# Patient Record
Sex: Female | Born: 1965 | Race: Black or African American | Hispanic: No | Marital: Married | Smoking: Former smoker
Health system: Southern US, Community
[De-identification: ages and names within clinical notes are randomized; demographics above are authoritative.]

## PROBLEM LIST (undated history)

## (undated) DIAGNOSIS — Z9221 Personal history of antineoplastic chemotherapy: Secondary | ICD-10-CM

## (undated) DIAGNOSIS — E119 Type 2 diabetes mellitus without complications: Secondary | ICD-10-CM

## (undated) DIAGNOSIS — N979 Female infertility, unspecified: Secondary | ICD-10-CM

## (undated) DIAGNOSIS — R002 Palpitations: Secondary | ICD-10-CM

## (undated) DIAGNOSIS — I10 Essential (primary) hypertension: Secondary | ICD-10-CM

## (undated) DIAGNOSIS — E7800 Pure hypercholesterolemia, unspecified: Secondary | ICD-10-CM

## (undated) DIAGNOSIS — D500 Iron deficiency anemia secondary to blood loss (chronic): Secondary | ICD-10-CM

## (undated) DIAGNOSIS — R000 Tachycardia, unspecified: Secondary | ICD-10-CM

## (undated) DIAGNOSIS — C8307 Small cell B-cell lymphoma, spleen: Secondary | ICD-10-CM

## (undated) DIAGNOSIS — I219 Acute myocardial infarction, unspecified: Secondary | ICD-10-CM

## (undated) DIAGNOSIS — C8590 Non-Hodgkin lymphoma, unspecified, unspecified site: Secondary | ICD-10-CM

## (undated) DIAGNOSIS — D630 Anemia in neoplastic disease: Secondary | ICD-10-CM

## (undated) DIAGNOSIS — D72829 Elevated white blood cell count, unspecified: Secondary | ICD-10-CM

## (undated) DIAGNOSIS — R5383 Other fatigue: Secondary | ICD-10-CM

## (undated) DIAGNOSIS — Z955 Presence of coronary angioplasty implant and graft: Secondary | ICD-10-CM

## (undated) HISTORY — DX: Iron deficiency anemia secondary to blood loss (chronic): D50.0

## (undated) HISTORY — DX: Palpitations: R00.2

## (undated) HISTORY — DX: Elevated white blood cell count, unspecified: D72.829

## (undated) HISTORY — PX: WISDOM TOOTH EXTRACTION: SHX21

## (undated) HISTORY — DX: Essential (primary) hypertension: I10

## (undated) HISTORY — PX: BONE MARROW BIOPSY: SHX199

## (undated) HISTORY — DX: Other fatigue: R53.83

## (undated) HISTORY — DX: Type 2 diabetes mellitus without complications: E11.9

## (undated) HISTORY — DX: Presence of coronary angioplasty implant and graft: Z95.5

## (undated) HISTORY — DX: Anemia in neoplastic disease: D63.0

## (undated) HISTORY — DX: Female infertility, unspecified: N97.9

## (undated) HISTORY — DX: Small cell b-cell lymphoma, spleen: C83.07

## (undated) HISTORY — DX: Tachycardia, unspecified: R00.0

---

## 2007-08-12 ENCOUNTER — Emergency Department (HOSPITAL_COMMUNITY): Admission: EM | Admit: 2007-08-12 | Discharge: 2007-08-12 | Payer: Self-pay | Admitting: Emergency Medicine

## 2007-08-12 ENCOUNTER — Ambulatory Visit: Payer: Self-pay | Admitting: Internal Medicine

## 2007-08-14 ENCOUNTER — Ambulatory Visit: Payer: Self-pay | Admitting: Infectious Disease

## 2007-08-14 ENCOUNTER — Inpatient Hospital Stay (HOSPITAL_COMMUNITY): Admission: EM | Admit: 2007-08-14 | Discharge: 2007-08-18 | Payer: Self-pay | Admitting: Emergency Medicine

## 2007-08-20 ENCOUNTER — Ambulatory Visit: Payer: Self-pay | Admitting: *Deleted

## 2007-08-20 ENCOUNTER — Encounter (INDEPENDENT_AMBULATORY_CARE_PROVIDER_SITE_OTHER): Payer: Self-pay | Admitting: Internal Medicine

## 2007-08-20 ENCOUNTER — Encounter (INDEPENDENT_AMBULATORY_CARE_PROVIDER_SITE_OTHER): Payer: Self-pay | Admitting: *Deleted

## 2007-08-20 ENCOUNTER — Ambulatory Visit: Payer: Self-pay | Admitting: Internal Medicine

## 2007-08-20 DIAGNOSIS — B009 Herpesviral infection, unspecified: Secondary | ICD-10-CM | POA: Insufficient documentation

## 2007-08-20 DIAGNOSIS — Z888 Allergy status to other drugs, medicaments and biological substances status: Secondary | ICD-10-CM | POA: Insufficient documentation

## 2007-08-20 DIAGNOSIS — E1165 Type 2 diabetes mellitus with hyperglycemia: Secondary | ICD-10-CM | POA: Insufficient documentation

## 2007-08-20 LAB — CONVERTED CEMR LAB
Blood Glucose, Fingerstick: 103
Blood Glucose, Home Monitor: 1 mg/dL
GC Probe Amp, Urine: NEGATIVE

## 2007-08-24 ENCOUNTER — Telehealth (INDEPENDENT_AMBULATORY_CARE_PROVIDER_SITE_OTHER): Payer: Self-pay | Admitting: *Deleted

## 2007-08-26 ENCOUNTER — Telehealth (INDEPENDENT_AMBULATORY_CARE_PROVIDER_SITE_OTHER): Payer: Self-pay | Admitting: *Deleted

## 2007-09-03 ENCOUNTER — Encounter (INDEPENDENT_AMBULATORY_CARE_PROVIDER_SITE_OTHER): Payer: Self-pay | Admitting: *Deleted

## 2008-02-17 ENCOUNTER — Other Ambulatory Visit: Admission: RE | Admit: 2008-02-17 | Discharge: 2008-02-17 | Payer: Self-pay | Admitting: Family Medicine

## 2008-05-30 ENCOUNTER — Encounter: Admission: RE | Admit: 2008-05-30 | Discharge: 2008-05-30 | Payer: Self-pay | Admitting: Family Medicine

## 2009-01-03 ENCOUNTER — Ambulatory Visit: Payer: Self-pay | Admitting: Oncology

## 2009-01-11 DIAGNOSIS — C8307 Small cell B-cell lymphoma, spleen: Secondary | ICD-10-CM | POA: Insufficient documentation

## 2009-01-11 HISTORY — DX: Small cell b-cell lymphoma, spleen: C83.07

## 2009-01-12 ENCOUNTER — Other Ambulatory Visit: Admission: RE | Admit: 2009-01-12 | Discharge: 2009-01-12 | Payer: Self-pay | Admitting: Oncology

## 2009-01-12 ENCOUNTER — Encounter: Payer: Self-pay | Admitting: Oncology

## 2009-01-12 LAB — LACTATE DEHYDROGENASE: LDH: 188 U/L (ref 94–250)

## 2009-01-12 LAB — CBC WITH DIFFERENTIAL/PLATELET
Basophils Absolute: 0 10*3/uL (ref 0.0–0.1)
EOS%: 0.6 % (ref 0.0–7.0)
HGB: 10 g/dL — ABNORMAL LOW (ref 11.6–15.9)
LYMPH%: 58.4 % — ABNORMAL HIGH (ref 14.0–49.7)
MCH: 22 pg — ABNORMAL LOW (ref 25.1–34.0)
MCV: 69.1 fL — ABNORMAL LOW (ref 79.5–101.0)
MONO%: 4.4 % (ref 0.0–14.0)
Platelets: 261 10*3/uL (ref 145–400)
RBC: 4.55 10*6/uL (ref 3.70–5.45)
RDW: 17.7 % — ABNORMAL HIGH (ref 11.2–14.5)

## 2009-01-12 LAB — COMPREHENSIVE METABOLIC PANEL
Alkaline Phosphatase: 79 U/L (ref 39–117)
BUN: 8 mg/dL (ref 6–23)
Creatinine, Ser: 0.68 mg/dL (ref 0.40–1.20)
Glucose, Bld: 153 mg/dL — ABNORMAL HIGH (ref 70–99)
Sodium: 141 mEq/L (ref 135–145)
Total Bilirubin: 0.4 mg/dL (ref 0.3–1.2)
Total Protein: 6.5 g/dL (ref 6.0–8.3)

## 2009-01-12 LAB — RETICULOCYTES: RBC: 4.53 10*6/uL (ref 3.70–5.45)

## 2009-01-12 LAB — ERYTHROCYTE SEDIMENTATION RATE: Sed Rate: 32 mm/hr — ABNORMAL HIGH (ref 0–30)

## 2009-01-16 LAB — FERRITIN: Ferritin: 18 ng/mL (ref 10–291)

## 2009-01-16 LAB — SPEP & IFE WITH QIG
Albumin ELP: 56.3 % (ref 55.8–66.1)
Alpha-1-Globulin: 5.7 % — ABNORMAL HIGH (ref 2.9–4.9)
Beta 2: 5 % (ref 3.2–6.5)
Beta Globulin: 7 % (ref 4.7–7.2)
IgM, Serum: 363 mg/dL — ABNORMAL HIGH (ref 60–263)

## 2009-01-16 LAB — IRON AND TIBC
TIBC: 320 ug/dL (ref 250–470)
UIBC: 308 ug/dL

## 2009-01-16 LAB — ANA: Anti Nuclear Antibody(ANA): NEGATIVE

## 2009-01-17 ENCOUNTER — Encounter: Payer: Self-pay | Admitting: Oncology

## 2009-01-17 ENCOUNTER — Other Ambulatory Visit: Admission: RE | Admit: 2009-01-17 | Discharge: 2009-01-17 | Payer: Self-pay | Admitting: Oncology

## 2009-01-17 LAB — CBC WITH DIFFERENTIAL/PLATELET
Basophils Absolute: 0.1 10*3/uL (ref 0.0–0.1)
EOS%: 0.9 % (ref 0.0–7.0)
HCT: 31.2 % — ABNORMAL LOW (ref 34.8–46.6)
HGB: 10 g/dL — ABNORMAL LOW (ref 11.6–15.9)
MCH: 21.8 pg — ABNORMAL LOW (ref 25.1–34.0)
NEUT%: 36.6 % — ABNORMAL LOW (ref 38.4–76.8)
lymph#: 10.5 10*3/uL — ABNORMAL HIGH (ref 0.9–3.3)

## 2009-01-17 LAB — URIC ACID: Uric Acid, Serum: 5.9 mg/dL (ref 2.4–7.0)

## 2009-01-19 ENCOUNTER — Ambulatory Visit (HOSPITAL_COMMUNITY): Admission: RE | Admit: 2009-01-19 | Discharge: 2009-01-19 | Payer: Self-pay | Admitting: Oncology

## 2009-01-22 ENCOUNTER — Encounter: Payer: Self-pay | Admitting: Oncology

## 2009-01-22 ENCOUNTER — Ambulatory Visit: Payer: Self-pay

## 2009-02-19 ENCOUNTER — Ambulatory Visit: Payer: Self-pay | Admitting: Oncology

## 2009-02-19 LAB — CBC WITH DIFFERENTIAL/PLATELET
Basophils Absolute: 0.1 10*3/uL (ref 0.0–0.1)
EOS%: 0.7 % (ref 0.0–7.0)
HCT: 29.9 % — ABNORMAL LOW (ref 34.8–46.6)
HGB: 9.5 g/dL — ABNORMAL LOW (ref 11.6–15.9)
MCH: 21.6 pg — ABNORMAL LOW (ref 25.1–34.0)
MCV: 68.3 fL — ABNORMAL LOW (ref 79.5–101.0)
MONO%: 4.9 % (ref 0.0–14.0)
NEUT%: 38.2 % — ABNORMAL LOW (ref 38.4–76.8)

## 2009-03-26 LAB — COMPREHENSIVE METABOLIC PANEL
Albumin: 4 g/dL (ref 3.5–5.2)
BUN: 14 mg/dL (ref 6–23)
CO2: 22 mEq/L (ref 19–32)
Calcium: 8.7 mg/dL (ref 8.4–10.5)
Chloride: 105 mEq/L (ref 96–112)
Glucose, Bld: 121 mg/dL — ABNORMAL HIGH (ref 70–99)
Potassium: 4.2 mEq/L (ref 3.5–5.3)
Total Protein: 6.6 g/dL (ref 6.0–8.3)

## 2009-03-26 LAB — CBC WITH DIFFERENTIAL/PLATELET
Basophils Absolute: 0.1 10*3/uL (ref 0.0–0.1)
Eosinophils Absolute: 0.1 10*3/uL (ref 0.0–0.5)
HGB: 10.8 g/dL — ABNORMAL LOW (ref 11.6–15.9)
MCV: 68.8 fL — ABNORMAL LOW (ref 79.5–101.0)
MONO#: 1 10*3/uL — ABNORMAL HIGH (ref 0.1–0.9)
NEUT#: 8 10*3/uL — ABNORMAL HIGH (ref 1.5–6.5)
RDW: 18.5 % — ABNORMAL HIGH (ref 11.2–14.5)
WBC: 20.4 10*3/uL — ABNORMAL HIGH (ref 3.9–10.3)
lymph#: 11.1 10*3/uL — ABNORMAL HIGH (ref 0.9–3.3)

## 2009-04-21 IMAGING — MG MM SCREEN MAMMOGRAM BILATERAL
6 series · 6 of 6 positions shown · non-contrast
Comparison: none

DG SCREEN MAMMOGRAM BILATERAL
Bilateral CC and MLO view(s) were taken.

DIGITAL SCREENING MAMMOGRAM WITH CAD:
There are scattered fibroglandular densities.  There is no dominant mass, architectural distortion 
or calcification to suggest malignancy.

[R CC]
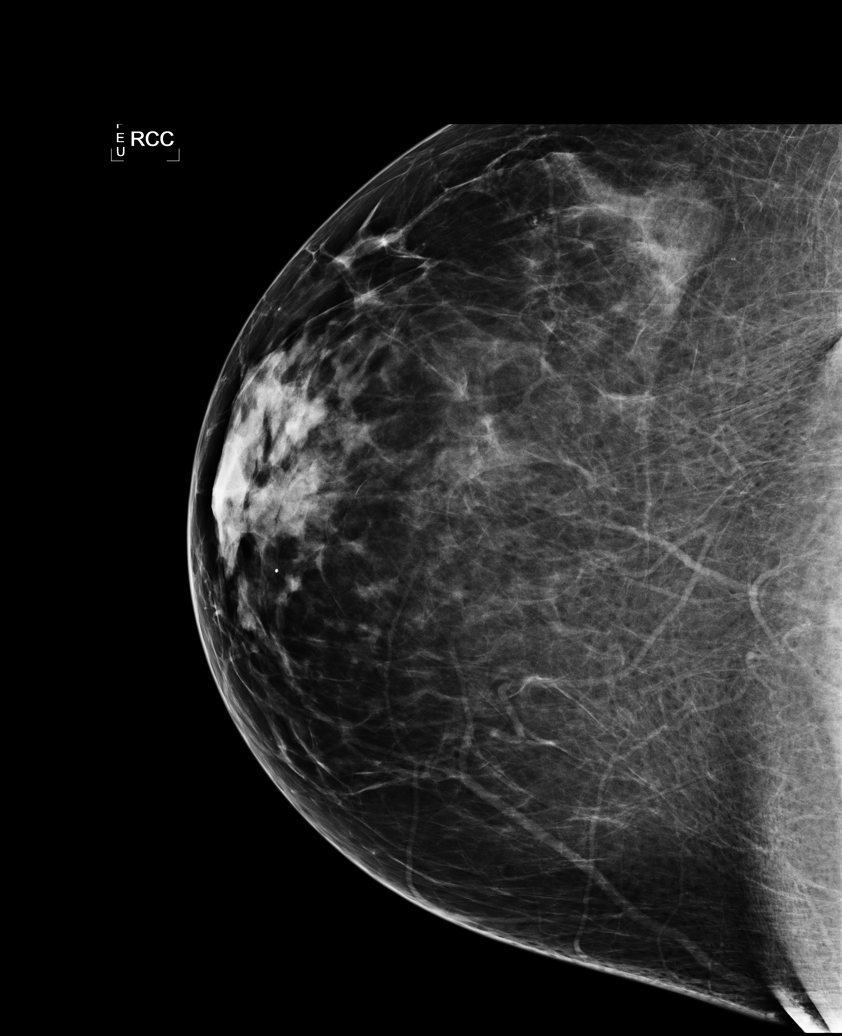

[L CC]
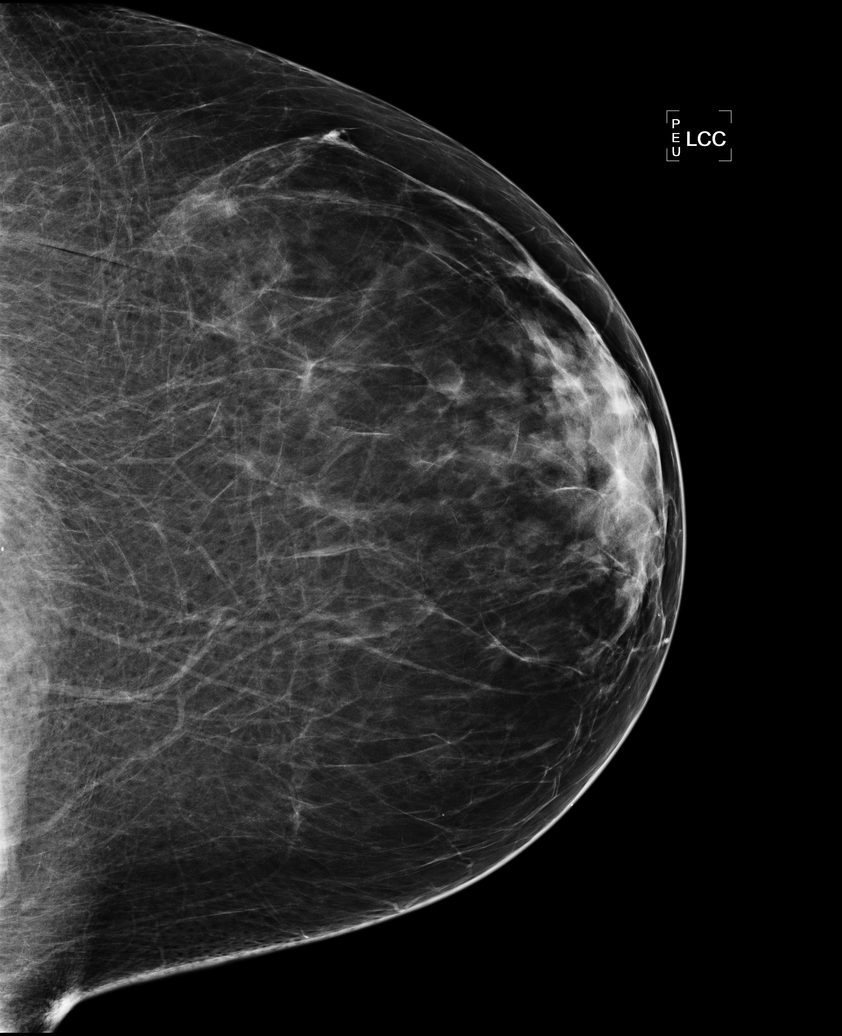

[L MLO (1 of 2)]
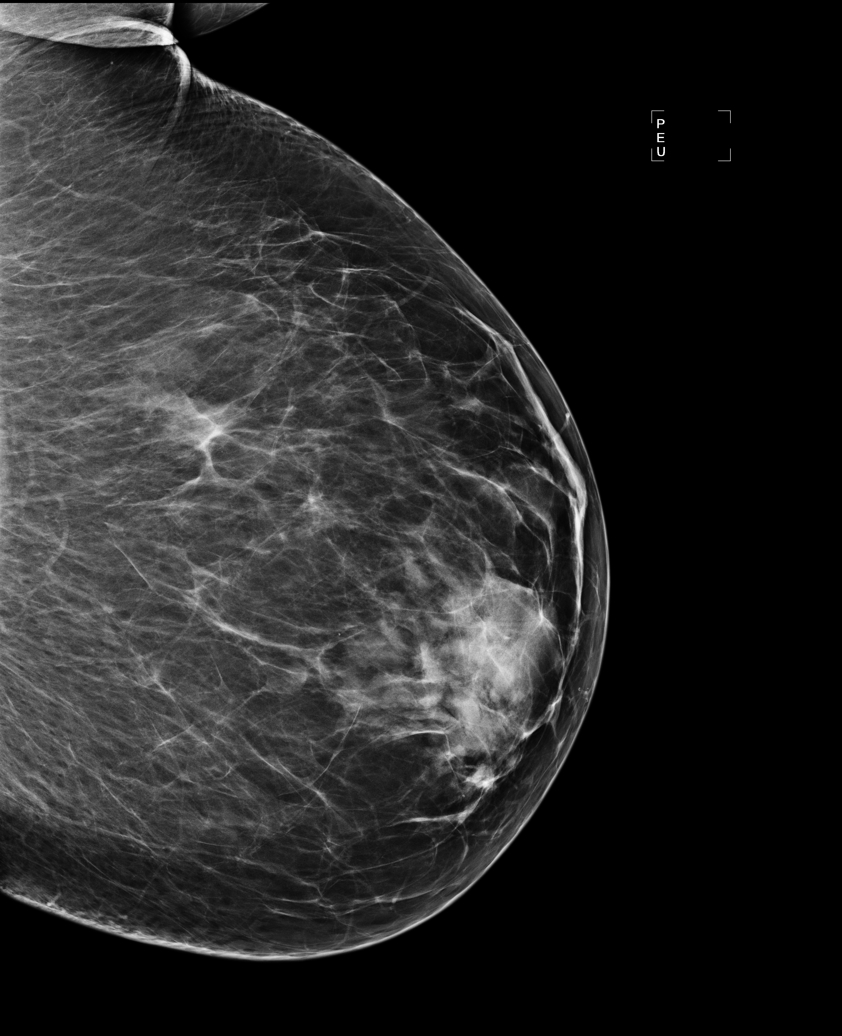

[R MLO (1 of 2)]
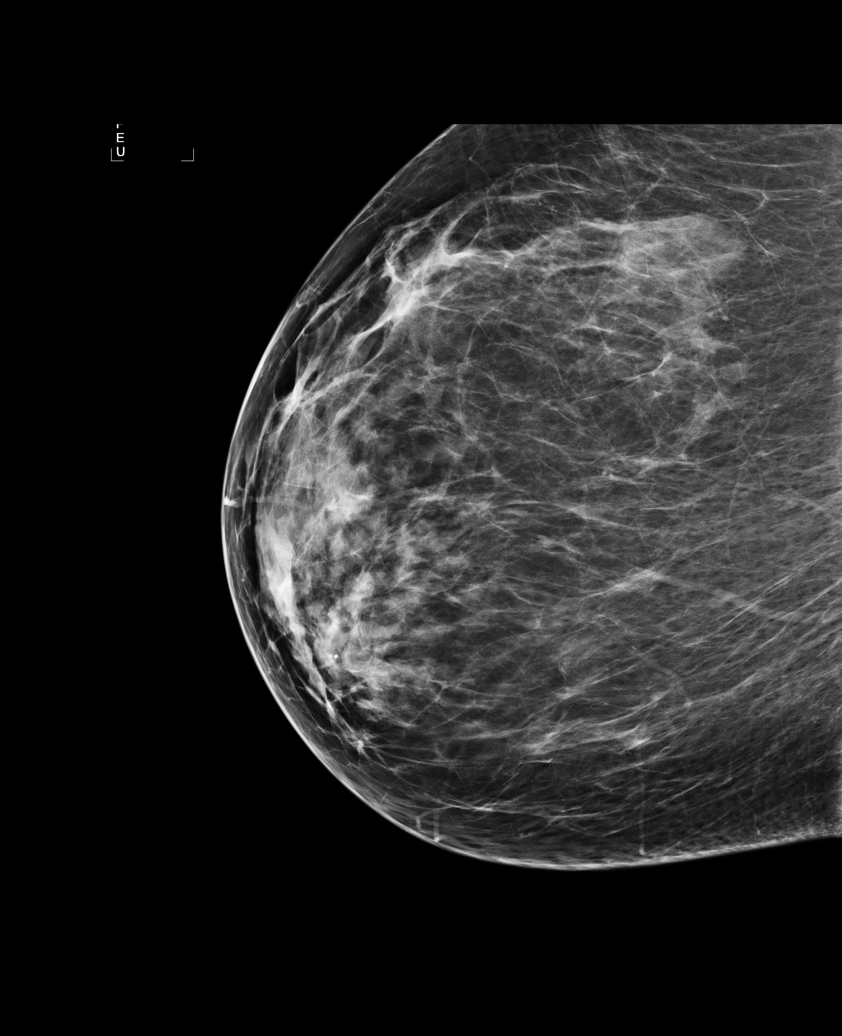

[R MLO (2 of 2)]
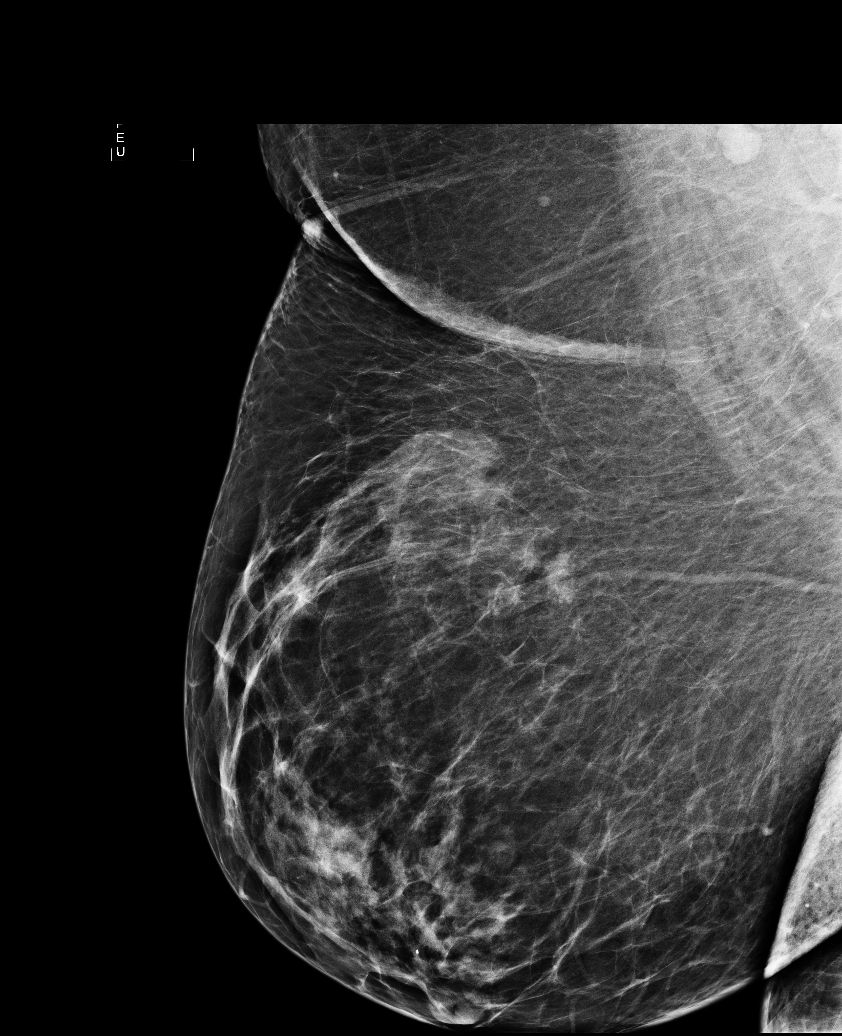

[L MLO (2 of 2)]
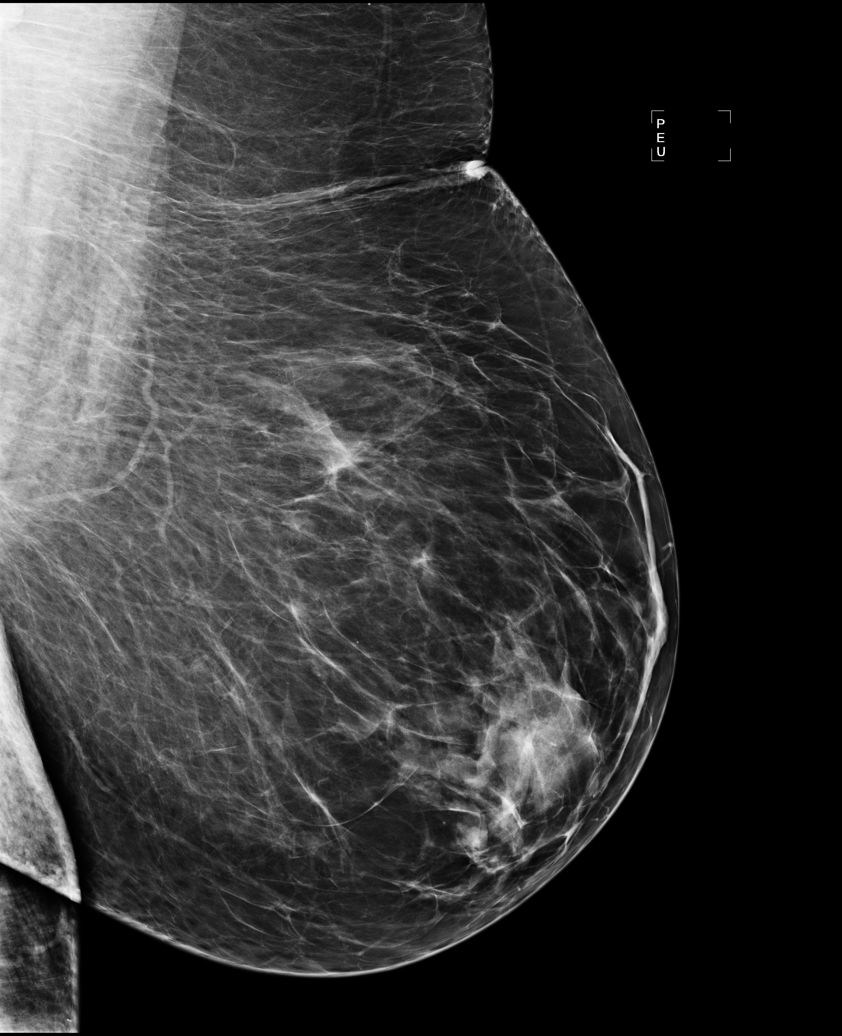

[6 of 6 positions shown; findings below may reference images not displayed]

IMPRESSION: No mammographic evidence of malignancy.  Suggest yearly screening mammography.

ASSESSMENT: Negative - BI-RADS 1

Screening mammogram in 1 year.
ANALYZED BY COMPUTER AIDED DETECTION. , THIS PROCEDURE WAS A DIGITAL MAMMOGRAM.

## 2009-05-21 ENCOUNTER — Ambulatory Visit: Payer: Self-pay | Admitting: Oncology

## 2009-05-23 LAB — CBC WITH DIFFERENTIAL/PLATELET
BASO%: 0.3 % (ref 0.0–2.0)
Basophils Absolute: 0.1 10*3/uL (ref 0.0–0.1)
EOS%: 0.4 % (ref 0.0–7.0)
HGB: 10.1 g/dL — ABNORMAL LOW (ref 11.6–15.9)
LYMPH%: 62.6 % — ABNORMAL HIGH (ref 14.0–49.7)
MCH: 22.2 pg — ABNORMAL LOW (ref 25.1–34.0)
MCV: 69.1 fL — ABNORMAL LOW (ref 79.5–101.0)
RDW: 17.1 % — ABNORMAL HIGH (ref 11.2–14.5)
lymph#: 14.6 10*3/uL — ABNORMAL HIGH (ref 0.9–3.3)

## 2009-06-25 ENCOUNTER — Ambulatory Visit: Payer: Self-pay | Admitting: Oncology

## 2009-06-27 LAB — CBC WITH DIFFERENTIAL/PLATELET
BASO%: 0.3 % (ref 0.0–2.0)
EOS%: 0.5 % (ref 0.0–7.0)
HCT: 31.2 % — ABNORMAL LOW (ref 34.8–46.6)
LYMPH%: 63.3 % — ABNORMAL HIGH (ref 14.0–49.7)
MCH: 22.3 pg — ABNORMAL LOW (ref 25.1–34.0)
MCHC: 32.5 g/dL (ref 31.5–36.0)
MONO#: 1.1 10*3/uL — ABNORMAL HIGH (ref 0.1–0.9)
NEUT%: 30.8 % — ABNORMAL LOW (ref 38.4–76.8)
Platelets: 246 10*3/uL (ref 145–400)
RBC: 4.55 10*6/uL (ref 3.70–5.45)
WBC: 20.6 10*3/uL — ABNORMAL HIGH (ref 3.9–10.3)
lymph#: 13.1 10*3/uL — ABNORMAL HIGH (ref 0.9–3.3)

## 2009-06-27 LAB — COMPREHENSIVE METABOLIC PANEL
ALT: 15 U/L (ref 0–35)
AST: 16 U/L (ref 0–37)
CO2: 28 mEq/L (ref 19–32)
Creatinine, Ser: 0.63 mg/dL (ref 0.40–1.20)
Sodium: 139 mEq/L (ref 135–145)
Total Bilirubin: 0.5 mg/dL (ref 0.3–1.2)
Total Protein: 6.6 g/dL (ref 6.0–8.3)

## 2009-06-27 LAB — LACTATE DEHYDROGENASE: LDH: 172 U/L (ref 94–250)

## 2009-06-27 LAB — FERRITIN: Ferritin: 23 ng/mL (ref 10–291)

## 2009-08-14 ENCOUNTER — Encounter: Admission: RE | Admit: 2009-08-14 | Discharge: 2009-08-14 | Payer: Self-pay | Admitting: Family Medicine

## 2009-08-27 ENCOUNTER — Ambulatory Visit: Payer: Self-pay | Admitting: Oncology

## 2009-08-29 LAB — CBC WITH DIFFERENTIAL/PLATELET
BASO%: 0.3 % (ref 0.0–2.0)
HCT: 32.1 % — ABNORMAL LOW (ref 34.8–46.6)
LYMPH%: 67.3 % — ABNORMAL HIGH (ref 14.0–49.7)
MCH: 22.1 pg — ABNORMAL LOW (ref 25.1–34.0)
MCHC: 31.8 g/dL (ref 31.5–36.0)
MCV: 69.4 fL — ABNORMAL LOW (ref 79.5–101.0)
MONO%: 5.3 % (ref 0.0–14.0)
NEUT%: 26.8 % — ABNORMAL LOW (ref 38.4–76.8)
Platelets: 249 10*3/uL (ref 145–400)
RBC: 4.63 10*6/uL (ref 3.70–5.45)

## 2009-10-25 ENCOUNTER — Ambulatory Visit: Payer: Self-pay | Admitting: Oncology

## 2009-10-30 LAB — COMPREHENSIVE METABOLIC PANEL
BUN: 12 mg/dL (ref 6–23)
CO2: 26 mEq/L (ref 19–32)
Calcium: 9.2 mg/dL (ref 8.4–10.5)
Chloride: 102 mEq/L (ref 96–112)
Creatinine, Ser: 0.7 mg/dL (ref 0.40–1.20)
Glucose, Bld: 114 mg/dL — ABNORMAL HIGH (ref 70–99)
Total Bilirubin: 0.7 mg/dL (ref 0.3–1.2)

## 2009-10-30 LAB — CBC WITH DIFFERENTIAL/PLATELET
Basophils Absolute: 0.1 10*3/uL (ref 0.0–0.1)
HCT: 32.4 % — ABNORMAL LOW (ref 34.8–46.6)
HGB: 10.4 g/dL — ABNORMAL LOW (ref 11.6–15.9)
LYMPH%: 64.8 % — ABNORMAL HIGH (ref 14.0–49.7)
MONO#: 1.4 10*3/uL — ABNORMAL HIGH (ref 0.1–0.9)
NEUT%: 29.3 % — ABNORMAL LOW (ref 38.4–76.8)
Platelets: 250 10*3/uL (ref 145–400)
WBC: 26.5 10*3/uL — ABNORMAL HIGH (ref 3.9–10.3)
lymph#: 17.1 10*3/uL — ABNORMAL HIGH (ref 0.9–3.3)

## 2009-12-11 IMAGING — CT CT PELVIS W/ CM
2 of 4 series · 17 of 46 positions shown, 19 images · IV contrast (agent unspecified)
Comparison: None.

CT CHEST

CLINICAL DATA: Lymphoma.  Leukocytosis.  Recent left iliac bone
marrow biopsy.

CT CHEST, ABDOMEN AND PELVIS WITH CONTRAST
TECHNIQUE: Multidetector CT imaging of the chest, abdomen and
pelvis was performed following the standard protocol during bolus
administration of intravenous contrast.
Contrast: 100 ml [id number]

[Series 2: cap xxl st · axial · 0.68mm/px · z∈[+683,+1293]mm · 14 of 134 slices shown, 16 images]
[im 6/134  soft-tissue]
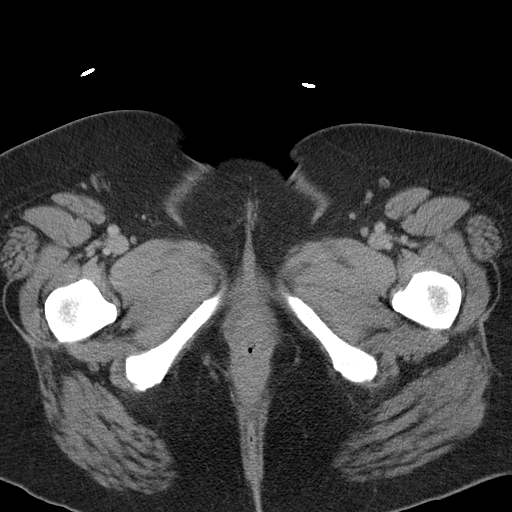
[im 6/134  bone]
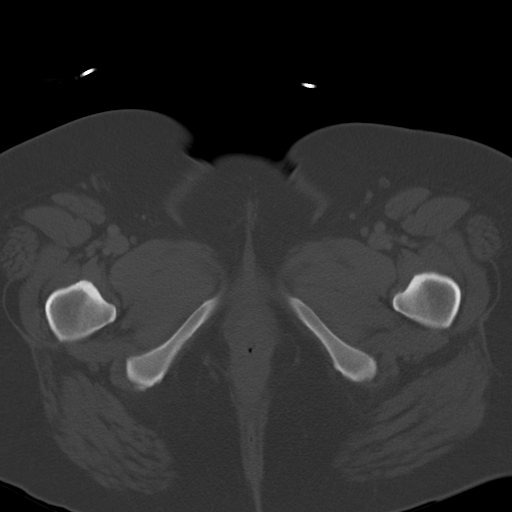
[im 17/134  soft-tissue]
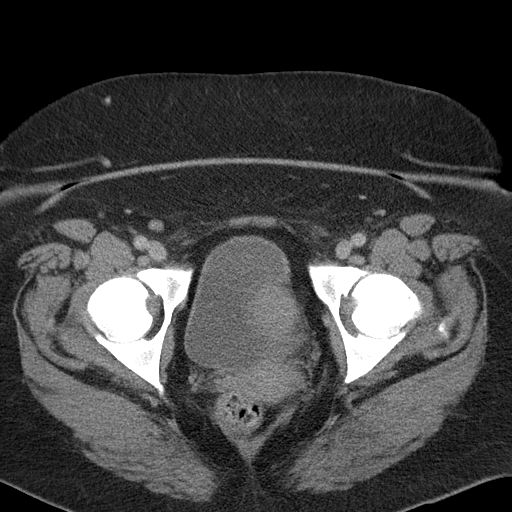
[im 28/134  soft-tissue]
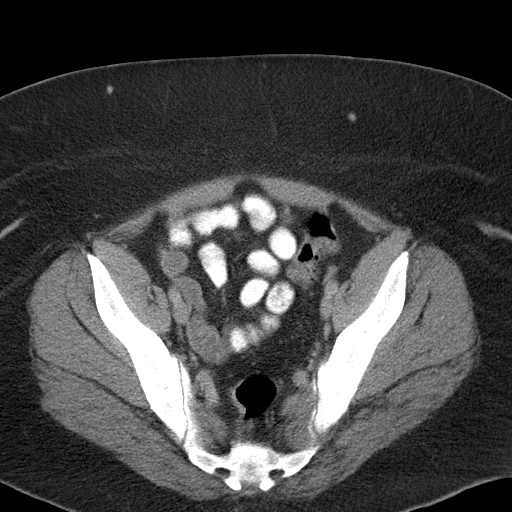
[im 34/134  soft-tissue]
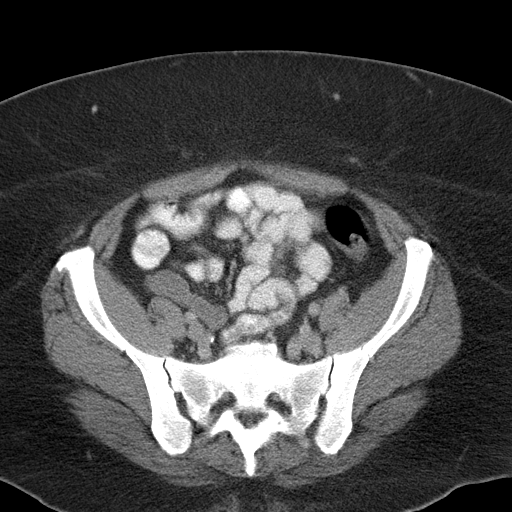
[im 45/134  soft-tissue]
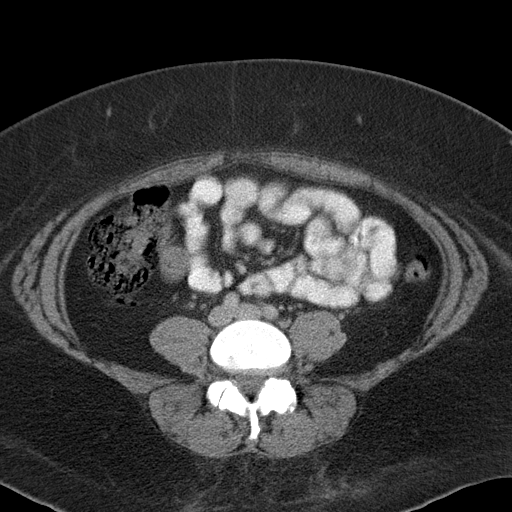
[im 56/134  soft-tissue]
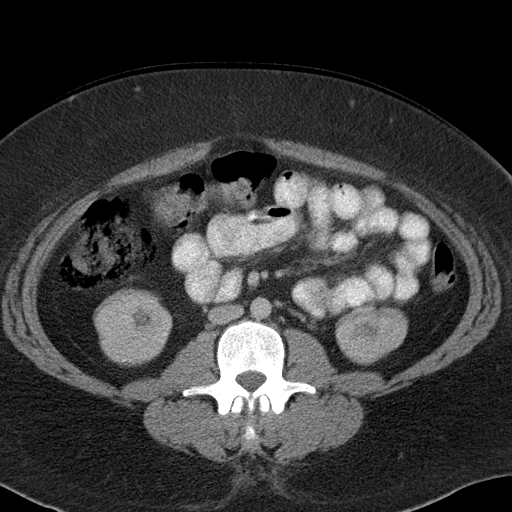
[im 61/134  soft-tissue]
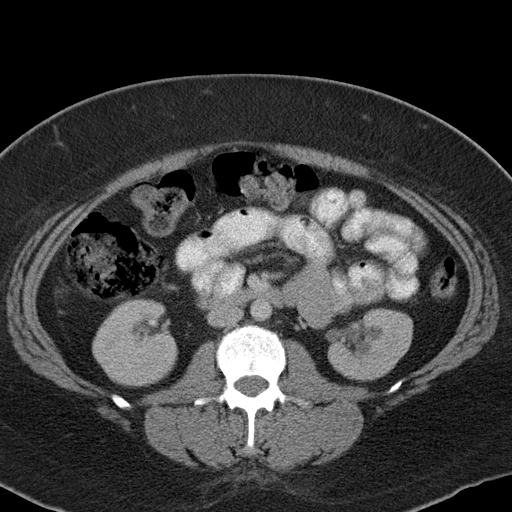
[im 73/134  soft-tissue]
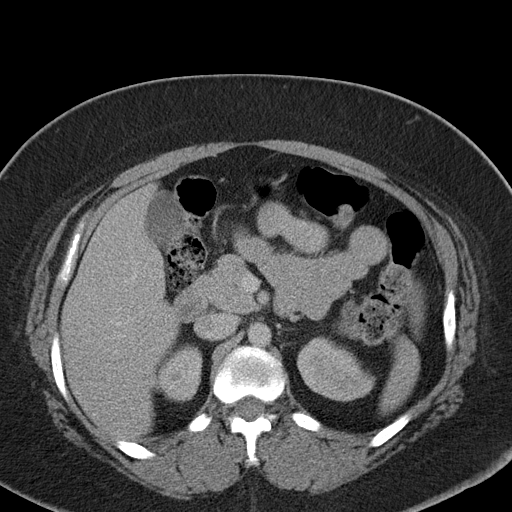
[im 78/134  soft-tissue]
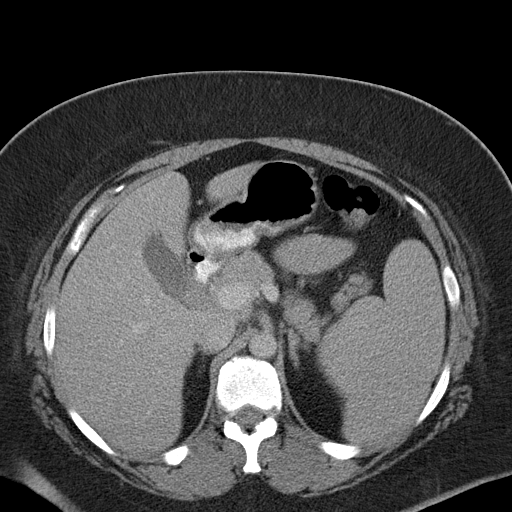
[im 78/134  bone]
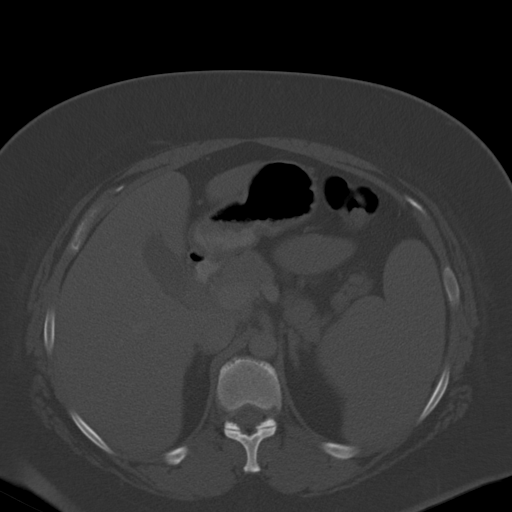
[im 89/134  soft-tissue]
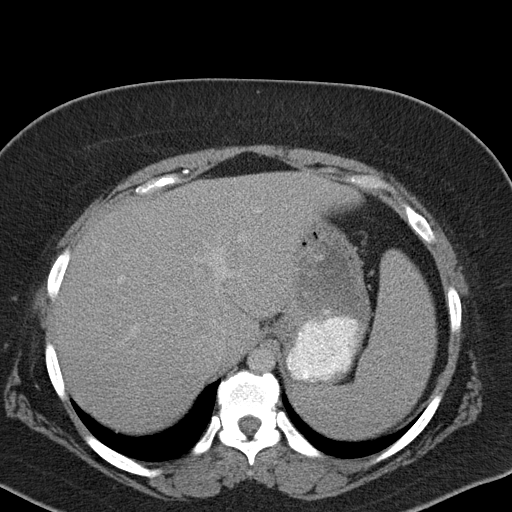
[im 100/134  soft-tissue]
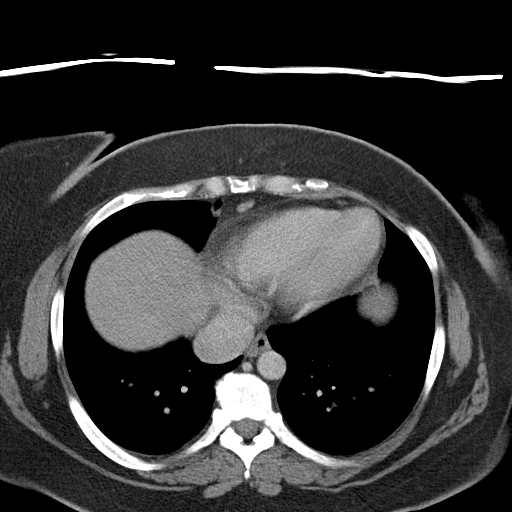
[im 106/134  soft-tissue]
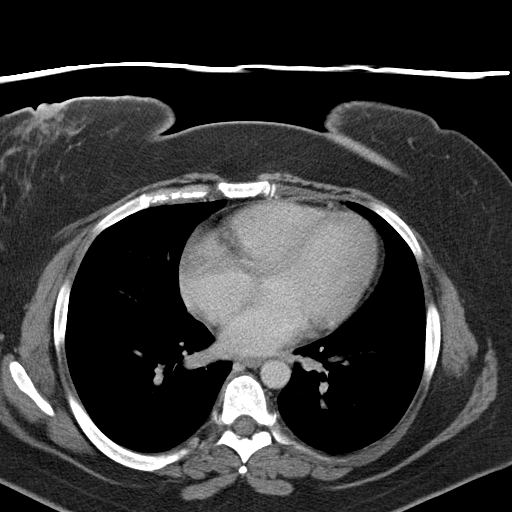
[im 117/134  soft-tissue]
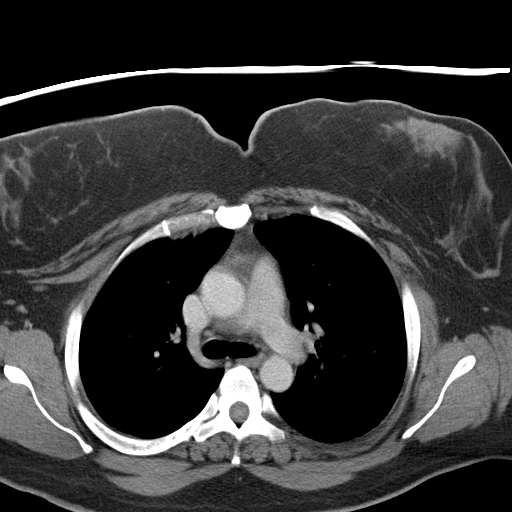
[im 128/134  soft-tissue]
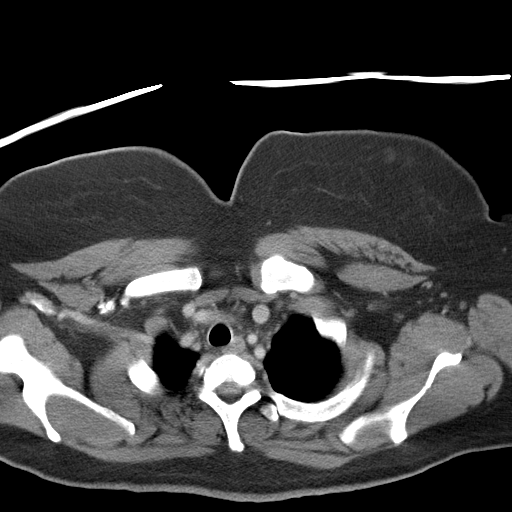

[Series 602: <mpr thick range> · coronal · 1.30mm/px · 3 of 97 slices shown]
[im 33/97  soft-tissue]
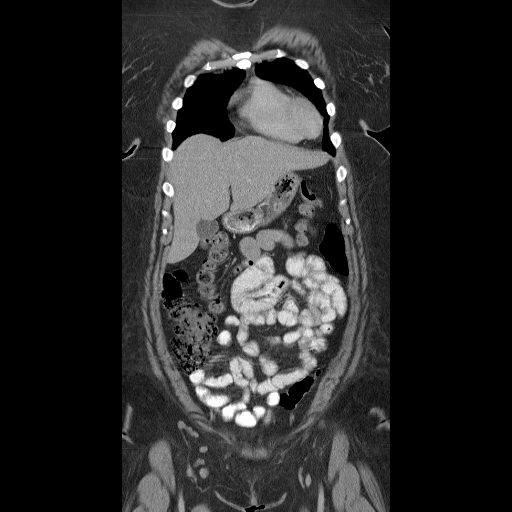
[im 43/97  soft-tissue]
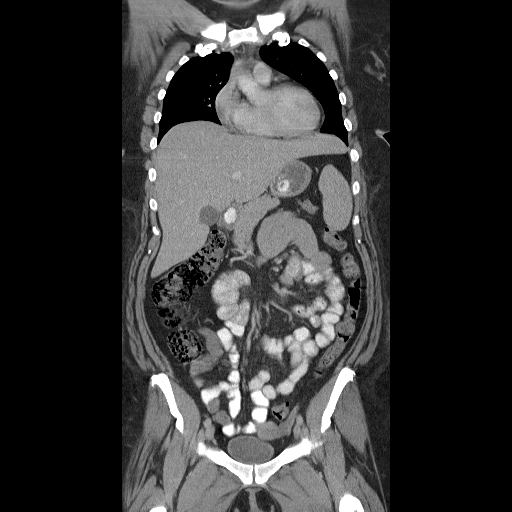
[im 54/97  soft-tissue]
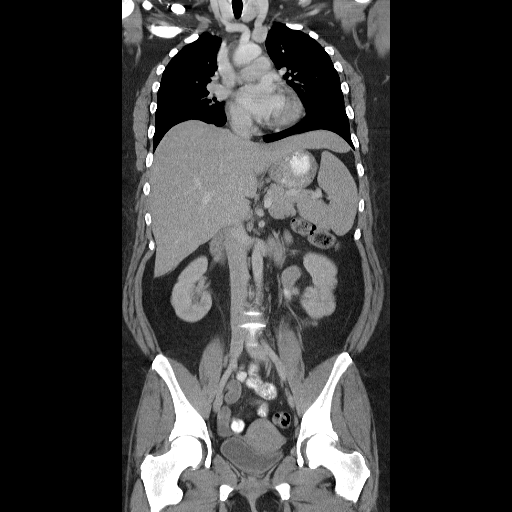

[17 of 46 positions shown; findings below may reference images not displayed]

FINDINGS: Left lobe of the thyroid appears mildly heterogeneous.
No pathologically enlarged mediastinal, hilar or axillary lymph
nodes.  Heart is at the upper limits normal in size.  No
pericardial effusion.

There is minimal subpleural atelectasis and/or scarring in the
right lower lobe.  Lungs otherwise clear.  No pleural fluid.
Airway is unremarkable.
IMPRESSION: 1.  No evidence of lymphoma in the chest.
2.  Question mild heterogeneity of the left lobe of the thyroid.
If further evaluation is desired, ultrasound could be performed.

CT ABDOMEN
FINDINGS: Liver, gallbladder, adrenal glands and kidneys are
unremarkable.  Spleen is at the upper limits of normal in size,
measuring 12.2 cm.  Appears prominent in the anterior and posterior
dimensions, where it measures 15.4 cm.  Pancreas, stomach and small
bowel are unremarkable.  No pathologically enlarged lymph nodes.
IMPRESSION: Splenomegaly.

CT PELVIS
FINDINGS: Uterus and ovaries are visualized.  Colon and appendix
are unremarkable.  No pathologically enlarged lymph nodes.  Largest
lymph node is seen in the right common iliac chain, measuring 6 mm
short axis.  No free fluid.  No worrisome lytic or sclerotic
lesions.
IMPRESSION: No evidence of lymphoma in the pelvis.

## 2010-02-26 ENCOUNTER — Ambulatory Visit: Payer: Self-pay | Admitting: Oncology

## 2010-02-27 LAB — CBC WITH DIFFERENTIAL/PLATELET
BASO%: 0.3 % (ref 0.0–2.0)
Basophils Absolute: 0.1 10*3/uL (ref 0.0–0.1)
EOS%: 0.4 % (ref 0.0–7.0)
HCT: 30.2 % — ABNORMAL LOW (ref 34.8–46.6)
HGB: 9.7 g/dL — ABNORMAL LOW (ref 11.6–15.9)
LYMPH%: 70.9 % — ABNORMAL HIGH (ref 14.0–49.7)
MCH: 22.3 pg — ABNORMAL LOW (ref 25.1–34.0)
MCHC: 32.3 g/dL (ref 31.5–36.0)
MCV: 69.1 fL — ABNORMAL LOW (ref 79.5–101.0)
NEUT%: 23.3 % — ABNORMAL LOW (ref 38.4–76.8)
Platelets: 232 10*3/uL (ref 145–400)
lymph#: 24.8 10*3/uL — ABNORMAL HIGH (ref 0.9–3.3)

## 2010-02-27 LAB — IRON AND TIBC
%SAT: 8 % — ABNORMAL LOW (ref 20–55)
Iron: 25 ug/dL — ABNORMAL LOW (ref 42–145)
TIBC: 327 ug/dL (ref 250–470)

## 2010-02-27 LAB — COMPREHENSIVE METABOLIC PANEL
AST: 13 U/L (ref 0–37)
BUN: 14 mg/dL (ref 6–23)
CO2: 24 mEq/L (ref 19–32)
Calcium: 8.8 mg/dL (ref 8.4–10.5)
Chloride: 103 mEq/L (ref 96–112)
Creatinine, Ser: 0.75 mg/dL (ref 0.40–1.20)
Total Bilirubin: 0.3 mg/dL (ref 0.3–1.2)

## 2010-02-27 LAB — FERRITIN: Ferritin: 24 ng/mL (ref 10–291)

## 2010-05-27 ENCOUNTER — Other Ambulatory Visit: Admission: RE | Admit: 2010-05-27 | Discharge: 2010-05-27 | Payer: Self-pay | Admitting: Family Medicine

## 2010-05-28 ENCOUNTER — Ambulatory Visit: Payer: Self-pay | Admitting: Oncology

## 2010-05-30 LAB — COMPREHENSIVE METABOLIC PANEL
ALT: 17 U/L (ref 0–35)
AST: 18 U/L (ref 0–37)
Alkaline Phosphatase: 71 U/L (ref 39–117)
CO2: 28 mEq/L (ref 19–32)
Creatinine, Ser: 0.65 mg/dL (ref 0.40–1.20)
Sodium: 141 mEq/L (ref 135–145)
Total Bilirubin: 0.6 mg/dL (ref 0.3–1.2)
Total Protein: 6.4 g/dL (ref 6.0–8.3)

## 2010-05-30 LAB — CHCC SMEAR

## 2010-05-30 LAB — CBC WITH DIFFERENTIAL/PLATELET
BASO%: 0.3 % (ref 0.0–2.0)
Basophils Absolute: 0.1 10*3/uL (ref 0.0–0.1)
HCT: 32.4 % — ABNORMAL LOW (ref 34.8–46.6)
HGB: 10.9 g/dL — ABNORMAL LOW (ref 11.6–15.9)
LYMPH%: 69.4 % — ABNORMAL HIGH (ref 14.0–49.7)
MCHC: 33.7 g/dL (ref 31.5–36.0)
MONO#: 1.1 10*3/uL — ABNORMAL HIGH (ref 0.1–0.9)
NEUT%: 25.6 % — ABNORMAL LOW (ref 38.4–76.8)
Platelets: 200 10*3/uL (ref 145–400)
WBC: 24 10*3/uL — ABNORMAL HIGH (ref 3.9–10.3)
lymph#: 16.7 10*3/uL — ABNORMAL HIGH (ref 0.9–3.3)

## 2010-05-30 LAB — MORPHOLOGY: PLT EST: ADEQUATE

## 2010-05-30 LAB — LACTATE DEHYDROGENASE: LDH: 188 U/L (ref 94–250)

## 2010-07-06 IMAGING — MG MM SCREEN MAMMOGRAM BILATERAL
6 series · 6 of 6 positions shown · non-contrast
Comparison: Prior studies.

DG SCREEN MAMMOGRAM BILATERAL
Bilateral CC and MLO view(s) were taken.
Prior study comparison: May 30, 2008, DG screen mammogram bilateral.

DIGITAL SCREENING MAMMOGRAM WITH CAD:

[R CC]
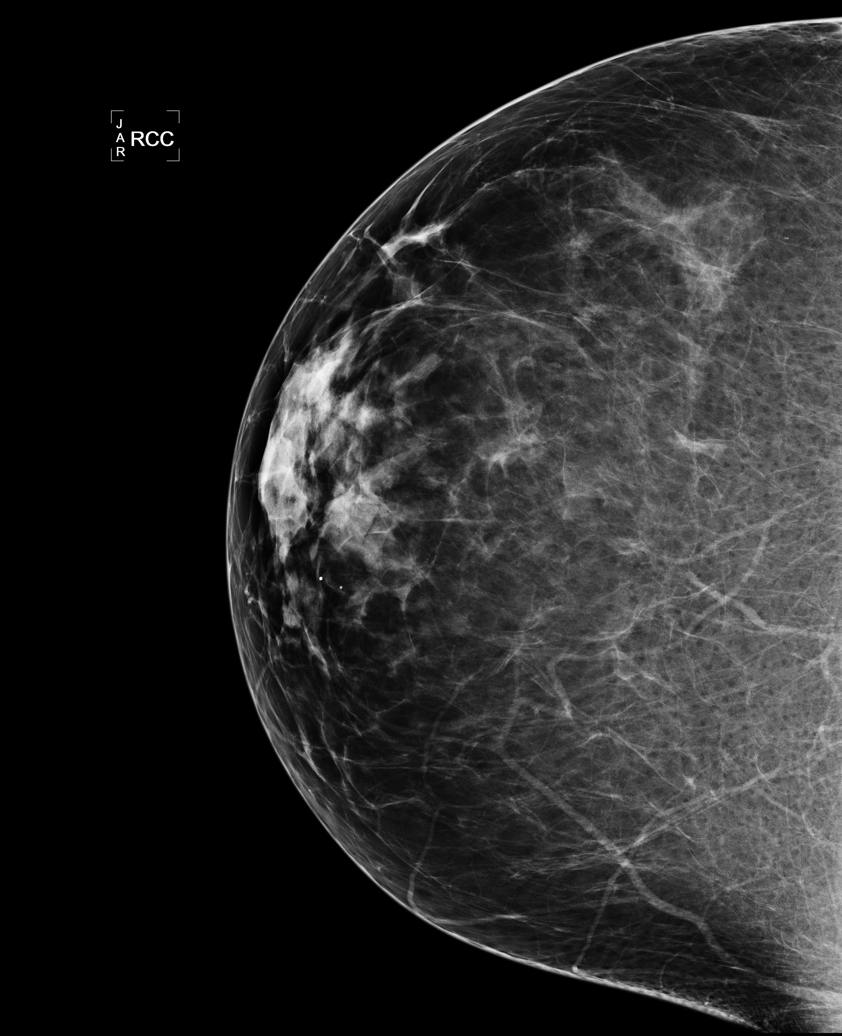

[L CC]
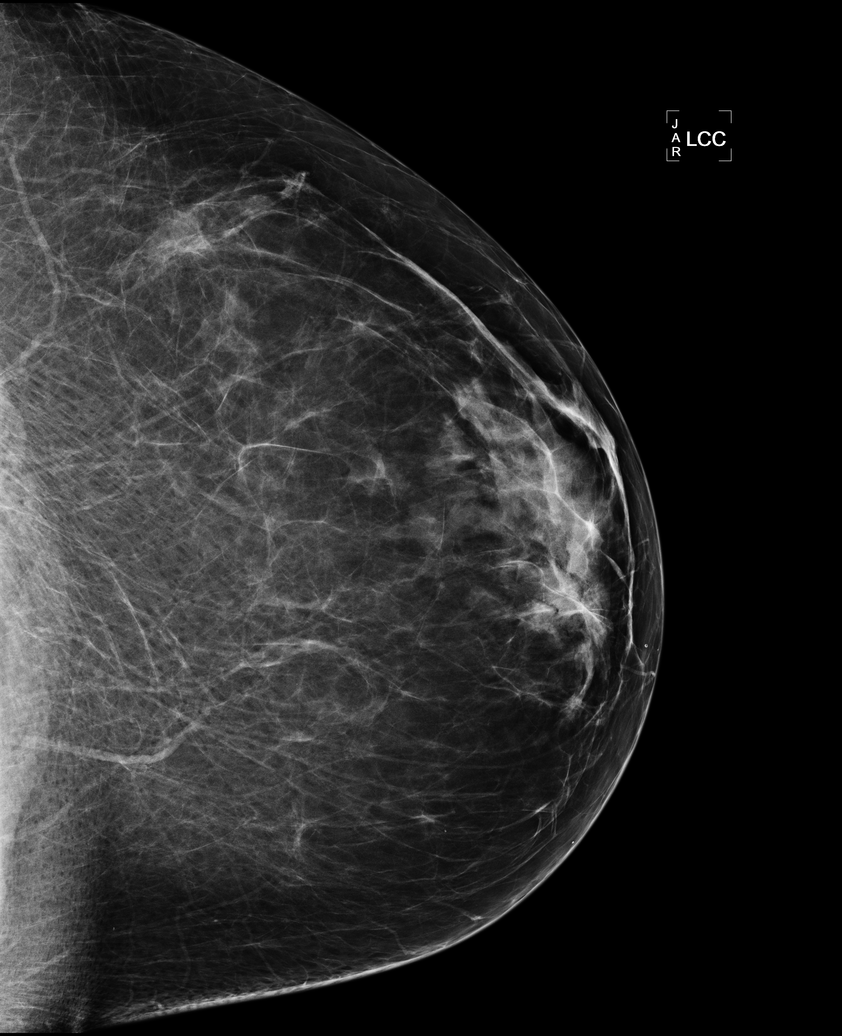

[L MLO (1 of 2)]
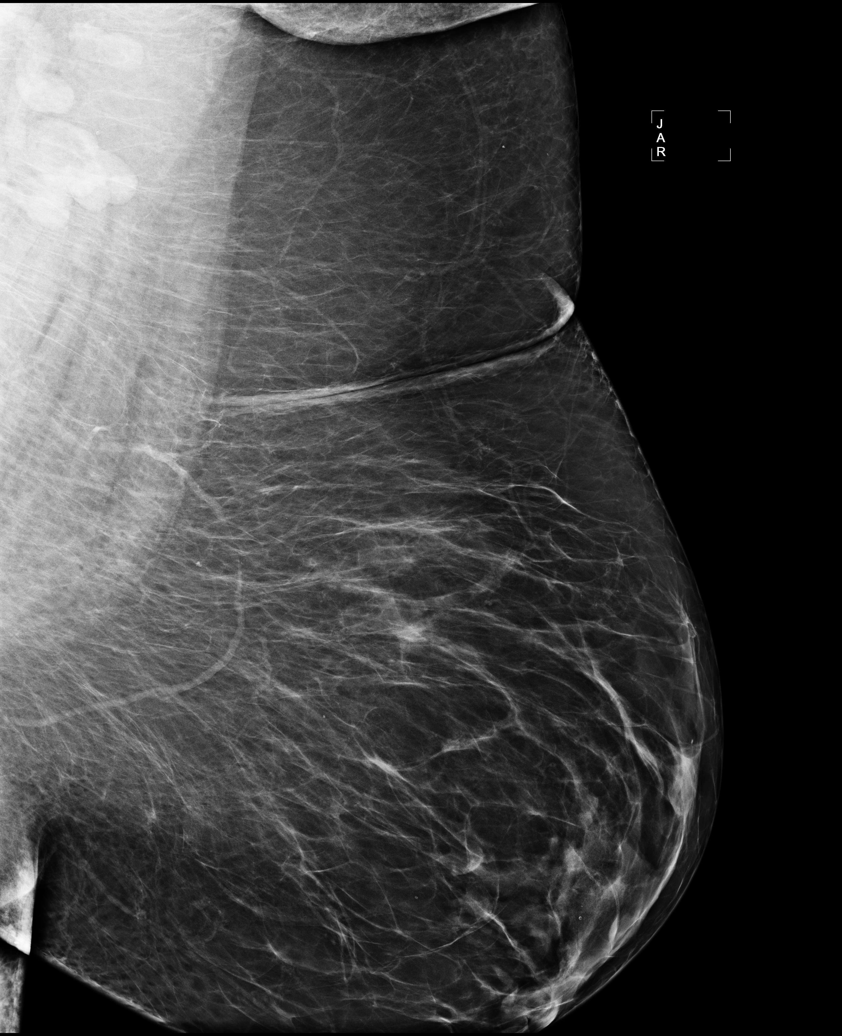

[R MLO (1 of 2)]
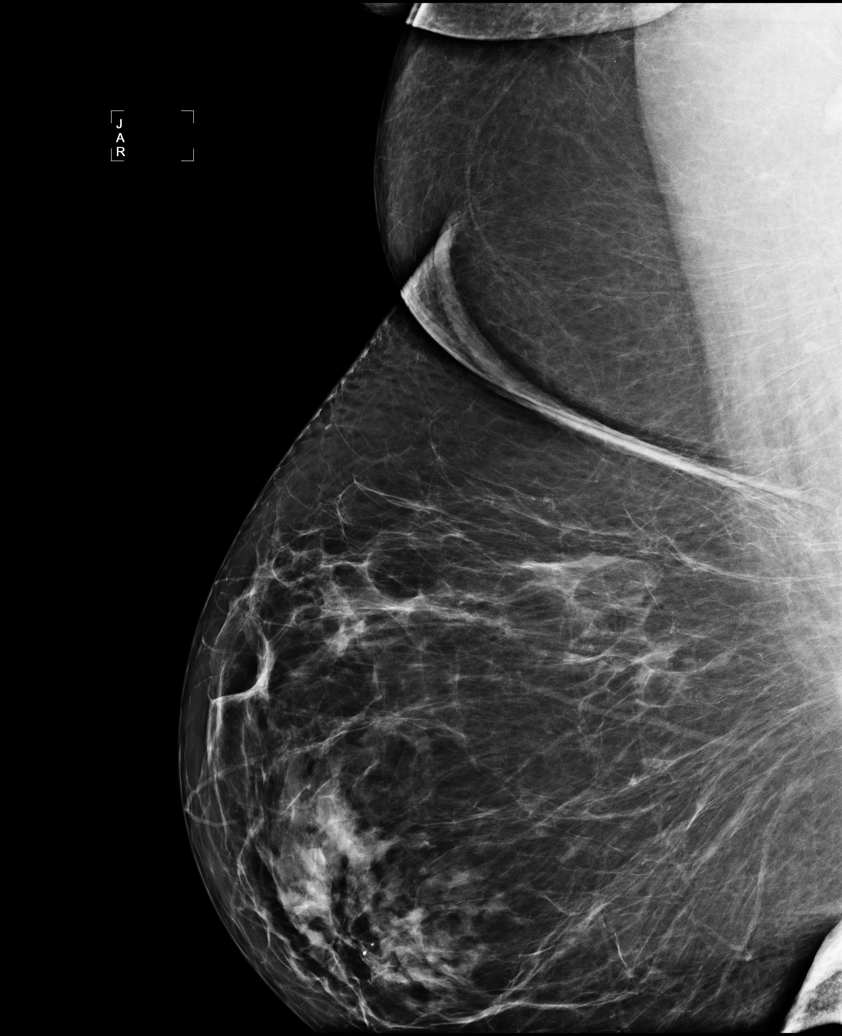

[L MLO (2 of 2)]
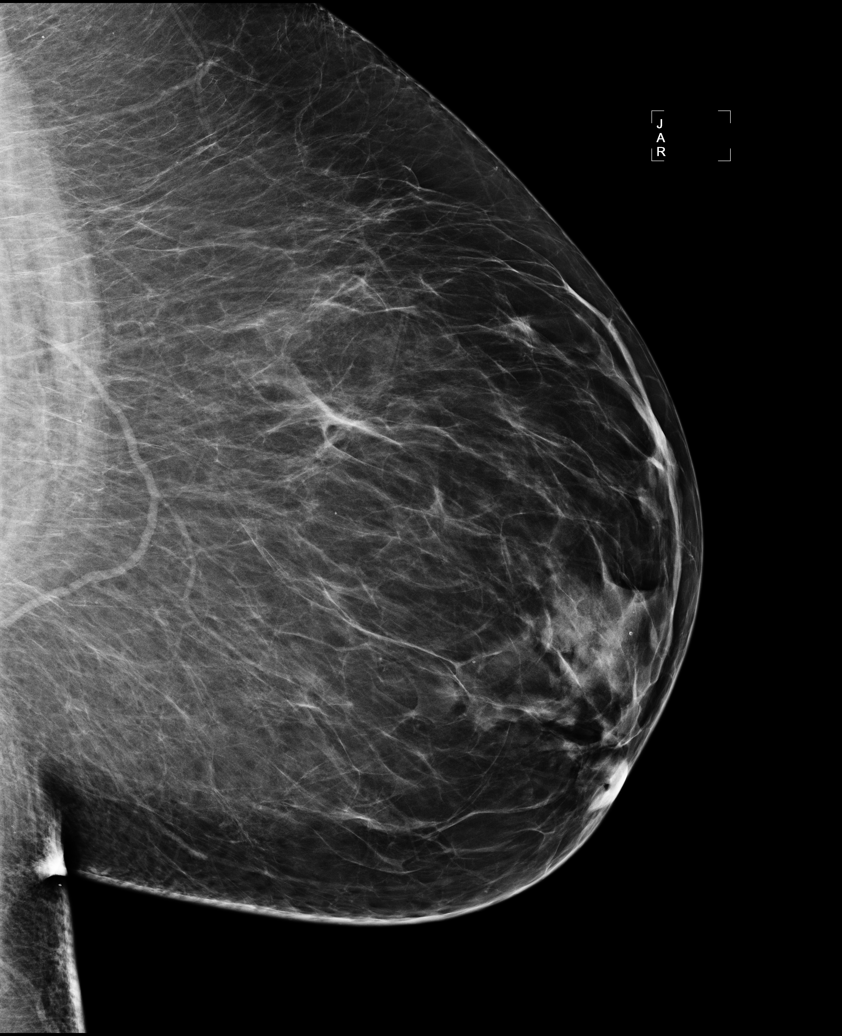

[R MLO (2 of 2)]
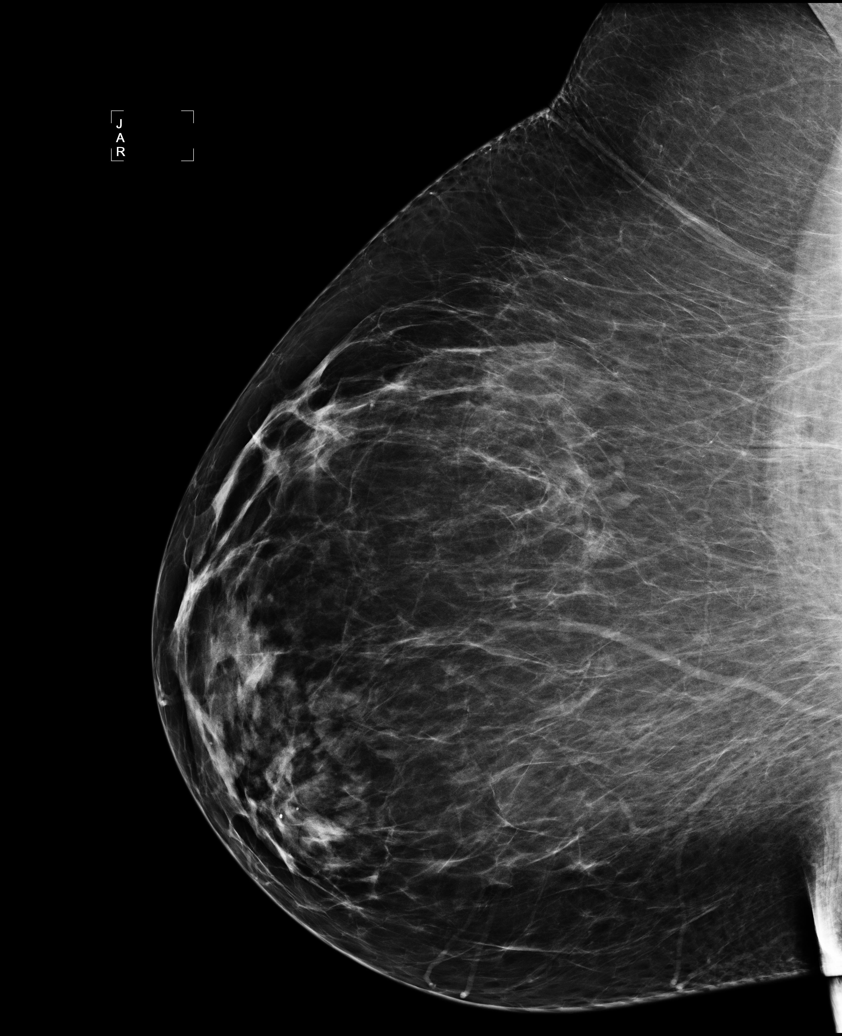

[6 of 6 positions shown; findings below may reference images not displayed]

There are scattered fibroglandular densities.  There is no dominant mass, architectural distortion 
or calcification to suggest malignancy.

Images were processed with CAD.
IMPRESSION: No mammographic evidence of malignancy.  Suggest yearly screening mammography.

A result letter of this screening mammogram will be mailed directly to the patient.

ASSESSMENT: Negative - BI-RADS 1

Screening mammogram in 1 year.
,

## 2010-08-28 ENCOUNTER — Ambulatory Visit: Payer: Self-pay | Admitting: Oncology

## 2010-08-30 LAB — CBC WITH DIFFERENTIAL/PLATELET
BASO%: 0.1 % (ref 0.0–2.0)
EOS%: 0.3 % (ref 0.0–7.0)
LYMPH%: 74.8 % — ABNORMAL HIGH (ref 14.0–49.7)
MCHC: 31 g/dL — ABNORMAL LOW (ref 31.5–36.0)
MCV: 76.8 fL — ABNORMAL LOW (ref 79.5–101.0)
MONO%: 6.1 % (ref 0.0–14.0)
Platelets: 168 10*3/uL (ref 145–400)
RBC: 4.36 10*6/uL (ref 3.70–5.45)
RDW: 16 % — ABNORMAL HIGH (ref 11.2–14.5)

## 2010-08-30 LAB — IRON AND TIBC
%SAT: 10 % — ABNORMAL LOW (ref 20–55)
TIBC: 267 ug/dL (ref 250–470)

## 2010-08-30 LAB — COMPREHENSIVE METABOLIC PANEL
ALT: 10 U/L (ref 0–35)
AST: 12 U/L (ref 0–37)
Alkaline Phosphatase: 83 U/L (ref 39–117)
Sodium: 138 mEq/L (ref 135–145)
Total Bilirubin: 0.4 mg/dL (ref 0.3–1.2)
Total Protein: 6 g/dL (ref 6.0–8.3)

## 2010-08-30 LAB — FERRITIN: Ferritin: 70 ng/mL (ref 10–291)

## 2010-09-20 ENCOUNTER — Encounter
Admission: RE | Admit: 2010-09-20 | Discharge: 2010-09-20 | Payer: Self-pay | Source: Home / Self Care | Attending: Family Medicine | Admitting: Family Medicine

## 2010-10-01 ENCOUNTER — Encounter
Admission: RE | Admit: 2010-10-01 | Discharge: 2010-10-01 | Payer: Self-pay | Source: Home / Self Care | Attending: Family Medicine | Admitting: Family Medicine

## 2010-11-03 ENCOUNTER — Encounter: Payer: Self-pay | Admitting: Family Medicine

## 2010-11-03 ENCOUNTER — Encounter: Payer: Self-pay | Admitting: Oncology

## 2010-11-29 ENCOUNTER — Other Ambulatory Visit: Payer: Self-pay | Admitting: Medical

## 2010-11-29 ENCOUNTER — Encounter (HOSPITAL_BASED_OUTPATIENT_CLINIC_OR_DEPARTMENT_OTHER): Payer: BC Managed Care – PPO | Admitting: Oncology

## 2010-11-29 DIAGNOSIS — N920 Excessive and frequent menstruation with regular cycle: Secondary | ICD-10-CM

## 2010-11-29 DIAGNOSIS — D500 Iron deficiency anemia secondary to blood loss (chronic): Secondary | ICD-10-CM

## 2010-11-29 LAB — CBC WITH DIFFERENTIAL/PLATELET
Basophils Absolute: 0.1 10*3/uL (ref 0.0–0.1)
EOS%: 0.3 % (ref 0.0–7.0)
LYMPH%: 77.4 % — ABNORMAL HIGH (ref 14.0–49.7)
MCH: 23.2 pg — ABNORMAL LOW (ref 25.1–34.0)
MCV: 72 fL — ABNORMAL LOW (ref 79.5–101.0)
MONO%: 3.1 % (ref 0.0–14.0)
Platelets: 235 10*3/uL (ref 145–400)
RBC: 4.34 10*6/uL (ref 3.70–5.45)
RDW: 18.4 % — ABNORMAL HIGH (ref 11.2–14.5)
WBC: 38.5 10*3/uL — ABNORMAL HIGH (ref 3.9–10.3)

## 2010-11-29 LAB — IRON AND TIBC
Iron: 27 ug/dL — ABNORMAL LOW (ref 42–145)
UIBC: 238 ug/dL

## 2010-12-05 ENCOUNTER — Other Ambulatory Visit: Payer: Self-pay | Admitting: Oncology

## 2010-12-05 ENCOUNTER — Encounter (HOSPITAL_BASED_OUTPATIENT_CLINIC_OR_DEPARTMENT_OTHER): Payer: BC Managed Care – PPO | Admitting: Oncology

## 2010-12-05 DIAGNOSIS — D500 Iron deficiency anemia secondary to blood loss (chronic): Secondary | ICD-10-CM

## 2010-12-05 DIAGNOSIS — N920 Excessive and frequent menstruation with regular cycle: Secondary | ICD-10-CM

## 2010-12-05 DIAGNOSIS — C8590 Non-Hodgkin lymphoma, unspecified, unspecified site: Secondary | ICD-10-CM

## 2010-12-05 DIAGNOSIS — C8589 Other specified types of non-Hodgkin lymphoma, extranodal and solid organ sites: Secondary | ICD-10-CM

## 2010-12-05 DIAGNOSIS — R161 Splenomegaly, not elsewhere classified: Secondary | ICD-10-CM

## 2010-12-05 LAB — CBC WITH DIFFERENTIAL/PLATELET
Basophils Absolute: 0.1 10*3/uL (ref 0.0–0.1)
EOS%: 0.2 % (ref 0.0–7.0)
HGB: 10.5 g/dL — ABNORMAL LOW (ref 11.6–15.9)
MCH: 22.9 pg — ABNORMAL LOW (ref 25.1–34.0)
MONO#: 1.3 10*3/uL — ABNORMAL HIGH (ref 0.1–0.9)
NEUT#: 7.8 10*3/uL — ABNORMAL HIGH (ref 1.5–6.5)
RDW: 18.1 % — ABNORMAL HIGH (ref 11.2–14.5)
WBC: 36.8 10*3/uL — ABNORMAL HIGH (ref 3.9–10.3)
lymph#: 27.6 10*3/uL — ABNORMAL HIGH (ref 0.9–3.3)

## 2010-12-05 LAB — COMPREHENSIVE METABOLIC PANEL
BUN: 15 mg/dL (ref 6–23)
CO2: 25 mEq/L (ref 19–32)
Calcium: 8.8 mg/dL (ref 8.4–10.5)
Chloride: 102 mEq/L (ref 96–112)
Creatinine, Ser: 0.66 mg/dL (ref 0.40–1.20)

## 2010-12-05 LAB — MORPHOLOGY

## 2010-12-05 LAB — LACTATE DEHYDROGENASE: LDH: 226 U/L (ref 94–250)

## 2010-12-13 ENCOUNTER — Ambulatory Visit (HOSPITAL_COMMUNITY)
Admission: RE | Admit: 2010-12-13 | Discharge: 2010-12-13 | Disposition: A | Discharge status: Home / Self Care | Payer: BC Managed Care – PPO | Source: Ambulatory Visit | Attending: Oncology | Admitting: Oncology

## 2010-12-13 ENCOUNTER — Encounter (HOSPITAL_COMMUNITY): Payer: Self-pay

## 2010-12-13 DIAGNOSIS — R599 Enlarged lymph nodes, unspecified: Secondary | ICD-10-CM | POA: Insufficient documentation

## 2010-12-13 DIAGNOSIS — D72829 Elevated white blood cell count, unspecified: Secondary | ICD-10-CM | POA: Insufficient documentation

## 2010-12-13 DIAGNOSIS — C8589 Other specified types of non-Hodgkin lymphoma, extranodal and solid organ sites: Secondary | ICD-10-CM | POA: Insufficient documentation

## 2010-12-13 DIAGNOSIS — C8590 Non-Hodgkin lymphoma, unspecified, unspecified site: Secondary | ICD-10-CM

## 2010-12-13 DIAGNOSIS — K7689 Other specified diseases of liver: Secondary | ICD-10-CM | POA: Insufficient documentation

## 2010-12-13 MED ORDER — IOHEXOL 300 MG/ML  SOLN
125.0000 mL | Freq: Once | INTRAMUSCULAR | Status: AC | PRN
  Administered 2010-12-13: 125 mL via INTRAVENOUS

## 2011-01-22 LAB — BONE MARROW EXAM

## 2011-02-25 NOTE — Discharge Summary (Signed)
[name], [name]          ACCOUNT NO.:  [id number]   MEDICAL RECORD NO.:  [id number]          PATIENT TYPE:  INP   LOCATION:  5742                         FACILITY:  [facility]   PHYSICIAN:  Olene Craven, M.D.  DATE OF BIRTH:  Mar 16, 1966   DATE OF ADMISSION:  08/14/2007  DATE OF DISCHARGE:  08/18/2007                               DISCHARGE SUMMARY   DISCHARGE DIAGNOSES:  1. Herpes simplex virus type II primoinfection.  2. New onset diabetes mellitus type 2, hemoglobin A1c 12.9%.  3. Urinary tract infection, E.coli (pansensitive).  4. Microcytic anemia, stool fecal occult blood test negative.   DISCHARGE MEDICATIONS:  1. Metformin 850 mg p.o. b.i.d.  2. Metronidazole 500 mg p.o. b.i.d. times 7 days.  3. Acyclovir 400 mg p.o. t.i.d. times 7 days.  4. Fluconazole 100 mg p.o. once daily times 7 days.   DISPOSITION AT DISCHARGE:  The patient has an appointment with Dr.  Elby Showers on November 7 at 1:30 p.m. in the [facility] Medicine  Outpatient [facility]. Please assess the resolution of her HSV infection,  her glycemic control, the resolution of her GU symptoms   HISTORY OF PRESENT ILLNESS:  The patient is a 45 year old African  American woman, who presented to the [facility] Emergency Department on  November 1, with complaints of genital area irritation, inflammation and  pain.  The patient says for the last 6 weeks she has suspected that she  might have a yeast infection and has treated this infection  intermittently with over-the-counter antifungal creams.  The patient  believes that this may have caused an allergic reaction as it has not  helped her inflammation and pain and actually might have worsened it.  On Wednesday, October 29, the patient visited an outside emergency room  with a complaint of genital area pain and inflammation.  She was given  prednisone to calm the inflammation, and they also performed a  urinalysis to rule out urinary tract infection.  On  urinalysis, she was  found to have over 1000 glucose.  She was subsequently diagnosed with  diabetes mellitus type 2.  The patient was discharged on metformin 500  mg and prednisone taper at that time.  The patient did not improve and  was seen again in the Emergency Department 1 day later and was given  OxyContin for pain.  She was advised to stop taking prednisone as this  could worsen her glucose levels.  Finally on Saturday, the patient  arrived in the [facility] Emergency Department with continued pain and  no relief of her inflammation, irritation or rash.  The patient's labs  were obtained.  Of note, the patient had a hemoglobin A1c of 12.9.  We  also performed a wet prep, which the lab did not accept.  The patient's  blood glucose was elevated, but other labs were within normal limits.   PHYSICAL EXAMINATION:  GENERAL:  The patient was noted to be obese.  GU EXAM:  Was significant for an erythematous area around her genital.  There were no obvious ulcers or blisters.  There was no foul odor.  There was significant inflammation  of her labia. A whitish discharge was  noted. The area was exquisitely tender to palpation.   ADMISSION LABS:  Na 140, K 3.8, Cl 106, HCO3 25, BUN 10, creat. 0.8,  glucose 191  AG: 10, bili 1.1, alk phos 75, AST 18, ALT 19, protein 6.2, albumin 3.1,  Ca 8.6  WBC 10.4, ANC 5.2, Hemoglobin 12.0, MCV 73, RDW 15.7, plt 278   HOSPITAL COURSE:  PROBLEM:  1. Genital rash.  The patient was initially given IV opiates in the      Emergency Department for pain.  Once she was in her room, she was      given scheduled p.o. meds for pain control.  We performed an HSV      urine culture, wet prep searching for Trichomonas and yeast as well      as clue cells.  The patient was initially started on prednisone as      we thought that she may have had an allergic reaction clotrimazole.      The patient's labs came back and were positive for herpes simplex      virus type  2.  We then decided to start acyclovir treatment (and to      discontinue the prednisone).  She was given a 10 day course for her      herpes outbreak.  The patient received 3 of these days as an      inpatient and was given a 7 day prescription for 400 mg p.o. t.i.d.      Her pain gradually decreased throughout the hospital course and by      the time of discharge, her pain was controlled without narcotics.      Prior to discharge, we made sure she was able to urinate without      any pain. Patient was counselled regarding transmission of HSV. She      understood that a current outbreak did not mean that she had been      recently infected (thus, did not imply infidelity on her or her      husband's part). We advised for her husband to be screened and to      avoid unprotected sexual until her infection has resolved and her      husband have been tested. Of note, a pregnancy test was negative as      well as an HIV test.  2. Diabetes mellitus type 2.  The patient has recently been diagnosed      with type 2 diabetes.  Her glucose levels during the      hospitalization were often in the high 100s and even in the 200s.      We saw that she was started on metformin on the week prior to      admission (at the moment of diagnosis) and gradually increased her      dose to 850 mg p.o. b.i.d.  She tolerated the metformin well and      denied GI upset.  Her creatinine never was elevated.  The patient      was also put on SSI regimen according to diabetes management      protocol (especially because she received a steroid course).  On      discharge, we recommended follow up with outpatient [facility] for management of her diabetes for the longterm.      She was also given a prescription for metformin and for  a glucose      monitor.  3. Urinary tract infection.  Her urine culture grew > 100 000 of      pansensitive E.coli. However, since she was also complaining of a      foul  vaginal discharge on day of discharge, a repeat wet prep to      evaluate for the aforementioned GU problems was done.  To cover      yeast and bacteria, she was prescribed fluconazole 100 mg p.o. once      daily times 7 days and metronidazole 500 mg p.o. b.i.d. for 7 days.  4. Microcytic anemia.  Although her hemoglobin remained stable at the      lower limits of normal, the patient was noted to have an MCV of 73      on admission. There was no obvious source of bleed and an FOBT was      negative. Her iron panel was consistent with iron deficiency: iron      27, TIBC 285, 9% saturation and ferritin 43. This should be      followed up on an outpatient basis and other causes of microcytosis      (i.e. thalassemia trait) can also be pursued. TSH 3.295 and FT4      1.59.   DISCHARGE LABS:  The patient's wet prep is pending at time of discharge.  This needs to be checked at next office visit.      Olene Craven, M.D.  Electronically Signed     MC/MEDQ  D:  08/19/2007  T:  08/20/2007  Job:  [id number]

## 2011-04-11 ENCOUNTER — Other Ambulatory Visit: Payer: Self-pay | Admitting: Oncology

## 2011-04-11 ENCOUNTER — Encounter (HOSPITAL_BASED_OUTPATIENT_CLINIC_OR_DEPARTMENT_OTHER): Payer: BC Managed Care – PPO | Admitting: Oncology

## 2011-04-11 DIAGNOSIS — N920 Excessive and frequent menstruation with regular cycle: Secondary | ICD-10-CM

## 2011-04-11 DIAGNOSIS — C8589 Other specified types of non-Hodgkin lymphoma, extranodal and solid organ sites: Secondary | ICD-10-CM

## 2011-04-11 LAB — COMPREHENSIVE METABOLIC PANEL
AST: 11 U/L (ref 0–37)
Albumin: 3.8 g/dL (ref 3.5–5.2)
Alkaline Phosphatase: 89 U/L (ref 39–117)
Glucose, Bld: 176 mg/dL — ABNORMAL HIGH (ref 70–99)
Potassium: 4.3 mEq/L (ref 3.5–5.3)
Sodium: 140 mEq/L (ref 135–145)
Total Bilirubin: 0.4 mg/dL (ref 0.3–1.2)
Total Protein: 6.6 g/dL (ref 6.0–8.3)

## 2011-04-11 LAB — CBC WITH DIFFERENTIAL/PLATELET
Basophils Absolute: 0.1 10*3/uL (ref 0.0–0.1)
EOS%: 0.3 % (ref 0.0–7.0)
Eosinophils Absolute: 0.1 10*3/uL (ref 0.0–0.5)
HCT: 32.5 % — ABNORMAL LOW (ref 34.8–46.6)
HGB: 10.4 g/dL — ABNORMAL LOW (ref 11.6–15.9)
LYMPH%: 76.4 % — ABNORMAL HIGH (ref 14.0–49.7)
MCH: 22.3 pg — ABNORMAL LOW (ref 25.1–34.0)
MCV: 69.9 fL — ABNORMAL LOW (ref 79.5–101.0)
MONO%: 3.5 % (ref 0.0–14.0)
NEUT#: 6.6 10*3/uL — ABNORMAL HIGH (ref 1.5–6.5)
NEUT%: 19.6 % — ABNORMAL LOW (ref 38.4–76.8)
Platelets: 209 10*3/uL (ref 145–400)
RDW: 19.1 % — ABNORMAL HIGH (ref 11.2–14.5)

## 2011-04-11 LAB — MORPHOLOGY: PLT EST: ADEQUATE

## 2011-04-11 LAB — IRON AND TIBC
TIBC: 305 ug/dL (ref 250–470)
UIBC: 280 ug/dL

## 2011-07-07 ENCOUNTER — Other Ambulatory Visit: Payer: Self-pay | Admitting: Oncology

## 2011-07-07 ENCOUNTER — Encounter (HOSPITAL_BASED_OUTPATIENT_CLINIC_OR_DEPARTMENT_OTHER): Payer: BC Managed Care – PPO | Admitting: Oncology

## 2011-07-07 DIAGNOSIS — N920 Excessive and frequent menstruation with regular cycle: Secondary | ICD-10-CM

## 2011-07-07 DIAGNOSIS — R161 Splenomegaly, not elsewhere classified: Secondary | ICD-10-CM

## 2011-07-07 DIAGNOSIS — D500 Iron deficiency anemia secondary to blood loss (chronic): Secondary | ICD-10-CM

## 2011-07-07 DIAGNOSIS — D72829 Elevated white blood cell count, unspecified: Secondary | ICD-10-CM

## 2011-07-07 DIAGNOSIS — C8589 Other specified types of non-Hodgkin lymphoma, extranodal and solid organ sites: Secondary | ICD-10-CM

## 2011-07-07 LAB — CBC WITH DIFFERENTIAL/PLATELET
Basophils Absolute: 0.1 10*3/uL (ref 0.0–0.1)
Eosinophils Absolute: 0.1 10*3/uL (ref 0.0–0.5)
HCT: 30.4 % — ABNORMAL LOW (ref 34.8–46.6)
HGB: 9.6 g/dL — ABNORMAL LOW (ref 11.6–15.9)
MCH: 21.8 pg — ABNORMAL LOW (ref 25.1–34.0)
MONO#: 1.6 10*3/uL — ABNORMAL HIGH (ref 0.1–0.9)
NEUT#: 7.8 10*3/uL — ABNORMAL HIGH (ref 1.5–6.5)
NEUT%: 20.9 % — ABNORMAL LOW (ref 38.4–76.8)
WBC: 37 10*3/uL — ABNORMAL HIGH (ref 3.9–10.3)
lymph#: 27.6 10*3/uL — ABNORMAL HIGH (ref 0.9–3.3)

## 2011-07-22 LAB — CBC
HCT: 33.1 — ABNORMAL LOW
HCT: 36.9
Hemoglobin: 10.9 — ABNORMAL LOW
Hemoglobin: 12
MCHC: 32.5
MCHC: 33
MCV: 71.9 — ABNORMAL LOW
MCV: 72.6 — ABNORMAL LOW
Platelets: 244
RBC: 5.08
RDW: 16 — ABNORMAL HIGH

## 2011-07-22 LAB — DIFFERENTIAL
Basophils Absolute: 0
Lymphocytes Relative: 40
Monocytes Absolute: 0.9 — ABNORMAL HIGH
Neutro Abs: 5.2
Neutrophils Relative %: 50

## 2011-07-22 LAB — URINE CULTURE
Colony Count: >=100000
Special Requests: NEGATIVE

## 2011-07-22 LAB — BASIC METABOLIC PANEL
BUN: 5 — ABNORMAL LOW
CO2: 23
CO2: 26
Calcium: 7.9 — ABNORMAL LOW
Chloride: 108
Creatinine, Ser: 0.63
GFR calc Af Amer: >60
GFR calc non Af Amer: >60
GFR calc non Af Amer: >60
Glucose, Bld: 136 — ABNORMAL HIGH
Potassium: 3.3 — ABNORMAL LOW
Sodium: 139
Sodium: 139

## 2011-07-22 LAB — COMPREHENSIVE METABOLIC PANEL
ALT: 19
Albumin: 3.1 — ABNORMAL LOW
Alkaline Phosphatase: 75
BUN: 8
Chloride: 105
Glucose, Bld: 179 — ABNORMAL HIGH
Potassium: 3.9
Sodium: 140
Total Bilirubin: 1.1
Total Protein: 6.2

## 2011-07-22 LAB — URINALYSIS, ROUTINE W REFLEX MICROSCOPIC
Leukocytes, UA: NEGATIVE
Nitrite: NEGATIVE
Specific Gravity, Urine: 1.041 — ABNORMAL HIGH
Urobilinogen, UA: 1
pH: 6

## 2011-07-22 LAB — HERPES SIMPLEX VIRUS CULTURE: Culture: DETECTED

## 2011-07-22 LAB — WET PREP, GENITAL
Trich, Wet Prep: NONE SEEN
Yeast Wet Prep HPF POC: NONE SEEN
Yeast Wet Prep HPF POC: NONE SEEN

## 2011-07-22 LAB — IRON AND TIBC: Saturation Ratios: 9 — ABNORMAL LOW

## 2011-07-22 LAB — HEMOGLOBIN A1C: Hgb A1c MFr Bld: 12.9 — ABNORMAL HIGH

## 2011-07-22 LAB — HIV-1 RNA ULTRAQUANT REFLEX TO GENTYP+
HIV 1 RNA Quant: <50 copies/mL (ref <50)
HIV-1 RNA Quant, Log: <1.7 (ref <1.70)

## 2011-07-22 LAB — POCT I-STAT CREATININE
Creatinine, Ser: 0.8
Operator id: 265201

## 2011-07-22 LAB — URINE MICROSCOPIC-ADD ON

## 2011-07-22 LAB — TRANSFERRIN: Transferrin: 226

## 2011-07-22 LAB — I-STAT 8, (EC8 V) (CONVERTED LAB)
Bicarbonate: 25.3 — ABNORMAL HIGH
Hemoglobin: 14.3
Operator id: 265201
Sodium: 140
TCO2: 27
pCO2, Ven: 43.2 — ABNORMAL LOW

## 2011-07-23 LAB — CBC
HCT: 37.3
MCV: 70.9 — ABNORMAL LOW
RBC: 5.26 — ABNORMAL HIGH
WBC: 14 — ABNORMAL HIGH

## 2011-07-23 LAB — COMPREHENSIVE METABOLIC PANEL
AST: 20
Alkaline Phosphatase: 90
CO2: 23
Chloride: 102
Creatinine, Ser: 0.72
GFR calc Af Amer: >60
GFR calc non Af Amer: >60
Potassium: 4.5
Total Bilirubin: 0.8

## 2011-07-23 LAB — URINALYSIS, ROUTINE W REFLEX MICROSCOPIC
Glucose, UA: >1000 — AB
Ketones, ur: 40 — AB
Leukocytes, UA: NEGATIVE
pH: 5

## 2011-07-23 LAB — I-STAT 8, (EC8 V) (CONVERTED LAB)
Acid-base deficit: 5 — ABNORMAL HIGH
Operator id: 198171
Sodium: 133 — ABNORMAL LOW
TCO2: 19
pCO2, Ven: 26.8 — ABNORMAL LOW
pH, Ven: 7.433 — ABNORMAL HIGH

## 2011-07-23 LAB — POCT I-STAT 3, ART BLOOD GAS (G3+)
Bicarbonate: 20.4
TCO2: 21
pCO2 arterial: 33.8 — ABNORMAL LOW
pH, Arterial: 7.389

## 2011-07-23 LAB — DIFFERENTIAL
Basophils Absolute: 0
Basophils Relative: 0
Eosinophils Absolute: 0
Eosinophils Relative: 0
Lymphocytes Relative: 27

## 2011-07-23 LAB — URINE MICROSCOPIC-ADD ON

## 2011-07-23 LAB — RAPID URINE DRUG SCREEN, HOSP PERFORMED
Barbiturates: NOT DETECTED
Opiates: NOT DETECTED

## 2011-07-23 LAB — HSV PCR: HSV 2 , PCR: DETECTED

## 2011-07-27 ENCOUNTER — Encounter: Payer: Self-pay | Admitting: Oncology

## 2011-07-27 DIAGNOSIS — D72829 Elevated white blood cell count, unspecified: Secondary | ICD-10-CM

## 2011-07-27 HISTORY — DX: Elevated white blood cell count, unspecified: D72.829

## 2011-09-19 ENCOUNTER — Encounter: Payer: Self-pay | Admitting: *Deleted

## 2011-09-22 ENCOUNTER — Encounter: Payer: Self-pay | Admitting: Oncology

## 2011-09-22 DIAGNOSIS — I10 Essential (primary) hypertension: Secondary | ICD-10-CM | POA: Insufficient documentation

## 2011-10-01 ENCOUNTER — Other Ambulatory Visit (HOSPITAL_BASED_OUTPATIENT_CLINIC_OR_DEPARTMENT_OTHER): Payer: BC Managed Care – PPO | Admitting: Lab

## 2011-10-01 ENCOUNTER — Telehealth: Payer: Self-pay | Admitting: *Deleted

## 2011-10-01 ENCOUNTER — Other Ambulatory Visit: Payer: Self-pay | Admitting: Oncology

## 2011-10-01 ENCOUNTER — Ambulatory Visit (HOSPITAL_BASED_OUTPATIENT_CLINIC_OR_DEPARTMENT_OTHER): Payer: BC Managed Care – PPO | Admitting: Oncology

## 2011-10-01 DIAGNOSIS — E785 Hyperlipidemia, unspecified: Secondary | ICD-10-CM

## 2011-10-01 DIAGNOSIS — R161 Splenomegaly, not elsewhere classified: Secondary | ICD-10-CM

## 2011-10-01 DIAGNOSIS — D500 Iron deficiency anemia secondary to blood loss (chronic): Secondary | ICD-10-CM

## 2011-10-01 DIAGNOSIS — C8589 Other specified types of non-Hodgkin lymphoma, extranodal and solid organ sites: Secondary | ICD-10-CM

## 2011-10-01 DIAGNOSIS — I10 Essential (primary) hypertension: Secondary | ICD-10-CM

## 2011-10-01 DIAGNOSIS — D509 Iron deficiency anemia, unspecified: Secondary | ICD-10-CM

## 2011-10-01 DIAGNOSIS — C8307 Small cell B-cell lymphoma, spleen: Secondary | ICD-10-CM

## 2011-10-01 DIAGNOSIS — N920 Excessive and frequent menstruation with regular cycle: Secondary | ICD-10-CM

## 2011-10-01 DIAGNOSIS — E119 Type 2 diabetes mellitus without complications: Secondary | ICD-10-CM

## 2011-10-01 LAB — CHCC SMEAR

## 2011-10-01 LAB — CBC WITH DIFFERENTIAL/PLATELET
BASO%: 0.2 % (ref 0.0–2.0)
EOS%: 0.3 % (ref 0.0–7.0)
LYMPH%: 80.1 % — ABNORMAL HIGH (ref 14.0–49.7)
MCH: 21.2 pg — ABNORMAL LOW (ref 25.1–34.0)
MCHC: 31.3 g/dL — ABNORMAL LOW (ref 31.5–36.0)
MCV: 67.7 fL — ABNORMAL LOW (ref 79.5–101.0)
MONO%: 2.5 % (ref 0.0–14.0)
NEUT%: 16.9 % — ABNORMAL LOW (ref 38.4–76.8)
Platelets: 206 10*3/uL (ref 145–400)
RBC: 4.24 10*6/uL (ref 3.70–5.45)
WBC: 38.8 10*3/uL — ABNORMAL HIGH (ref 3.9–10.3)

## 2011-10-01 LAB — COMPREHENSIVE METABOLIC PANEL
ALT: 11 U/L (ref 0–35)
Alkaline Phosphatase: 84 U/L (ref 39–117)
Creatinine, Ser: 0.65 mg/dL (ref 0.50–1.10)
Glucose, Bld: 185 mg/dL — ABNORMAL HIGH (ref 70–99)
Sodium: 140 mEq/L (ref 135–145)
Total Bilirubin: 0.4 mg/dL (ref 0.3–1.2)
Total Protein: 6 g/dL (ref 6.0–8.3)

## 2011-10-01 LAB — LACTATE DEHYDROGENASE: LDH: 265 U/L — ABNORMAL HIGH (ref 94–250)

## 2011-10-01 LAB — IRON AND TIBC
Iron: 26 ug/dL — ABNORMAL LOW (ref 42–145)
TIBC: 278 ug/dL (ref 250–470)
UIBC: 252 ug/dL (ref 125–400)

## 2011-10-01 LAB — FERRITIN: Ferritin: 38 ng/mL (ref 10–291)

## 2011-10-01 NOTE — Telephone Encounter (Signed)
Message copied by Wende Mott on Wed Oct 01, 2011  4:44 PM ------      Message from: HA, Raliegh Ip T      Created: Wed Oct 01, 2011  3:27 PM       Please call pt.  Even though she has anemia, no severe iron deficiency to warrant IV iron.  Encourage oral iron (NuIron or Slow Fe 150mg  PO daily).   Thanks.

## 2011-10-01 NOTE — Telephone Encounter (Signed)
Called pt's home and she was not there.  Spoke w/ husband and gave instructions per Dr. Gaylyn Rong below for pt to take oral iron at home and no need for IV iron at this time.  Instructed that pt may call us back w/ any questions.  He verbalized understanding.

## 2011-10-01 NOTE — Progress Notes (Signed)
[facility] OFFICE PROGRESS NOTE  No primary provider on file. DIAGNOSIS: 1.  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  She has no adenopathy but she has splenomegaly and bone marrow involvement.       2.    Iron deficiency anemia from history of menometrorrhagia.  PAST THERAPY/ CURRENT THERAPY::  Observation for lymphoma and oral iron replacement for iron deficiency anemia.  INTERVAL HISTORY: Desiree [name] 45 y.o. female returns for follow up. She reports that she feels well.  She is trying to eat less food and healthier food and trying to be more active, walking long distance from parking lot to her office.  She has mild bilateral knee discomfort that is resolved with ibuprofen once every few days.  She does not have any swelling, increase in temperature, bleeding or erythema from the bilateral knees.  With respect to her non-Hodgkin lymphoma, she is not having any symptoms with severe night sweats, drenching night sweats, anorexia, early satiety, palpable lymph node swelling, recurrent infection. Her periods continue to be heavy, lasting about 5 days. LMP was on December 1.  MEDICAL HISTORY: Past Medical History  Diagnosis Date  . Leukocytosis 07/27/2011  . 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 DM 08/20/2007  . Hypertension     SURGICAL HISTORY: No past surgical history on file.  MEDICATIONS: Current Outpatient Prescriptions  Medication Sig Dispense Refill  . fish oil-omega-3 fatty acids 1000 MG capsule Take 1 g by mouth daily.        Marland Kitchen ibuprofen (ADVIL,MOTRIN) 200 MG tablet Take 200 mg by mouth every 6 (six) hours as needed.        . metFORMIN (GLUCOPHAGE) 1000 MG tablet Take 1,000 mg by mouth 2 (two) times daily with a meal.        . pravastatin (PRAVACHOL) 20 MG tablet Take 20 mg by mouth daily.           Iron 65 mg OTC po  qd  ALLERGIES:  is allergic to antifungal.  REVIEW OF SYSTEMS:  She denies headache, visual changes, confusion, recurrent mouth sores, mucositis, neck swelling, shortness of breath, chest pain, abdominal pain or swelling, melena, hematochezia, hematuria, palpitations, PND, orthopnea.  She has intermittent skin rashes relating to her menstrual period that has been going on for many years.  She denied any severe bilateral lower extremity edema, lower extremity paresthesia, bowel or bladder incontinence, heat or cold intolerance, depression, suicidal or homicidal ideation. The rest of the 14-point review of system was negative.   Filed Vitals:   10/01/11 0946  BP: 127/76  Pulse: 107  Temp: 97.9 F (36.6 C)   Wt Readings from Last 3 Encounters:  10/01/11 283 lb 8 oz (128.595 kg)  08/20/07 286 lb 6.4 oz (129.91 kg)   ECOG Performance status:   PHYSICAL EXAMINATION:   General:  well-nourished in no acute distress.  Eyes:  no scleral icterus.  ENT:  There were no oropharyngeal lesions.  Neck was without thyromegaly.  Lymphatics:  Negative cervical, supraclavicular or axillary adenopathy.  Respiratory: lungs were clear bilaterally without wheezing or crackles.  Cardiovascular:  Regular rate and rhythm, S1/S2, with a soft 1/6 systolic  Murmur, no rub or gallop.  There was a trace of pedal edema bilaterally.  GI:  abdomen was obese,soft, flat, nontender, nondistended, without organomegaly.  Muscoloskeletal:  no spinal tenderness of palpation of vertebral spine.  Skin exam was without echymosis, petechiae.  Neuro exam was nonfocal.  Patient was able to get on and off exam table without assistance.  Gait was normal.  Patient was alerted and oriented.  Attention was good.   Language was appropriate.  Mood was normal without depression.  Speech was not pressured.  Thought content was not tangential.      LABORATORY/RADIOLOGY DATA:   Lab 10/01/11 0908  WBC 38.8*  HGB 9.0*  HCT 28.7*  PLT 206  MCV  67.7*  MCH 21.2*  MCHC 31.3*  RDW 19.0*  LYMPHSABS 31.1*  MONOABS 1.0*  EOSABS 0.1  BASOSABS 0.1  BANDABS --    CMP   No results found for this basename: NA:5,K:5,CL:5,CO2:5,GLUCOSE:5,BUN:5,CREATININE:5,GFRCGP,:5,CALCIUM:5,MG:5,AST:5,ALT:5,ALKPHOS:5,BILITOT:5 in the last 168 hours    Radiology Studies:  No results found.   ASSESSMENT AND PLAN:   1. History of non-Hodgkin lymphoma.  This is indolent, and she has been asymptomatic.  She has mild anemia,most likely attributable to her menorrhagia.  She does not have pancytopenia.  Therefore, pursuant to Dr. Kathi Der advice, we will continue with observation at this time.  I instructed the patient to watch out for B symptoms.  2. Diabetes mellitus, type II.  She is on metformin 1000 mg p.o. b.i.d. per PCP. 3. Hyperlipidemia:  pravastatin 20 mg p.o. daily per PCP. 4. Anemia from a history of menorrhagia. She takes with oral iron.  However, her Hb today is 9.0 and Hct is 28.7 with MCV of 67.7. Will add Iron studies to the labs drawn today. Pending on the results, she may need Iron infusion at our clinic. Pt to be informed of the plans. Will check her CBC every 2 months instead of every 3 months.  5. Follow-up: With Dr.Ha in 6 months.   6.

## 2011-11-04 IMAGING — CT CT CHEST W/ CM
2 of 5 series · 15 of 36 positions shown, 18 images · IV contrast (agent unspecified)
Comparison: 01/19/2009

CT CHEST

CLINICAL DATA: Follow up of non-Hodgkins lymphoma.  Increased
white blood cell count.  Evaluate splenic size.  No complaints.

CT CHEST, ABDOMEN AND PELVIS WITH CONTRAST
TECHNIQUE: Contiguous axial images of the chest abdomen and pelvis
were obtained after IV contrast administration.
Contrast: 125 ml Hmnipaque-KBB

[Series 2: cap with st · axial · 0.89mm/px · z∈[-703,-103]mm · 12 of 137 slices shown, 15 images]
[im 9/137  mediastinal]
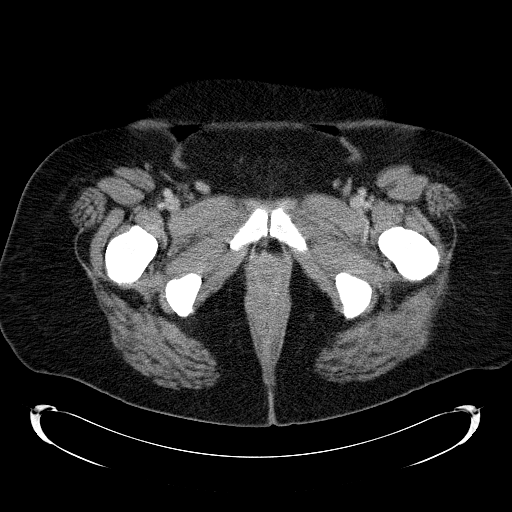
[im 9/137  lung]
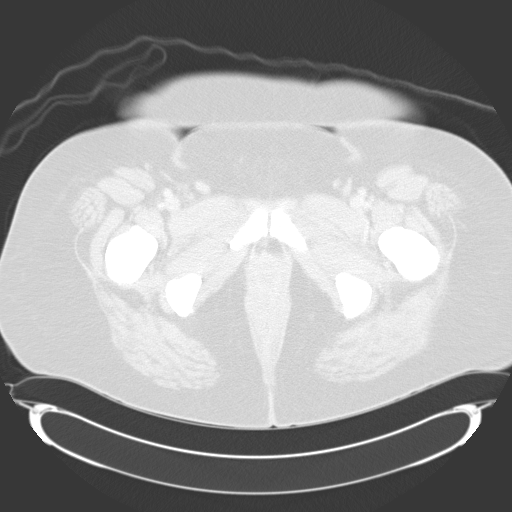
[im 25/137  lung]
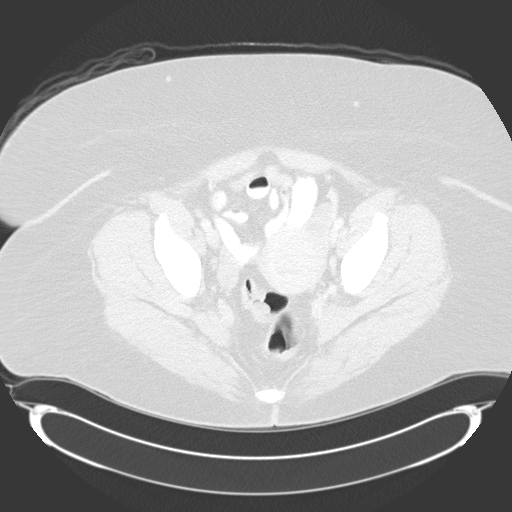
[im 33/137  lung]
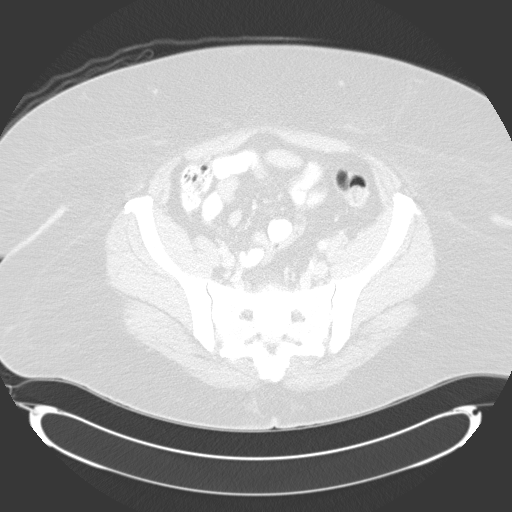
[im 41/137  lung]
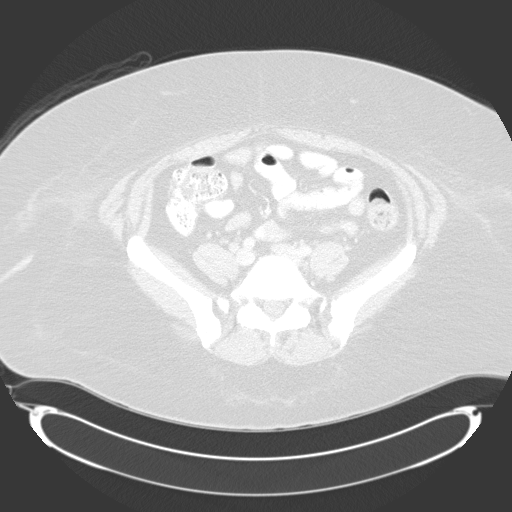
[im 57/137  mediastinal]
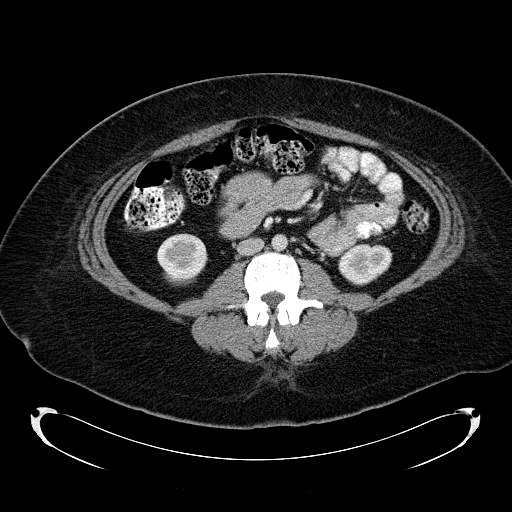
[im 57/137  lung]
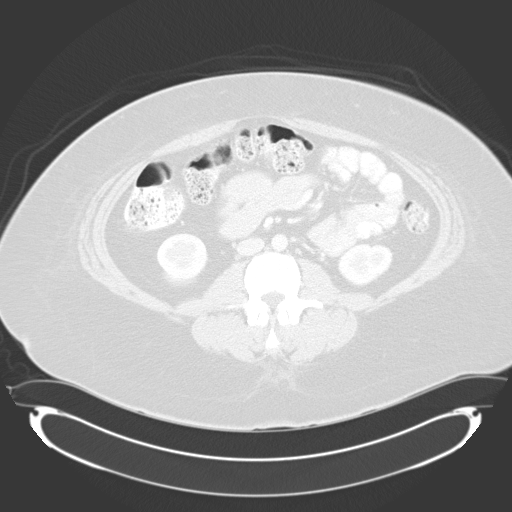
[im 65/137  lung]
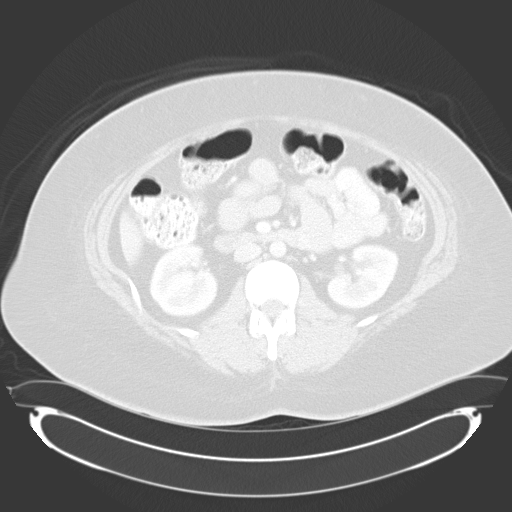
[im 73/137  lung]
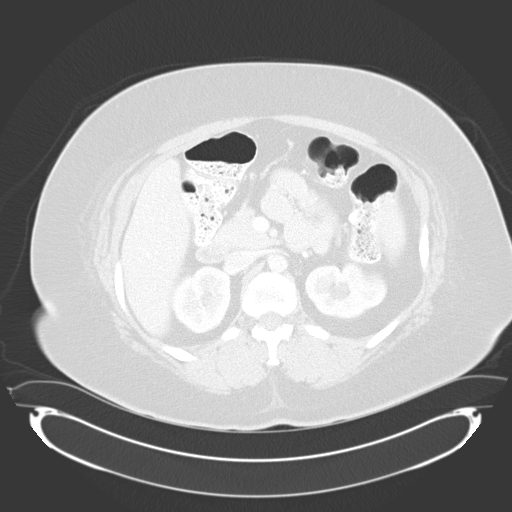
[im 89/137  lung]
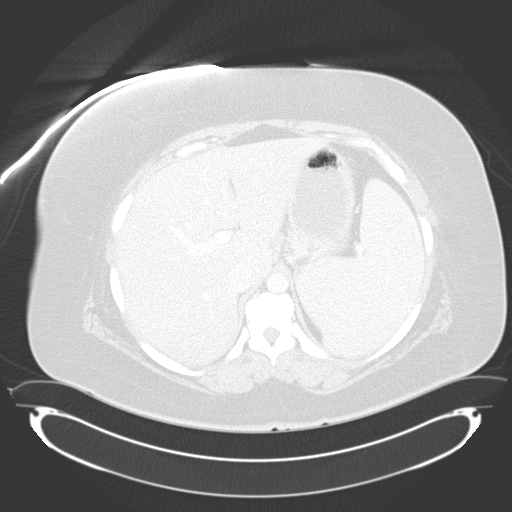
[im 97/137  mediastinal]
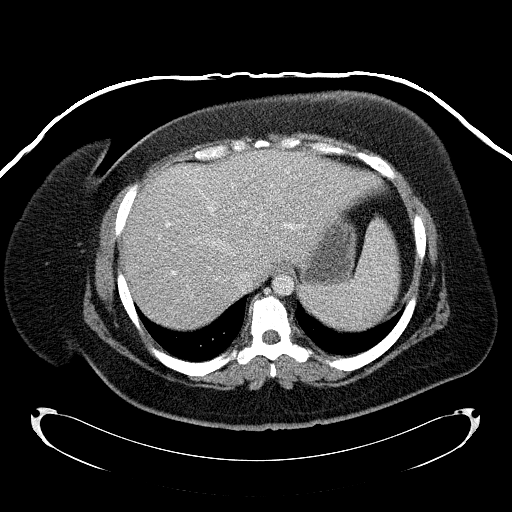
[im 97/137  lung]
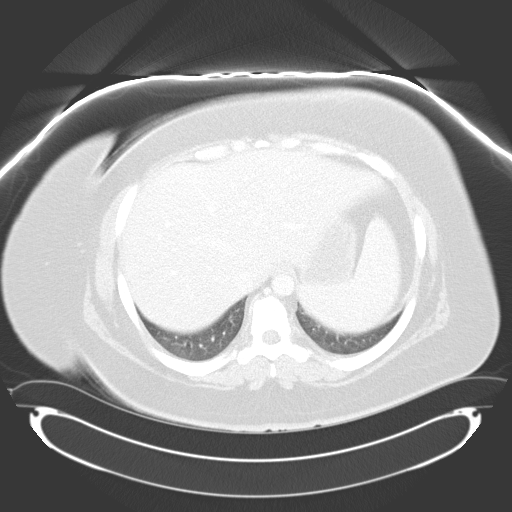
[im 105/137  lung]
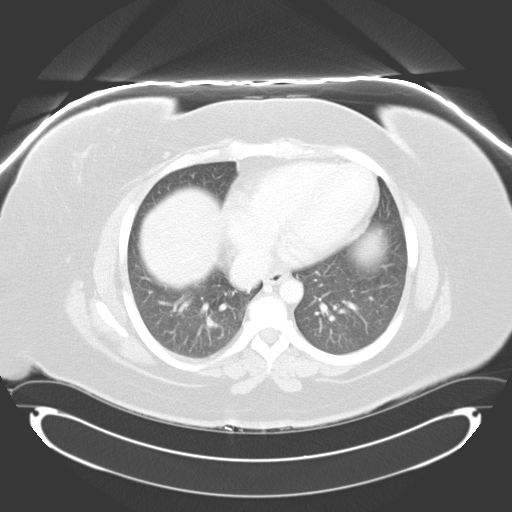
[im 121/137  lung]
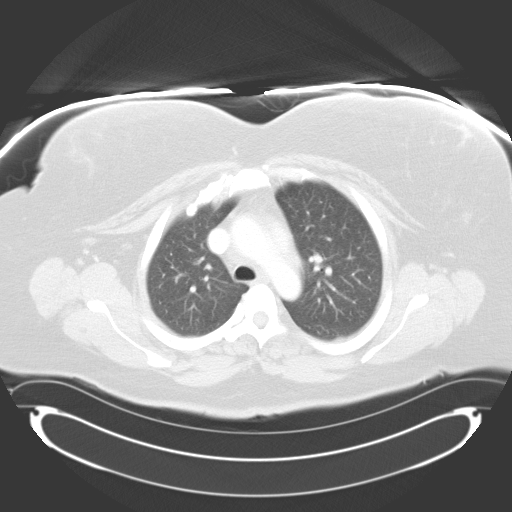
[im 129/137  lung]
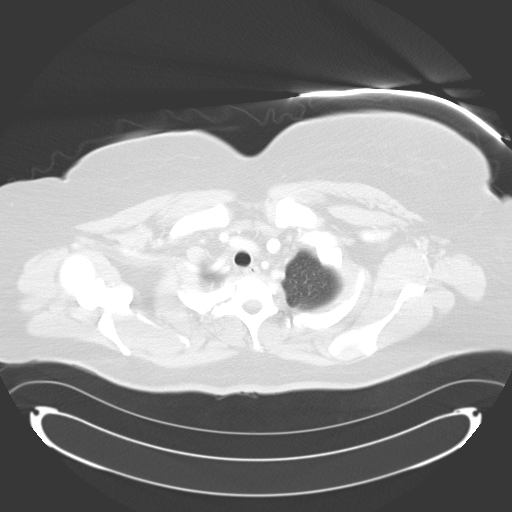

[Series 602: coronal images · coronal · 1.37mm/px · 3 of 96 slices shown]
[im 20/96  lung]
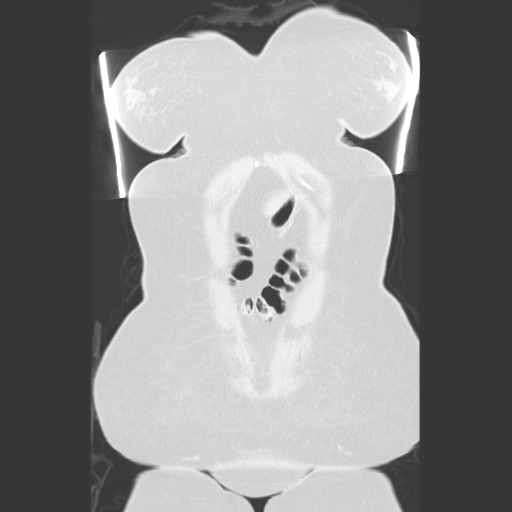
[im 39/96  lung]
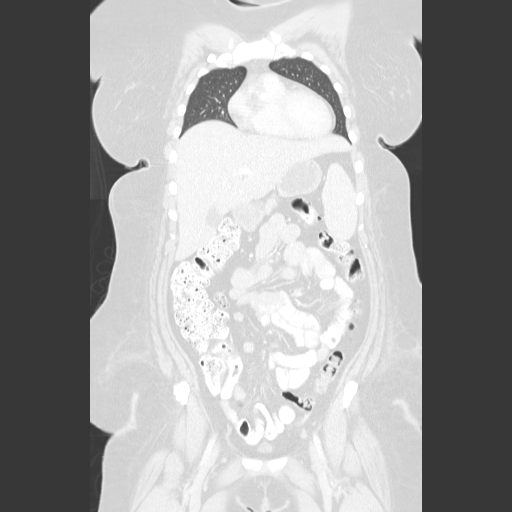
[im 58/96  lung]
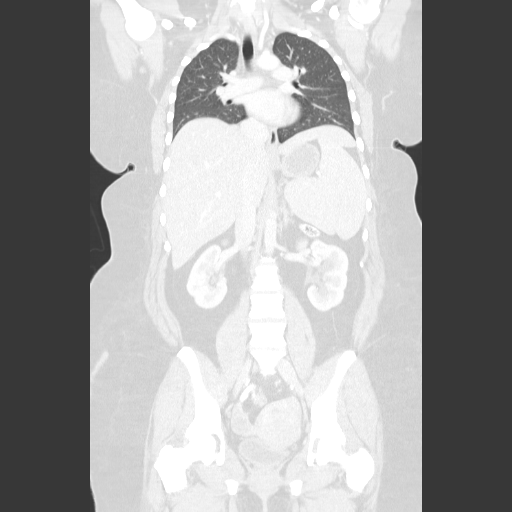

[15 of 36 positions shown; findings below may reference images not displayed]

FINDINGS: Lung windows demonstrate no nodules or airspace
opacities.

Soft tissue windows demonstrate vague hypoattenuating left thyroid
nodule which measures approximately 1 cm and is unchanged.  No
axillary adenopathy.

Borderline cardiomegaly without pericardial or pleural effusion. No
mediastinal or hilar adenopathy.  A left internal mammary node
measures 7 mm on image 21 and is new since the prior exam.
Right cardiophrenic angle node measures 7 mm on image 35 and is
unchanged.
IMPRESSION: 1. No acute process or evidence of active lymphoma within the
chest.
2.  A single borderline/mildly enlarged left internal mammary lymph
node.  Favored to be reactive.  If the patient undergoes follow-up
imaging, recommend attention to this area.

CT ABDOMEN AND PELVIS
FINDINGS: No focal liver lesion.  The spleen measures maximally
10.8 x 16.7 cm transverse and 13.3 cm cranial caudal.  On the prior
exam, this measured 15.7 x 9.8 cm transverse and 12.2 cm cranial
caudal.  Normal stomach, pancreas, gallbladder, biliary tract,
adrenal glands, kidneys. No retroperitoneal or retrocrural
adenopathy.

Normal colon, appendix, and terminal ileum.  Normal small bowel
without abdominal ascites.

A right common iliac node measures 8 mm today versus 6 mm on the
prior exam.  Image 97.  A right external iliac node measures 8 mm
on image 112 versus 6 mm on the prior.  Normal urinary bladder and
uterus without adnexal mass or significant free pelvic fluid.
Nonspecific  sub these nodule in the right flank is similar, less
than 1 cm.

No acute osseous abnormality.

Liver measures 22.4 cm cranial caudal.  21.2 cm on the prior.
IMPRESSION: 1.  Slight increase in hepatosplenomegaly.
2.  Slight enlargement of pelvic lymph nodes.  These are not
pathologic by size criteria.  Nonspecific.

## 2011-12-03 ENCOUNTER — Other Ambulatory Visit (HOSPITAL_BASED_OUTPATIENT_CLINIC_OR_DEPARTMENT_OTHER): Payer: BC Managed Care – PPO | Admitting: Lab

## 2011-12-03 DIAGNOSIS — D509 Iron deficiency anemia, unspecified: Secondary | ICD-10-CM

## 2011-12-03 DIAGNOSIS — C8307 Small cell B-cell lymphoma, spleen: Secondary | ICD-10-CM

## 2011-12-03 DIAGNOSIS — I10 Essential (primary) hypertension: Secondary | ICD-10-CM

## 2011-12-03 LAB — CBC WITH DIFFERENTIAL/PLATELET
BASO%: 0.3 % (ref 0.0–2.0)
Basophils Absolute: 0.1 10*3/uL (ref 0.0–0.1)
HCT: 28.9 % — ABNORMAL LOW (ref 34.8–46.6)
HGB: 9 g/dL — ABNORMAL LOW (ref 11.6–15.9)
LYMPH%: 78.8 % — ABNORMAL HIGH (ref 14.0–49.7)
MCHC: 31.2 g/dL — ABNORMAL LOW (ref 31.5–36.0)
MONO#: 1.1 10*3/uL — ABNORMAL HIGH (ref 0.1–0.9)
NEUT%: 16.9 % — ABNORMAL LOW (ref 38.4–76.8)
Platelets: 191 10*3/uL (ref 145–400)
WBC: 30 10*3/uL — ABNORMAL HIGH (ref 3.9–10.3)
lymph#: 23.6 10*3/uL — ABNORMAL HIGH (ref 0.9–3.3)

## 2011-12-05 ENCOUNTER — Telehealth: Payer: Self-pay

## 2011-12-05 NOTE — Telephone Encounter (Signed)
Message copied by Kallie Locks on Fri Dec 05, 2011 10:28 AM ------      Message from: HA, Raliegh Ip T      Created: Wed Dec 03, 2011  8:20 AM       Please call pt.  Her WBC and Hgb are stable.  Continue observation.  Thankx.

## 2011-12-05 NOTE — Telephone Encounter (Signed)
Message copied by Kallie Locks on Fri Dec 05, 2011 10:18 AM ------      Message from: HA, Raliegh Ip T      Created: Wed Dec 03, 2011  8:20 AM       Please call pt.  Her WBC and Hgb are stable.  Continue observation.  Thankx.

## 2011-12-10 ENCOUNTER — Emergency Department (INDEPENDENT_AMBULATORY_CARE_PROVIDER_SITE_OTHER)
Admission: EM | Admit: 2011-12-10 | Discharge: 2011-12-10 | Disposition: A | Discharge status: Home / Self Care | Payer: BC Managed Care – PPO | Source: Home / Self Care | Attending: Emergency Medicine | Admitting: Emergency Medicine

## 2011-12-10 ENCOUNTER — Encounter (HOSPITAL_COMMUNITY): Payer: Self-pay | Admitting: *Deleted

## 2011-12-10 DIAGNOSIS — IMO0002 Reserved for concepts with insufficient information to code with codable children: Secondary | ICD-10-CM

## 2011-12-10 DIAGNOSIS — S46912A Strain of unspecified muscle, fascia and tendon at shoulder and upper arm level, left arm, initial encounter: Secondary | ICD-10-CM

## 2011-12-10 DIAGNOSIS — M545 Low back pain: Secondary | ICD-10-CM

## 2011-12-10 HISTORY — DX: Non-Hodgkin lymphoma, unspecified, unspecified site: C85.90

## 2011-12-10 MED ORDER — HYDROCODONE-ACETAMINOPHEN 5-325 MG PO TABS: ORAL_TABLET | ORAL | Status: AC

## 2011-12-10 MED ORDER — NAPROXEN 375 MG PO TABS: 375.0000 mg | ORAL_TABLET | Freq: Two times a day (BID) | ORAL | Status: DC

## 2011-12-10 NOTE — Discharge Instructions (Signed)
Take medications as directed. Use mild heat (heating pad, warm baths, etc) for 10 to 15 minutes, two to three times daily, as needed and as tolerated, taking care to not burn the skin. Begin stretches and exercises, as instructed in handouts, after 48 hours. Return to care should your symptoms not improve, or worsen in any way, such as numbness, weakness, or tingling, or inability to control urine or bowel movements.   
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
## 2011-12-25 ENCOUNTER — Other Ambulatory Visit: Payer: Self-pay | Admitting: Family Medicine

## 2011-12-25 DIAGNOSIS — Z1231 Encounter for screening mammogram for malignant neoplasm of breast: Secondary | ICD-10-CM

## 2011-12-29 ENCOUNTER — Telehealth: Payer: Self-pay

## 2011-12-29 ENCOUNTER — Other Ambulatory Visit (HOSPITAL_BASED_OUTPATIENT_CLINIC_OR_DEPARTMENT_OTHER): Payer: BC Managed Care – PPO | Admitting: Lab

## 2011-12-29 DIAGNOSIS — C8589 Other specified types of non-Hodgkin lymphoma, extranodal and solid organ sites: Secondary | ICD-10-CM

## 2011-12-29 DIAGNOSIS — R161 Splenomegaly, not elsewhere classified: Secondary | ICD-10-CM

## 2011-12-29 DIAGNOSIS — N920 Excessive and frequent menstruation with regular cycle: Secondary | ICD-10-CM

## 2011-12-29 DIAGNOSIS — D500 Iron deficiency anemia secondary to blood loss (chronic): Secondary | ICD-10-CM

## 2011-12-29 LAB — CBC WITH DIFFERENTIAL/PLATELET
Basophils Absolute: 0.1 10*3/uL (ref 0.0–0.1)
EOS%: 0.4 % (ref 0.0–7.0)
Eosinophils Absolute: 0.1 10*3/uL (ref 0.0–0.5)
HCT: 28.6 % — ABNORMAL LOW (ref 34.8–46.6)
HGB: 8.7 g/dL — ABNORMAL LOW (ref 11.6–15.9)
LYMPH%: 80.6 % — ABNORMAL HIGH (ref 14.0–49.7)
MCH: 20.8 pg — ABNORMAL LOW (ref 25.1–34.0)
MCV: 68.2 fL — ABNORMAL LOW (ref 79.5–101.0)
MONO%: 2.5 % (ref 0.0–14.0)
NEUT#: 6.1 10*3/uL (ref 1.5–6.5)
NEUT%: 16.3 % — ABNORMAL LOW (ref 38.4–76.8)
Platelets: 194 10*3/uL (ref 145–400)

## 2011-12-29 NOTE — Telephone Encounter (Signed)
Message copied by Kallie Locks on Mon Dec 29, 2011 11:01 AM ------      Message from: HA, Raliegh Ip T      Created: Mon Dec 29, 2011  8:39 AM       Please call pt.  Her WBC is slightly elevated but overall stable.  This is from her indolent, slow growing lymphoma.  Will continue to observe at this time.  Thanks.

## 2011-12-31 ENCOUNTER — Ambulatory Visit
Admission: RE | Admit: 2011-12-31 | Discharge: 2011-12-31 | Disposition: A | Discharge status: Home / Self Care | Payer: BC Managed Care – PPO | Source: Ambulatory Visit | Attending: Family Medicine | Admitting: Family Medicine

## 2011-12-31 DIAGNOSIS — Z1231 Encounter for screening mammogram for malignant neoplasm of breast: Secondary | ICD-10-CM

## 2012-01-29 ENCOUNTER — Other Ambulatory Visit (HOSPITAL_BASED_OUTPATIENT_CLINIC_OR_DEPARTMENT_OTHER): Payer: BC Managed Care – PPO | Admitting: Lab

## 2012-01-29 DIAGNOSIS — I10 Essential (primary) hypertension: Secondary | ICD-10-CM

## 2012-01-29 DIAGNOSIS — D509 Iron deficiency anemia, unspecified: Secondary | ICD-10-CM

## 2012-01-29 DIAGNOSIS — C8307 Small cell B-cell lymphoma, spleen: Secondary | ICD-10-CM

## 2012-01-29 LAB — CBC WITH DIFFERENTIAL/PLATELET
BASO%: 0.2 % (ref 0.0–2.0)
HCT: 27.7 % — ABNORMAL LOW (ref 34.8–46.6)
LYMPH%: 83.3 % — ABNORMAL HIGH (ref 14.0–49.7)
MCH: 20.4 pg — ABNORMAL LOW (ref 25.1–34.0)
MCHC: 29.7 g/dL — ABNORMAL LOW (ref 31.5–36.0)
MCV: 68.5 fL — ABNORMAL LOW (ref 79.5–101.0)
MONO#: 0.8 10*3/uL (ref 0.1–0.9)
MONO%: 2.5 % (ref 0.0–14.0)
NEUT%: 13.7 % — ABNORMAL LOW (ref 38.4–76.8)
Platelets: 169 10*3/uL (ref 145–400)
RBC: 4.04 10*6/uL (ref 3.70–5.45)
WBC: 32 10*3/uL — ABNORMAL HIGH (ref 3.9–10.3)

## 2012-01-30 ENCOUNTER — Telehealth: Payer: Self-pay

## 2012-01-30 NOTE — Telephone Encounter (Signed)
Message copied by Kallie Locks on Fri Jan 30, 2012  4:28 PM ------      Message from: HA, Raliegh Ip T      Created: Thu Jan 29, 2012  8:30 AM       Please call patient.  Her WBC is elevated but stable.  Her anemia is slightly worsened.  Please remind her to take oral iron BID along with Vitamin C (to improve absorption of iron).  Thanks.

## 2012-03-26 ENCOUNTER — Ambulatory Visit (HOSPITAL_BASED_OUTPATIENT_CLINIC_OR_DEPARTMENT_OTHER): Payer: BC Managed Care – PPO

## 2012-03-26 ENCOUNTER — Encounter: Payer: Self-pay | Admitting: Oncology

## 2012-03-26 ENCOUNTER — Ambulatory Visit (HOSPITAL_BASED_OUTPATIENT_CLINIC_OR_DEPARTMENT_OTHER): Payer: BC Managed Care – PPO | Admitting: Oncology

## 2012-03-26 ENCOUNTER — Telehealth: Payer: Self-pay | Admitting: Oncology

## 2012-03-26 ENCOUNTER — Other Ambulatory Visit (HOSPITAL_BASED_OUTPATIENT_CLINIC_OR_DEPARTMENT_OTHER): Payer: BC Managed Care – PPO | Admitting: Lab

## 2012-03-26 VITALS — BP 125/84 | HR 88 | Temp 97.7°F

## 2012-03-26 VITALS — BP 117/75 | HR 98 | Temp 97.1°F | Ht 67.5 in | Wt 247.9 lb

## 2012-03-26 DIAGNOSIS — N920 Excessive and frequent menstruation with regular cycle: Secondary | ICD-10-CM

## 2012-03-26 DIAGNOSIS — C8307 Small cell B-cell lymphoma, spleen: Secondary | ICD-10-CM

## 2012-03-26 DIAGNOSIS — I10 Essential (primary) hypertension: Secondary | ICD-10-CM

## 2012-03-26 DIAGNOSIS — D500 Iron deficiency anemia secondary to blood loss (chronic): Secondary | ICD-10-CM | POA: Insufficient documentation

## 2012-03-26 DIAGNOSIS — D509 Iron deficiency anemia, unspecified: Secondary | ICD-10-CM

## 2012-03-26 DIAGNOSIS — R161 Splenomegaly, not elsewhere classified: Secondary | ICD-10-CM

## 2012-03-26 LAB — TECHNOLOGIST REVIEW

## 2012-03-26 LAB — FERRITIN: Ferritin: 34 ng/mL (ref 10–291)

## 2012-03-26 LAB — COMPREHENSIVE METABOLIC PANEL
AST: 15 U/L (ref 0–37)
Albumin: 3.8 g/dL (ref 3.5–5.2)
BUN: 10 mg/dL (ref 6–23)
CO2: 26 mEq/L (ref 19–32)
Calcium: 8.8 mg/dL (ref 8.4–10.5)
Chloride: 107 mEq/L (ref 96–112)
Glucose, Bld: 105 mg/dL — ABNORMAL HIGH (ref 70–99)
Potassium: 4.1 mEq/L (ref 3.5–5.3)

## 2012-03-26 LAB — CBC WITH DIFFERENTIAL/PLATELET
Basophils Absolute: 0.1 10*3/uL (ref 0.0–0.1)
EOS%: 0.2 % (ref 0.0–7.0)
HGB: 8 g/dL — ABNORMAL LOW (ref 11.6–15.9)
MCH: 19.9 pg — ABNORMAL LOW (ref 25.1–34.0)
NEUT#: 4.8 10*3/uL (ref 1.5–6.5)
RDW: 19.7 % — ABNORMAL HIGH (ref 11.2–14.5)
lymph#: 36.2 10*3/uL — ABNORMAL HIGH (ref 0.9–3.3)

## 2012-03-26 MED ORDER — SODIUM CHLORIDE 0.9 % IV SOLN
1020.0000 mg | Freq: Once | INTRAVENOUS | Status: AC
  Administered 2012-03-26: 1020 mg via INTRAVENOUS
  Filled 2012-03-26: qty 34

## 2012-03-26 NOTE — Patient Instructions (Addendum)
1.  Low-grade B-cell non-Hodgkin's lymphoma, most likely splenic marginal zone B-cell lymphoma:  Since you are still asymptomatic from the diagnosis, I recommend to continue watchful observation at this time. 2.  Iron deficiency anemia from history of menometrorrhagia:  Continue oral iron.  Will give you IV iron today Feraheme.  In the future, if IV iron still does not work and you still have iron deficiency, you may consider hysterectomy.  If IV iron increases your iron level and you are still anemic; then we may consider repeating bone marrow biopsy to rule out anemia from lymphoma.  3. Followup: Lab appointments every 2 weeks; starting in 2 wks;  Return visit with Belenda Cruise my nurse practitioner.    Thank you.

## 2012-03-26 NOTE — Telephone Encounter (Signed)
appts made and printed for pt  °

## 2012-03-26 NOTE — Progress Notes (Signed)
[facility]  [phone] [phone]   OFFICE PROGRESS NOTE   Cc:  Thora Lance, MD  DIAGNOSIS:  1. 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 She has no adenopathy but she has splenomegaly and bone marrow involvement. 2. Iron deficiency anemia from history of menometrorrhagia.   PAST THERAPY/ CURRENT THERAPY:Observation for lymphoma and oral iron replacement for iron deficiency anemia.   INTERVAL HISTORY: Desiree [name] 46 y.o. female returns for regular follow up.  She is here by herself.  She has been taking iron BID with VitC.  However, she still has anemia.  She still has menometrorrhagia.  Her LMP was about 2 weeks ago which was early.  She normally has to double up on the pads.  She has been trying to lose weight actively by eating no sweat and exercise.    Patient denies fatigue, headache, visual changes, confusion, drenching night sweats, palpable lymph node swelling, mucositis, odynophagia, dysphagia, nausea vomiting, jaundice, chest pain, palpitation, shortness of breath, dyspnea on exertion, productive cough, gum bleeding, epistaxis, hematemesis, hemoptysis, abdominal pain, abdominal swelling, early satiety, melena, hematochezia, hematuria, skin rash, spontaneous bleeding, joint swelling, joint pain, heat or cold intolerance, bowel bladder incontinence, back pain, focal motor weakness, paresthesia, depression, suicidal or homocidal ideation, feeling hopelessness.   Past Medical History  Diagnosis Date  . Leukocytosis 07/27/2011  . 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 DM 08/20/2007  . Hypertension   . Non Hodgkin's lymphoma   . Iron deficiency anemia due to chronic blood loss     No past surgical history on file.  Current Outpatient Prescriptions  Medication Sig Dispense Refill  . fish oil-omega-3  fatty acids 1000 MG capsule Take 1 g by mouth daily.        Marland Kitchen ibuprofen (ADVIL,MOTRIN) 200 MG tablet Take 200 mg by mouth every 6 (six) hours as needed.        . metFORMIN (GLUCOPHAGE) 1000 MG tablet Take 1,000 mg by mouth 2 (two) times daily with a meal.        . naproxen (NAPROSYN) 375 MG tablet Take 1 tablet (375 mg total) by mouth 2 (two) times daily.  20 tablet  0  . pravastatin (PRAVACHOL) 20 MG tablet Take 20 mg by mouth daily.         No current facility-administered medications for this visit.   Facility-Administered Medications Ordered in Other Visits  Medication Dose Route Frequency Provider Last Rate Last Dose  . ferumoxytol (FERAHEME) 1,020 mg in sodium chloride 0.9 % 100 mL IVPB  1,020 mg Intravenous Once Exie Parody, MD   1,020 mg at 03/26/12 1033    ALLERGIES:  is allergic to erythromycin and tolnaftate.  REVIEW OF SYSTEMS:  The rest of the 14-point review of system was negative.   Filed Vitals:   03/26/12 0918  BP: 117/75  Pulse: 98  Temp: 97.1 F (36.2 C)   Wt Readings from Last 3 Encounters:  03/26/12 247 lb 14.4 oz (112.447 kg)  10/01/11 283 lb 8 oz (128.595 kg)  08/20/07 286 lb 6.4 oz (129.91 kg)   ECOG Performance status: 0  PHYSICAL EXAMINATION:   General:  Mildly obese woman, in no acute distress.  Eyes:  no scleral icterus.  ENT:  There were no oropharyngeal lesions.  Neck was without thyromegaly.  Lymphatics:  Negative cervical, supraclavicular or axillary adenopathy.  Respiratory: lungs  were clear bilaterally without wheezing or crackles.  Cardiovascular:  Regular rate and rhythm, S1/S2, without murmur, rub or gallop.  There was no pedal edema.  GI:  abdomen was soft, flat, nontender, nondistended, without organomegaly.  Muscoloskeletal:  no spinal tenderness of palpation of vertebral spine.  Skin exam was without echymosis, petichae.  Neuro exam was nonfocal.  Patient was able to get on and off exam table without assistance.  Gait was normal.  Patient was  alerted and oriented.  Attention was good.   Language was appropriate.  Mood was normal without depression.  Speech was not pressured.  Thought content was not tangential.     LABORATORY/RADIOLOGY DATA:  Lab Results  Component Value Date   WBC 41.9* 03/26/2012   HGB 8.0* 03/26/2012   HCT 27.0* 03/26/2012   PLT 171 03/26/2012   GLUCOSE 105* 03/26/2012   ALKPHOS 84 03/26/2012   ALT 10 03/26/2012   AST 15 03/26/2012   NA 140 03/26/2012   K 4.1 03/26/2012   CL 107 03/26/2012   CREATININE 0.63 03/26/2012   BUN 10 03/26/2012   CO2 26 03/26/2012   HGBA1C  Value: 12.9 (NOTE)   The ADA recommends the following therapeutic goals for glycemic   control related to Hgb A1C measurement:   Goal of Therapy:   < 7.0% Hgb A1C   Action Suggested:  > 8.0% Hgb A1C   Ref:  Diabetes Care, 22, Suppl. 1, 1999* 08/14/2007    ASSESSMENT AND PLAN:    1. History of non-Hodgkin lymphoma. This is indolent, and she has been asymptomatic. She has has anemia that is thought to be from iron deficiency of chronic menstrual blood loss.   2. Iron deficiency anemia from menometrorrhagia:  Continue oral iron.  I recommended IV iron Feraheme.  In the future, if IV iron still does not work and she still has iron deficiency, we may consider hysterectomy.  If IV iron increases her iron level and she is still anemic; then we may consider repeating bone marrow biopsy to rule out anemia from lymphoma.  I explained to her potential side effects of Feraheme which include but not limited to infusion reaction, bone/muscle pain.  However, it will improve her iron store and Hgb better than oral iron.  She expressed informed understanding and wished to proceed with Feraheme today.  3. Diabetes mellitus, type II. She is on metformin 1000 mg p.o. b.i.d. per PCP.  She said that her Hgb A1C was 6.0.  It has been better control with intentional weight loss. 4. Hyperlipidemia: pravastatin 20 mg p.o. daily per PCP. 5. Follow-up: lab appointment [facility]2wk at the  [facility] to follow her anemia.  She has return appointment in about 4 months.

## 2012-04-09 ENCOUNTER — Other Ambulatory Visit (HOSPITAL_BASED_OUTPATIENT_CLINIC_OR_DEPARTMENT_OTHER): Payer: BC Managed Care – PPO | Admitting: Lab

## 2012-04-09 DIAGNOSIS — D500 Iron deficiency anemia secondary to blood loss (chronic): Secondary | ICD-10-CM

## 2012-04-09 DIAGNOSIS — C8307 Small cell B-cell lymphoma, spleen: Secondary | ICD-10-CM

## 2012-04-09 LAB — CBC WITH DIFFERENTIAL/PLATELET
Eosinophils Absolute: 0.1 10*3/uL (ref 0.0–0.5)
MCV: 71.8 fL — ABNORMAL LOW (ref 79.5–101.0)
MONO#: 1 10*3/uL — ABNORMAL HIGH (ref 0.1–0.9)
MONO%: 2.7 % (ref 0.0–14.0)
NEUT#: 5 10*3/uL (ref 1.5–6.5)
RBC: 4.29 10*6/uL (ref 3.70–5.45)
RDW: 25 % — ABNORMAL HIGH (ref 11.2–14.5)
WBC: 37.4 10*3/uL — ABNORMAL HIGH (ref 3.9–10.3)
lymph#: 31.2 10*3/uL — ABNORMAL HIGH (ref 0.9–3.3)

## 2012-04-09 LAB — TECHNOLOGIST REVIEW

## 2012-04-23 ENCOUNTER — Other Ambulatory Visit (HOSPITAL_BASED_OUTPATIENT_CLINIC_OR_DEPARTMENT_OTHER): Payer: BC Managed Care – PPO | Admitting: Lab

## 2012-04-23 DIAGNOSIS — D500 Iron deficiency anemia secondary to blood loss (chronic): Secondary | ICD-10-CM

## 2012-04-23 DIAGNOSIS — C8307 Small cell B-cell lymphoma, spleen: Secondary | ICD-10-CM

## 2012-04-23 LAB — CBC WITH DIFFERENTIAL/PLATELET
BASO%: 0.2 % (ref 0.0–2.0)
HCT: 28.9 % — ABNORMAL LOW (ref 34.8–46.6)
MCHC: 30.3 g/dL — ABNORMAL LOW (ref 31.5–36.0)
MONO#: 1.1 10*3/uL — ABNORMAL HIGH (ref 0.1–0.9)
NEUT#: 4.9 10*3/uL (ref 1.5–6.5)
NEUT%: 12.6 % — ABNORMAL LOW (ref 38.4–76.8)
WBC: 38.8 10*3/uL — ABNORMAL HIGH (ref 3.9–10.3)
lymph#: 32.6 10*3/uL — ABNORMAL HIGH (ref 0.9–3.3)

## 2012-04-23 LAB — TECHNOLOGIST REVIEW

## 2012-04-29 ENCOUNTER — Telehealth: Payer: Self-pay | Admitting: *Deleted

## 2012-04-29 ENCOUNTER — Other Ambulatory Visit: Payer: Self-pay | Admitting: Oncology

## 2012-04-29 DIAGNOSIS — C8307 Small cell B-cell lymphoma, spleen: Secondary | ICD-10-CM

## 2012-04-29 DIAGNOSIS — D500 Iron deficiency anemia secondary to blood loss (chronic): Secondary | ICD-10-CM

## 2012-04-29 NOTE — Telephone Encounter (Signed)
Pt left VM requesting results of labs from 04/23/12.

## 2012-04-29 NOTE — Telephone Encounter (Signed)
Called pt back w/ results per Dr. Gaylyn Rong.  Instructed to continue to take oral iron  BID w/ Vitamin C.  She verbalized understanding,  States she has been taking as directed.  She will return for labs on the 26th as scheduled.

## 2012-04-29 NOTE — Telephone Encounter (Signed)
Message copied by Wende Mott on Thu Apr 29, 2012  4:12 PM ------      Message from: HA, Raliegh Ip T      Created: Thu Apr 29, 2012  3:54 PM       Please inform patient that her anemia slightly improved with IV iron 1 month ago; but this last Hgb slightly decreased.  Please continue oral iron along with Vit C BID as tolerated.  I will send for iron level when she is here on 05/07/12 to see what happened.  Thanks.

## 2012-05-07 ENCOUNTER — Telehealth: Payer: Self-pay | Admitting: *Deleted

## 2012-05-07 ENCOUNTER — Other Ambulatory Visit (HOSPITAL_BASED_OUTPATIENT_CLINIC_OR_DEPARTMENT_OTHER): Payer: BC Managed Care – PPO | Admitting: Lab

## 2012-05-07 DIAGNOSIS — D500 Iron deficiency anemia secondary to blood loss (chronic): Secondary | ICD-10-CM

## 2012-05-07 DIAGNOSIS — C8307 Small cell B-cell lymphoma, spleen: Secondary | ICD-10-CM

## 2012-05-07 LAB — CBC WITH DIFFERENTIAL/PLATELET
BASO%: 0.4 % (ref 0.0–2.0)
EOS%: 0.3 % (ref 0.0–7.0)
LYMPH%: 83 % — ABNORMAL HIGH (ref 14.0–49.7)
MCHC: 31 g/dL — ABNORMAL LOW (ref 31.5–36.0)
MCV: 72.9 fL — ABNORMAL LOW (ref 79.5–101.0)
MONO%: 2.2 % (ref 0.0–14.0)
Platelets: 146 10*3/uL (ref 145–400)
RBC: 4.38 10*6/uL (ref 3.70–5.45)
WBC: 35.5 10*3/uL — ABNORMAL HIGH (ref 3.9–10.3)

## 2012-05-07 LAB — IRON AND TIBC
Iron: 31 ug/dL — ABNORMAL LOW (ref 42–145)
UIBC: 196 ug/dL (ref 125–400)

## 2012-05-07 LAB — TECHNOLOGIST REVIEW

## 2012-05-07 NOTE — Telephone Encounter (Signed)
Informed pt of Dr. Lodema Pilot message, her Hgb improving, continue to take oral iron/vitamin c and keep next lab appt as scheduled.  Pt verbalized understanding.

## 2012-05-07 NOTE — Telephone Encounter (Signed)
Message copied by Wende Mott on Fri May 07, 2012 11:23 AM ------      Message from: Jethro Bolus T      Created: Fri May 07, 2012  8:34 AM       Please call pt.  Her hgb has slightly improved from recent IV iron.  Continue oral iron/VitC IF she is able to tolerate.  Thanks.

## 2012-05-21 ENCOUNTER — Other Ambulatory Visit (HOSPITAL_BASED_OUTPATIENT_CLINIC_OR_DEPARTMENT_OTHER): Payer: BC Managed Care – PPO | Admitting: Lab

## 2012-05-21 ENCOUNTER — Telehealth: Payer: Self-pay | Admitting: *Deleted

## 2012-05-21 DIAGNOSIS — D500 Iron deficiency anemia secondary to blood loss (chronic): Secondary | ICD-10-CM

## 2012-05-21 DIAGNOSIS — C8307 Small cell B-cell lymphoma, spleen: Secondary | ICD-10-CM

## 2012-05-21 LAB — CBC WITH DIFFERENTIAL/PLATELET
BASO%: 0.3 % (ref 0.0–2.0)
EOS%: 0.1 % (ref 0.0–7.0)
HCT: 30.7 % — ABNORMAL LOW (ref 34.8–46.6)
LYMPH%: 85.1 % — ABNORMAL HIGH (ref 14.0–49.7)
MCH: 22.7 pg — ABNORMAL LOW (ref 25.1–34.0)
MCHC: 30.5 g/dL — ABNORMAL LOW (ref 31.5–36.0)
NEUT%: 12.3 % — ABNORMAL LOW (ref 38.4–76.8)
Platelets: 150 10*3/uL (ref 145–400)
RBC: 4.12 10*6/uL (ref 3.70–5.45)
lymph#: 32.3 10*3/uL — ABNORMAL HIGH (ref 0.9–3.3)

## 2012-05-21 NOTE — Telephone Encounter (Signed)
Message copied by Wende Mott on Fri May 21, 2012  2:59 PM ------      Message from: Jethro Bolus T      Created: Fri May 21, 2012 12:48 PM       Please call pt.  Her lymphoma (leukocytosis) is stable.  Her anemia is relatively stable (Hgb slightly lower than last time but better than 3 months ago).  Continue oral iron and VitC BID as tolerated.  Thanks.

## 2012-05-21 NOTE — Telephone Encounter (Signed)
Called pt w/ lab results and relayed Dr. Lodema Pilot note below that her Lymphoma and anemia are stable.  Continue oral iron and Vit C BID and keep next lab appt as scheduled later this month.  She verbalized understanding.

## 2012-06-04 ENCOUNTER — Telehealth: Payer: Self-pay | Admitting: *Deleted

## 2012-06-04 ENCOUNTER — Other Ambulatory Visit (HOSPITAL_BASED_OUTPATIENT_CLINIC_OR_DEPARTMENT_OTHER): Payer: BC Managed Care – PPO | Admitting: Lab

## 2012-06-04 DIAGNOSIS — C8307 Small cell B-cell lymphoma, spleen: Secondary | ICD-10-CM

## 2012-06-04 DIAGNOSIS — D500 Iron deficiency anemia secondary to blood loss (chronic): Secondary | ICD-10-CM

## 2012-06-04 LAB — CBC WITH DIFFERENTIAL/PLATELET
BASO%: 0.4 % (ref 0.0–2.0)
EOS%: 0.2 % (ref 0.0–7.0)
MCH: 23.3 pg — ABNORMAL LOW (ref 25.1–34.0)
MCV: 74.3 fL — ABNORMAL LOW (ref 79.5–101.0)
MONO%: 2.3 % (ref 0.0–14.0)
RBC: 4.33 10*6/uL (ref 3.70–5.45)
RDW: 21.6 % — ABNORMAL HIGH (ref 11.2–14.5)

## 2012-06-04 LAB — TECHNOLOGIST REVIEW

## 2012-06-04 NOTE — Telephone Encounter (Signed)
Called pt w/ results of CBC and informed Anemia improving per Dr. Gaylyn Rong,  Slight increase WBC from lymphoma.  Continue observation and report any new symptoms.. Pt denies any new symptoms or concerns.  Will keep next appt as scheduled.

## 2012-06-04 NOTE — Telephone Encounter (Signed)
Message copied by Wende Mott on Fri Jun 04, 2012  2:44 PM ------      Message from: Jethro Bolus T      Created: Fri Jun 04, 2012  8:54 AM       Please call pt.  Her Anemia has improved.  There is slight increase in WBC (from her indolent lymphoma).  Will continue observation.  Let me know if she has concerning constitutional symptoms.  Thanks.

## 2012-06-18 ENCOUNTER — Other Ambulatory Visit (HOSPITAL_BASED_OUTPATIENT_CLINIC_OR_DEPARTMENT_OTHER): Payer: BC Managed Care – PPO | Admitting: Lab

## 2012-06-18 DIAGNOSIS — D500 Iron deficiency anemia secondary to blood loss (chronic): Secondary | ICD-10-CM

## 2012-06-18 DIAGNOSIS — C8307 Small cell B-cell lymphoma, spleen: Secondary | ICD-10-CM

## 2012-06-18 LAB — CBC WITH DIFFERENTIAL/PLATELET
BASO%: 0.3 % (ref 0.0–2.0)
LYMPH%: 87.1 % — ABNORMAL HIGH (ref 14.0–49.7)
MCHC: 31.1 g/dL — ABNORMAL LOW (ref 31.5–36.0)
MONO#: 0.9 10*3/uL (ref 0.1–0.9)
RBC: 4.05 10*6/uL (ref 3.70–5.45)
WBC: 48.4 10*3/uL — ABNORMAL HIGH (ref 3.9–10.3)
lymph#: 42.2 10*3/uL — ABNORMAL HIGH (ref 0.9–3.3)

## 2012-06-22 ENCOUNTER — Telehealth: Payer: Self-pay

## 2012-06-22 NOTE — Telephone Encounter (Signed)
Message copied by Kallie Locks on Tue Jun 22, 2012 11:48 AM ------      Message from: Jethro Bolus T      Created: Tue Jun 22, 2012 11:05 AM       Please call pt.  Her chronic lymphoma and anemia are stable.  Continue watchful observation.  Thanks.

## 2012-06-22 NOTE — Telephone Encounter (Signed)
Message copied by Kallie Locks on Tue Jun 22, 2012 11:55 AM ------      Message from: Jethro Bolus T      Created: Tue Jun 22, 2012 11:05 AM       Please call pt.  Her chronic lymphoma and anemia are stable.  Continue watchful observation.  Thanks.

## 2012-07-02 ENCOUNTER — Other Ambulatory Visit (HOSPITAL_BASED_OUTPATIENT_CLINIC_OR_DEPARTMENT_OTHER): Payer: BC Managed Care – PPO | Admitting: Lab

## 2012-07-02 ENCOUNTER — Telehealth: Payer: Self-pay | Admitting: *Deleted

## 2012-07-02 DIAGNOSIS — D500 Iron deficiency anemia secondary to blood loss (chronic): Secondary | ICD-10-CM

## 2012-07-02 DIAGNOSIS — C8307 Small cell B-cell lymphoma, spleen: Secondary | ICD-10-CM

## 2012-07-02 LAB — CBC WITH DIFFERENTIAL/PLATELET
BASO%: 0.1 % (ref 0.0–2.0)
HCT: 32.8 % — ABNORMAL LOW (ref 34.8–46.6)
HGB: 9.8 g/dL — ABNORMAL LOW (ref 11.6–15.9)
MCHC: 29.9 g/dL — ABNORMAL LOW (ref 31.5–36.0)
MONO#: 1.7 10*3/uL — ABNORMAL HIGH (ref 0.1–0.9)
NEUT%: 10.6 % — ABNORMAL LOW (ref 38.4–76.8)
WBC: 49.8 10*3/uL — ABNORMAL HIGH (ref 3.9–10.3)
lymph#: 42.7 10*3/uL — ABNORMAL HIGH (ref 0.9–3.3)

## 2012-07-02 NOTE — Telephone Encounter (Signed)
I did leave a message.  Please relay to her what I wrote on the phone note.  Thanks.

## 2012-07-02 NOTE — Telephone Encounter (Signed)
Called Desiree [name] back and left her a VM, instructed her to listen to VM from Dr. Gaylyn Rong.  Repeated Dr. Lodema Pilot message that her counts are stable,  She can cancel lab only appt on 10/4 and keep next appt on 10/8 as scheduled.  Asked her to call back if any questions.

## 2012-07-02 NOTE — Telephone Encounter (Signed)
Pt left VM states missed call from our office this morning.  She had labs this morning. She can be reached at [phone].

## 2012-07-08 ENCOUNTER — Emergency Department (HOSPITAL_COMMUNITY)
Admission: EM | Admit: 2012-07-08 | Discharge: 2012-07-08 | Disposition: A | Discharge status: Home / Self Care | Payer: BC Managed Care – PPO | Source: Home / Self Care | Attending: Family Medicine | Admitting: Family Medicine

## 2012-07-08 ENCOUNTER — Encounter (HOSPITAL_COMMUNITY): Payer: Self-pay | Admitting: *Deleted

## 2012-07-08 DIAGNOSIS — L0390 Cellulitis, unspecified: Secondary | ICD-10-CM

## 2012-07-08 MED ORDER — DOXYCYCLINE HYCLATE 100 MG PO CAPS: 100.0000 mg | ORAL_CAPSULE | Freq: Two times a day (BID) | ORAL | Status: DC

## 2012-07-08 NOTE — ED Provider Notes (Signed)
History     CSN: [id number]  Arrival date & time 07/08/12  1747   First MD Initiated Contact with Patient 07/08/12 1752      Chief Complaint  Patient presents with  . Abscess    (Consider location/radiation/quality/duration/timing/severity/associated sxs/prior treatment) Patient is a 46 y.o. female presenting with abscess. The history is provided by the patient.  Abscess  This is a new problem. The current episode started yesterday. The onset was gradual. The problem has been gradually worsening. The abscess is present on the trunk. The problem is moderate. The abscess is characterized by redness and painfulness.    Past Medical History  Diagnosis Date  . Leukocytosis 07/27/2011  . 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 DM 08/20/2007  . Hypertension   . Non Hodgkin's lymphoma   . Iron deficiency anemia due to chronic blood loss     History reviewed. No pertinent past surgical history.  Family History  Problem Relation Age of Onset  . Family history unknown: Yes    History  Substance Use Topics  . Smoking status: Former Games developer  . Smokeless tobacco: Not on file  . Alcohol Use: No    OB History    Grav Para Term Preterm Abortions TAB SAB Ect Mult Living                  Review of Systems  Constitutional: Negative.   Skin: Positive for rash.    Allergies  Erythromycin and Tolnaftate  Home Medications   Current Outpatient Rx  Name Route Sig Dispense Refill  . DOXYCYCLINE HYCLATE 100 MG PO CAPS Oral Take 1 capsule (100 mg total) by mouth 2 (two) times daily. 20 capsule 0  . OMEGA-3 FATTY ACIDS 1000 MG PO CAPS Oral Take 1 g by mouth daily.      . IBUPROFEN 200 MG PO TABS Oral Take 200 mg by mouth every 6 (six) hours as needed.      Marland Kitchen METFORMIN HCL 1000 MG PO TABS Oral Take 1,000 mg by mouth 2 (two) times daily with a meal.      . NAPROXEN 375 MG PO TABS Oral Take 1 tablet (375 mg total) by mouth 2 (two) times daily. 20 tablet 0  .  PRAVASTATIN SODIUM 20 MG PO TABS Oral Take 20 mg by mouth daily.        BP 116/66  Pulse 92  Temp 97.5 F (36.4 C) (Oral)  Resp 18  SpO2 100%  LMP 11/14/2011  Physical Exam  Nursing note and vitals reviewed. Constitutional: She is oriented to person, place, and time. She appears well-developed and well-nourished.  Neurological: She is alert and oriented to person, place, and time.  Skin: Skin is warm and dry. Rash noted.       Indurated erythematous area to left chest, nonfluctuant, no drainage.    ED Course  Procedures (including critical care time)  Labs Reviewed - No data to display No results found.   1. Cellulitis       MDM         Linna Hoff, MD 07/08/12 [phone]

## 2012-07-08 NOTE — ED Notes (Signed)
Abscess under left axilla

## 2012-07-16 ENCOUNTER — Other Ambulatory Visit: Payer: Self-pay | Admitting: Lab

## 2012-07-30 ENCOUNTER — Other Ambulatory Visit (HOSPITAL_BASED_OUTPATIENT_CLINIC_OR_DEPARTMENT_OTHER): Payer: Self-pay | Admitting: Lab

## 2012-07-30 ENCOUNTER — Ambulatory Visit (HOSPITAL_BASED_OUTPATIENT_CLINIC_OR_DEPARTMENT_OTHER): Payer: BC Managed Care – PPO | Admitting: Oncology

## 2012-07-30 ENCOUNTER — Telehealth: Payer: Self-pay | Admitting: Oncology

## 2012-07-30 ENCOUNTER — Encounter: Payer: Self-pay | Admitting: Oncology

## 2012-07-30 VITALS — BP 110/76 | HR 89 | Temp 97.3°F | Resp 20 | Ht 67.5 in | Wt 240.3 lb

## 2012-07-30 DIAGNOSIS — D500 Iron deficiency anemia secondary to blood loss (chronic): Secondary | ICD-10-CM

## 2012-07-30 DIAGNOSIS — N920 Excessive and frequent menstruation with regular cycle: Secondary | ICD-10-CM

## 2012-07-30 DIAGNOSIS — C8307 Small cell B-cell lymphoma, spleen: Secondary | ICD-10-CM

## 2012-07-30 LAB — COMPREHENSIVE METABOLIC PANEL (CC13)
CO2: 25 mEq/L (ref 22–29)
Calcium: 9.2 mg/dL (ref 8.4–10.4)
Chloride: 107 mEq/L (ref 98–107)
Creatinine: 0.6 mg/dL (ref 0.6–1.1)
Glucose: 89 mg/dl (ref 70–99)
Total Bilirubin: 0.5 mg/dL (ref 0.20–1.20)

## 2012-07-30 LAB — CBC WITH DIFFERENTIAL/PLATELET
Basophils Absolute: 0.1 10*3/uL (ref 0.0–0.1)
Eosinophils Absolute: 0.1 10*3/uL (ref 0.0–0.5)
HCT: 31.3 % — ABNORMAL LOW (ref 34.8–46.6)
HGB: 9.8 g/dL — ABNORMAL LOW (ref 11.6–15.9)
MCH: 23.4 pg — ABNORMAL LOW (ref 25.1–34.0)
MONO#: 1 10*3/uL — ABNORMAL HIGH (ref 0.1–0.9)
NEUT#: 4.5 10*3/uL (ref 1.5–6.5)
NEUT%: 9.5 % — ABNORMAL LOW (ref 38.4–76.8)
lymph#: 41.6 10*3/uL — ABNORMAL HIGH (ref 0.9–3.3)

## 2012-07-30 LAB — IRON AND TIBC
%SAT: 13 % — ABNORMAL LOW (ref 20–55)
TIBC: 267 ug/dL (ref 250–470)

## 2012-07-30 LAB — TECHNOLOGIST REVIEW

## 2012-07-30 NOTE — Telephone Encounter (Signed)
Printed and gv appt schedule for Nov thru Feb 2014

## 2012-07-30 NOTE — Progress Notes (Signed)
[facility]  [phone] [phone]   OFFICE PROGRESS NOTE   Cc:  Thora Lance, MD  DIAGNOSIS:  1. 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 She has no adenopathy but she has splenomegaly and bone marrow involvement. 2. Iron deficiency anemia from history of menometrorrhagia.   PAST THERAPY/ CURRENT THERAPY:Observation for lymphoma and oral iron replacement for iron deficiency anemia.   INTERVAL HISTORY: Desiree [name] 46 y.o. female returns for regular follow up.  She is here by herself. She received IV iron in June without any difficulties. She has been taking iron BID with VitC.  However, she still has anemia.  She still has menometrorrhagia.  Her LMP was about 1 month ago and heavy; however, her periods the 2 months prior were much lighter. She has been trying to lose weight actively by eating no sweets and exercise.    Patient denies fatigue, headache, visual changes, confusion, drenching night sweats, palpable lymph node swelling, mucositis, odynophagia, dysphagia, nausea vomiting, jaundice, chest pain, palpitation, shortness of breath, dyspnea on exertion, productive cough, gum bleeding, epistaxis, hematemesis, hemoptysis, abdominal pain, abdominal swelling, early satiety, melena, hematochezia, hematuria, skin rash, spontaneous bleeding, joint swelling, joint pain, heat or cold intolerance, bowel bladder incontinence, back pain, focal motor weakness, paresthesia, depression, suicidal or homocidal ideation, feeling hopelessness.   Past Medical History  Diagnosis Date  . Leukocytosis 07/27/2011  . 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 DM 08/20/2007  . Hypertension   . Non Hodgkin's lymphoma   . Iron deficiency anemia due to chronic blood loss     History reviewed. No pertinent past surgical  history.  Current Outpatient Prescriptions  Medication Sig Dispense Refill  . ferrous sulfate 325 (65 FE) MG tablet Take 325 mg by mouth 2 (two) times daily.      . vitamin C (ASCORBIC ACID) 500 MG tablet Take 500 mg by mouth 2 (two) times daily.      . fish oil-omega-3 fatty acids 1000 MG capsule Take 1 g by mouth daily.        Marland Kitchen ibuprofen (ADVIL,MOTRIN) 200 MG tablet Take 200 mg by mouth every 6 (six) hours as needed.        . metFORMIN (GLUCOPHAGE) 1000 MG tablet Take 1,000 mg by mouth 2 (two) times daily with a meal.        . pravastatin (PRAVACHOL) 20 MG tablet Take 20 mg by mouth daily.          ALLERGIES:  is allergic to erythromycin and tolnaftate.  REVIEW OF SYSTEMS:  The rest of the 14-point review of system was negative.   Filed Vitals:   07/30/12 0914  BP: 110/76  Pulse: 89  Temp: 97.3 F (36.3 C)  Resp: 20   Wt Readings from Last 3 Encounters:  07/30/12 240 lb 4.8 oz (108.999 kg)  03/26/12 247 lb 14.4 oz (112.447 kg)  10/01/11 283 lb 8 oz (128.595 kg)   ECOG Performance status: 0  PHYSICAL EXAMINATION:   General:  Mildly obese woman, in no acute distress.  Eyes:  no scleral icterus.  ENT:  There were no oropharyngeal lesions.  Neck was without thyromegaly.  Lymphatics:  Negative cervical, supraclavicular or axillary adenopathy.  Respiratory: lungs were clear bilaterally without wheezing or crackles.  Cardiovascular:  Regular rate and rhythm, S1/S2, without murmur, rub or gallop.  There was no pedal edema.  GI:  abdomen was soft, flat, nontender, nondistended, without organomegaly.  Muscoloskeletal:  no spinal tenderness of palpation of vertebral spine.  Skin exam was without echymosis, petichae.  Neuro exam was nonfocal.  Patient was able to get on and off exam table without assistance.  Gait was normal.  Patient was alerted and oriented.  Attention was good.   Language was appropriate.  Mood was normal without depression.  Speech was not pressured.  Thought content was  not tangential.     LABORATORY/RADIOLOGY DATA:  Lab Results  Component Value Date   WBC 47.3* 07/30/2012   HGB 9.8* 07/30/2012   HCT 31.3* 07/30/2012   PLT 160 07/30/2012   GLUCOSE 89 07/30/2012   ALKPHOS 94 07/30/2012   ALT 8 07/30/2012   AST 13 07/30/2012   NA 140 07/30/2012   K 4.2 07/30/2012   CL 107 07/30/2012   CREATININE 0.6 07/30/2012   BUN 15.0 07/30/2012   CO2 25 07/30/2012   HGBA1C  Value: 12.9 (NOTE)   The ADA recommends the following therapeutic goals for glycemic   control related to Hgb A1C measurement:   Goal of Therapy:   < 7.0% Hgb A1C   Action Suggested:  > 8.0% Hgb A1C   Ref:  Diabetes Care, 22, Suppl. 1, 1999* 08/14/2007    ASSESSMENT AND PLAN:    1. History of non-Hodgkin lymphoma. This is indolent, and she has been asymptomatic. She has has anemia that is thought to be from iron deficiency of chronic menstrual blood loss.   2. Iron deficiency anemia from menometrorrhagia:  Continue oral iron and Vitamin C. Iron studies are pending today and we will decide if she needs additional IV iron once the results return. In the future, if IV iron still does not work and she still has iron deficiency, we may consider hysterectomy.  If IV iron increases her iron level and she is still anemic; then we may consider repeating bone marrow biopsy to rule out anemia from lymphoma.   3. Diabetes mellitus, type II. She is on metformin 1000 mg p.o. b.i.d. per PCP.  She said that her Hgb A1C was 6.0.  It has been better control with intentional weight loss. 4. Hyperlipidemia: pravastatin 20 mg p.o. daily per PCP. 5. Follow-up: [facility] to follow her anemia.  She has return appointment in about 4 months.

## 2012-08-27 ENCOUNTER — Other Ambulatory Visit (HOSPITAL_BASED_OUTPATIENT_CLINIC_OR_DEPARTMENT_OTHER): Payer: BC Managed Care – PPO | Admitting: Lab

## 2012-08-27 ENCOUNTER — Telehealth: Payer: Self-pay | Admitting: *Deleted

## 2012-08-27 DIAGNOSIS — D500 Iron deficiency anemia secondary to blood loss (chronic): Secondary | ICD-10-CM

## 2012-08-27 DIAGNOSIS — C8307 Small cell B-cell lymphoma, spleen: Secondary | ICD-10-CM

## 2012-08-27 LAB — CBC WITH DIFFERENTIAL/PLATELET
BASO%: 0.2 % (ref 0.0–2.0)
Basophils Absolute: 0.1 10*3/uL (ref 0.0–0.1)
EOS%: 0.2 % (ref 0.0–7.0)
HCT: 31 % — ABNORMAL LOW (ref 34.8–46.6)
HGB: 9.8 g/dL — ABNORMAL LOW (ref 11.6–15.9)
MCHC: 31.4 g/dL — ABNORMAL LOW (ref 31.5–36.0)
MONO#: 1.2 10*3/uL — ABNORMAL HIGH (ref 0.1–0.9)
NEUT%: 9 % — ABNORMAL LOW (ref 38.4–76.8)
RDW: 18.3 % — ABNORMAL HIGH (ref 11.2–14.5)
WBC: 60.2 10*3/uL (ref 3.9–10.3)
lymph#: 53.3 10*3/uL — ABNORMAL HIGH (ref 0.9–3.3)

## 2012-08-27 NOTE — Telephone Encounter (Signed)
Called pt and informed of Lab results and Kristin's message.  Pt denies any symptoms,  States feels the same as always.  Verbalized understanding and will keep lab appt as scheduled in one month.

## 2012-08-27 NOTE — Telephone Encounter (Signed)
Message copied by Wende Mott on Fri Aug 27, 2012  1:26 PM ------      Message from: Jethro Bolus T      Created: Fri Aug 27, 2012  9:47 AM       Please call pt.  Her WBC and lymphocytes increased (from chronic indolent lymphoma); but Hgb and plt still stable.  Continue observation unless symptomatic.

## 2012-09-17 ENCOUNTER — Other Ambulatory Visit: Payer: Self-pay | Admitting: Lab

## 2012-09-24 ENCOUNTER — Other Ambulatory Visit (HOSPITAL_BASED_OUTPATIENT_CLINIC_OR_DEPARTMENT_OTHER): Payer: BC Managed Care – PPO | Admitting: Lab

## 2012-09-24 DIAGNOSIS — C8307 Small cell B-cell lymphoma, spleen: Secondary | ICD-10-CM

## 2012-09-24 DIAGNOSIS — D500 Iron deficiency anemia secondary to blood loss (chronic): Secondary | ICD-10-CM

## 2012-09-24 LAB — CBC WITH DIFFERENTIAL/PLATELET
Basophils Absolute: 0.2 10*3/uL — ABNORMAL HIGH (ref 0.0–0.1)
EOS%: 0.1 % (ref 0.0–7.0)
Eosinophils Absolute: 0.1 10*3/uL (ref 0.0–0.5)
HCT: 30.3 % — ABNORMAL LOW (ref 34.8–46.6)
HGB: 9.4 g/dL — ABNORMAL LOW (ref 11.6–15.9)
MCH: 22.9 pg — ABNORMAL LOW (ref 25.1–34.0)
MCV: 74.1 fL — ABNORMAL LOW (ref 79.5–101.0)
MONO%: 1.6 % (ref 0.0–14.0)
NEUT#: 5.6 10*3/uL (ref 1.5–6.5)
NEUT%: 8.9 % — ABNORMAL LOW (ref 38.4–76.8)
Platelets: 172 10*3/uL (ref 145–400)
RDW: 18.9 % — ABNORMAL HIGH (ref 11.2–14.5)

## 2012-10-15 ENCOUNTER — Other Ambulatory Visit: Payer: Self-pay | Admitting: Lab

## 2012-10-22 ENCOUNTER — Other Ambulatory Visit: Payer: Self-pay | Admitting: Oncology

## 2012-10-22 ENCOUNTER — Other Ambulatory Visit: Payer: Self-pay | Admitting: Lab

## 2012-10-22 ENCOUNTER — Telehealth: Payer: Self-pay | Admitting: *Deleted

## 2012-10-22 DIAGNOSIS — C8307 Small cell B-cell lymphoma, spleen: Secondary | ICD-10-CM

## 2012-10-22 DIAGNOSIS — D500 Iron deficiency anemia secondary to blood loss (chronic): Secondary | ICD-10-CM

## 2012-10-22 LAB — MANUAL DIFFERENTIAL
Band Neutrophils: 0 % (ref 0–10)
Basophil: 0 % (ref 0–2)
EOS: 0 % (ref 0–7)
MONO: 1 % (ref 0–14)
Myelocytes: 0 % (ref 0–0)
Other Cell: 0 % (ref 0–0)
SEG: 7 % — ABNORMAL LOW (ref 38–77)
Smudge Cells: 0
nRBC: 0 % (ref 0–0)

## 2012-10-22 LAB — CBC WITH DIFFERENTIAL/PLATELET
MCH: 22.2 pg — ABNORMAL LOW (ref 25.1–34.0)
MCV: 75.6 fL — ABNORMAL LOW (ref 79.5–101.0)
Platelets: 158 10*3/uL (ref 145–400)
RBC: 4.14 10*6/uL (ref 3.70–5.45)
WBC: 59.1 10*3/uL (ref 3.9–10.3)

## 2012-10-22 NOTE — Telephone Encounter (Signed)
Called pt w/ lab results and message from Dr. Gaylyn Rong below.  She verbalized understanding and confirmed she is taking oral iron twice daily.  She confirmed next appt on 11/19/12.

## 2012-10-22 NOTE — Telephone Encounter (Signed)
Message copied by Wende Mott on Fri Oct 22, 2012  3:38 PM ------      Message from: HA, Raliegh Ip T      Created: Fri Oct 22, 2012  9:15 AM       Please call pt.  Her chronic lymphoma is stable.  Slight anemia.  Please remind her to take oral iron as tolerated.  I'll check her iron level next time to see if she'll need IV iron.  Thanks.

## 2012-11-19 ENCOUNTER — Ambulatory Visit (HOSPITAL_BASED_OUTPATIENT_CLINIC_OR_DEPARTMENT_OTHER): Payer: BC Managed Care – PPO | Admitting: Oncology

## 2012-11-19 ENCOUNTER — Other Ambulatory Visit (HOSPITAL_BASED_OUTPATIENT_CLINIC_OR_DEPARTMENT_OTHER): Payer: BC Managed Care – PPO | Admitting: Lab

## 2012-11-19 ENCOUNTER — Telehealth: Payer: Self-pay | Admitting: Oncology

## 2012-11-19 VITALS — BP 117/77 | HR 102 | Temp 98.1°F | Resp 20 | Ht 67.5 in | Wt 250.7 lb

## 2012-11-19 DIAGNOSIS — C8307 Small cell B-cell lymphoma, spleen: Secondary | ICD-10-CM

## 2012-11-19 DIAGNOSIS — D509 Iron deficiency anemia, unspecified: Secondary | ICD-10-CM

## 2012-11-19 DIAGNOSIS — D500 Iron deficiency anemia secondary to blood loss (chronic): Secondary | ICD-10-CM

## 2012-11-19 LAB — COMPREHENSIVE METABOLIC PANEL (CC13)
ALT: 12 U/L (ref 0–55)
AST: 13 U/L (ref 5–34)
Alkaline Phosphatase: 101 U/L (ref 40–150)
Glucose: 107 mg/dl — ABNORMAL HIGH (ref 70–99)
Sodium: 141 mEq/L (ref 136–145)
Total Bilirubin: 0.48 mg/dL (ref 0.20–1.20)
Total Protein: 7 g/dL (ref 6.4–8.3)

## 2012-11-19 LAB — IRON AND TIBC
%SAT: 8 % — ABNORMAL LOW (ref 20–55)
Iron: 25 ug/dL — ABNORMAL LOW (ref 42–145)

## 2012-11-19 LAB — CBC WITH DIFFERENTIAL/PLATELET
BASO%: 0.2 % (ref 0.0–2.0)
EOS%: 0.3 % (ref 0.0–7.0)
LYMPH%: 88.8 % — ABNORMAL HIGH (ref 14.0–49.7)
MCHC: 30.6 g/dL — ABNORMAL LOW (ref 31.5–36.0)
MCV: 72.7 fL — ABNORMAL LOW (ref 79.5–101.0)
MONO%: 1.6 % (ref 0.0–14.0)
Platelets: 165 10*3/uL (ref 145–400)
RBC: 4.45 10*6/uL (ref 3.70–5.45)

## 2012-11-19 NOTE — Patient Instructions (Addendum)
1.  Diagnosis:  Indolent, chronic B-cell lymphoma. 2.  Status:  Stable.  3.  Recommendation:  Again, watchful observation.  No active indication for chemo yet (severe constitutional symptoms, recurrent severe infections, severe enlargement of lymph nodes, or severe decrease in red blood cells or platelet).  4.  Follow up:  Lab only appointment in about 3 months.  RV in about 6 months.

## 2012-11-19 NOTE — Progress Notes (Signed)
[facility]  [phone] Fax:(336) [phone]   OFFICE PROGRESS NOTE   Cc:  Thora Lance, MD  DIAGNOSIS:  1. 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 She has no adenopathy but she has splenomegaly and bone marrow involvement. 2. Iron deficiency anemia from history of menometrorrhagia.   PAST THERAPY/ CURRENT THERAPY:Observation for lymphoma and oral iron replacement for iron deficiency anemia.   INTERVAL HISTORY: Desiree [name] 47 y.o. female returns for regular follow up by herself.  She reported having recurrent boils.  It happens about once every 6-8 weeks these past 6 months.  They are in her nose, right axilla, right buttock, labia.  They normally respond within 3-5 days of oral antibiotics such as Augmentin.  Currently, there is no active boil.  She denied other types of infection such as pneumonia, UTI, diarrhea.  She still works full time.  She has good appetite.  She has been trying to control her weight.  She denied palpable nodes, skin rash, fatigue, night sweat, bleeding symptoms.  She still has heavy period.  She is taking oral iron with constipation.  The rest of the 14-point review of system was negative.    Past Medical History  Diagnosis Date  . Leukocytosis 07/27/2011  . 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 DM 08/20/2007  . Hypertension   . Non Hodgkin's lymphoma   . Iron deficiency anemia due to chronic blood loss     No past surgical history on file.  Current Outpatient Prescriptions  Medication Sig Dispense Refill  . ferrous sulfate 325 (65 FE) MG tablet Take 325 mg by mouth 2 (two) times daily.      . fish oil-omega-3 fatty acids 1000 MG capsule Take 1 g by mouth daily.        Marland Kitchen ibuprofen (ADVIL,MOTRIN) 200 MG tablet Take 200 mg by mouth every 6 (six) hours as needed.        . metFORMIN  (GLUCOPHAGE) 1000 MG tablet Take 1,000 mg by mouth 2 (two) times daily with a meal.        . pravastatin (PRAVACHOL) 20 MG tablet Take 20 mg by mouth daily.        . vitamin C (ASCORBIC ACID) 500 MG tablet Take 500 mg by mouth 2 (two) times daily.        ALLERGIES:  is allergic to erythromycin and tolnaftate.  REVIEW OF SYSTEMS:  The rest of the 14-point review of system was negative.   Filed Vitals:   11/19/12 0859  BP: 117/77  Pulse: 102  Temp: 98.1 F (36.7 C)  Resp: 20   Wt Readings from Last 3 Encounters:  11/19/12 250 lb 11.2 oz (113.717 kg)  07/30/12 240 lb 4.8 oz (108.999 kg)  03/26/12 247 lb 14.4 oz (112.447 kg)   ECOG Performance status: 0  PHYSICAL EXAMINATION:   General:  Mildly obese woman, in no acute distress.  Eyes:  no scleral icterus.  ENT:  There were no oropharyngeal lesions.  Neck was without thyromegaly.  Lymphatics:  Negative cervical, supraclavicular or axillary adenopathy.  Respiratory: lungs were clear bilaterally without wheezing or crackles.  Cardiovascular:  Regular rate and rhythm, S1/S2, without murmur, rub or gallop.  There was no pedal edema.  GI:  abdomen was soft, flat, nontender, nondistended, without organomegaly.  Muscoloskeletal:  no spinal tenderness of palpation of vertebral spine.  Skin exam was without echymosis, petichae.  Neuro exam was nonfocal.  Patient was able to get on and off exam table without assistance.  Gait was normal.  Patient was alerted and oriented.  Attention was good.   Language was appropriate.  Mood was normal without depression.  Speech was not pressured.  Thought content was not tangential.     LABORATORY/RADIOLOGY DATA:  Lab Results  Component Value Date   WBC 68.4* 11/19/2012   HGB 9.9* 11/19/2012   HCT 32.3* 11/19/2012   PLT 165 11/19/2012   GLUCOSE 107* 11/19/2012   ALKPHOS 101 11/19/2012   ALT 12 11/19/2012   AST 13 11/19/2012   NA 141 11/19/2012   K 4.3 11/19/2012   CL 106 11/19/2012   CREATININE 0.7 11/19/2012   BUN 12.2  11/19/2012   CO2 25 11/19/2012   HGBA1C  Value: 12.9 (NOTE)   The ADA recommends the following therapeutic goals for glycemic   control related to Hgb A1C measurement:   Goal of Therapy:   < 7.0% Hgb A1C   Action Suggested:  > 8.0% Hgb A1C   Ref:  Diabetes Care, 22, Suppl. 1, 1999* 08/14/2007    ASSESSMENT AND PLAN:    1. History of non-Hodgkin lymphoma.   -Stable.  -Recommendation:  Again, watchful observation.  No active indication for chemo yet (severe constitutional symptoms, recurrent severe infections, severe enlargement of lymph nodes, or severe decrease in red blood cells or platelet).  If her boils recur more frequently without periods without boils or she has recurrent infection elsewhere, we may consider starting chemo such as RCVP or BR for indolent B-cell lymphoma.  Ms. Tineo expressed informed understanding and was OK with watchful observation for now.  - Follow up:  Lab only appointment in about 3 months.  RV in about 6 months.    2. Iron deficiency anemia from menometrorrhagia:  Continue oral iron 3. Diabetes mellitus, type II. She is on metformin 1000 mg p.o. b.i.d. per PCP.  4. Hyperlipidemia: pravastatin 20 mg p.o. daily per PCP.

## 2012-11-19 NOTE — Telephone Encounter (Signed)
gv and printed pt appt schedule for may and aug

## 2012-11-27 ENCOUNTER — Other Ambulatory Visit: Payer: Self-pay

## 2013-02-16 ENCOUNTER — Other Ambulatory Visit (HOSPITAL_BASED_OUTPATIENT_CLINIC_OR_DEPARTMENT_OTHER): Payer: BC Managed Care – PPO | Admitting: Lab

## 2013-02-16 DIAGNOSIS — C8307 Small cell B-cell lymphoma, spleen: Secondary | ICD-10-CM

## 2013-02-16 DIAGNOSIS — D500 Iron deficiency anemia secondary to blood loss (chronic): Secondary | ICD-10-CM

## 2013-02-16 LAB — TECHNOLOGIST REVIEW

## 2013-02-16 LAB — CBC WITH DIFFERENTIAL/PLATELET
BASO%: 0.3 % (ref 0.0–2.0)
EOS%: 0.1 % (ref 0.0–7.0)
HCT: 27.3 % — ABNORMAL LOW (ref 34.8–46.6)
MCHC: 29.8 g/dL — ABNORMAL LOW (ref 31.5–36.0)
MCV: 72.3 fL — ABNORMAL LOW (ref 79.5–101.0)
MONO#: 1.3 10*3/uL — ABNORMAL HIGH (ref 0.1–0.9)
NEUT#: 6.1 10*3/uL (ref 1.5–6.5)
NEUT%: 8.3 % — ABNORMAL LOW (ref 38.4–76.8)
Platelets: 154 10*3/uL (ref 145–400)
WBC: 73.6 10*3/uL (ref 3.9–10.3)
lymph#: 66 10*3/uL — ABNORMAL HIGH (ref 0.9–3.3)

## 2013-03-23 ENCOUNTER — Other Ambulatory Visit: Payer: Self-pay

## 2013-03-23 ENCOUNTER — Other Ambulatory Visit: Payer: Self-pay | Admitting: Family Medicine

## 2013-03-23 DIAGNOSIS — Z1231 Encounter for screening mammogram for malignant neoplasm of breast: Secondary | ICD-10-CM

## 2013-04-21 ENCOUNTER — Ambulatory Visit
Admission: RE | Admit: 2013-04-21 | Discharge: 2013-04-21 | Disposition: A | Discharge status: Home / Self Care | Payer: BC Managed Care – PPO | Source: Ambulatory Visit

## 2013-04-21 DIAGNOSIS — Z1231 Encounter for screening mammogram for malignant neoplasm of breast: Secondary | ICD-10-CM

## 2013-04-22 ENCOUNTER — Other Ambulatory Visit: Payer: Self-pay | Admitting: Family Medicine

## 2013-04-22 DIAGNOSIS — R928 Other abnormal and inconclusive findings on diagnostic imaging of breast: Secondary | ICD-10-CM

## 2013-05-04 ENCOUNTER — Ambulatory Visit
Admission: RE | Admit: 2013-05-04 | Discharge: 2013-05-04 | Disposition: A | Discharge status: Home / Self Care | Payer: BC Managed Care – PPO | Source: Ambulatory Visit | Attending: Family Medicine | Admitting: Family Medicine

## 2013-05-04 DIAGNOSIS — R928 Other abnormal and inconclusive findings on diagnostic imaging of breast: Secondary | ICD-10-CM

## 2013-05-19 ENCOUNTER — Ambulatory Visit (HOSPITAL_BASED_OUTPATIENT_CLINIC_OR_DEPARTMENT_OTHER): Payer: BC Managed Care – PPO | Admitting: Hematology and Oncology

## 2013-05-19 ENCOUNTER — Other Ambulatory Visit (HOSPITAL_BASED_OUTPATIENT_CLINIC_OR_DEPARTMENT_OTHER): Payer: BC Managed Care – PPO | Admitting: Lab

## 2013-05-19 ENCOUNTER — Telehealth: Payer: Self-pay | Admitting: Hematology and Oncology

## 2013-05-19 ENCOUNTER — Ambulatory Visit: Payer: BC Managed Care – PPO | Admitting: Lab

## 2013-05-19 VITALS — BP 110/77 | HR 99 | Temp 97.0°F | Resp 18 | Ht 67.5 in | Wt 250.3 lb

## 2013-05-19 DIAGNOSIS — C8307 Small cell B-cell lymphoma, spleen: Secondary | ICD-10-CM

## 2013-05-19 DIAGNOSIS — D72829 Elevated white blood cell count, unspecified: Secondary | ICD-10-CM

## 2013-05-19 DIAGNOSIS — D500 Iron deficiency anemia secondary to blood loss (chronic): Secondary | ICD-10-CM

## 2013-05-19 DIAGNOSIS — C8589 Other specified types of non-Hodgkin lymphoma, extranodal and solid organ sites: Secondary | ICD-10-CM

## 2013-05-19 DIAGNOSIS — E119 Type 2 diabetes mellitus without complications: Secondary | ICD-10-CM

## 2013-05-19 DIAGNOSIS — D508 Other iron deficiency anemias: Secondary | ICD-10-CM

## 2013-05-19 LAB — CBC WITH DIFFERENTIAL/PLATELET
BASO%: 0.2 % (ref 0.0–2.0)
Eosinophils Absolute: 0.1 10*3/uL (ref 0.0–0.5)
HCT: 28.7 % — ABNORMAL LOW (ref 34.8–46.6)
MCHC: 30.2 g/dL — ABNORMAL LOW (ref 31.5–36.0)
MONO#: 1.7 10*3/uL — ABNORMAL HIGH (ref 0.1–0.9)
NEUT#: 5.6 10*3/uL (ref 1.5–6.5)
NEUT%: 8.1 % — ABNORMAL LOW (ref 38.4–76.8)
RBC: 4.03 10*6/uL (ref 3.70–5.45)
WBC: 69.3 10*3/uL (ref 3.9–10.3)
lymph#: 61.7 10*3/uL — ABNORMAL HIGH (ref 0.9–3.3)

## 2013-05-19 LAB — FERRITIN CHCC: Ferritin: 70 ng/ml (ref 9–269)

## 2013-05-19 LAB — TECHNOLOGIST REVIEW

## 2013-05-19 NOTE — Telephone Encounter (Signed)
gv and printeda ppt sched and avs for pt....sent pt back to the lab....  °

## 2013-05-19 NOTE — Progress Notes (Signed)
ID: Gavin Pound OB: [birth year]  MR#: [id number]  WUJ#:[id number]  [facility]  [phone] [phone]    OFFICE PROGRESS NOTE   Cc: Thora Lance, MD   DIAGNOSIS:  1. 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 She has no adenopathy but she has splenomegaly and bone marrow involvement.       2.   Iron deficiency anemia from history of menometrorrhagia.   PAST THERAPY/ CURRENT THERAPY:Observation for lymphoma and oral iron replacement for iron deficiency anemia.   INTERVAL HISTORY:  Desiree [name] 47 y.o. female returns for regular follow up visit. She reported that she intentionally  lost couple of pounds. Sometimes she has palpitations. She denied other types of infection such as pneumonia, UTI, diarrhea.  She has good appetite.  The patient denied fever, chills, night sweats.. She denied headaches, double vision, blurry vision, nasal congestion, nasal discharge, hearing problems, odynophagia or dysphagia. No chest pain, dyspnea, cough, abdominal pain, nausea, vomiting, diarrhea, constipation, hematochezia. The patient denied dysuria, nocturia, polyuria, hematuria, myalgia, numbness, tingling, psychiatric problems.  Review of Systems  Constitutional: Positive for weight loss. Negative for fever, chills, malaise/fatigue and diaphoresis.  HENT: Negative for hearing loss, ear pain, nosebleeds, congestion, sore throat, neck pain, tinnitus and ear discharge.   Eyes: Negative for blurred vision, double vision, photophobia and pain.  Respiratory: Negative for cough, hemoptysis, sputum production, shortness of breath and wheezing.   Cardiovascular: Positive for palpitations. Negative for chest pain, orthopnea, claudication, leg swelling and PND.  Gastrointestinal: Negative for heartburn, nausea, vomiting, abdominal pain, diarrhea, constipation, blood in  stool and melena.  Genitourinary: Negative for dysuria, urgency, hematuria and flank pain.  Musculoskeletal: Negative for myalgias, back pain, joint pain and falls.  Skin: Negative for itching and rash.  Neurological: Negative for dizziness, tingling, tremors, speech change, focal weakness, seizures, loss of consciousness, weakness and headaches.  Endo/Heme/Allergies: Does not bruise/bleed easily.  Psychiatric/Behavioral: Negative.     PAST MEDICAL HISTORY: Past Medical History  Diagnosis Date  . Leukocytosis 07/27/2011  . 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 DM 08/20/2007  . Hypertension   . Non Hodgkin's lymphoma   . Iron deficiency anemia due to chronic blood loss     PAST SURGICAL HISTORY: No past surgical history on file.  FAMILY HISTORY No family history on file.   HEALTH MAINTENANCE: History  Substance Use Topics  . Smoking status: Former Games developer  . Smokeless tobacco: Not on file  . Alcohol Use: No   Allergies  Allergen Reactions  . Erythromycin   . Tolnaftate Hives    Anti fungals Pills or cream breaks out in hives    Current Outpatient Prescriptions  Medication Sig Dispense Refill  . ferrous sulfate 325 (65 FE) MG tablet Take 325 mg by mouth 2 (two) times daily.      . fish oil-omega-3 fatty acids 1000 MG capsule Take 1 g by mouth daily.        Marland Kitchen ibuprofen (ADVIL,MOTRIN) 200 MG tablet Take 200 mg by mouth every 6 (six) hours as needed.        . metFORMIN (GLUCOPHAGE) 1000 MG tablet Take 1,000 mg by mouth 2 (two) times daily with a meal.        . pravastatin (PRAVACHOL) 20 MG tablet Take 20 mg by mouth daily.        . vitamin C (ASCORBIC ACID) 500 MG tablet  Take 500 mg by mouth 2 (two) times daily.       No current facility-administered medications for this visit.    OBJECTIVE: Filed Vitals:   05/19/13 0816  BP: 110/77  Pulse: 99  Temp: 97 F (36.1 C)  Resp: 18     Body mass index is 38.6 kg/(m^2).    ECOG FS:  HEENT:  Sclerae anicteric.  Conjunctivae were pink. Pupils round and reactive bilaterally. Oral mucosa is moist without ulceration or thrush. No occipital, submandibular, cervical, supraclavicular or axillar adenopathy. Lungs: clear to auscultation without wheezes. No rales or rhonchi. Heart: regular rate and rhythm. No murmur, gallop or rubs. Abdomen: soft, non tender. No guarding or rebound tenderness. Bowel sounds are present. No palpable hepatosplenomegaly. MSK: no focal spinal tenderness. Extremities: No clubbing or cyanosis.No calf tenderness to palpitation, no peripheral edema. The patient had grossly intact strength in upper and lower extremities Neuro: non-focal, alert and oriented to time, person and place, appropriate affect  LAB RESULTS:  CMP     Component Value Date/Time   NA 141 11/19/2012 0837   NA 140 03/26/2012 0902   K 4.3 11/19/2012 0837   K 4.1 03/26/2012 0902   CL 106 11/19/2012 0837   CL 107 03/26/2012 0902   CO2 25 11/19/2012 0837   CO2 26 03/26/2012 0902   GLUCOSE 107* 11/19/2012 0837   GLUCOSE 105* 03/26/2012 0902   BUN 12.2 11/19/2012 0837   BUN 10 03/26/2012 0902   CREATININE 0.7 11/19/2012 0837   CREATININE 0.63 03/26/2012 0902   CALCIUM 9.3 11/19/2012 0837   CALCIUM 8.8 03/26/2012 0902   PROT 7.0 11/19/2012 0837   PROT 5.9* 03/26/2012 0902   ALBUMIN 3.5 11/19/2012 0837   ALBUMIN 3.8 03/26/2012 0902   AST 13 11/19/2012 0837   AST 15 03/26/2012 0902   ALT 12 11/19/2012 0837   ALT 10 03/26/2012 0902   ALKPHOS 101 11/19/2012 0837   ALKPHOS 84 03/26/2012 0902   BILITOT 0.48 11/19/2012 0837   BILITOT 0.5 03/26/2012 0902   GFRNONAA >60 08/16/2007 0425   GFRAA  Value: >60        The eGFR has been calculated using the MDRD equation. This calculation has not been validated in all clinical 08/16/2007 0425   Lab Results  Component Value Date   WBC 69.3* 05/19/2013   NEUTROABS 5.6 05/19/2013   HGB 8.7* 05/19/2013   HCT 28.7* 05/19/2013   MCV 71.2* 05/19/2013   PLT 146 05/19/2013      Chemistry      Component  Value Date/Time   NA 141 11/19/2012 0837   NA 140 03/26/2012 0902   K 4.3 11/19/2012 0837   K 4.1 03/26/2012 0902   CL 106 11/19/2012 0837   CL 107 03/26/2012 0902   CO2 25 11/19/2012 0837   CO2 26 03/26/2012 0902   BUN 12.2 11/19/2012 0837   BUN 10 03/26/2012 0902   CREATININE 0.7 11/19/2012 0837   CREATININE 0.63 03/26/2012 0902      Component Value Date/Time   CALCIUM 9.3 11/19/2012 0837   CALCIUM 8.8 03/26/2012 0902   ALKPHOS 101 11/19/2012 0837   ALKPHOS 84 03/26/2012 0902   AST 13 11/19/2012 0837   AST 15 03/26/2012 0902   ALT 12 11/19/2012 0837   ALT 10 03/26/2012 0902   BILITOT 0.48 11/19/2012 0837   BILITOT 0.5 03/26/2012 0902     Urinalysis    Component Value Date/Time   COLORURINE YELLOW 08/14/2007 1325   APPEARANCEUR  TURBID* 08/14/2007 1325   LABSPEC 1.041* 08/14/2007 1325   PHURINE 6.0 08/14/2007 1325   GLUCOSEU >1000* 08/14/2007 1325   HGBUR NEGATIVE 08/14/2007 1325   BILIRUBINUR SMALL* 08/14/2007 1325   KETONESUR >80* 08/14/2007 1325   PROTEINUR 30* 08/14/2007 1325   UROBILINOGEN 1.0 08/14/2007 1325   NITRITE NEGATIVE 08/14/2007 1325   LEUKOCYTESUR NEGATIVE 08/14/2007 1325    STUDIES: [facility]m Digital Diag [facility] L  05/04/2013   *RADIOLOGY REPORT*  Clinical Data:  Possible mass left breast identified on one-view of the recent screening mammogram.  DIGITAL DIAGNOSTIC LEFT MAMMOGRAM WITH CAD  Comparison: 04/21/2013  Findings:  ACR Breast Density Category b:  There are scattered areas of fibroglandular density.  A focal spot compression view of the subareolar left breast in the MLO projection and a 90 degrees lateral of the view of the left breast shows no evidence of persistent mass or distortion.  Findings on recent screening mammogram of most consistent with overlap of normal fibroglandular breast tissue.  Mammographic images were processed with CAD.  IMPRESSION: No evidence of malignancy in the left breast.  RECOMMENDATION: Bilateral screening mammogram in 1 year.  I have discussed the findings and  recommendations with the patient. Results were also provided in writing at the conclusion of the visit.  If applicable, a reminder letter will be sent to the patient regarding her next appointment.  BI-RADS CATEGORY 1:  Negative.   Original Report Authenticated By: Britta Mccreedy, M.D.   Mm Digital Screening  04/21/2013   *RADIOLOGY REPORT*  Clinical Data: Screening.  DIGITAL SCREENING BILATERAL MAMMOGRAM WITH CAD  Comparison:  Previous exam(s).  FINDINGS:  ACR Breast Density Category b:  There are scattered areas of fibroglandular density.  In the left breast, a possible mass warrants further evaluation with spot compression views and possibly ultrasound.  In the right breast, no masses or malignant type calcifications are identified.  Images were processed with CAD.  IMPRESSION: Further evaluation is suggested for possible mass in the left breast.  RECOMMENDATION: Diagnostic mammogram and possibly ultrasound of the left breast. (Code:FI-L-10M)  The patient will be contacted regarding the findings, and additional imaging will be scheduled.  BI-RADS CATEGORY 0:  Incomplete.  Need additional imaging evaluation and/or prior mammograms for comparison.   Original Report Authenticated By: Elberta Fortis, M.D.   ASSESSMENT AND PLAN:   1.     History of non-Hodgkin lymphoma.  Stable.  We will continue watchful observation. No active indication for chemo yet (severe constitutional symptoms, recurrent severe infections, severe enlargement of lymph nodes, or severe decrease in red blood cells or platelet). I will follow for CBC in 2 weeks. I will check for hepatitis C.  Desiree [name] is OK with watchful observation for now.  2.      Iron deficiency anemia from menometrorrhagia: We will repeat iron study today. CBC in 2 weeks. Continue oral iron. Can increase to 3 pills. 3.      Diabetes mellitus, type II. She is on metformin 1000 mg p.o. b.i.d. per PCP.  4.     Hyperlipidemia: pravastatin 20 mg p.o. daily per PCP.     Follow up: CBC monthly. Follow up in about 6 months.       The length of time of the face-to-face encounter was 25 minutes. More than 50% of time was spent counseling and coordination of care.   Myra Rude, MD   05/19/2013 9:37 AM

## 2013-08-18 ENCOUNTER — Other Ambulatory Visit: Payer: Self-pay

## 2013-11-10 ENCOUNTER — Other Ambulatory Visit (HOSPITAL_COMMUNITY)
Admission: RE | Admit: 2013-11-10 | Discharge: 2013-11-10 | Disposition: A | Discharge status: Home / Self Care | Payer: BC Managed Care – PPO | Source: Ambulatory Visit | Attending: Family Medicine | Admitting: Family Medicine

## 2013-11-10 ENCOUNTER — Other Ambulatory Visit: Payer: Self-pay | Admitting: Family Medicine

## 2013-11-10 DIAGNOSIS — Z124 Encounter for screening for malignant neoplasm of cervix: Secondary | ICD-10-CM | POA: Insufficient documentation

## 2013-11-17 ENCOUNTER — Ambulatory Visit (HOSPITAL_BASED_OUTPATIENT_CLINIC_OR_DEPARTMENT_OTHER): Payer: BC Managed Care – PPO | Admitting: Hematology and Oncology

## 2013-11-17 ENCOUNTER — Encounter: Payer: Self-pay | Admitting: Hematology and Oncology

## 2013-11-17 ENCOUNTER — Ambulatory Visit: Payer: BC Managed Care – PPO

## 2013-11-17 ENCOUNTER — Other Ambulatory Visit (HOSPITAL_BASED_OUTPATIENT_CLINIC_OR_DEPARTMENT_OTHER): Payer: BC Managed Care – PPO

## 2013-11-17 VITALS — BP 129/79 | HR 100 | Temp 96.9°F | Resp 18 | Ht 67.5 in | Wt 273.0 lb

## 2013-11-17 DIAGNOSIS — R5383 Other fatigue: Secondary | ICD-10-CM

## 2013-11-17 DIAGNOSIS — N920 Excessive and frequent menstruation with regular cycle: Secondary | ICD-10-CM

## 2013-11-17 DIAGNOSIS — R599 Enlarged lymph nodes, unspecified: Secondary | ICD-10-CM

## 2013-11-17 DIAGNOSIS — D500 Iron deficiency anemia secondary to blood loss (chronic): Secondary | ICD-10-CM

## 2013-11-17 DIAGNOSIS — R5381 Other malaise: Secondary | ICD-10-CM

## 2013-11-17 DIAGNOSIS — C8307 Small cell B-cell lymphoma, spleen: Secondary | ICD-10-CM

## 2013-11-17 DIAGNOSIS — L732 Hidradenitis suppurativa: Secondary | ICD-10-CM

## 2013-11-17 HISTORY — DX: Other fatigue: R53.83

## 2013-11-17 LAB — CBC & DIFF AND RETIC
BASO%: 0.2 % (ref 0.0–2.0)
Basophils Absolute: 0.2 10*3/uL — ABNORMAL HIGH (ref 0.0–0.1)
EOS%: 0.3 % (ref 0.0–7.0)
Eosinophils Absolute: 0.2 10*3/uL (ref 0.0–0.5)
HCT: 30.3 % — ABNORMAL LOW (ref 34.8–46.6)
HGB: 9 g/dL — ABNORMAL LOW (ref 11.6–15.9)
Immature Retic Fract: 33.7 % — ABNORMAL HIGH (ref 1.60–10.00)
LYMPH%: 88.5 % — ABNORMAL HIGH (ref 14.0–49.7)
MCH: 20.3 pg — AB (ref 25.1–34.0)
MCHC: 29.6 g/dL — ABNORMAL LOW (ref 31.5–36.0)
MCV: 68.7 fL — AB (ref 79.5–101.0)
MONO#: 1.4 10*3/uL — ABNORMAL HIGH (ref 0.1–0.9)
MONO%: 1.7 % (ref 0.0–14.0)
NEUT#: 7.6 10*3/uL — ABNORMAL HIGH (ref 1.5–6.5)
NEUT%: 9.3 % — ABNORMAL LOW (ref 38.4–76.8)
NRBC: 0 % (ref 0–0)
Platelets: 166 10*3/uL (ref 145–400)
RBC: 4.41 10*6/uL (ref 3.70–5.45)
RDW: 20.1 % — AB (ref 11.2–14.5)
RETIC %: 2.74 % — AB (ref 0.70–2.10)
Retic Ct Abs: 120.83 10*3/uL — ABNORMAL HIGH (ref 33.70–90.70)
WBC: 81.5 10*3/uL (ref 3.9–10.3)
lymph#: 72.1 10*3/uL — ABNORMAL HIGH (ref 0.9–3.3)

## 2013-11-17 LAB — IRON AND TIBC CHCC
%SAT: 8 % — ABNORMAL LOW (ref 21–57)
IRON: 22 ug/dL — AB (ref 41–142)
TIBC: 285 ug/dL (ref 236–444)
UIBC: 263 ug/dL (ref 120–384)

## 2013-11-17 LAB — COMPREHENSIVE METABOLIC PANEL (CC13)
ALK PHOS: 121 U/L (ref 40–150)
ALT: 15 U/L (ref 0–55)
AST: 15 U/L (ref 5–34)
Albumin: 3.6 g/dL (ref 3.5–5.0)
Anion Gap: 8 mEq/L (ref 3–11)
BUN: 11.1 mg/dL (ref 7.0–26.0)
CO2: 28 mEq/L (ref 22–29)
CREATININE: 0.8 mg/dL (ref 0.6–1.1)
Calcium: 9.6 mg/dL (ref 8.4–10.4)
Chloride: 107 mEq/L (ref 98–109)
Glucose: 171 mg/dl — ABNORMAL HIGH (ref 70–140)
Potassium: 4.3 mEq/L (ref 3.5–5.1)
Sodium: 143 mEq/L (ref 136–145)
Total Bilirubin: 0.55 mg/dL (ref 0.20–1.20)
Total Protein: 6.8 g/dL (ref 6.4–8.3)

## 2013-11-17 LAB — FERRITIN CHCC: FERRITIN: 60 ng/mL (ref 9–269)

## 2013-11-17 LAB — TECHNOLOGIST REVIEW

## 2013-11-17 LAB — LACTATE DEHYDROGENASE (CC13): LDH: 355 U/L — AB (ref 125–245)

## 2013-11-17 LAB — TSH CHCC: TSH: 2.514 m[IU]/L (ref 0.308–3.960)

## 2013-11-17 NOTE — Progress Notes (Signed)
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 He was initially discovered that she had abnormal lymphocytosis. In April 2010, bone marrow aspirate and biopsy showed involvement. Flow cytometry demonstrated monoclonal B-cell expressing CD20, CD19, CD20 1, CD22 but negative for CD10 and CD23. CT scan of the chest abdomen and pelvis confirmed borderline liver enlargement, definitive splenomegaly and small lymphadenopathy. She was being observed. She was also found to have iron deficiency anemia due to heavy menstruation. She had received iron infusion in the past and currently on oral ion supplements  INTERVAL HISTORY: Desiree [name] 48 y.o. female returns for further followup. She have recurrent infection in her axillary region, likely represented chronic hydradenitis. Her last antibiotic therapy was in December of 2014. She denies any abnormal weight loss. She is to have heavy menstruation. She take iron supplement twice a day with food Denies any pica.The patient denies any recent signs or symptoms of bleeding such as spontaneous epistaxis, hematuria or hematochezia. She denies any recent fever, chills, night sweats or abnormal weight loss. No new adenopathy    I have reviewed the past medical history, past surgical history, social history and family history with the patient and they are unchanged from previous note.  ALLERGIES:  is allergic to erythromycin and tolnaftate.  MEDICATIONS:  Current Outpatient Prescriptions  Medication Sig Dispense Refill  . ferrous sulfate 325 (65 FE) MG tablet Take 325 mg by mouth 2 (two) times daily.      .  fish oil-omega-3 fatty acids 1000 MG capsule Take 1 g by mouth daily.        Marland Kitchen ibuprofen (ADVIL,MOTRIN) 200 MG tablet Take 200 mg by mouth every 6 (six) hours as needed.        . lovastatin (MEVACOR) 20 MG tablet Take 20 mg by mouth at bedtime.      . metFORMIN (GLUCOPHAGE) 1000 MG tablet Take 1,000 mg by mouth 2 (two) times daily with a meal.        . vitamin C (ASCORBIC ACID) 500 MG tablet Take 500 mg by mouth 2 (two) times daily.       No current facility-administered medications for this visit.    REVIEW OF SYSTEMS:   Constitutional: Denies fevers, chills or abnormal weight loss Eyes: Denies blurriness of vision Ears, nose, mouth, throat, and face: Denies mucositis or sore throat Respiratory: Denies cough, dyspnea or wheezes Cardiovascular: Denies palpitation, chest discomfort or lower extremity swelling Gastrointestinal:  Denies nausea, heartburn or change in bowel habits Skin: Denies abnormal skin rashes Lymphatics: Denies new lymphadenopathy or easy bruising Neurological:Denies numbness, tingling or new weaknesses Behavioral/Psych: Mood is stable, no new changes  All other systems were reviewed with the patient and are negative.  PHYSICAL EXAMINATION: ECOG PERFORMANCE STATUS: 0 - Asymptomatic  Filed Vitals:   11/17/13 0809  BP: 129/79  Pulse: 100  Temp: 96.9 F (36.1 C)  Resp: 18   Filed Weights   11/17/13 0809  Weight: 273 lb (123.832 kg)    GENERAL:alert, no distress and comfortable. She is morbidly obese SKIN: skin color, texture, turgor are normal, no rashes or significant lesions EYES: normal, Conjunctiva are pink and non-injected, sclera clear OROPHARYNX:no exudate, no erythema and  lips, buccal mucosa, and tongue normal  NECK: supple, thyroid normal size, non-tender, without nodularity LYMPH:  no palpable lymphadenopathy in the cervical, axillary or inguinal LUNGS: clear to auscultation and percussion with normal breathing effort HEART: regular rate & rhythm  and no murmurs and no lower extremity edema ABDOMEN:abdomen soft, non-tender and normal bowel sounds. Palpable splenomegaly Musculoskeletal:no cyanosis of digits and no clubbing  NEURO: alert & oriented x 3 with fluent speech, no focal motor/sensory deficits  LABORATORY DATA:  I have reviewed the data as listed    Component Value Date/Time   NA 141 11/19/2012 0837   NA 140 03/26/2012 0902   K 4.3 11/19/2012 0837   K 4.1 03/26/2012 0902   CL 106 11/19/2012 0837   CL 107 03/26/2012 0902   CO2 25 11/19/2012 0837   CO2 26 03/26/2012 0902   GLUCOSE 107* 11/19/2012 0837   GLUCOSE 105* 03/26/2012 0902   BUN 12.2 11/19/2012 0837   BUN 10 03/26/2012 0902   CREATININE 0.7 11/19/2012 0837   CREATININE 0.63 03/26/2012 0902   CALCIUM 9.3 11/19/2012 0837   CALCIUM 8.8 03/26/2012 0902   PROT 7.0 11/19/2012 0837   PROT 5.9* 03/26/2012 0902   ALBUMIN 3.5 11/19/2012 0837   ALBUMIN 3.8 03/26/2012 0902   AST 13 11/19/2012 0837   AST 15 03/26/2012 0902   ALT 12 11/19/2012 0837   ALT 10 03/26/2012 0902   ALKPHOS 101 11/19/2012 0837   ALKPHOS 84 03/26/2012 0902   BILITOT 0.48 11/19/2012 0837   BILITOT 0.5 03/26/2012 0902   GFRNONAA >60 08/16/2007 0425   GFRAA  Value: >60        The eGFR has been calculated using the MDRD equation. This calculation has not been validated in all clinical 08/16/2007 0425    No results found for this basename: SPEP, UPEP,  kappa and lambda light chains    Lab Results  Component Value Date   WBC 81.5* 11/17/2013   NEUTROABS 7.6* 11/17/2013   HGB 9.0* 11/17/2013   HCT 30.3* 11/17/2013   MCV 68.7* 11/17/2013   PLT 166 11/17/2013      Chemistry      Component Value Date/Time   NA 141 11/19/2012 0837   NA 140 03/26/2012 0902   K 4.3 11/19/2012 0837   K 4.1 03/26/2012 0902   CL 106 11/19/2012 0837   CL 107 03/26/2012 0902   CO2 25 11/19/2012 0837   CO2 26 03/26/2012 0902   BUN 12.2 11/19/2012 0837   BUN 10 03/26/2012 0902   CREATININE 0.7 11/19/2012 0837   CREATININE 0.63 03/26/2012 0902      Component Value Date/Time    CALCIUM 9.3 11/19/2012 0837   CALCIUM 8.8 03/26/2012 0902   ALKPHOS 101 11/19/2012 0837   ALKPHOS 84 03/26/2012 0902   AST 13 11/19/2012 0837   AST 15 03/26/2012 0902   ALT 12 11/19/2012 0837   ALT 10 03/26/2012 0902   BILITOT 0.48 11/19/2012 0837   BILITOT 0.5 03/26/2012 0902     ASSESSMENT & PLAN:  #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 I recommend continue observation #2 microcytic anemia #3 history of iron deficiency I recommend the patient to take iron supplement before meals. I will recheck her iron studies. I will also check for hemoglobinopathy evaluation to rule out thalassemia and we'll call the patient with results. #4 chronic hydradenitis Clinical examination showed complete resolution. If it recurs again,  the patient will benefit from incisional and drainage  I will check her immunoglobulin levels #5 preventive care I recommend influenza vaccination but the patient declined The patient is up-to-date with age appropriate screening. Her last mammogram was negative in July of 2014 #6 fatigue I will check her thyroid function test Orders Placed This Encounter  Procedures  . Comprehensive metabolic panel    Standing Status: Future     Number of Occurrences: 1     Standing Expiration Date: 11/17/2014  . Lactate dehydrogenase    Standing Status: Future     Number of Occurrences: 1     Standing Expiration Date: 11/17/2014  . Ferritin    Standing Status: Future     Number of Occurrences: 1     Standing Expiration Date: 11/17/2014  . Hemoglobinopathy evaluation    Standing Status: Future     Number of Occurrences: 1     Standing Expiration Date: 11/17/2014  . Iron and TIBC    Standing Status: Future     Number of Occurrences: 1     Standing Expiration Date: 11/17/2014  . Sedimentation rate    Standing Status: Future     Number of Occurrences: 1     Standing Expiration Date: 11/17/2014  .  IgG, IgA, IgM    Standing Status: Future     Number of Occurrences: 1     Standing Expiration Date: 11/17/2014  . T4, free    Standing Status: Future     Number of Occurrences: 1     Standing Expiration Date: 11/17/2014  . TSH    Standing Status: Future     Number of Occurrences: 1     Standing Expiration Date: 11/17/2014  . CBC & Diff and Retic    Standing Status: Future     Number of Occurrences: 1     Standing Expiration Date: 11/17/2014   All questions were answered. The patient knows to call the clinic with any problems, questions or concerns. No barriers to learning was detected. I spent 25 minutes counseling the patient face to face. The total time spent in the appointment was 40 minutes and more than 50% was on counseling and review of test results     [facility], [place], MD 11/17/2013 9:19 AM

## 2013-11-18 ENCOUNTER — Telehealth: Payer: Self-pay | Admitting: Hematology and Oncology

## 2013-11-18 NOTE — Telephone Encounter (Signed)
s.w. pt and advised on OCT appt....pt ok adn aware °

## 2013-11-21 LAB — HEMOGLOBINOPATHY EVALUATION
HGB F QUANT: 0 % (ref 0.0–2.0)
Hemoglobin Other: 0 %
Hgb A2 Quant: 2.4 % (ref 2.2–3.2)
Hgb A: 97.6 % (ref 96.8–97.8)
Hgb S Quant: 0 %

## 2013-11-21 LAB — IGG, IGA, IGM
IGM, SERUM: 283 mg/dL (ref 52–322)
IgA: 75 mg/dL (ref 69–380)
IgG (Immunoglobin G), Serum: 709 mg/dL (ref 690–1700)

## 2013-11-21 LAB — T4, FREE: Free T4: 1.21 ng/dL (ref 0.80–1.80)

## 2013-11-21 LAB — SEDIMENTATION RATE: SED RATE: 92 mm/h — AB (ref 0–22)

## 2014-01-12 ENCOUNTER — Emergency Department (HOSPITAL_COMMUNITY)
Admission: EM | Admit: 2014-01-12 | Discharge: 2014-01-12 | Disposition: A | Discharge status: Home / Self Care | Payer: BC Managed Care – PPO | Source: Home / Self Care | Attending: Family Medicine | Admitting: Family Medicine

## 2014-01-12 ENCOUNTER — Encounter (HOSPITAL_COMMUNITY): Payer: Self-pay | Admitting: Emergency Medicine

## 2014-01-12 DIAGNOSIS — R600 Localized edema: Secondary | ICD-10-CM

## 2014-01-12 DIAGNOSIS — J302 Other seasonal allergic rhinitis: Secondary | ICD-10-CM

## 2014-01-12 DIAGNOSIS — J309 Allergic rhinitis, unspecified: Secondary | ICD-10-CM

## 2014-01-12 DIAGNOSIS — R609 Edema, unspecified: Secondary | ICD-10-CM

## 2014-01-12 MED ORDER — FLUTICASONE PROPIONATE 50 MCG/ACT NA SUSP: 1.0000 | Freq: Two times a day (BID) | NASAL | Status: DC

## 2014-01-12 MED ORDER — FEXOFENADINE HCL 180 MG PO TABS: 180.0000 mg | ORAL_TABLET | Freq: Every day | ORAL | Status: DC

## 2014-01-12 MED ORDER — FUROSEMIDE 40 MG PO TABS: 40.0000 mg | ORAL_TABLET | Freq: Every day | ORAL | Status: DC

## 2014-01-12 NOTE — Discharge Instructions (Signed)
Drink plenty of water, use medicine as prescribed and see your doctor if further problems.

## 2014-01-12 NOTE — ED Provider Notes (Signed)
CSN: [id number]     Arrival date & time 01/12/14  1821 History   First MD Initiated Contact with Patient 01/12/14 1934     Chief Complaint  Patient presents with  . URI   (Consider location/radiation/quality/duration/timing/severity/associated sxs/prior Treatment) Patient is a 48 y.o. female presenting with URI.  URI Presenting symptoms: congestion, cough and rhinorrhea   Presenting symptoms: no fever and no sore throat   Severity:  Mild Onset quality:  Gradual Duration:  3 days Progression:  Unchanged Chronicity:  New Ineffective treatments:  Prescription medications (took clindamycin but not improved.) Associated symptoms: no sneezing   Risk factors: immunosuppression     Past Medical History  Diagnosis Date  . Leukocytosis 07/27/2011  . 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 DM 08/20/2007  . Hypertension   . Non Hodgkin's lymphoma   . Iron deficiency anemia due to chronic blood loss   . Fatigue 11/17/2013   History reviewed. No pertinent past surgical history. Family History  Problem Relation Age of Onset  . Cancer Maternal Aunt     breast ca  . Cancer Maternal Uncle     lung ca   History  Substance Use Topics  . Smoking status: Former Research scientist (life sciences)  . Smokeless tobacco: Never Used  . Alcohol Use: No   OB History   Grav Para Term Preterm Abortions TAB SAB Ect Mult Living                 Review of Systems  Constitutional: Negative.  Negative for fever.  HENT: Positive for congestion, postnasal drip and rhinorrhea. Negative for sneezing and sore throat.   Respiratory: Positive for cough. Negative for shortness of breath.   Cardiovascular: Positive for leg swelling.    Allergies  Erythromycin and Tolnaftate  Home Medications   Current Outpatient Rx  Name  Route  Sig  Dispense  Refill  . ferrous sulfate 325 (65 FE) MG tablet   Oral   Take 325 mg by mouth 2 (two) times daily.         . fexofenadine (ALLEGRA) 180 MG tablet   Oral   Take 1 tablet (180 mg total) by mouth daily.   30 tablet   1   . fish oil-omega-3 fatty acids 1000 MG capsule   Oral   Take 1 g by mouth daily.           . fluticasone (FLONASE) 50 MCG/ACT nasal spray   Each Nare   Place 1 spray into both nostrils 2 (two) times daily.   1 g   2   . furosemide (LASIX) 40 MG tablet   Oral   Take 1 tablet (40 mg total) by mouth daily. For leg swelling.   30 tablet   0   . ibuprofen (ADVIL,MOTRIN) 200 MG tablet   Oral   Take 200 mg by mouth every 6 (six) hours as needed.           . lovastatin (MEVACOR) 20 MG tablet   Oral   Take 20 mg by mouth at bedtime.         . metFORMIN (GLUCOPHAGE) 1000 MG tablet   Oral   Take 1,000 mg by mouth 2 (two) times daily with a meal.           . vitamin C (ASCORBIC ACID) 500 MG tablet   Oral   Take 500 mg by mouth 2 (two) times daily.  BP 140/81  Pulse 111  Temp(Src) 98.3 F (36.8 C) (Oral)  Resp 20  SpO2 96%  LMP 11/14/2011 Physical Exam  Nursing note and vitals reviewed. Constitutional: She is oriented to person, place, and time. She appears well-developed and well-nourished. No distress.  HENT:  Head: Normocephalic.  Right Ear: External ear normal.  Left Ear: External ear normal.  Nose: Mucosal edema and rhinorrhea present.  Mouth/Throat: Oropharynx is clear and moist.  Neck: Normal range of motion. Neck supple.  Cardiovascular: Normal heart sounds and intact distal pulses.   Pulmonary/Chest: Breath sounds normal. She has no wheezes. She has no rales.  Musculoskeletal: She exhibits edema.  Neurological: She is alert and oriented to person, place, and time.  Skin: Skin is warm and dry.    ED Course  Procedures (including critical care time) Labs Review Labs Reviewed - No data to display Imaging Review No results found.   MDM   1. Seasonal allergic rhinitis   2. Edema of both legs        Billy Fischer, MD 01/12/14 2050

## 2014-01-12 NOTE — ED Notes (Signed)
Patient reports throat swelling "like with a cold", sob with walking, wheezing, sleeping propped up at night, ankle swelling, onset Monday of symptoms.  Patient reports having left over clindamycin from another illness and started taking this medicine on Monday, Tuesday and Wednesday.  None today.  Patient has a random pill with here, did not bring pill bottle and does not know the milligrams of medicine.  .  Patient concerned for pneumonia

## 2014-05-25 ENCOUNTER — Telehealth: Payer: Self-pay | Admitting: *Deleted

## 2014-05-25 DIAGNOSIS — C8307 Small cell B-cell lymphoma, spleen: Secondary | ICD-10-CM

## 2014-05-25 NOTE — Telephone Encounter (Signed)
Pt confirmed appt for tomorrow at 1:30 pm for lab and then possible transfusion if her Hgb is less than 8.0.  She is aware she will also be scheduled for lab/Dr. Alvy Bimler on 8/19. It has not been scheduled yet, but order has been sent.

## 2014-05-25 NOTE — Telephone Encounter (Signed)
Per [facility] staff phone call scheduled appts. Advised schedulers 

## 2014-05-25 NOTE — Telephone Encounter (Signed)
Pt reports feeling really really tired with heart racing. States her Hgb at PCP was 7.9 a few weeks ago.  She asks if she needs a transfusion?  She also reports some fluid retention in feet and abd.  Was started on Lasix by Urgent Care and this has helped but she wondering about the cause of the retention?

## 2014-05-25 NOTE — Telephone Encounter (Signed)
Dr. Alvy Bimler notified and she ordered lab and transfusion for hgb less than 8.0 today or tomorrow.  Office visit Wed 8/19 w/ lab.   Called pt and left VM for her to return call for appts made.

## 2014-05-26 ENCOUNTER — Other Ambulatory Visit (HOSPITAL_BASED_OUTPATIENT_CLINIC_OR_DEPARTMENT_OTHER): Payer: BC Managed Care – PPO

## 2014-05-26 ENCOUNTER — Ambulatory Visit: Payer: BC Managed Care – PPO

## 2014-05-26 ENCOUNTER — Ambulatory Visit (HOSPITAL_COMMUNITY)
Admission: RE | Admit: 2014-05-26 | Discharge: 2014-05-26 | Disposition: A | Discharge status: Home / Self Care | Payer: BC Managed Care – PPO | Source: Ambulatory Visit | Attending: Hematology and Oncology | Admitting: Hematology and Oncology

## 2014-05-26 ENCOUNTER — Other Ambulatory Visit: Payer: Self-pay | Admitting: Hematology and Oncology

## 2014-05-26 ENCOUNTER — Other Ambulatory Visit: Payer: Self-pay | Admitting: *Deleted

## 2014-05-26 ENCOUNTER — Encounter: Payer: Self-pay | Admitting: Hematology and Oncology

## 2014-05-26 VITALS — BP 116/64 | HR 97 | Temp 98.7°F | Resp 20

## 2014-05-26 DIAGNOSIS — D630 Anemia in neoplastic disease: Secondary | ICD-10-CM

## 2014-05-26 DIAGNOSIS — D509 Iron deficiency anemia, unspecified: Secondary | ICD-10-CM

## 2014-05-26 DIAGNOSIS — C8307 Small cell B-cell lymphoma, spleen: Secondary | ICD-10-CM

## 2014-05-26 HISTORY — DX: Anemia in neoplastic disease: D63.0

## 2014-05-26 LAB — CBC WITH DIFFERENTIAL/PLATELET
BASO%: 0.2 % (ref 0.0–2.0)
BASOS ABS: 0.2 10*3/uL — AB (ref 0.0–0.1)
EOS%: 0.1 % (ref 0.0–7.0)
Eosinophils Absolute: 0.1 10*3/uL (ref 0.0–0.5)
HCT: 25.2 % — ABNORMAL LOW (ref 34.8–46.6)
HEMOGLOBIN: 7 g/dL — AB (ref 11.6–15.9)
LYMPH%: 90.4 % — AB (ref 14.0–49.7)
MCH: 18.8 pg — AB (ref 25.1–34.0)
MCHC: 27.6 g/dL — ABNORMAL LOW (ref 31.5–36.0)
MCV: 68.1 fL — AB (ref 79.5–101.0)
MONO#: 0.4 10*3/uL (ref 0.1–0.9)
MONO%: 0.5 % (ref 0.0–14.0)
NEUT#: 7.2 10*3/uL — ABNORMAL HIGH (ref 1.5–6.5)
NEUT%: 8.8 % — ABNORMAL LOW (ref 38.4–76.8)
Platelets: 158 10*3/uL (ref 145–400)
RBC: 3.71 10*6/uL (ref 3.70–5.45)
RDW: 20.6 % — AB (ref 11.2–14.5)
WBC: 82.5 10*3/uL (ref 3.9–10.3)
lymph#: 74.5 10*3/uL — ABNORMAL HIGH (ref 0.9–3.3)

## 2014-05-26 LAB — ABO/RH: ABO/RH(D): B POS

## 2014-05-26 LAB — HOLD TUBE, BLOOD BANK

## 2014-05-26 LAB — TECHNOLOGIST REVIEW

## 2014-05-26 MED ORDER — SODIUM CHLORIDE 0.9 % IV SOLN: 250.0000 mL | Freq: Once | INTRAVENOUS | Status: DC

## 2014-05-26 NOTE — Patient Instructions (Signed)

## 2014-05-27 LAB — TYPE AND SCREEN
ABO/RH(D): B POS
ANTIBODY SCREEN: NEGATIVE
Unit division: 0

## 2014-05-31 ENCOUNTER — Other Ambulatory Visit (HOSPITAL_BASED_OUTPATIENT_CLINIC_OR_DEPARTMENT_OTHER): Payer: BC Managed Care – PPO

## 2014-05-31 ENCOUNTER — Encounter: Payer: Self-pay | Admitting: Hematology and Oncology

## 2014-05-31 ENCOUNTER — Other Ambulatory Visit: Payer: Self-pay

## 2014-05-31 ENCOUNTER — Telehealth: Payer: Self-pay | Admitting: *Deleted

## 2014-05-31 ENCOUNTER — Ambulatory Visit (HOSPITAL_BASED_OUTPATIENT_CLINIC_OR_DEPARTMENT_OTHER): Payer: BC Managed Care – PPO | Admitting: Hematology and Oncology

## 2014-05-31 ENCOUNTER — Other Ambulatory Visit: Payer: Self-pay | Admitting: Hematology and Oncology

## 2014-05-31 ENCOUNTER — Non-Acute Institutional Stay (HOSPITAL_COMMUNITY)
Admission: AD | Admit: 2014-05-31 | Discharge: 2014-05-31 | Disposition: A | Discharge status: Home / Self Care | Payer: BC Managed Care – PPO | Source: Ambulatory Visit | Attending: Hematology and Oncology | Admitting: Hematology and Oncology

## 2014-05-31 VITALS — BP 121/70 | HR 105 | Temp 98.8°F | Resp 18 | Ht 67.5 in | Wt 274.1 lb

## 2014-05-31 DIAGNOSIS — E119 Type 2 diabetes mellitus without complications: Secondary | ICD-10-CM | POA: Diagnosis not present

## 2014-05-31 DIAGNOSIS — I10 Essential (primary) hypertension: Secondary | ICD-10-CM | POA: Diagnosis not present

## 2014-05-31 DIAGNOSIS — C8307 Small cell B-cell lymphoma, spleen: Secondary | ICD-10-CM | POA: Diagnosis present

## 2014-05-31 DIAGNOSIS — D630 Anemia in neoplastic disease: Secondary | ICD-10-CM

## 2014-05-31 DIAGNOSIS — D72829 Elevated white blood cell count, unspecified: Secondary | ICD-10-CM | POA: Diagnosis not present

## 2014-05-31 DIAGNOSIS — D500 Iron deficiency anemia secondary to blood loss (chronic): Secondary | ICD-10-CM | POA: Diagnosis not present

## 2014-05-31 DIAGNOSIS — D509 Iron deficiency anemia, unspecified: Secondary | ICD-10-CM | POA: Diagnosis not present

## 2014-05-31 LAB — CBC & DIFF AND RETIC
BASO%: 0.4 % (ref 0.0–2.0)
Basophils Absolute: 0.3 10*3/uL — ABNORMAL HIGH (ref 0.0–0.1)
EOS ABS: 0.1 10*3/uL (ref 0.0–0.5)
EOS%: 0.1 % (ref 0.0–7.0)
HCT: 27.3 % — ABNORMAL LOW (ref 34.8–46.6)
HGB: 7.9 g/dL — ABNORMAL LOW (ref 11.6–15.9)
Immature Retic Fract: 29.2 % — ABNORMAL HIGH (ref 1.60–10.00)
LYMPH%: 89.4 % — ABNORMAL HIGH (ref 14.0–49.7)
MCH: 20.3 pg — ABNORMAL LOW (ref 25.1–34.0)
MCHC: 28.8 g/dL — ABNORMAL LOW (ref 31.5–36.0)
MCV: 70.2 fL — ABNORMAL LOW (ref 79.5–101.0)
MONO#: 0.8 10*3/uL (ref 0.1–0.9)
MONO%: 1.1 % (ref 0.0–14.0)
NEUT%: 9 % — ABNORMAL LOW (ref 38.4–76.8)
NEUTROS ABS: 6.2 10*3/uL (ref 1.5–6.5)
NRBC: 0 % (ref 0–0)
Platelets: 146 10*3/uL (ref 145–400)
RBC: 3.89 10*6/uL (ref 3.70–5.45)
RDW: 23 % — AB (ref 11.2–14.5)
RETIC %: 2.98 % — AB (ref 0.70–2.10)
Retic Ct Abs: 115.92 10*3/uL — ABNORMAL HIGH (ref 33.70–90.70)
WBC: 69.4 10*3/uL (ref 3.9–10.3)
lymph#: 62 10*3/uL — ABNORMAL HIGH (ref 0.9–3.3)

## 2014-05-31 LAB — HOLD TUBE, BLOOD BANK

## 2014-05-31 LAB — TECHNOLOGIST REVIEW

## 2014-05-31 LAB — LACTATE DEHYDROGENASE (CC13): LDH: 413 U/L — ABNORMAL HIGH (ref 125–245)

## 2014-05-31 MED ORDER — SODIUM CHLORIDE 0.9 % IV SOLN
250.0000 mL | Freq: Once | INTRAVENOUS | Status: AC
  Administered 2014-05-31: 250 mL via INTRAVENOUS

## 2014-05-31 MED ORDER — SODIUM CHLORIDE 0.9 % IJ SOLN: 10.0000 mL | INTRAMUSCULAR | Status: DC | PRN

## 2014-05-31 MED ORDER — SODIUM CHLORIDE 0.9 % IJ SOLN: 3.0000 mL | INTRAMUSCULAR | Status: DC | PRN

## 2014-05-31 MED ORDER — ACETAMINOPHEN 325 MG PO TABS
650.0000 mg | ORAL_TABLET | Freq: Once | ORAL | Status: AC
  Administered 2014-05-31: 650 mg via ORAL
  Filled 2014-05-31: qty 2

## 2014-05-31 MED ORDER — HEPARIN SOD (PORK) LOCK FLUSH 100 UNIT/ML IV SOLN: 500.0000 [IU] | Freq: Every day | INTRAVENOUS | Status: DC | PRN

## 2014-05-31 MED ORDER — HEPARIN SOD (PORK) LOCK FLUSH 100 UNIT/ML IV SOLN: 250.0000 [IU] | INTRAVENOUS | Status: DC | PRN

## 2014-05-31 NOTE — Assessment & Plan Note (Signed)
This is related to lymphoma process. Recommend observation only.

## 2014-05-31 NOTE — Assessment & Plan Note (Signed)
We discussed some of the risks, benefits, and alternatives of blood transfusions. The patient is symptomatic from anemia and the hemoglobin level is critically low.  Some of the side-effects to be expected including risks of transfusion reactions, chills, infection, syndrome of volume overload and risk of hospitalization from various reasons and the patient is willing to proceed and went ahead to sign consent today. She will receive 2 units of blood transfusion

## 2014-05-31 NOTE — Procedures (Signed)
[facility]  Procedure Note  Ardell Aaronson JQG:[id number] DOB: Nov 29, 1965 DOA: 05/31/2014   Ordering Physician: Natale Lay MD   Associated Diagnosis: Splenic marginal zone b-cell lymphoma (200.37)   Procedure Note: Consent Signed, Premeds Given, Transfused 2 units PRBC   Condition During Procedure: Tolerated well, No complaints, VSS   Condition at Discharge: Tolerated Well. No complaints. VSS. Patient discharged home. Patient in no apparent distress. Instructed patient to seek medical attention if any changes in condition, patient acknowledges. Patient left day hospital with belongings ambulatory escorted by family member.    Wendie Simmer, RN  [facility]

## 2014-05-31 NOTE — H&P (Signed)
She is here for transfusion only 

## 2014-05-31 NOTE — Progress Notes (Signed)
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 He was initially discovered that she had abnormal lymphocytosis. In April 2010, bone marrow aspirate and biopsy showed involvement. Flow cytometry demonstrated monoclonal B-cell expressing CD20, CD19, CD20 1, CD22 but negative for CD10 and CD23. CT scan of the chest abdomen and pelvis confirmed borderline liver enlargement, definitive splenomegaly and small lymphadenopathy. She was being observed. She was also found to have iron deficiency anemia due to heavy menstruation. She had received iron infusion in the past and currently on oral ion supplements Last week, she received blood transfusion due to the severe symptomatic anemia. INTERVAL HISTORY: Please see below for problem oriented charting. Apart from fatigue, she has no new complaints. She denies recent weight loss. Denies new palpable lymphadenopathy. No recent infection. REVIEW OF SYSTEMS:   Constitutional: Denies fevers, chills or abnormal weight loss Eyes: Denies blurriness of vision Ears, nose, mouth, throat, and face: Denies mucositis or sore throat Respiratory: Denies cough, dyspnea or wheezes Cardiovascular: Denies palpitation, chest discomfort or lower extremity swelling Gastrointestinal:  Denies nausea, heartburn or change in bowel habits Skin: Denies abnormal skin rashes Lymphatics: Denies new lymphadenopathy or easy bruising Neurological:Denies numbness, tingling or new weaknesses Behavioral/Psych: Mood is stable, no new changes  All other systems were reviewed with the patient and are negative.  I have reviewed the past medical history, past surgical history, social history and family history with the patient and  they are unchanged from previous note.  ALLERGIES:  is allergic to erythromycin and tolnaftate.  MEDICATIONS:  Current Outpatient Prescriptions  Medication Sig Dispense Refill  . benazepril (LOTENSIN) 10 MG tablet       . Blood Glucose Monitoring Suppl (ONE TOUCH ULTRA MINI) W/DEVICE KIT       . ferrous sulfate 325 (65 FE) MG tablet Take 325 mg by mouth 2 (two) times daily.      . fexofenadine (ALLEGRA) 180 MG tablet Take 1 tablet (180 mg total) by mouth daily.  30 tablet  1  . fish oil-omega-3 fatty acids 1000 MG capsule Take 1 g by mouth daily.        . fluticasone (FLONASE) 50 MCG/ACT nasal spray Place 1 spray into both nostrils 2 (two) times daily.  1 g  2  . furosemide (LASIX) 40 MG tablet Take 1 tablet (40 mg total) by mouth daily. For leg swelling.  30 tablet  0  . ibuprofen (ADVIL,MOTRIN) 200 MG tablet Take 200 mg by mouth every 6 (six) hours as needed.        . metFORMIN (GLUCOPHAGE) 1000 MG tablet Take 1,000 mg by mouth 2 (two) times daily with a meal.        . ONE TOUCH ULTRA TEST test strip       . valACYclovir (VALTREX) 500 MG tablet       . vitamin C (ASCORBIC ACID) 500 MG tablet Take 500 mg by mouth 2 (two) times daily.      Marland Kitchen lovastatin (MEVACOR) 20 MG tablet Take 20 mg by mouth at bedtime.       No current facility-administered medications for this visit.   Facility-Administered Medications Ordered in Other Visits  Medication Dose Route Frequency Provider Last Rate Last Dose  . heparin lock flush 100 unit/mL  500 Units Intracatheter Daily PRN Heath Lark, MD      . heparin lock flush 100 unit/mL  250 Units Intracatheter PRN Heath Lark, MD      . sodium chloride 0.9 % injection 10 mL  10 mL Intracatheter PRN Shaymus Eveleth, MD      . sodium chloride 0.9 % injection 3 mL  3 mL Intracatheter PRN Heath Lark, MD        PHYSICAL EXAMINATION: ECOG PERFORMANCE STATUS: 1 - Symptomatic but completely ambulatory  Filed Vitals:   05/31/14 1303  BP: 121/70  Pulse: 105  Temp: 98.8  F (37.1 C)  Resp: 18   Filed Weights   05/31/14 1303  Weight: 274 lb 1.6 oz (124.331 kg)    GENERAL:alert, no distress and comfortable. She is morbidly obese SKIN: skin color, texture, turgor are normal, no rashes or significant lesions EYES: normal, Conjunctiva are pale and non-injected, sclera clear OROPHARYNX:no exudate, no erythema and lips, buccal mucosa, and tongue normal  NECK: supple, thyroid normal size, non-tender, without nodularity LYMPH:  no palpable lymphadenopathy in the cervical, axillary or inguinal LUNGS: clear to auscultation and percussion with normal breathing effort HEART: regular rate & rhythm and no murmurs and no lower extremity edema ABDOMEN:abdomen soft, non-tender and normal bowel sounds. Unable to appreciate splenomegaly due to morbid obesity Musculoskeletal:no cyanosis of digits and no clubbing  NEURO: alert & oriented x 3 with fluent speech, no focal motor/sensory deficits  LABORATORY DATA:  I have reviewed the data as listed    Component Value Date/Time   NA 143 11/17/2013 0856   NA 140 03/26/2012 0902   K 4.3 11/17/2013 0856   K 4.1 03/26/2012 0902   CL 106 11/19/2012 0837   CL 107 03/26/2012 0902   CO2 28 11/17/2013 0856   CO2 26 03/26/2012 0902   GLUCOSE 171* 11/17/2013 0856   GLUCOSE 107* 11/19/2012 0837   GLUCOSE 105* 03/26/2012 0902   BUN 11.1 11/17/2013 0856   BUN 10 03/26/2012 0902   CREATININE 0.8 11/17/2013 0856   CREATININE 0.63 03/26/2012 0902   CALCIUM 9.6 11/17/2013 0856   CALCIUM 8.8 03/26/2012 0902   PROT 6.8 11/17/2013 0856   PROT 5.9* 03/26/2012 0902   ALBUMIN 3.6 11/17/2013 0856   ALBUMIN 3.8 03/26/2012 0902   AST 15 11/17/2013 0856   AST 15 03/26/2012 0902   ALT 15 11/17/2013 0856   ALT 10 03/26/2012 0902   ALKPHOS 121 11/17/2013 0856   ALKPHOS 84 03/26/2012 0902   BILITOT 0.55 11/17/2013 0856   BILITOT 0.5 03/26/2012 0902   GFRNONAA >60 08/16/2007 0425   GFRAA  Value: >60        The eGFR has been calculated using the MDRD equation. This calculation has  not been validated in all clinical 08/16/2007 0425    No results found for this basename: SPEP,  UPEP,   kappa and lambda light chains    Lab Results  Component Value Date   WBC 69.4* 05/31/2014   NEUTROABS 6.2 05/31/2014   HGB 7.9* 05/31/2014   HCT 27.3* 05/31/2014   MCV 70.2* 05/31/2014   PLT 146 05/31/2014      Chemistry      Component Value Date/Time   NA 143 11/17/2013 0856   NA 140 03/26/2012 0902   K 4.3 11/17/2013 0856   K 4.1 03/26/2012 0902   CL 106 11/19/2012 0837   CL 107 03/26/2012 0902   CO2 28 11/17/2013 0856   CO2 26 03/26/2012 0902  BUN 11.1 11/17/2013 0856   BUN 10 03/26/2012 0902   CREATININE 0.8 11/17/2013 0856   CREATININE 0.63 03/26/2012 0902      Component Value Date/Time   CALCIUM 9.6 11/17/2013 0856   CALCIUM 8.8 03/26/2012 0902   ALKPHOS 121 11/17/2013 0856   ALKPHOS 84 03/26/2012 0902   AST 15 11/17/2013 0856   AST 15 03/26/2012 0902   ALT 15 11/17/2013 0856   ALT 10 03/26/2012 0902   BILITOT 0.55 11/17/2013 0856   BILITOT 0.5 03/26/2012 0902     ASSESSMENT & PLAN:  Anemia in neoplastic disease We discussed some of the risks, benefits, and alternatives of blood transfusions. The patient is symptomatic from anemia and the hemoglobin level is critically low.  Some of the side-effects to be expected including risks of transfusion reactions, chills, infection, syndrome of volume overload and risk of hospitalization from various reasons and the patient is willing to proceed and went ahead to sign consent today. She will receive 2 units of blood transfusion  Splenic marginal zone b-cell lymphoma Prior bone marrow biopsies suggest possibility of follicular lymphoma but clinically, with splenomegaly, this is more classic for splenic marginal zone lymphoma. I recommend restaging PET scan and repeat bone marrow aspirate and biopsy and she agreed to proceed.  Leukocytosis This is related to lymphoma process. Recommend observation only.    Orders Placed This Encounter  Procedures   . NM PET Image Initial (PI) Skull Base To Thigh    Standing Status: Future     Number of Occurrences:      Standing Expiration Date: 07/31/2015    Order Specific Question:  Reason for Exam (SYMPTOM  OR DIAGNOSIS REQUIRED)    Answer:  staging lymphoma    Order Specific Question:  Is the patient pregnant?    Answer:  No    Order Specific Question:  Preferred imaging location?    Answer:  [facility]   All questions were answered. The patient knows to call the clinic with any problems, questions or concerns. No barriers to learning was detected. I spent 30 minutes counseling the patient face to face. The total time spent in the appointment was 40 minutes and more than 50% was on counseling and review of test results     [facility], [place], MD 05/31/2014 9:34 PM

## 2014-05-31 NOTE — Assessment & Plan Note (Signed)
Prior bone marrow biopsies suggest possibility of follicular lymphoma but clinically, with splenomegaly, this is more classic for splenic marginal zone lymphoma. I recommend restaging PET scan and repeat bone marrow aspirate and biopsy and she agreed to proceed.

## 2014-05-31 NOTE — Discharge Instructions (Signed)
Non-Hodgkin's Lymphoma Non-Hodgkin's lymphoma is a cancer that begins in the lymphoid tissue (part of your body's defense system, which protects the body from infections, germs, and diseases). Lymphocytes (a type of white blood cells) are found in the lymphoid tissue. Non-Hodgkin's lymphoma starts in lymphocytes. There are different types of non-Hodgkin's lymphoma. Your caregiver will help you understand the seriousness of your cancer. This will be based on the type of cells affected and how fast it is growing and spreading. CAUSES  Non-Hodgkin's lymphoma is a cancer that starts in lymphocytes. The cause of non-Hodgkin's lymphoma is not known. It is thought that viruses cause certain types of non-Hodgkin's lymphoma. But this is less common. The risk of getting this cancer increases if:   Your immune system (body's defense system) is weak, especially after an organ transplant.  You are an elderly white female.  You have a diet high in fat.  You are infected with certain viruses. SYMPTOMS  Non-Hodgkin's lymphoma can occur at any age. It can cause different symptoms such as:  Swelling of the lymph nodes.  Fever.  Excessive sweating.  Itchy skin.  Tiredness.  Weight loss.  Coughing, breathing trouble, and chest pain.  Weakness and tiredness that do not go away.  Pain, swelling. or a feeling of fullness in the abdomen. DIAGNOSIS  Your caregiver will examine you to check your general health and look for any lumps. Blood tests and biopsy (removing body tissue for testing) of the lymph node (gland) may be included. Your caregiver may suggest chest X-ray, scanning or lumbar puncture (collecting fluid from spinal column for testing).  TREATMENT  Non-Hodgkin's lymphoma can be treated in different ways. This depends on your symptoms, the stage of your cancer when you were first diagnosed, and the speed with which it is spreading. You and your caregiver will work together and decide on the best  plan. Treatment may include:  Radiation therapy (using radiation to destroy the cancer cells).  Chemotherapy (using drugs to destroy the cancer cells).  Biological therapy (using body's immune system to treat cancer) like monoclonal antibody therapy (using antibodies that can kill or block the cancer cells).  Newer types of treatment are vaccine therapy and high-dose chemotherapy with stem cell (a type of cell) transplant (introducing healthy stem cells into the body). However, these are still under development. You may remain symptom-free for 1 to 2 years, after treatment. Non-Hodgkin's lymphoma may recur. If it recurs, your caregiver will advise you on the suitable treatment. If you are pregnant and also have non-Hodgkin's lymphoma, you need immediate treatment. Your caregiver will decide the treatment that suits you the most.  SEEK MEDICAL CARE IF:   You develop new symptoms of non-Hodgkin's lymphoma.  You have non-Hodgkin's lymphoma and continuous fever. Document Released: 04/12/2007 Document Revised: 10/04/2013 Document Reviewed: 01/04/2014 Exit[employer] Patient Information 2015 [employer]. This information is not intended to replace advice given to you by your health care provider. Make sure you discuss any questions you have with your health care provider.   

## 2014-05-31 NOTE — Telephone Encounter (Signed)
Pt notified of BMBX on Tuesday, 8/25 @0700  in Short Stay with [facility]. Flow cytometry notified

## 2014-06-01 LAB — TYPE AND SCREEN
ABO/RH(D): B POS
Antibody Screen: NEGATIVE
Unit division: 0
Unit division: 0

## 2014-06-01 LAB — PREPARE RBC (CROSSMATCH)

## 2014-06-01 LAB — DIRECT ANTIGLOBULIN TEST (NOT AT ARMC)
DAT (Complement): NEGATIVE
DAT IgG: NEGATIVE

## 2014-06-01 LAB — HEPATITIS B SURFACE ANTIGEN: HEP B S AG: NEGATIVE

## 2014-06-01 LAB — HEPATITIS B SURFACE ANTIBODY,QUALITATIVE: Hep B S Ab: NEGATIVE

## 2014-06-01 LAB — HEPATITIS B CORE ANTIBODY, IGM: Hep B C IgM: NONREACTIVE

## 2014-06-01 LAB — SEDIMENTATION RATE: Sed Rate: 45 mm/hr — ABNORMAL HIGH (ref 0–22)

## 2014-06-02 ENCOUNTER — Telehealth: Payer: Self-pay | Admitting: Hematology and Oncology

## 2014-06-02 NOTE — Telephone Encounter (Signed)
lvm for pt regarding to Sept appt....mailed pt appt sched/avs and letter °

## 2014-06-05 ENCOUNTER — Encounter (HOSPITAL_COMMUNITY): Payer: Self-pay | Admitting: Pharmacist

## 2014-06-06 ENCOUNTER — Ambulatory Visit (HOSPITAL_COMMUNITY)
Admit: 2014-06-06 | Discharge: 2014-06-06 | Disposition: A | Discharge status: Home / Self Care | Payer: BC Managed Care – PPO | Source: Ambulatory Visit | Attending: Hematology and Oncology | Admitting: Hematology and Oncology

## 2014-06-06 ENCOUNTER — Encounter (HOSPITAL_COMMUNITY): Payer: Self-pay

## 2014-06-06 VITALS — BP 116/68 | HR 87 | Temp 97.6°F | Resp 16 | Ht 67.5 in | Wt 274.0 lb

## 2014-06-06 DIAGNOSIS — C8307 Small cell B-cell lymphoma, spleen: Secondary | ICD-10-CM

## 2014-06-06 DIAGNOSIS — C8589 Other specified types of non-Hodgkin lymphoma, extranodal and solid organ sites: Secondary | ICD-10-CM | POA: Insufficient documentation

## 2014-06-06 LAB — CBC WITH DIFFERENTIAL/PLATELET
BAND NEUTROPHILS: 0 % (ref 0–10)
Basophils Absolute: 0 10*3/uL (ref 0.0–0.1)
Basophils Relative: 0 % (ref 0–1)
Blasts: 0 %
Eosinophils Absolute: 0 10*3/uL (ref 0.0–0.7)
Eosinophils Relative: 0 % (ref 0–5)
HCT: 32.6 % — ABNORMAL LOW (ref 36.0–46.0)
Hemoglobin: 9.5 g/dL — ABNORMAL LOW (ref 12.0–15.0)
Lymphocytes Relative: 88 % — ABNORMAL HIGH (ref 12–46)
Lymphs Abs: 64.5 10*3/uL — ABNORMAL HIGH (ref 0.7–4.0)
MCH: 21.8 pg — ABNORMAL LOW (ref 26.0–34.0)
MCHC: 29.1 g/dL — AB (ref 30.0–36.0)
MCV: 74.9 fL — AB (ref 78.0–100.0)
METAMYELOCYTES PCT: 0 %
MONO ABS: 3.7 10*3/uL — AB (ref 0.1–1.0)
MONOS PCT: 5 % (ref 3–12)
Myelocytes: 0 %
NRBC: 0 /100{WBCs}
Neutro Abs: 5.1 10*3/uL (ref 1.7–7.7)
Neutrophils Relative %: 7 % — ABNORMAL LOW (ref 43–77)
PLATELETS: 145 10*3/uL — AB (ref 150–400)
Promyelocytes Absolute: 0 %
RBC: 4.35 MIL/uL (ref 3.87–5.11)
RDW: 20.9 % — AB (ref 11.5–15.5)
WBC: 73.3 10*3/uL — AB (ref 4.0–10.5)

## 2014-06-06 LAB — SAMPLE TO BLOOD BANK

## 2014-06-06 LAB — GLUCOSE, CAPILLARY: Glucose-Capillary: 110 mg/dL — ABNORMAL HIGH (ref 70–99)

## 2014-06-06 LAB — BONE MARROW EXAM

## 2014-06-06 MED ORDER — SODIUM CHLORIDE 0.9 % IV SOLN
Freq: Once | INTRAVENOUS | Status: AC
  Administered 2014-06-06: 07:00:00 via INTRAVENOUS

## 2014-06-06 MED ORDER — MIDAZOLAM HCL 2 MG/2ML IJ SOLN
INTRAMUSCULAR | Status: AC | PRN
  Administered 2014-06-06: 5 mg via INTRAVENOUS
  Administered 2014-06-06: 3 mg via INTRAVENOUS
  Administered 2014-06-06: 2 mg via INTRAVENOUS

## 2014-06-06 MED ORDER — MIDAZOLAM HCL 10 MG/2ML IJ SOLN
10.0000 mg | Freq: Once | INTRAMUSCULAR | Status: DC
  Filled 2014-06-06: qty 2

## 2014-06-06 MED ORDER — FENTANYL CITRATE 0.05 MG/ML IJ SOLN
INTRAMUSCULAR | Status: AC | PRN
  Administered 2014-06-06 (×3): 25 ug via INTRAVENOUS
  Administered 2014-06-06: 50 ug via INTRAVENOUS
  Administered 2014-06-06: 25 ug via INTRAVENOUS

## 2014-06-06 MED ORDER — FENTANYL CITRATE 0.05 MG/ML IJ SOLN
INTRAMUSCULAR | Status: AC
  Filled 2014-06-06: qty 2

## 2014-06-06 MED ORDER — FENTANYL CITRATE 0.05 MG/ML IJ SOLN
100.0000 ug | Freq: Once | INTRAMUSCULAR | Status: DC
  Filled 2014-06-06: qty 2

## 2014-06-06 NOTE — Procedures (Signed)
Brief examination was performed. ENT: adequate airway clearance Heart: regular rate and rhythm.No Murmurs Lungs: clear to auscultation, no wheezes, normal respiratory effort  American Society of Anesthesiologists ASA scale 2  Mallampati Score of 3  Bone Marrow Biopsy and Aspiration Procedure Note   Informed consent was obtained and potential risks including bleeding, infection and pain were reviewed with the patient. I verified that the patient has been fasting since midnight.  The patient's name, date of birth, identification, consent and allergies were verified prior to the start of procedure and time out was performed.  A total of 10 mg of IV Versed and 150 mcg of IV fentanyl were given.  The right posterior iliac crest was chosen as the site of biopsy.  The skin was prepped with Betadine solution.   8 cc of 1% lidocaine was used to provide local anaesthesia. Numerous attempts (around 5 times) were made to get aspirate but dry tap was encountered  1 inch biopsy was obtained  The procedure was tolerated well and there were no complications.  The patient was stable at the end of the procedure.  Specimens sent for flow cytometry, cytogenetics and additional studies.

## 2014-06-06 NOTE — Sedation Documentation (Signed)
Patient is resting comfortably. 

## 2014-06-06 NOTE — Discharge Instructions (Signed)
Bone Marrow Aspiration, Bone Marrow Biopsy °Care After °Read the instructions outlined below and refer to this sheet in the next few weeks. These discharge instructions provide you with general information on caring for yourself after you leave the hospital. Your caregiver may also give you specific instructions. While your treatment has been planned according to the most current medical practices available, unavoidable complications occasionally occur. If you have any problems or questions after discharge, call your caregiver. °FINDING OUT THE RESULTS OF YOUR TEST °Not all test results are available during your visit. If your test results are not back during the visit, make an appointment with your caregiver to find out the results. Do not assume everything is normal if you have not heard from your caregiver or the medical facility. It is important for you to follow up on all of your test results.  °HOME CARE INSTRUCTIONS  °You have had sedation and may be sleepy or dizzy. Your thinking may not be as clear as usual. For the next 24 hours: °· Only take over-the-counter or prescription medicines for pain, discomfort, and or fever as directed by your caregiver. °· Do not drink alcohol. °· Do not smoke. °· Do not drive. °· Do not make important legal decisions. °· Do not operate heavy machinery. °· Do not care for small children by yourself. °· Keep your dressing clean and dry. You may replace dressing with a bandage after 24 hours. °· You may take a bath or shower after 24 hours. °· Use an ice pack for 20 minutes every 2 hours while awake for pain as needed. °SEEK MEDICAL CARE IF:  °· There is redness, swelling, or increasing pain at the biopsy site. °· There is pus coming from the biopsy site. °· There is drainage from a biopsy site lasting longer than one day. °· An unexplained oral temperature above 102° F (38.9° C) develops. °SEEK IMMEDIATE MEDICAL CARE IF:  °· You develop a rash. °· You have difficulty  breathing. °· You develop any reaction or side effects to medications given. °Document Released: 04/18/2005 Document Revised: 12/22/2011 Document Reviewed: 09/26/2008 °ExitCare® Patient Information ©2015 [employer]. This information is not intended to replace advice given to you by your health care provider. Make sure you discuss any questions you have with your health care provider. °Conscious Sedation, Adult, Care After °Refer to this sheet in the next few weeks. These instructions provide you with information on caring for yourself after your procedure. Your health care provider may also give you more specific instructions. Your treatment has been planned according to current medical practices, but problems sometimes occur. Call your health care provider if you have any problems or questions after your procedure. °WHAT TO EXPECT AFTER THE PROCEDURE  °After your procedure: °· You may feel sleepy, clumsy, and have poor balance for several hours. °· Vomiting may occur if you eat too soon after the procedure. °HOME CARE INSTRUCTIONS °· Do not participate in any activities where you could become injured for at least 24 hours. Do not: °¨ Drive. °¨ Swim. °¨ Ride a bicycle. °¨ Operate heavy machinery. °¨ Cook. °¨ Use power tools. °¨ Climb ladders. °¨ Work from a high place. °· Do not make important decisions or sign legal documents until you are improved. °· If you vomit, drink water, juice, or soup when you can drink without vomiting. Make sure you have little or no nausea before eating solid foods. °· Only take over-the-counter or prescription medicines for pain, discomfort, or fever   as directed by your health care provider.  Make sure you and your family fully understand everything about the medicines given to you, including what side effects may occur.  You should not drink alcohol, take sleeping pills, or take medicines that cause drowsiness for at least 24 hours.  If you smoke, do not smoke without  supervision.  If you are feeling better, you may resume normal activities 24 hours after you were sedated.  Keep all appointments with your health care provider. SEEK MEDICAL CARE IF:  Your skin is pale or bluish in color.  You continue to feel nauseous or vomit.  Your pain is getting worse and is not helped by medicine.  You have bleeding or swelling.  You are still sleepy or feeling clumsy after 24 hours. SEEK IMMEDIATE MEDICAL CARE IF:  You develop a rash.  You have difficulty breathing.  You develop any type of allergic problem.  You have a fever. MAKE SURE YOU:  Understand these instructions.  Will watch your condition.  Will get help right away if you are not doing well or get worse. Document Released: 07/20/2013 Document Reviewed: 07/20/2013 [facility] Patient Information 2015 [place], [place]. This information is not intended to replace advice given to you by your health care provider. Make sure you discuss any questions you have with your health care provider.

## 2014-06-07 ENCOUNTER — Encounter (HOSPITAL_COMMUNITY)
Admission: RE | Admit: 2014-06-07 | Discharge: 2014-06-07 | Disposition: A | Discharge status: Home / Self Care | Payer: BC Managed Care – PPO | Source: Ambulatory Visit | Attending: Hematology and Oncology | Admitting: Hematology and Oncology

## 2014-06-07 DIAGNOSIS — C8307 Small cell B-cell lymphoma, spleen: Secondary | ICD-10-CM | POA: Diagnosis present

## 2014-06-07 LAB — GLUCOSE, CAPILLARY: GLUCOSE-CAPILLARY: 147 mg/dL — AB (ref 70–99)

## 2014-06-07 MED ORDER — FLUDEOXYGLUCOSE F - 18 (FDG) INJECTION
13.4000 | Freq: Once | INTRAVENOUS | Status: AC | PRN
  Administered 2014-06-07: 13.4 via INTRAVENOUS

## 2014-06-09 LAB — TISSUE HYBRIDIZATION (BONE MARROW)-NCBH

## 2014-06-13 ENCOUNTER — Other Ambulatory Visit: Payer: Self-pay | Admitting: Hematology and Oncology

## 2014-06-13 DIAGNOSIS — C8307 Small cell B-cell lymphoma, spleen: Secondary | ICD-10-CM

## 2014-06-14 ENCOUNTER — Other Ambulatory Visit (HOSPITAL_BASED_OUTPATIENT_CLINIC_OR_DEPARTMENT_OTHER): Payer: BC Managed Care – PPO

## 2014-06-14 ENCOUNTER — Telehealth: Payer: Self-pay | Admitting: *Deleted

## 2014-06-14 ENCOUNTER — Ambulatory Visit (HOSPITAL_BASED_OUTPATIENT_CLINIC_OR_DEPARTMENT_OTHER): Payer: BC Managed Care – PPO | Admitting: Hematology and Oncology

## 2014-06-14 ENCOUNTER — Encounter: Payer: Self-pay | Admitting: Hematology and Oncology

## 2014-06-14 ENCOUNTER — Telehealth: Payer: Self-pay | Admitting: Hematology and Oncology

## 2014-06-14 VITALS — BP 115/64 | HR 114 | Temp 98.6°F | Resp 20 | Ht 67.5 in | Wt 273.9 lb

## 2014-06-14 DIAGNOSIS — D630 Anemia in neoplastic disease: Secondary | ICD-10-CM

## 2014-06-14 DIAGNOSIS — C8307 Small cell B-cell lymphoma, spleen: Secondary | ICD-10-CM

## 2014-06-14 DIAGNOSIS — D72829 Elevated white blood cell count, unspecified: Secondary | ICD-10-CM

## 2014-06-14 DIAGNOSIS — Z23 Encounter for immunization: Secondary | ICD-10-CM

## 2014-06-14 LAB — CBC WITH DIFFERENTIAL/PLATELET
BASO%: 0.1 % (ref 0.0–2.0)
Basophils Absolute: 0.1 10*3/uL (ref 0.0–0.1)
EOS%: 0.2 % (ref 0.0–7.0)
Eosinophils Absolute: 0.2 10*3/uL (ref 0.0–0.5)
HCT: 33.6 % — ABNORMAL LOW (ref 34.8–46.6)
HGB: 9.8 g/dL — ABNORMAL LOW (ref 11.6–15.9)
LYMPH%: 88.9 % — AB (ref 14.0–49.7)
MCH: 21.1 pg — ABNORMAL LOW (ref 25.1–34.0)
MCHC: 29.1 g/dL — AB (ref 31.5–36.0)
MCV: 72.4 fL — AB (ref 79.5–101.0)
MONO#: 1 10*3/uL — ABNORMAL HIGH (ref 0.1–0.9)
MONO%: 1.1 % (ref 0.0–14.0)
NEUT#: 9.5 10*3/uL — ABNORMAL HIGH (ref 1.5–6.5)
NEUT%: 9.7 % — AB (ref 38.4–76.8)
PLATELETS: 149 10*3/uL (ref 145–400)
RBC: 4.65 10*6/uL (ref 3.70–5.45)
RDW: 24.5 % — ABNORMAL HIGH (ref 11.2–14.5)
WBC: 97.2 10*3/uL (ref 3.9–10.3)
lymph#: 86.4 10*3/uL — ABNORMAL HIGH (ref 0.9–3.3)

## 2014-06-14 LAB — COMPREHENSIVE METABOLIC PANEL (CC13)
ALT: 32 U/L (ref 0–55)
AST: 23 U/L (ref 5–34)
Albumin: 3.5 g/dL (ref 3.5–5.0)
Alkaline Phosphatase: 124 U/L (ref 40–150)
Anion Gap: 10 mEq/L (ref 3–11)
BUN: 12.3 mg/dL (ref 7.0–26.0)
CO2: 27 meq/L (ref 22–29)
Calcium: 9 mg/dL (ref 8.4–10.4)
Chloride: 102 mEq/L (ref 98–109)
Creatinine: 0.7 mg/dL (ref 0.6–1.1)
Glucose: 164 mg/dl — ABNORMAL HIGH (ref 70–140)
Potassium: 3.9 mEq/L (ref 3.5–5.1)
SODIUM: 139 meq/L (ref 136–145)
TOTAL PROTEIN: 6.8 g/dL (ref 6.4–8.3)
Total Bilirubin: 0.63 mg/dL (ref 0.20–1.20)

## 2014-06-14 LAB — LACTATE DEHYDROGENASE (CC13): LDH: 436 U/L — ABNORMAL HIGH (ref 125–245)

## 2014-06-14 LAB — URIC ACID (CC13): Uric Acid, Serum: 4.9 mg/dl (ref 2.6–7.4)

## 2014-06-14 LAB — HOLD TUBE, BLOOD BANK

## 2014-06-14 MED ORDER — PREDNISONE 50 MG PO TABS: 50.0000 mg | ORAL_TABLET | Freq: Every day | ORAL | Status: DC

## 2014-06-14 MED ORDER — INFLUENZA VAC SPLIT QUAD 0.5 ML IM SUSY
0.5000 mL | PREFILLED_SYRINGE | Freq: Once | INTRAMUSCULAR | Status: AC
  Administered 2014-06-14: 0.5 mL via INTRAMUSCULAR
  Filled 2014-06-14: qty 0.5

## 2014-06-14 MED ORDER — ALLOPURINOL 300 MG PO TABS: 300.0000 mg | ORAL_TABLET | Freq: Every day | ORAL | Status: DC

## 2014-06-14 NOTE — Telephone Encounter (Signed)
Pt confirmed labs/ov per 09/02 POF, sent msg to add chemo, gave pt AVS....KJ °

## 2014-06-14 NOTE — Assessment & Plan Note (Signed)
We discussed the role of chemotherapy. The intent is for cure.  We discussed some of the risks, benefits, side-effects of Rituximab.  Some of the short term side-effects included, though not limited to, risk of fatigue, allergic reactions, admission to hospital for various reasons, and risks of death.   The patient is aware that the response rates discussed earlier is not guaranteed.    After a long discussion, patient made an informed decision to proceed with the prescribed plan of care.   Patient education material was dispensed. Due to high burden of disease, I will start her on 50 mg of prednisone daily for 3 days. She would be at high-risk for tumor lysis syndrome and I was start her on allopurinol as well.

## 2014-06-14 NOTE — Telephone Encounter (Signed)
Per desk RN I have scheduled first time treatment and chemo class for patient. I have called and left the patient a message to call the office for appts.

## 2014-06-14 NOTE — Progress Notes (Signed)
[facility] OFFICE PROGRESS NOTE  Patient Care Team: Rogers Blocker, MD as PCP - General (Internal Medicine) Heath Lark, MD as Consulting Physician (Hematology and Oncology)  SUMMARY OF ONCOLOGIC HISTORY:   Splenic marginal zone b-cell lymphoma   01/11/2009 Initial Diagnosis Splenic marginal zone b-cell lymphoma   06/06/2014 Bone Marrow Biopsy Bone marrow aspirate and biopsy confirmed extensive involvement by CD20 positive non-Hodgkin's lymphoma.   06/07/2014 Imaging She had a PET scan which showed predominant splenic involvement.    INTERVAL HISTORY: Please see below for problem oriented charting. She is seen today to discuss test results. In the meantime, she feels well.  REVIEW OF SYSTEMS:   Constitutional: Denies fevers, chills or abnormal weight loss Eyes: Denies blurriness of vision Ears, nose, mouth, throat, and face: Denies mucositis or sore throat Respiratory: Denies cough, dyspnea or wheezes Cardiovascular: Denies palpitation, chest discomfort or lower extremity swelling Gastrointestinal:  Denies nausea, heartburn or change in bowel habits Skin: Denies abnormal skin rashes Lymphatics: Denies new lymphadenopathy or easy bruising Neurological:Denies numbness, tingling or new weaknesses Behavioral/Psych: Mood is stable, no new changes  All other systems were reviewed with the patient and are negative.  I have reviewed the past medical history, past surgical history, social history and family history with the patient and they are unchanged from previous note.  ALLERGIES:  is allergic to erythromycin; lovastatin; and tolnaftate.  MEDICATIONS:  Current Outpatient Prescriptions  Medication Sig Dispense Refill  . benazepril (LOTENSIN) 10 MG tablet Take 10 mg by mouth daily.       . Blood Glucose Monitoring Suppl (ONE TOUCH ULTRA MINI) W/DEVICE KIT       . ferrous sulfate 325 (65 FE) MG tablet Take 325 mg by mouth 2 (two) times daily.      . fexofenadine (ALLEGRA)  180 MG tablet Take 180 mg by mouth daily as needed for allergies or rhinitis.      . fish oil-omega-3 fatty acids 1000 MG capsule Take 1 g by mouth 2 (two) times daily.       . fluticasone (FLONASE) 50 MCG/ACT nasal spray Place 1 spray into both nostrils 2 (two) times daily as needed for allergies or rhinitis.      . furosemide (LASIX) 40 MG tablet Take 40 mg by mouth daily as needed (leg swelling).      Marland Kitchen ibuprofen (ADVIL,MOTRIN) 200 MG tablet Take 200 mg by mouth every 6 (six) hours as needed for moderate pain.       . metFORMIN (GLUCOPHAGE) 1000 MG tablet Take 1,000 mg by mouth 2 (two) times daily with a meal.        . ONE TOUCH ULTRA TEST test strip       . valACYclovir (VALTREX) 500 MG tablet Take 500 mg by mouth 2 (two) times daily. Takes for outbreaks only.      . vitamin C (ASCORBIC ACID) 500 MG tablet Take 500 mg by mouth daily.       Marland Kitchen allopurinol (ZYLOPRIM) 300 MG tablet Take 1 tablet (300 mg total) by mouth daily.  30 tablet  0  . predniSONE (DELTASONE) 50 MG tablet Take 1 tablet (50 mg total) by mouth daily with breakfast.  3 tablet  0   No current facility-administered medications for this visit.    PHYSICAL EXAMINATION: ECOG PERFORMANCE STATUS: 1 - Symptomatic but completely ambulatory  Filed Vitals:   06/14/14 1037  BP: 115/64  Pulse: 114  Temp: 98.6 F (37 C)  Resp: 20  Filed Weights   06/14/14 1037  Weight: 273 lb 14.4 oz (124.24 kg)    GENERAL:alert, no distress and comfortable. She is morbidly obese SKIN: skin color, texture, turgor are normal, no rashes or significant lesions EYES: normal, Conjunctiva are pale and non-injected, sclera clear OROPHARYNX:no exudate, no erythema and lips, buccal mucosa, and tongue normal  Musculoskeletal:no cyanosis of digits and no clubbing  NEURO: alert & oriented x 3 with fluent speech, no focal motor/sensory deficits  LABORATORY DATA:  I have reviewed the data as listed    Component Value Date/Time   NA 139 06/14/2014  1024   NA 140 03/26/2012 0902   K 3.9 06/14/2014 1024   K 4.1 03/26/2012 0902   CL 106 11/19/2012 0837   CL 107 03/26/2012 0902   CO2 27 06/14/2014 1024   CO2 26 03/26/2012 0902   GLUCOSE 164* 06/14/2014 1024   GLUCOSE 107* 11/19/2012 0837   GLUCOSE 105* 03/26/2012 0902   BUN 12.3 06/14/2014 1024   BUN 10 03/26/2012 0902   CREATININE 0.7 06/14/2014 1024   CREATININE 0.63 03/26/2012 0902   CALCIUM 9.0 06/14/2014 1024   CALCIUM 8.8 03/26/2012 0902   PROT 6.8 06/14/2014 1024   PROT 5.9* 03/26/2012 0902   ALBUMIN 3.5 06/14/2014 1024   ALBUMIN 3.8 03/26/2012 0902   AST 23 06/14/2014 1024   AST 15 03/26/2012 0902   ALT 32 06/14/2014 1024   ALT 10 03/26/2012 0902   ALKPHOS 124 06/14/2014 1024   ALKPHOS 84 03/26/2012 0902   BILITOT 0.63 06/14/2014 1024   BILITOT 0.5 03/26/2012 0902   GFRNONAA >60 08/16/2007 0425   GFRAA  Value: >60        The eGFR has been calculated using the MDRD equation. This calculation has not been validated in all clinical 08/16/2007 0425    No results found for this basename: SPEP, UPEP,  kappa and lambda light chains    Lab Results  Component Value Date   WBC 97.2* 06/14/2014   NEUTROABS 9.5* 06/14/2014   HGB 9.8* 06/14/2014   HCT 33.6* 06/14/2014   MCV 72.4* 06/14/2014   PLT 149 06/14/2014      Chemistry      Component Value Date/Time   NA 139 06/14/2014 1024   NA 140 03/26/2012 0902   K 3.9 06/14/2014 1024   K 4.1 03/26/2012 0902   CL 106 11/19/2012 0837   CL 107 03/26/2012 0902   CO2 27 06/14/2014 1024   CO2 26 03/26/2012 0902   BUN 12.3 06/14/2014 1024   BUN 10 03/26/2012 0902   CREATININE 0.7 06/14/2014 1024   CREATININE 0.63 03/26/2012 0902      Component Value Date/Time   CALCIUM 9.0 06/14/2014 1024   CALCIUM 8.8 03/26/2012 0902   ALKPHOS 124 06/14/2014 1024   ALKPHOS 84 03/26/2012 0902   AST 23 06/14/2014 1024   AST 15 03/26/2012 0902   ALT 32 06/14/2014 1024   ALT 10 03/26/2012 0902   BILITOT 0.63 06/14/2014 1024   BILITOT 0.5 03/26/2012 0902       RADIOGRAPHIC STUDIES: I reviewed the PET/CT scan with  her and her sister I have personally reviewed the radiological images as listed and agreed with the findings in the report.  ASSESSMENT & PLAN:  Splenic marginal zone b-cell lymphoma We discussed the role of chemotherapy. The intent is for cure.  We discussed some of the risks, benefits, side-effects of Rituximab.  Some of the short term side-effects included, though not limited  to, risk of fatigue, allergic reactions, admission to hospital for various reasons, and risks of death.   The patient is aware that the response rates discussed earlier is not guaranteed.    After a long discussion, patient made an informed decision to proceed with the prescribed plan of care.   Patient education material was dispensed. Due to high burden of disease, I will start her on 50 mg of prednisone daily for 3 days. She would be at high-risk for tumor lysis syndrome and I was start her on allopurinol as well.  Anemia in neoplastic disease This is likely anemia of chronic disease. The patient denies recent history of bleeding such as epistaxis, hematuria or hematochezia. She is asymptomatic from the anemia. We will observe for now.  She does not require transfusion now.    Leukocytosis This is related to her underlying lymphoproliferative disorder. I have sent her home on prednisone and will begin treatment this week.   Orders Placed This Encounter  Procedures  . Lactate dehydrogenase    Standing Status: Standing     Number of Occurrences: 3     Standing Expiration Date: 06/15/2015  . Uric Acid    Standing Status: Standing     Number of Occurrences: 3     Standing Expiration Date: 06/15/2015   All questions were answered. The patient knows to call the clinic with any problems, questions or concerns. No barriers to learning was detected. I spent 40 minutes counseling the patient face to face. The total time spent in the appointment was 55 minutes and more than 50% was on counseling and review of test  results     [facility], [place], MD 06/14/2014 8:53 PM

## 2014-06-14 NOTE — Assessment & Plan Note (Signed)
This is likely anemia of chronic disease. The patient denies recent history of bleeding such as epistaxis, hematuria or hematochezia. She is asymptomatic from the anemia. We will observe for now.  She does not require transfusion now.   

## 2014-06-14 NOTE — Telephone Encounter (Signed)
Per staff message and POF I have scheduled appts. Advised scheduler of appts. JMW  

## 2014-06-14 NOTE — Assessment & Plan Note (Signed)
This is related to her underlying lymphoproliferative disorder. I have sent her home on prednisone and will begin treatment this week.

## 2014-06-15 ENCOUNTER — Other Ambulatory Visit: Payer: Self-pay

## 2014-06-16 ENCOUNTER — Ambulatory Visit (HOSPITAL_BASED_OUTPATIENT_CLINIC_OR_DEPARTMENT_OTHER): Payer: BC Managed Care – PPO | Admitting: Nurse Practitioner

## 2014-06-16 ENCOUNTER — Ambulatory Visit (HOSPITAL_BASED_OUTPATIENT_CLINIC_OR_DEPARTMENT_OTHER): Payer: BC Managed Care – PPO

## 2014-06-16 VITALS — BP 125/82 | HR 100 | Temp 98.5°F | Resp 18

## 2014-06-16 DIAGNOSIS — R059 Cough, unspecified: Secondary | ICD-10-CM

## 2014-06-16 DIAGNOSIS — C8307 Small cell B-cell lymphoma, spleen: Secondary | ICD-10-CM

## 2014-06-16 DIAGNOSIS — Z5112 Encounter for antineoplastic immunotherapy: Secondary | ICD-10-CM

## 2014-06-16 DIAGNOSIS — T7840XA Allergy, unspecified, initial encounter: Secondary | ICD-10-CM

## 2014-06-16 DIAGNOSIS — R05 Cough: Secondary | ICD-10-CM

## 2014-06-16 DIAGNOSIS — R0602 Shortness of breath: Secondary | ICD-10-CM

## 2014-06-16 MED ORDER — SODIUM CHLORIDE 0.9 % IV SOLN
375.0000 mg/m2 | Freq: Once | INTRAVENOUS | Status: AC
  Administered 2014-06-16: 900 mg via INTRAVENOUS
  Filled 2014-06-16: qty 90

## 2014-06-16 MED ORDER — ACETAMINOPHEN 325 MG PO TABS
ORAL_TABLET | ORAL | Status: AC
  Filled 2014-06-16: qty 2

## 2014-06-16 MED ORDER — SODIUM CHLORIDE 0.9 % IV SOLN
Freq: Once | INTRAVENOUS | Status: AC
  Administered 2014-06-16: 11:00:00 via INTRAVENOUS

## 2014-06-16 MED ORDER — DIPHENHYDRAMINE HCL 25 MG PO CAPS
50.0000 mg | ORAL_CAPSULE | Freq: Once | ORAL | Status: AC
Start: 2014-06-16 — End: 2014-06-16
  Administered 2014-06-16: 50 mg via ORAL

## 2014-06-16 MED ORDER — DIPHENHYDRAMINE HCL 25 MG PO CAPS
ORAL_CAPSULE | ORAL | Status: AC
  Filled 2014-06-16: qty 2

## 2014-06-16 MED ORDER — ACETAMINOPHEN 325 MG PO TABS
650.0000 mg | ORAL_TABLET | Freq: Once | ORAL | Status: AC
  Administered 2014-06-16: 650 mg via ORAL

## 2014-06-16 MED ORDER — METHYLPREDNISOLONE SODIUM SUCC 125 MG IJ SOLR
125.0000 mg | Freq: Once | INTRAMUSCULAR | Status: AC | PRN
  Administered 2014-06-16: 125 mg via INTRAVENOUS

## 2014-06-16 MED ORDER — FAMOTIDINE IN NACL 20-0.9 MG/50ML-% IV SOLN
20.0000 mg | Freq: Once | INTRAVENOUS | Status: AC | PRN
  Administered 2014-06-16: 20 mg via INTRAVENOUS

## 2014-06-16 NOTE — Patient Instructions (Signed)
[facility] Discharge Instructions for Patients Receiving Chemotherapy  Today you received the following chemotherapy agents:  Rituxan  To help prevent nausea and vomiting after your treatment, we encourage you to take your nausea medication as ordered per MD.   If you develop nausea and vomiting that is not controlled by your nausea medication, call the clinic.   BELOW ARE SYMPTOMS THAT SHOULD BE REPORTED IMMEDIATELY:  *FEVER GREATER THAN 100.5 F  *CHILLS WITH OR WITHOUT FEVER  NAUSEA AND VOMITING THAT IS NOT CONTROLLED WITH YOUR NAUSEA MEDICATION  *UNUSUAL SHORTNESS OF BREATH  *UNUSUAL BRUISING OR BLEEDING  TENDERNESS IN MOUTH AND THROAT WITH OR WITHOUT PRESENCE OF ULCERS  *URINARY PROBLEMS  *BOWEL PROBLEMS  UNUSUAL RASH Items with * indicate a potential emergency and should be followed up as soon as possible.  Feel free to call the clinic you have any questions or concerns. The clinic phone number is [phone].    

## 2014-06-16 NOTE — Progress Notes (Signed)
1201- Pt complaining of "not feeling right", dry cough and slight SOB. Rituxan stopped.  VS stable.  Michel Harrow NP notified and to infusion room to assess patient.  Order received to administer Pepcid 20 mg IV now, wait 15 minutes after Pepcid infused and restart Rituxan if pt is without complaints.  1250-Pt is now without complaints and VS stable.  Rituxan restarted at 38 ml/hr at 1254.  1308-Dry cough noted.  Rituxan stopped.  Michel Harrow NP in infusion room to assess patient and order received to give Solumedrol 125 mg IV now.  1345-Patient is without cough or complaints at this time.  VS stable.  Rituxan restarted at 38 ml/hr at 1347.

## 2014-06-19 ENCOUNTER — Encounter (HOSPITAL_COMMUNITY): Payer: Self-pay | Admitting: Emergency Medicine

## 2014-06-19 ENCOUNTER — Emergency Department (HOSPITAL_COMMUNITY)
Admission: EM | Admit: 2014-06-19 | Discharge: 2014-06-19 | Disposition: A | Discharge status: Home / Self Care | Payer: BC Managed Care – PPO | Attending: Emergency Medicine | Admitting: Emergency Medicine

## 2014-06-19 DIAGNOSIS — T07XXXA Unspecified multiple injuries, initial encounter: Secondary | ICD-10-CM

## 2014-06-19 DIAGNOSIS — Y9229 Other specified public building as the place of occurrence of the external cause: Secondary | ICD-10-CM | POA: Insufficient documentation

## 2014-06-19 DIAGNOSIS — S0990XA Unspecified injury of head, initial encounter: Secondary | ICD-10-CM | POA: Insufficient documentation

## 2014-06-19 DIAGNOSIS — I10 Essential (primary) hypertension: Secondary | ICD-10-CM | POA: Diagnosis not present

## 2014-06-19 DIAGNOSIS — W010XXA Fall on same level from slipping, tripping and stumbling without subsequent striking against object, initial encounter: Secondary | ICD-10-CM | POA: Insufficient documentation

## 2014-06-19 DIAGNOSIS — E119 Type 2 diabetes mellitus without complications: Secondary | ICD-10-CM | POA: Diagnosis not present

## 2014-06-19 DIAGNOSIS — S20229A Contusion of unspecified back wall of thorax, initial encounter: Secondary | ICD-10-CM | POA: Diagnosis not present

## 2014-06-19 DIAGNOSIS — Z862 Personal history of diseases of the blood and blood-forming organs and certain disorders involving the immune mechanism: Secondary | ICD-10-CM | POA: Diagnosis not present

## 2014-06-19 DIAGNOSIS — Z79899 Other long term (current) drug therapy: Secondary | ICD-10-CM | POA: Diagnosis not present

## 2014-06-19 DIAGNOSIS — Y9389 Activity, other specified: Secondary | ICD-10-CM | POA: Diagnosis not present

## 2014-06-19 DIAGNOSIS — C8589 Other specified types of non-Hodgkin lymphoma, extranodal and solid organ sites: Secondary | ICD-10-CM | POA: Diagnosis not present

## 2014-06-19 DIAGNOSIS — IMO0002 Reserved for concepts with insufficient information to code with codable children: Secondary | ICD-10-CM | POA: Insufficient documentation

## 2014-06-19 DIAGNOSIS — Z791 Long term (current) use of non-steroidal anti-inflammatories (NSAID): Secondary | ICD-10-CM | POA: Insufficient documentation

## 2014-06-19 MED ORDER — METHOCARBAMOL 500 MG PO TABS: 500.0000 mg | ORAL_TABLET | Freq: Three times a day (TID) | ORAL | Status: DC | PRN

## 2014-06-19 MED ORDER — NAPROXEN 500 MG PO TABS: 500.0000 mg | ORAL_TABLET | Freq: Two times a day (BID) | ORAL | Status: DC

## 2014-06-19 NOTE — ED Notes (Addendum)
Pt reports she is currently being treated lymphoma with chemo, last chemo was Friday. Reports on Sunday she fell while in [employer] due to a puddle of water. Pt denies weakness or dizziness. [place] backwards, unsure if she hit her head. Denies LOC. Now having left sided headache, left back pain, left thigh pain. Pain 5/10. Pt has bruises on right knee and lower leg.

## 2014-06-19 NOTE — Discharge Instructions (Signed)
Contusion °A contusion is a deep bruise. Contusions are the result of an injury that caused bleeding under the skin. The contusion may turn blue, purple, or yellow. Minor injuries will give you a painless contusion, but more severe contusions may stay painful and swollen for a few weeks.  °CAUSES  °A contusion is usually caused by a blow, trauma, or direct force to an area of the body. °SYMPTOMS  °· Swelling and redness of the injured area. °· Bruising of the injured area. °· Tenderness and soreness of the injured area. °· Pain. °DIAGNOSIS  °The diagnosis can be made by taking a history and physical exam. An X-ray, CT scan, or MRI may be needed to determine if there were any associated injuries, such as fractures. °TREATMENT  °Specific treatment will depend on what area of the body was injured. In general, the best treatment for a contusion is resting, icing, elevating, and applying cold compresses to the injured area. Over-the-counter medicines may also be recommended for pain control. Ask your caregiver what the best treatment is for your contusion. °HOME CARE INSTRUCTIONS  °· Put ice on the injured area. °¨ Put ice in a plastic bag. °¨ Place a towel between your skin and the bag. °¨ Leave the ice on for 15-20 minutes, 3-4 times a day, or as directed by your health care provider. °· Only take over-the-counter or prescription medicines for pain, discomfort, or fever as directed by your caregiver. Your caregiver may recommend avoiding anti-inflammatory medicines (aspirin, ibuprofen, and naproxen) for 48 hours because these medicines may increase bruising. °· Rest the injured area. °· If possible, elevate the injured area to reduce swelling. °SEEK IMMEDIATE MEDICAL CARE IF:  °· You have increased bruising or swelling. °· You have pain that is getting worse. °· Your swelling or pain is not relieved with medicines. °MAKE SURE YOU:  °· Understand these instructions. °· Will watch your condition. °· Will get help right  away if you are not doing well or get worse. °Document Released: 07/09/2005 Document Revised: 10/04/2013 Document Reviewed: 08/04/2011 °Exit[employer]® Patient Information ©2015 [employer]. This information is not intended to replace advice given to you by your health care provider. Make sure you discuss any questions you have with your health care provider. ° °

## 2014-06-19 NOTE — ED Provider Notes (Signed)
CSN: [id number]     Arrival date & time 06/19/14  1225 History   First MD Initiated Contact with Patient 06/19/14 1326     Chief Complaint  Patient presents with  . Fall  . Headache  . ca pt       HPI  Patient presents with some back pain. 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 His issues holding onto a cart and slipped. I don't want. However should her feet before dinner had visual back. She did not strike her head. Complains of pain in the upper right posterior shoulder and left lower back. No numbness weakness or tingling. No neck pain. No headache. Not on blood thinners.  Should non-Hodgkin's lymphoma and splenomegaly. No abdominal pain.  Past Medical History  Diagnosis Date  . Leukocytosis 07/27/2011  . Hypertension   . Iron deficiency anemia due to chronic blood loss   . Fatigue 11/17/2013  . 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 Diabetes mellitus without complication   . 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 Non Hodgkin's lymphoma     recieving chemo 06/2014   History reviewed. No pertinent past surgical history. Family History  Problem Relation Age of Onset  . Cancer Maternal Aunt     breast ca  . Cancer Maternal Uncle     lung ca   History  Substance Use Topics  . Smoking status: Former Research scientist (life sciences)  . Smokeless tobacco: Never Used  . Alcohol Use: No   OB History   Grav Para Term Preterm Abortions TAB SAB Ect Mult Living                 Review of Systems  Constitutional: Negative for fever, chills, diaphoresis, appetite change and fatigue.  HENT: Negative for mouth sores, sore throat and trouble swallowing.   Eyes: Negative for visual disturbance.  Respiratory: Negative for cough, chest tightness, shortness of breath and wheezing.   Cardiovascular: Negative for chest pain.  Gastrointestinal: Negative for nausea, vomiting, abdominal pain, diarrhea and abdominal distention.  Endocrine: Negative for polydipsia,  polyphagia and polyuria.  Genitourinary: Negative for dysuria, frequency and hematuria.  Musculoskeletal: Positive for back pain and myalgias. Negative for gait problem.  Skin: Negative for color change, pallor and rash.  Neurological: Negative for dizziness, syncope, light-headedness and headaches.  Hematological: Does not bruise/bleed easily.  Psychiatric/Behavioral: Negative for behavioral problems and confusion.      Allergies  Erythromycin; Lovastatin; and Tolnaftate  Home Medications   Prior to Admission medications   Medication Sig Start Date End Date Taking? Authorizing Provider  allopurinol (ZYLOPRIM) 300 MG tablet Take 1 tablet (300 mg total) by mouth daily. 06/14/14  Yes Heath Lark, MD  benazepril (LOTENSIN) 10 MG tablet Take 10 mg by mouth daily.  05/02/14  Yes Historical Provider, MD  fish oil-omega-3 fatty acids 1000 MG capsule Take 1 g by mouth 2 (two) times daily.    Yes Historical Provider, MD  furosemide (LASIX) 40 MG tablet Take 40 mg by mouth daily as needed (leg swelling).   Yes Historical Provider, MD  metFORMIN (GLUCOPHAGE) 1000 MG tablet Take 1,000 mg by mouth 2 (two) times daily with a meal.     Yes Historical Provider, MD  riTUXimab (RITUXAN) 10 MG/ML injection Inject into the vein once a week.   Yes Historical Provider, MD  valACYclovir (VALTREX) 500 MG tablet Take 500 mg by mouth 2 (two) times daily. Takes for outbreaks only. 05/02/14  Yes Historical Provider, MD  vitamin C (ASCORBIC ACID) 500 MG tablet Take 500 mg by mouth daily.    Yes Historical Provider, MD  methocarbamol (ROBAXIN) 500 MG tablet Take 1 tablet (500 mg total) by mouth 3 (three) times daily between meals as needed. 06/19/14   Tanna Furry, MD  naproxen (NAPROSYN) 500 MG tablet Take 1 tablet (500 mg total) by mouth 2 (two) times daily. 06/19/14   Tanna Furry, MD  predniSONE (DELTASONE) 50 MG tablet Take 1 tablet (50 mg total) by mouth daily with breakfast. 06/14/14   Heath Lark, MD   BP 137/78  Pulse 72   Temp(Src) 98.2 F (36.8 C) (Oral)  Resp 20  SpO2 99%  LMP 06/17/2014 Physical Exam  Constitutional: She is oriented to person, place, and time. She appears well-developed and well-nourished. No distress.  HENT:  Head: Normocephalic.  Nontender over the scalp, skull, and midline neck and spine. No blood over the TMs mastoids ears nose or mouth.  Eyes: Conjunctivae are normal. Pupils are equal, round, and reactive to light. No scleral icterus.  Neck: Normal range of motion. Neck supple. No thyromegaly present.  Cardiovascular: Normal rate and regular rhythm.  Exam reveals no gallop and no friction rub.   No murmur heard. Pulmonary/Chest: Effort normal and breath sounds normal. No respiratory distress. She has no wheezes. She has no rales.  Abdominal: Soft. Bowel sounds are normal. She exhibits no distension. There is no tenderness. There is no rebound.  Soft benign abdomen. No reproducible tenderness. In particular left upper quadrant is nontender. Pain tip not palpable.  Musculoskeletal: Normal range of motion.       Back:  Neurological: She is alert and oriented to person, place, and time.  Skin: Skin is warm and dry. No rash noted.  Psychiatric: She has a normal mood and affect. Her behavior is normal.    ED Course  Procedures (including critical care time) Labs Review Labs Reviewed - No data to display  Imaging Review No results found.   EKG Interpretation None      MDM   Final diagnoses:  Multiple contusions    Normal exam. Not a big mechanism of fall. No headache or neck pain. Some left paraspinal muscular tenderness. No indications for imaging. Plan is expectant management. Symptomatic treatment with anti-inflammatory muscle relaxant.    Tanna Furry, MD 06/19/14 [phone]

## 2014-06-20 ENCOUNTER — Telehealth: Payer: Self-pay | Admitting: *Deleted

## 2014-06-20 NOTE — Telephone Encounter (Signed)
Message copied by Cherylynn Ridges on Tue Jun 20, 2014  5:07 PM ------      Message from: [place]      Created: Fri Jun 16, 2014  3:43 PM      Regarding: Chemo f/u call      Contact: [phone]       First time Rituxan on 06/16/14.  Reaction with SOB and cough x2.  Dr. Alvy Bimler. ------

## 2014-06-20 NOTE — Telephone Encounter (Signed)
Called Desiree [name] at [phone] number(s).  Message left requesting a return call for chemotherapy follow up.  Awaiting return call from patient.

## 2014-06-21 ENCOUNTER — Encounter: Payer: Self-pay | Admitting: Nurse Practitioner

## 2014-06-21 DIAGNOSIS — T7840XA Allergy, unspecified, initial encounter: Secondary | ICD-10-CM | POA: Insufficient documentation

## 2014-06-21 NOTE — Assessment & Plan Note (Signed)
Patient did develop some mild hypersensitivity reactions to her for cycle of Rituxan.  Her primary complaints were a dry cough and some very mild shortness of breath.  She was unclear if the shortness of breath was related to anxiety.  Patient denied any chest pain or chest pressure; and also denied any pain with inspiration. Patient was given Pepcid 20 mg IV and Solu-Medrol 125 mg-which did appear to alleviate all hypersensitivity reactions.  Patient was able to complete the full cycle of Rituxan this afternoon with no further issues.

## 2014-06-21 NOTE — Progress Notes (Signed)
[place]   Chief Complaint  Patient presents with  . Hypersensitivity Reaction    HPI: Desiree [name] 48 y.o. female diagnosed with lymphoma.  Here today to initiate Rituxan chemotherapy.  Patient did experience a mild hypersensitivity reaction to the Rituxan this afternoon.  She developed a dry cough and very mild shortness of breath.  Patient does admit to being anxious I regarding her first chemotherapy treatment.  The patient denies any throat swelling or airway issues.  The patient was given both Pepcid 20 mg IV and Solu-Medrol 125 mg IV-which did appear to manage all reaction symptoms.  Also confirm the patient did take a prednisone 50 mg a for a total of 3 days prior to her chemotherapy-with the last dose of prednisone just this morning.  Vital signs remained stable throughout the day.  Patient denied any recent fevers or chills.  Hypersensitivity Reaction    ROS  Past Medical History  Diagnosis Date  . Leukocytosis 07/27/2011  . Hypertension   . Iron deficiency anemia due to chronic blood loss   . Fatigue 11/17/2013  . 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 Diabetes mellitus without complication   . 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 Non Hodgkin's lymphoma     recieving chemo 06/2014    History reviewed. No pertinent past surgical history.  has HSV; DM; OTHER DRUG ALLERGY; Leukocytosis; Splenic marginal zone b-cell lymphoma; Hypertension; Iron deficiency anemia due to chronic blood loss; Fatigue; Anemia in neoplastic disease; and Hypersensitivity reaction on her problem list.     is allergic to erythromycin; lovastatin; and tolnaftate.    Medication List       This list is accurate as of: 06/16/14 11:59 PM.  Always use your most recent med list.               allopurinol 300 MG tablet  Commonly known as:  ZYLOPRIM  Take 1 tablet (300 mg total) by mouth daily.     benazepril 10 MG tablet  Commonly  known as:  LOTENSIN  Take 10 mg by mouth daily.     ferrous sulfate 325 (65 FE) MG tablet  Take 325 mg by mouth 2 (two) times daily.     fexofenadine 180 MG tablet  Commonly known as:  ALLEGRA  Take 180 mg by mouth daily as needed for allergies or rhinitis.     fish oil-omega-3 fatty acids 1000 MG capsule  Take 1 g by mouth 2 (two) times daily.     fluticasone 50 MCG/ACT nasal spray  Commonly known as:  FLONASE  Place 1 spray into both nostrils 2 (two) times daily as needed for allergies or rhinitis.     furosemide 40 MG tablet  Commonly known as:  LASIX  Take 40 mg by mouth daily as needed (leg swelling).     ibuprofen 200 MG tablet  Commonly known as:  ADVIL,MOTRIN  Take 200 mg by mouth every 6 (six) hours as needed for moderate pain.     metFORMIN 1000 MG tablet  Commonly known as:  GLUCOPHAGE  Take 1,000 mg by mouth 2 (two) times daily with a meal.     ONE TOUCH ULTRA MINI W/DEVICE Kit     ONE TOUCH ULTRA TEST test strip  Generic drug:  glucose blood     predniSONE 50 MG tablet  Commonly known as:  DELTASONE  Take 1 tablet (50 mg total) by mouth daily with  breakfast.     valACYclovir 500 MG tablet  Commonly known as:  VALTREX  Take 500 mg by mouth 2 (two) times daily. Takes for outbreaks only.     vitamin C 500 MG tablet  Commonly known as:  ASCORBIC ACID  Take 500 mg by mouth daily.         PHYSICAL EXAMINATION  Vitals: BP 125/82, HR 100, temp 98.5, sat 100%  Last menstrual period 05/17/2014.  Physical Exam  Nursing note and vitals reviewed. Constitutional: She is oriented to person, place, and time and well-developed, well-nourished, and in no distress. No distress.  HENT:  Head: Normocephalic and atraumatic.  Eyes: Conjunctivae are normal. Pupils are equal, round, and reactive to light.  Neck: Normal range of motion. Neck supple.  Cardiovascular: Normal rate, regular rhythm, normal heart sounds and intact distal pulses.   Pulmonary/Chest: Effort  normal and breath sounds normal. No respiratory distress. She has no wheezes. She has no rales.  Abdominal: Soft. Bowel sounds are normal. She exhibits no distension. There is no tenderness.  Musculoskeletal: Normal range of motion. She exhibits no edema and no tenderness.  Neurological: She is alert and oriented to person, place, and time. Gait normal.  Skin: Skin is warm and dry.  Psychiatric: Affect normal.   ASSESSMENT/PLAN:    Splenic marginal zone b-cell lymphoma  Assessment & Plan Patient was able to complete her first cycle of Rituxan this afternoon.  Patient has plans to return in approximately one week of for cycle 2 of the same regimen.   Hypersensitivity reaction  Assessment & Plan Patient did develop some mild hypersensitivity reactions to her for cycle of Rituxan.  Her primary complaints were a dry cough and some very mild shortness of breath.  She was unclear if the shortness of breath was related to anxiety.  Patient denied any chest pain or chest pressure; and also denied any pain with inspiration. Patient was given Pepcid 20 mg IV and Solu-Medrol 125 mg-which did appear to alleviate all hypersensitivity reactions.  Patient was able to complete the full cycle of Rituxan this afternoon with no further issues.   Patient stated understanding of all instructions; and was in agreement with this plan of care. The patient knows to call the clinic with any problems, questions or concerns.   Review/collaboration with Dr. Alvy Bimler regarding all aspects of patient's visit today.   Total time spent with patient was  40 minutes;  with greater than  80  percent of that time spent in face to face counseling regarding  her symptoms, frequent monitoring of patient in the infusion area,  and coordination of care and follow up.  Disclaimer: This note was dictated with voice recognition software. Similar sounding words can inadvertently be transcribed and may not be corrected upon review.    Drue Second, NP 06/21/2014

## 2014-06-21 NOTE — Assessment & Plan Note (Signed)
Patient was able to complete her first cycle of Rituxan this afternoon.  Patient has plans to return in approximately one week of for cycle 2 of the same regimen.

## 2014-06-23 ENCOUNTER — Other Ambulatory Visit (HOSPITAL_BASED_OUTPATIENT_CLINIC_OR_DEPARTMENT_OTHER): Payer: BC Managed Care – PPO

## 2014-06-23 ENCOUNTER — Other Ambulatory Visit: Payer: Self-pay

## 2014-06-23 ENCOUNTER — Ambulatory Visit (HOSPITAL_BASED_OUTPATIENT_CLINIC_OR_DEPARTMENT_OTHER): Payer: BC Managed Care – PPO | Admitting: Nurse Practitioner

## 2014-06-23 ENCOUNTER — Ambulatory Visit (HOSPITAL_BASED_OUTPATIENT_CLINIC_OR_DEPARTMENT_OTHER): Payer: BC Managed Care – PPO

## 2014-06-23 ENCOUNTER — Telehealth: Payer: Self-pay | Admitting: Hematology and Oncology

## 2014-06-23 VITALS — BP 127/69 | HR 110 | Temp 97.4°F | Resp 20

## 2014-06-23 DIAGNOSIS — Z23 Encounter for immunization: Secondary | ICD-10-CM

## 2014-06-23 DIAGNOSIS — C8307 Small cell B-cell lymphoma, spleen: Secondary | ICD-10-CM

## 2014-06-23 DIAGNOSIS — R000 Tachycardia, unspecified: Secondary | ICD-10-CM

## 2014-06-23 DIAGNOSIS — D630 Anemia in neoplastic disease: Secondary | ICD-10-CM

## 2014-06-23 DIAGNOSIS — Z5112 Encounter for antineoplastic immunotherapy: Secondary | ICD-10-CM

## 2014-06-23 LAB — CBC WITH DIFFERENTIAL/PLATELET
BASO%: 0.9 % (ref 0.0–2.0)
Basophils Absolute: 0.1 10*3/uL (ref 0.0–0.1)
EOS%: 1.7 % (ref 0.0–7.0)
Eosinophils Absolute: 0.1 10*3/uL (ref 0.0–0.5)
HCT: 33 % — ABNORMAL LOW (ref 34.8–46.6)
HGB: 10 g/dL — ABNORMAL LOW (ref 11.6–15.9)
LYMPH#: 2.4 10*3/uL (ref 0.9–3.3)
LYMPH%: 34.1 % (ref 14.0–49.7)
MCH: 21.8 pg — ABNORMAL LOW (ref 25.1–34.0)
MCHC: 30.3 g/dL — AB (ref 31.5–36.0)
MCV: 72.1 fL — ABNORMAL LOW (ref 79.5–101.0)
MONO#: 0.6 10*3/uL (ref 0.1–0.9)
MONO%: 8.6 % (ref 0.0–14.0)
NEUT#: 3.8 10*3/uL (ref 1.5–6.5)
NEUT%: 54.7 % (ref 38.4–76.8)
Platelets: 85 10*3/uL — ABNORMAL LOW (ref 145–400)
RBC: 4.58 10*6/uL (ref 3.70–5.45)
RDW: 21.2 % — AB (ref 11.2–14.5)
WBC: 7 10*3/uL (ref 3.9–10.3)
nRBC: 1 % — ABNORMAL HIGH (ref 0–0)

## 2014-06-23 LAB — COMPREHENSIVE METABOLIC PANEL (CC13)
ALT: 16 U/L (ref 0–55)
AST: 11 U/L (ref 5–34)
Albumin: 3.4 g/dL — ABNORMAL LOW (ref 3.5–5.0)
Alkaline Phosphatase: 109 U/L (ref 40–150)
Anion Gap: 12 meq/L — ABNORMAL HIGH (ref 3–11)
BUN: 9.7 mg/dL (ref 7.0–26.0)
CO2: 25 meq/L (ref 22–29)
Calcium: 8.9 mg/dL (ref 8.4–10.4)
Chloride: 101 meq/L (ref 98–109)
Creatinine: 0.8 mg/dL (ref 0.6–1.1)
Glucose: 321 mg/dL — ABNORMAL HIGH (ref 70–140)
Potassium: 3.8 meq/L (ref 3.5–5.1)
Sodium: 139 meq/L (ref 136–145)
Total Bilirubin: 0.81 mg/dL (ref 0.20–1.20)
Total Protein: 6.4 g/dL (ref 6.4–8.3)

## 2014-06-23 LAB — URIC ACID (CC13): Uric Acid, Serum: 3.9 mg/dL (ref 2.6–7.4)

## 2014-06-23 LAB — LACTATE DEHYDROGENASE (CC13): LDH: 323 U/L — AB (ref 125–245)

## 2014-06-23 MED ORDER — ACETAMINOPHEN 325 MG PO TABS
650.0000 mg | ORAL_TABLET | Freq: Once | ORAL | Status: AC
  Administered 2014-06-23: 650 mg via ORAL

## 2014-06-23 MED ORDER — SODIUM CHLORIDE 0.9 % IV SOLN
Freq: Once | INTRAVENOUS | Status: AC
  Administered 2014-06-23: 10:00:00 via INTRAVENOUS

## 2014-06-23 MED ORDER — DIPHENHYDRAMINE HCL 25 MG PO CAPS
ORAL_CAPSULE | ORAL | Status: AC
  Filled 2014-06-23: qty 2

## 2014-06-23 MED ORDER — ACETAMINOPHEN 325 MG PO TABS
ORAL_TABLET | ORAL | Status: AC
  Filled 2014-06-23: qty 2

## 2014-06-23 MED ORDER — METHYLPREDNISOLONE SODIUM SUCC 125 MG IJ SOLR
INTRAMUSCULAR | Status: AC
  Filled 2014-06-23: qty 2

## 2014-06-23 MED ORDER — DIPHENHYDRAMINE HCL 25 MG PO CAPS
50.0000 mg | ORAL_CAPSULE | Freq: Once | ORAL | Status: AC
  Administered 2014-06-23: 50 mg via ORAL

## 2014-06-23 MED ORDER — METHYLPREDNISOLONE SODIUM SUCC 125 MG IJ SOLR
125.0000 mg | Freq: Once | INTRAMUSCULAR | Status: AC
  Administered 2014-06-23: 125 mg via INTRAVENOUS

## 2014-06-23 MED ORDER — SODIUM CHLORIDE 0.9 % IV SOLN
375.0000 mg/m2 | Freq: Once | INTRAVENOUS | Status: AC
  Administered 2014-06-23: 900 mg via INTRAVENOUS
  Filled 2014-06-23: qty 90

## 2014-06-23 NOTE — Patient Instructions (Signed)
[facility] Discharge Instructions for Patients Receiving Chemotherapy  Today you received the following chemotherapy agents: Rituxan.  To help prevent nausea and vomiting after your treatment, we encourage you to take your nausea medication as prescribed.   If you develop nausea and vomiting that is not controlled by your nausea medication, call the clinic.   BELOW ARE SYMPTOMS THAT SHOULD BE REPORTED IMMEDIATELY:  *FEVER GREATER THAN 100.5 F  *CHILLS WITH OR WITHOUT FEVER  NAUSEA AND VOMITING THAT IS NOT CONTROLLED WITH YOUR NAUSEA MEDICATION  *UNUSUAL SHORTNESS OF BREATH  *UNUSUAL BRUISING OR BLEEDING  TENDERNESS IN MOUTH AND THROAT WITH OR WITHOUT PRESENCE OF ULCERS  *URINARY PROBLEMS  *BOWEL PROBLEMS  UNUSUAL RASH Items with * indicate a potential emergency and should be followed up as soon as possible.  Feel free to call the clinic you have any questions or concerns. The clinic phone number is [phone].   Rituximab injection What is this medicine? RITUXIMAB (ri TUX i mab) is a monoclonal antibody. This medicine changes the way the body's immune system works. It is used commonly to treat non-Hodgkin's lymphoma and other conditions. In cancer cells, this drug targets a specific protein within cancer cells and stops the cancer cells from growing. It is also used to treat rhuematoid arthritis (RA). In RA, this medicine slow the inflammatory process and help reduce joint pain and swelling. This medicine is often used with other cancer or arthritis medications. This medicine may be used for other purposes; ask your health care provider or pharmacist if you have questions. COMMON BRAND NAME(S): Rituxan What should I tell my health care provider before I take this medicine? They need to know if you have any of these conditions: -blood disorders -heart disease -history of hepatitis B -infection (especially a virus infection such as chickenpox, cold sores,  or herpes) -irregular heartbeat -kidney disease -lung or breathing disease, like asthma -lupus -an unusual or allergic reaction to rituximab, mouse proteins, other medicines, foods, dyes, or preservatives -pregnant or trying to get pregnant -breast-feeding How should I use this medicine? This medicine is for infusion into a vein. It is administered in a hospital or clinic by a specially trained health care professional. A special MedGuide will be given to you by the pharmacist with each prescription and refill. Be sure to read this information carefully each time. Talk to your pediatrician regarding the use of this medicine in children. This medicine is not approved for use in children. Overdosage: If you think you have taken too much of this medicine contact a poison control center or emergency room at once. NOTE: This medicine is only for you. Do not share this medicine with others. What if I miss a dose? It is important not to miss a dose. Call your doctor or health care professional if you are unable to keep an appointment. What may interact with this medicine? -cisplatin -medicines for blood pressure -some other medicines for arthritis -vaccines This list may not describe all possible interactions. Give your health care provider a list of all the medicines, herbs, non-prescription drugs, or dietary supplements you use. Also tell them if you smoke, drink alcohol, or use illegal drugs. Some items may interact with your medicine. What should I watch for while using this medicine? Report any side effects that you notice during your treatment right away, such as changes in your breathing, fever, chills, dizziness or lightheadedness. These effects are more common with the first dose. Visit your prescriber or  health care professional for checks on your progress. You will need to have regular blood work. Report any other side effects. The side effects of this medicine can continue after you  finish your treatment. Continue your course of treatment even though you feel ill unless your doctor tells you to stop. Call your doctor or health care professional for advice if you get a fever, chills or sore throat, or other symptoms of a cold or flu. Do not treat yourself. This drug decreases your body's ability to fight infections. Try to avoid being around people who are sick. This medicine may increase your risk to bruise or bleed. Call your doctor or health care professional if you notice any unusual bleeding. Be careful brushing and flossing your teeth or using a toothpick because you may get an infection or bleed more easily. If you have any dental work done, tell your dentist you are receiving this medicine. Avoid taking products that contain aspirin, acetaminophen, ibuprofen, naproxen, or ketoprofen unless instructed by your doctor. These medicines may hide a fever. Do not become pregnant while taking this medicine. Women should inform their doctor if they wish to become pregnant or think they might be pregnant. There is a potential for serious side effects to an unborn child. Talk to your health care professional or pharmacist for more information. Do not breast-feed an infant while taking this medicine. What side effects may I notice from receiving this medicine? Side effects that you should report to your doctor or health care professional as soon as possible: -allergic reactions like skin rash, itching or hives, swelling of the face, lips, or tongue -low blood counts - this medicine may decrease the number of white blood cells, red blood cells and platelets. You may be at increased risk for infections and bleeding. -signs of infection - fever or chills, cough, sore throat, pain or difficulty passing urine -signs of decreased platelets or bleeding - bruising, pinpoint red spots on the skin, black, tarry stools, blood in the urine -signs of decreased red blood cells - unusually weak or  tired, fainting spells, lightheadedness -breathing problems -confused, not responsive -chest pain -fast, irregular heartbeat -feeling faint or lightheaded, falls -mouth sores -redness, blistering, peeling or loosening of the skin, including inside the mouth -stomach pain -swelling of the ankles, feet, or hands -trouble passing urine or change in the amount of urine Side effects that usually do not require medical attention (report to your doctor or other health care professional if they continue or are bothersome): -anxiety -headache -loss of appetite -muscle aches -nausea -night sweats This list may not describe all possible side effects. Call your doctor for medical advice about side effects. You may report side effects to FDA at 1-800-FDA-1088. Where should I keep my medicine? This drug is given in a hospital or clinic and will not be stored at home. NOTE: This sheet is a summary. It may not cover all possible information. If you have questions about this medicine, talk to your doctor, pharmacist, or health care provider.  2015, [employer]vier/Gold Standard. (2008-05-29 14:04:59)

## 2014-06-23 NOTE — Telephone Encounter (Signed)
per [facility] add appt pt in tx.

## 2014-06-23 NOTE — Progress Notes (Signed)
Dr Benay Spice reviewed all labs and notified of pt's elevated HR and okay to proceed with treatment today.  125 mg solumedrol IVP ordered in addition to pre meds per Dr. Benay Spice due to previous reaction.  Rituxan titrated as if first time dose due to previous reaction. Will continue to monitor patient closely during infusion.   1430: Pt continues to have tachycardia. States she can feel her heart beating fast. Otherwise states she feels fine.  Denies chest pain, sob, N/V.  Vital signs are otherwise stable. Selena Lesser, NP notified and EKG ordered. States she will be by to assess pt.  1520: Pt discharged home per Selena Lesser, NP. Vital signs stable, HR 110. Pt denies any other symptoms, educated to go to ED if develops chest pain, shortness of breath, N/V or worsening or concerning symptoms with her elevated HR.  Pt verbalized understanding. Pt ambulatory and in no distress upon discharge with mother at side.

## 2014-06-30 ENCOUNTER — Telehealth: Payer: Self-pay | Admitting: Hematology and Oncology

## 2014-06-30 ENCOUNTER — Ambulatory Visit (HOSPITAL_BASED_OUTPATIENT_CLINIC_OR_DEPARTMENT_OTHER): Payer: BC Managed Care – PPO | Admitting: Hematology and Oncology

## 2014-06-30 ENCOUNTER — Encounter: Payer: Self-pay | Admitting: *Deleted

## 2014-06-30 ENCOUNTER — Other Ambulatory Visit (HOSPITAL_BASED_OUTPATIENT_CLINIC_OR_DEPARTMENT_OTHER): Payer: BC Managed Care – PPO

## 2014-06-30 ENCOUNTER — Encounter: Payer: Self-pay | Admitting: Hematology and Oncology

## 2014-06-30 ENCOUNTER — Ambulatory Visit (HOSPITAL_BASED_OUTPATIENT_CLINIC_OR_DEPARTMENT_OTHER): Payer: BC Managed Care – PPO

## 2014-06-30 VITALS — BP 117/68 | HR 106 | Temp 98.4°F | Resp 18

## 2014-06-30 VITALS — BP 119/74 | HR 106 | Temp 98.4°F | Resp 18 | Ht 67.5 in | Wt 272.5 lb

## 2014-06-30 DIAGNOSIS — C8588 Other specified types of non-Hodgkin lymphoma, lymph nodes of multiple sites: Secondary | ICD-10-CM

## 2014-06-30 DIAGNOSIS — D500 Iron deficiency anemia secondary to blood loss (chronic): Secondary | ICD-10-CM

## 2014-06-30 DIAGNOSIS — R002 Palpitations: Secondary | ICD-10-CM

## 2014-06-30 DIAGNOSIS — C8307 Small cell B-cell lymphoma, spleen: Secondary | ICD-10-CM

## 2014-06-30 DIAGNOSIS — Z5112 Encounter for antineoplastic immunotherapy: Secondary | ICD-10-CM

## 2014-06-30 DIAGNOSIS — L708 Other acne: Secondary | ICD-10-CM

## 2014-06-30 DIAGNOSIS — L709 Acne, unspecified: Secondary | ICD-10-CM | POA: Insufficient documentation

## 2014-06-30 DIAGNOSIS — Z23 Encounter for immunization: Secondary | ICD-10-CM

## 2014-06-30 DIAGNOSIS — E119 Type 2 diabetes mellitus without complications: Secondary | ICD-10-CM

## 2014-06-30 HISTORY — DX: Palpitations: R00.2

## 2014-06-30 LAB — COMPREHENSIVE METABOLIC PANEL (CC13)
ALK PHOS: 106 U/L (ref 40–150)
ALT: 21 U/L (ref 0–55)
AST: 14 U/L (ref 5–34)
Albumin: 3.4 g/dL — ABNORMAL LOW (ref 3.5–5.0)
Anion Gap: 12 mEq/L — ABNORMAL HIGH (ref 3–11)
BILIRUBIN TOTAL: 0.61 mg/dL (ref 0.20–1.20)
BUN: 9.7 mg/dL (ref 7.0–26.0)
CO2: 20 mEq/L — ABNORMAL LOW (ref 22–29)
CREATININE: 0.8 mg/dL (ref 0.6–1.1)
Calcium: 8.9 mg/dL (ref 8.4–10.4)
Chloride: 102 mEq/L (ref 98–109)
Glucose: 406 mg/dl — ABNORMAL HIGH (ref 70–140)
Potassium: 4.2 mEq/L (ref 3.5–5.1)
SODIUM: 134 meq/L — AB (ref 136–145)
TOTAL PROTEIN: 6.3 g/dL — AB (ref 6.4–8.3)

## 2014-06-30 LAB — CBC WITH DIFFERENTIAL/PLATELET
BASO%: 2.5 % — ABNORMAL HIGH (ref 0.0–2.0)
Basophils Absolute: 0.1 10*3/uL (ref 0.0–0.1)
EOS%: 1.8 % (ref 0.0–7.0)
Eosinophils Absolute: 0.1 10*3/uL (ref 0.0–0.5)
HCT: 32.7 % — ABNORMAL LOW (ref 34.8–46.6)
HGB: 10 g/dL — ABNORMAL LOW (ref 11.6–15.9)
LYMPH%: 33.3 % (ref 14.0–49.7)
MCH: 21.7 pg — AB (ref 25.1–34.0)
MCHC: 30.5 g/dL — AB (ref 31.5–36.0)
MCV: 71.1 fL — ABNORMAL LOW (ref 79.5–101.0)
MONO#: 0.4 10*3/uL (ref 0.1–0.9)
MONO%: 6.9 % (ref 0.0–14.0)
NEUT#: 3.2 10*3/uL (ref 1.5–6.5)
NEUT%: 55.5 % (ref 38.4–76.8)
Platelets: 183 10*3/uL (ref 145–400)
RBC: 4.6 10*6/uL (ref 3.70–5.45)
RDW: 25.3 % — ABNORMAL HIGH (ref 11.2–14.5)
WBC: 5.8 10*3/uL (ref 3.9–10.3)
lymph#: 1.9 10*3/uL (ref 0.9–3.3)

## 2014-06-30 LAB — LACTATE DEHYDROGENASE (CC13): LDH: 267 U/L — ABNORMAL HIGH (ref 125–245)

## 2014-06-30 LAB — URIC ACID (CC13): Uric Acid, Serum: 3.3 mg/dl (ref 2.6–7.4)

## 2014-06-30 MED ORDER — METHYLPREDNISOLONE SODIUM SUCC 40 MG IJ SOLR
INTRAMUSCULAR | Status: AC
  Filled 2014-06-30: qty 1

## 2014-06-30 MED ORDER — ACETAMINOPHEN 325 MG PO TABS
650.0000 mg | ORAL_TABLET | Freq: Once | ORAL | Status: AC
  Administered 2014-06-30: 650 mg via ORAL

## 2014-06-30 MED ORDER — DIPHENHYDRAMINE HCL 25 MG PO CAPS
ORAL_CAPSULE | ORAL | Status: AC
  Filled 2014-06-30: qty 2

## 2014-06-30 MED ORDER — SODIUM CHLORIDE 0.9 % IV SOLN
375.0000 mg/m2 | Freq: Once | INTRAVENOUS | Status: AC
  Administered 2014-06-30: 900 mg via INTRAVENOUS
  Filled 2014-06-30: qty 90

## 2014-06-30 MED ORDER — DIPHENHYDRAMINE HCL 25 MG PO CAPS
50.0000 mg | ORAL_CAPSULE | Freq: Once | ORAL | Status: AC
  Administered 2014-06-30: 50 mg via ORAL

## 2014-06-30 MED ORDER — ACETAMINOPHEN 325 MG PO TABS
ORAL_TABLET | ORAL | Status: AC
Start: 2014-06-30 — End: 2014-06-30
  Filled 2014-06-30: qty 2

## 2014-06-30 MED ORDER — SODIUM CHLORIDE 0.9 % IV SOLN
Freq: Once | INTRAVENOUS | Status: AC
  Administered 2014-06-30: 11:00:00 via INTRAVENOUS

## 2014-06-30 MED ORDER — METHYLPREDNISOLONE SODIUM SUCC 40 MG IJ SOLR
40.0000 mg | Freq: Once | INTRAMUSCULAR | Status: AC
  Administered 2014-06-30: 40 mg via INTRAVENOUS

## 2014-06-30 MED ORDER — PREDNISONE 20 MG PO TABS: 20.0000 mg | ORAL_TABLET | Freq: Every day | ORAL | Status: DC

## 2014-06-30 NOTE — Assessment & Plan Note (Signed)
This is likely anemia of chronic disease. The patient denies recent history of bleeding such as epistaxis, hematuria or hematochezia. She is asymptomatic from the anemia. We will observe for now.  She does not require transfusion now.   

## 2014-06-30 NOTE — Assessment & Plan Note (Signed)
Her blood sugar is a little high while on treatment. Hopefully this will improve back to her baseline after she completes all her treatment.

## 2014-06-30 NOTE — Assessment & Plan Note (Signed)
She had infusion reaction with the first cycle of treatment, resolved conservatively. She had no further reaction last week. We will proceed with the prescribed treatment for 4 cycles. I will order bloodwork and repeat PET/CT scan next month and see her back in 2 months for review of test results.

## 2014-06-30 NOTE — Progress Notes (Signed)
Critical Illness claim form from pt given to Carmelina Noun in managed care dept for completion.

## 2014-06-30 NOTE — Progress Notes (Signed)
[facility] OFFICE PROGRESS NOTE  Patient Care Team: Rogers Blocker, MD as PCP - General (Internal Medicine) Heath Lark, MD as Consulting Physician (Hematology and Oncology)  SUMMARY OF ONCOLOGIC HISTORY:   Splenic marginal zone b-cell lymphoma   01/11/2009 Initial Diagnosis Splenic marginal zone b-cell lymphoma   06/06/2014 Bone Marrow Biopsy Bone marrow aspirate and biopsy confirmed extensive involvement by CD20 positive non-Hodgkin's lymphoma.   06/07/2014 Imaging She had a PET scan which showed predominant splenic involvement.   06/16/2014 -  Chemotherapy She started on weekly rituximab.   06/16/2014 Adverse Reaction She had infusion reaction, resolved with IV dexamethasone.    INTERVAL HISTORY: Please see below for problem oriented charting. She is seen prior to cycle 3 of treatment. She denies reaction with cycle 2 of treatment. She complained of hives on her back. She also complained of mild palpitation.  REVIEW OF SYSTEMS:   Constitutional: Denies fevers, chills or abnormal weight loss Eyes: Denies blurriness of vision Ears, nose, mouth, throat, and face: Denies mucositis or sore throat Respiratory: Denies cough, dyspnea or wheezes Cardiovascular: Denies palpitation, chest discomfort or lower extremity swelling Gastrointestinal:  Denies nausea, heartburn or change in bowel habits Lymphatics: Denies new lymphadenopathy or easy bruising Neurological:Denies numbness, tingling or new weaknesses Behavioral/Psych: Mood is stable, no new changes  All other systems were reviewed with the patient and are negative.  I have reviewed the past medical history, past surgical history, social history and family history with the patient and they are unchanged from previous note.  ALLERGIES:  is allergic to erythromycin; lovastatin; and tolnaftate.  MEDICATIONS:  Current Outpatient Prescriptions  Medication Sig Dispense Refill  . allopurinol (ZYLOPRIM) 300 MG tablet Take 1 tablet  (300 mg total) by mouth daily.  30 tablet  0  . benazepril (LOTENSIN) 10 MG tablet Take 10 mg by mouth daily.       . fish oil-omega-3 fatty acids 1000 MG capsule Take 1 g by mouth 2 (two) times daily.       . furosemide (LASIX) 40 MG tablet Take 40 mg by mouth daily as needed (leg swelling).      . metFORMIN (GLUCOPHAGE) 1000 MG tablet Take 1,000 mg by mouth 2 (two) times daily with a meal.        . methocarbamol (ROBAXIN) 500 MG tablet Take 1 tablet (500 mg total) by mouth 3 (three) times daily between meals as needed.  20 tablet  0  . naproxen (NAPROSYN) 500 MG tablet Take 1 tablet (500 mg total) by mouth 2 (two) times daily.  30 tablet  0  . predniSONE (DELTASONE) 50 MG tablet Take 1 tablet (50 mg total) by mouth daily with breakfast.  3 tablet  0  . riTUXimab (RITUXAN) 10 MG/ML injection Inject into the vein once a week.      . valACYclovir (VALTREX) 500 MG tablet Take 500 mg by mouth 2 (two) times daily. Takes for outbreaks only.      . vitamin C (ASCORBIC ACID) 500 MG tablet Take 500 mg by mouth daily.       . predniSONE (DELTASONE) 20 MG tablet Take 1 tablet (20 mg total) by mouth daily with breakfast.  7 tablet  0   No current facility-administered medications for this visit.    PHYSICAL EXAMINATION: ECOG PERFORMANCE STATUS: 1 - Symptomatic but completely ambulatory  Filed Vitals:   06/30/14 1021  BP: 119/74  Pulse: 106  Temp: 98.4 F (36.9 C)  Resp: 18  Filed Weights   06/30/14 1021  Weight: 272 lb 8 oz (123.605 kg)    GENERAL:alert, no distress and comfortable. She is morbidly obese SKIN: Noted dermatitis/rash on the back. EYES: normal, Conjunctiva are pink and non-injected, sclera clear OROPHARYNX:no exudate, no erythema and lips, buccal mucosa, and tongue normal  NECK: supple, thyroid normal size, non-tender, without nodularity LYMPH:  no palpable lymphadenopathy in the cervical, axillary or inguinal LUNGS: clear to auscultation and percussion with normal breathing  effort HEART: regular rate & rhythm and no murmurs and no lower extremity edema ABDOMEN:abdomen soft, non-tender and normal bowel sounds. She has reduced splenic size Musculoskeletal:no cyanosis of digits and no clubbing  NEURO: alert & oriented x 3 with fluent speech, no focal motor/sensory deficits  LABORATORY DATA:  I have reviewed the data as listed    Component Value Date/Time   NA 134* 06/30/2014 1005   NA 140 03/26/2012 0902   K 4.2 06/30/2014 1005   K 4.1 03/26/2012 0902   CL 106 11/19/2012 0837   CL 107 03/26/2012 0902   CO2 20* 06/30/2014 1005   CO2 26 03/26/2012 0902   GLUCOSE 406* 06/30/2014 1005   GLUCOSE 107* 11/19/2012 0837   GLUCOSE 105* 03/26/2012 0902   BUN 9.7 06/30/2014 1005   BUN 10 03/26/2012 0902   CREATININE 0.8 06/30/2014 1005   CREATININE 0.63 03/26/2012 0902   CALCIUM 8.9 06/30/2014 1005   CALCIUM 8.8 03/26/2012 0902   PROT 6.3* 06/30/2014 1005   PROT 5.9* 03/26/2012 0902   ALBUMIN 3.4* 06/30/2014 1005   ALBUMIN 3.8 03/26/2012 0902   AST 14 06/30/2014 1005   AST 15 03/26/2012 0902   ALT 21 06/30/2014 1005   ALT 10 03/26/2012 0902   ALKPHOS 106 06/30/2014 1005   ALKPHOS 84 03/26/2012 0902   BILITOT 0.61 06/30/2014 1005   BILITOT 0.5 03/26/2012 0902   GFRNONAA >60 08/16/2007 0425   GFRAA  Value: >60        The eGFR has been calculated using the MDRD equation. This calculation has not been validated in all clinical 08/16/2007 0425    No results found for this basename: SPEP, UPEP,  kappa and lambda light chains    Lab Results  Component Value Date   WBC 5.8 06/30/2014   NEUTROABS 3.2 06/30/2014   HGB 10.0* 06/30/2014   HCT 32.7* 06/30/2014   MCV 71.1* 06/30/2014   PLT 183 06/30/2014      Chemistry      Component Value Date/Time   NA 134* 06/30/2014 1005   NA 140 03/26/2012 0902   K 4.2 06/30/2014 1005   K 4.1 03/26/2012 0902   CL 106 11/19/2012 0837   CL 107 03/26/2012 0902   CO2 20* 06/30/2014 1005   CO2 26 03/26/2012 0902   BUN 9.7 06/30/2014 1005   BUN 10 03/26/2012 0902    CREATININE 0.8 06/30/2014 1005   CREATININE 0.63 03/26/2012 0902      Component Value Date/Time   CALCIUM 8.9 06/30/2014 1005   CALCIUM 8.8 03/26/2012 0902   ALKPHOS 106 06/30/2014 1005   ALKPHOS 84 03/26/2012 0902   AST 14 06/30/2014 1005   AST 15 03/26/2012 0902   ALT 21 06/30/2014 1005   ALT 10 03/26/2012 0902   BILITOT 0.61 06/30/2014 1005   BILITOT 0.5 03/26/2012 0902     ASSESSMENT & PLAN:  Splenic marginal zone b-cell lymphoma She had infusion reaction with the first cycle of treatment, resolved conservatively. She had no further reaction  last week. We will proceed with the prescribed treatment for 4 cycles. I will order bloodwork and repeat PET/CT scan next month and see her back in 2 months for review of test results.  Iron deficiency anemia due to chronic blood loss This is likely anemia of chronic disease. The patient denies recent history of bleeding such as epistaxis, hematuria or hematochezia. She is asymptomatic from the anemia. We will observe for now.  She does not require transfusion now.    Palpitation This could be related to stress response from recent treatment. I will recheck thyroid function tests and in her next visit.   Acne She had a slight skin rash which resemble possible fungal infection versus dermatitis. It is itchy. This is not consistent with drug reaction. However, I recommended she discontinue allopurinol. Gave her a small prescription of prednisone in case her skin rash gets worse.   DM Her blood sugar is a little high while on treatment. Hopefully this will improve back to her baseline after she completes all her treatment.   Orders Placed This Encounter  Procedures  . NM PET Image Restag (PS) Skull Base To Thigh    Standing Status: Future     Number of Occurrences:      Standing Expiration Date: 08/30/2015    Order Specific Question:  Reason for Exam (SYMPTOM  OR DIAGNOSIS REQUIRED)    Answer:  lymphoma, assess response to Rx    Order  Specific Question:  Is the patient pregnant?    Answer:  No    Order Specific Question:  Preferred imaging location?    Answer:  [facility]  . TSH    Standing Status: Future     Number of Occurrences:      Standing Expiration Date: 08/04/2015   All questions were answered. The patient knows to call the clinic with any problems, questions or concerns. No barriers to learning was detected. I spent 30 minutes counseling the patient face to face. The total time spent in the appointment was 40 minutes and more than 50% was on counseling and review of test results     [facility], [place], MD 06/30/2014 4:35 PM

## 2014-06-30 NOTE — Assessment & Plan Note (Signed)
She had a slight skin rash which resemble possible fungal infection versus dermatitis. It is itchy. This is not consistent with drug reaction. However, I recommended she discontinue allopurinol. Gave her a small prescription of prednisone in case her skin rash gets worse.

## 2014-06-30 NOTE — Patient Instructions (Signed)
[place] Discharge Instructions for Patients Receiving Chemotherapy  Today you received the following chemotherapy agents Rituxan.  To help prevent nausea and vomiting after your treatment, we encourage you to take your nausea medication (None Ordered) call MD office if needed.   If you develop nausea and vomiting that is not controlled by your nausea medication, call the clinic.   BELOW ARE SYMPTOMS THAT SHOULD BE REPORTED IMMEDIATELY:  *FEVER GREATER THAN 100.5 F  *CHILLS WITH OR WITHOUT FEVER  NAUSEA AND VOMITING THAT IS NOT CONTROLLED WITH YOUR NAUSEA MEDICATION  *UNUSUAL SHORTNESS OF BREATH  *UNUSUAL BRUISING OR BLEEDING  TENDERNESS IN MOUTH AND THROAT WITH OR WITHOUT PRESENCE OF ULCERS  *URINARY PROBLEMS  *BOWEL PROBLEMS  UNUSUAL RASH Items with * indicate a potential emergency and should be followed up as soon as possible.  Feel free to call the clinic you have any questions or concerns. The clinic phone number is [phone].

## 2014-06-30 NOTE — Assessment & Plan Note (Signed)
This could be related to stress response from recent treatment. I will recheck thyroid function tests and in her next visit.

## 2014-06-30 NOTE — Telephone Encounter (Signed)
gv and printed appt sched and avs for pt for Sept and Dec

## 2014-07-03 ENCOUNTER — Encounter: Payer: Self-pay | Admitting: Hematology and Oncology

## 2014-07-03 ENCOUNTER — Encounter: Payer: Self-pay | Admitting: Nurse Practitioner

## 2014-07-03 DIAGNOSIS — R000 Tachycardia, unspecified: Secondary | ICD-10-CM | POA: Insufficient documentation

## 2014-07-03 NOTE — Progress Notes (Signed)
Put [employer] cancer form on nurse's desk

## 2014-07-03 NOTE — Assessment & Plan Note (Signed)
Patient's heart rate didn't increase from baseline between 101 110; up to maximum of 126.  EKG revealed a sinus tach cardiac with a ventricular rate of 126.  QTC was 457.  Patient's heart rate did return to baseline a few minutes later.  Patient was advised to call or go directly to the emergency department if she develops any specific palpitations were associated symptoms.

## 2014-07-03 NOTE — Assessment & Plan Note (Signed)
Patient was midst of receiving cycle 2 of her rituximab therapy regimen.  Patient typically is slightly tachycardic as her baseline; but her heart rate did increase to maximum 126 when vitals were checked.  Patient denied any other symptoms whatsoever.  Patient will proceed next week for cycle 3 of the same chemotherapy regimen.

## 2014-07-03 NOTE — Progress Notes (Signed)
[name]   Chief Complaint  Patient presents with  . 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  Currently undergoing Rituxan chemotherapy regimen.  Patient was in the midst of receiving cycle 2 of her Rituxan chemotherapy regimen; and developed a slight increase in her typical Tachycardia.  Patient's baseline heart rate is between 101 110.  Patient's heart rate increased to maximum 126.  Patient denied any chest pain, chest pressure, or shortness of breath with this increased her rate.  She also denies any other symptoms whatsoever.  She denies any recent fevers or chills.  EKG was obtained revealing a rate of 126 and a sinus tachycardia.  Patient's heart rate did very quickly returned to her baseline of 106-110.  Patient was able to continue with her rituximab chemotherapy regimen.   HPI ROS  Past Medical History  Diagnosis Date  . Leukocytosis 07/27/2011  . Hypertension   . Iron deficiency anemia due to chronic blood loss   . Fatigue 11/17/2013  . 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 Diabetes mellitus without complication   . 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 Non Hodgkin's lymphoma     recieving chemo 06/2014  . Palpitation 06/30/2014    History reviewed. No pertinent past surgical history.  has HSV; DM; OTHER DRUG ALLERGY; Leukocytosis; Splenic marginal zone b-cell lymphoma; Hypertension; Iron deficiency anemia due to chronic blood loss; Fatigue; Anemia in neoplastic disease; Hypersensitivity reaction; Palpitation; Acne; and Tachycardia on her problem list.     is allergic to erythromycin; lovastatin; and tolnaftate.    Medication List       This list is accurate as of: 06/23/14 11:59 PM.  Always use your most recent med list.               allopurinol 300 MG tablet  Commonly known as:  ZYLOPRIM  Take 1 tablet (300 mg total) by mouth daily.       benazepril 10 MG tablet  Commonly known as:  LOTENSIN  Take 10 mg by mouth daily.     fish oil-omega-3 fatty acids 1000 MG capsule  Take 1 g by mouth 2 (two) times daily.     furosemide 40 MG tablet  Commonly known as:  LASIX  Take 40 mg by mouth daily as needed (leg swelling).     metFORMIN 1000 MG tablet  Commonly known as:  GLUCOPHAGE  Take 1,000 mg by mouth 2 (two) times daily with a meal.     methocarbamol 500 MG tablet  Commonly known as:  ROBAXIN  Take 1 tablet (500 mg total) by mouth 3 (three) times daily between meals as needed.     naproxen 500 MG tablet  Commonly known as:  NAPROSYN  Take 1 tablet (500 mg total) by mouth 2 (two) times daily.     predniSONE 50 MG tablet  Commonly known as:  DELTASONE  Take 1 tablet (50 mg total) by mouth daily with breakfast.     riTUXimab 10 MG/ML injection  Commonly known as:  RITUXAN  Inject into the vein once a week.     valACYclovir 500 MG tablet  Commonly known as:  VALTREX  Take 500 mg by mouth 2 (two) times daily. Takes for outbreaks only.     vitamin C 500 MG tablet  Commonly known as:  ASCORBIC ACID  Take 500 mg by mouth daily.  PHYSICAL EXAMINATION  Blood pressure , last menstrual period 06/17/2014.  Vitals: BP 127/69, HR 110, temp 97.4, 98 %   Physical Exam  LABORATORY DATA:. CBC  Lab Results  Component Value Date   WBC 5.8 06/30/2014   RBC 4.60 06/30/2014   HGB 10.0* 06/30/2014   HCT 32.7* 06/30/2014   PLT 183 06/30/2014   MCV 71.1* 06/30/2014   MCH 21.7* 06/30/2014   MCHC 30.5* 06/30/2014   RDW 25.3* 06/30/2014   LYMPHSABS 1.9 06/30/2014   MONOABS 0.4 06/30/2014   EOSABS 0.1 06/30/2014   BASOSABS 0.1 06/30/2014     CMET  Lab Results  Component Value Date   NA 134* 06/30/2014   K 4.2 06/30/2014   CL 106 11/19/2012   CO2 20* 06/30/2014   GLUCOSE 406* 06/30/2014   BUN 9.7 06/30/2014   CREATININE 0.8 06/30/2014   CALCIUM 8.9 06/30/2014   PROT 6.3* 06/30/2014   ALBUMIN 3.4* 06/30/2014   AST 14  06/30/2014   ALT 21 06/30/2014   ALKPHOS 106 06/30/2014   BILITOT 0.61 06/30/2014   GFRNONAA >60 08/16/2007   GFRAA  Value: >60        The eGFR has been calculated using the MDRD equation. This calculation has not been validated in all clinical 08/16/2007    ASSESSMENT/PLAN:    Splenic marginal zone b-cell lymphoma  Assessment & Plan Patient was midst of receiving cycle 2 of her rituximab therapy regimen.  Patient typically is slightly tachycardic as her baseline; but her heart rate did increase to maximum 126 when vitals were checked.  Patient denied any other symptoms whatsoever.  Patient will proceed next week for cycle 3 of the same chemotherapy regimen.   Tachycardia  Assessment & Plan Patient's heart rate didn't increase from baseline between 101 110; up to maximum of 126.  EKG revealed a sinus tach cardiac with a ventricular rate of 126.  QTC was 457.  Patient's heart rate did return to baseline a few minutes later.  Patient was advised to call or go directly to the emergency department if she develops any specific palpitations were associated symptoms.   Patient stated understanding of all instructions; and was in agreement with this plan of care. The patient knows to call the clinic with any problems, questions or concerns.   Review/collaboration with Dr. Alvy Bimler regarding all aspects of patient's visit today.   Total time spent with patient was 15  minutes;  with greater than  90  percent of that time spent in face to face counseling regarding  her symptoms, review of EKG results,  and coordination of care and follow up.  Disclaimer: This note was dictated with voice recognition software. Similar sounding words can inadvertently be transcribed and may not be corrected upon review.   Drue Second, NP 07/03/2014

## 2014-07-06 ENCOUNTER — Encounter: Payer: Self-pay | Admitting: Hematology and Oncology

## 2014-07-06 NOTE — Progress Notes (Signed)
Put [place] form in Pharmacist, hospital

## 2014-07-07 ENCOUNTER — Ambulatory Visit (HOSPITAL_BASED_OUTPATIENT_CLINIC_OR_DEPARTMENT_OTHER): Payer: BC Managed Care – PPO

## 2014-07-07 ENCOUNTER — Other Ambulatory Visit (HOSPITAL_BASED_OUTPATIENT_CLINIC_OR_DEPARTMENT_OTHER): Payer: BC Managed Care – PPO

## 2014-07-07 VITALS — BP 137/70 | HR 113 | Temp 97.4°F | Resp 18

## 2014-07-07 DIAGNOSIS — R002 Palpitations: Secondary | ICD-10-CM

## 2014-07-07 DIAGNOSIS — E119 Type 2 diabetes mellitus without complications: Secondary | ICD-10-CM

## 2014-07-07 DIAGNOSIS — Z5112 Encounter for antineoplastic immunotherapy: Secondary | ICD-10-CM

## 2014-07-07 DIAGNOSIS — C8307 Small cell B-cell lymphoma, spleen: Secondary | ICD-10-CM

## 2014-07-07 DIAGNOSIS — D500 Iron deficiency anemia secondary to blood loss (chronic): Secondary | ICD-10-CM

## 2014-07-07 DIAGNOSIS — Z23 Encounter for immunization: Secondary | ICD-10-CM

## 2014-07-07 DIAGNOSIS — L708 Other acne: Secondary | ICD-10-CM

## 2014-07-07 LAB — CBC WITH DIFFERENTIAL/PLATELET
BASO%: 1.1 % (ref 0.0–2.0)
Basophils Absolute: 0.1 10*3/uL (ref 0.0–0.1)
EOS%: 1 % (ref 0.0–7.0)
Eosinophils Absolute: 0.1 10*3/uL (ref 0.0–0.5)
HCT: 37.3 % (ref 34.8–46.6)
HEMOGLOBIN: 11.7 g/dL (ref 11.6–15.9)
LYMPH%: 35.2 % (ref 14.0–49.7)
MCH: 23.1 pg — AB (ref 25.1–34.0)
MCHC: 31.3 g/dL — ABNORMAL LOW (ref 31.5–36.0)
MCV: 73.8 fL — AB (ref 79.5–101.0)
MONO#: 0.3 10*3/uL (ref 0.1–0.9)
MONO%: 5.4 % (ref 0.0–14.0)
NEUT#: 3 10*3/uL (ref 1.5–6.5)
NEUT%: 57.3 % (ref 38.4–76.8)
Platelets: 213 10*3/uL (ref 145–400)
RBC: 5.05 10*6/uL (ref 3.70–5.45)
RDW: 26.2 % — ABNORMAL HIGH (ref 11.2–14.5)
WBC: 5.1 10*3/uL (ref 3.9–10.3)
lymph#: 1.8 10*3/uL (ref 0.9–3.3)

## 2014-07-07 LAB — COMPREHENSIVE METABOLIC PANEL (CC13)
ALBUMIN: 3.6 g/dL (ref 3.5–5.0)
ALK PHOS: 120 U/L (ref 40–150)
ALT: 28 U/L (ref 0–55)
AST: 17 U/L (ref 5–34)
Anion Gap: 12 mEq/L — ABNORMAL HIGH (ref 3–11)
BUN: 8 mg/dL (ref 7.0–26.0)
CALCIUM: 9.2 mg/dL (ref 8.4–10.4)
CHLORIDE: 105 meq/L (ref 98–109)
CO2: 22 mEq/L (ref 22–29)
Creatinine: 0.8 mg/dL (ref 0.6–1.1)
Glucose: 426 mg/dl — ABNORMAL HIGH (ref 70–140)
Potassium: 4.4 mEq/L (ref 3.5–5.1)
Sodium: 138 mEq/L (ref 136–145)
Total Bilirubin: 0.68 mg/dL (ref 0.20–1.20)
Total Protein: 6.5 g/dL (ref 6.4–8.3)

## 2014-07-07 LAB — LACTATE DEHYDROGENASE (CC13): LDH: 222 U/L (ref 125–245)

## 2014-07-07 LAB — TSH CHCC: TSH: 2.401 m[IU]/L (ref 0.308–3.960)

## 2014-07-07 LAB — URIC ACID (CC13): Uric Acid, Serum: 4.3 mg/dl (ref 2.6–7.4)

## 2014-07-07 MED ORDER — METHYLPREDNISOLONE SODIUM SUCC 40 MG IJ SOLR
INTRAMUSCULAR | Status: AC
  Filled 2014-07-07: qty 1

## 2014-07-07 MED ORDER — SODIUM CHLORIDE 0.9 % IV SOLN
375.0000 mg/m2 | Freq: Once | INTRAVENOUS | Status: AC
  Administered 2014-07-07: 900 mg via INTRAVENOUS
  Filled 2014-07-07: qty 90

## 2014-07-07 MED ORDER — DIPHENHYDRAMINE HCL 25 MG PO CAPS
ORAL_CAPSULE | ORAL | Status: AC
  Filled 2014-07-07: qty 2

## 2014-07-07 MED ORDER — DIPHENHYDRAMINE HCL 25 MG PO CAPS
50.0000 mg | ORAL_CAPSULE | Freq: Once | ORAL | Status: AC
  Administered 2014-07-07: 50 mg via ORAL

## 2014-07-07 MED ORDER — SODIUM CHLORIDE 0.9 % IV SOLN
Freq: Once | INTRAVENOUS | Status: AC
  Administered 2014-07-07: 09:00:00 via INTRAVENOUS

## 2014-07-07 MED ORDER — ACETAMINOPHEN 325 MG PO TABS
650.0000 mg | ORAL_TABLET | Freq: Once | ORAL | Status: AC
  Administered 2014-07-07: 650 mg via ORAL

## 2014-07-07 MED ORDER — METHYLPREDNISOLONE SODIUM SUCC 40 MG IJ SOLR
40.0000 mg | Freq: Once | INTRAMUSCULAR | Status: AC
  Administered 2014-07-07: 40 mg via INTRAVENOUS

## 2014-07-07 MED ORDER — ACETAMINOPHEN 325 MG PO TABS
ORAL_TABLET | ORAL | Status: AC
Start: 2014-07-07 — End: 2014-07-07
  Filled 2014-07-07: qty 2

## 2014-07-07 NOTE — Patient Instructions (Signed)
[facility] Discharge Instructions for Patients Receiving Chemotherapy  Today you received the following chemotherapy agents: Rituxan. To help prevent nausea and vomiting after your treatment, we encourage you to take your nausea medication.   If you develop nausea and vomiting that is not controlled by your nausea medication, call the clinic.   BELOW ARE SYMPTOMS THAT SHOULD BE REPORTED IMMEDIATELY:  *FEVER GREATER THAN 100.5 F  *CHILLS WITH OR WITHOUT FEVER  NAUSEA AND VOMITING THAT IS NOT CONTROLLED WITH YOUR NAUSEA MEDICATION  *UNUSUAL SHORTNESS OF BREATH  *UNUSUAL BRUISING OR BLEEDING  TENDERNESS IN MOUTH AND THROAT WITH OR WITHOUT PRESENCE OF ULCERS  *URINARY PROBLEMS  *BOWEL PROBLEMS  UNUSUAL RASH Items with * indicate a potential emergency and should be followed up as soon as possible.  Feel free to call the clinic you have any questions or concerns. The clinic phone number is [phone].    

## 2014-07-19 ENCOUNTER — Ambulatory Visit: Payer: Self-pay | Admitting: Hematology and Oncology

## 2014-07-19 ENCOUNTER — Other Ambulatory Visit: Payer: Self-pay

## 2014-08-18 ENCOUNTER — Ambulatory Visit (HOSPITAL_COMMUNITY)
Admission: RE | Admit: 2014-08-18 | Discharge: 2014-08-18 | Disposition: A | Discharge status: Home / Self Care | Payer: BC Managed Care – PPO | Source: Ambulatory Visit | Attending: Hematology and Oncology | Admitting: Hematology and Oncology

## 2014-08-18 ENCOUNTER — Other Ambulatory Visit (HOSPITAL_BASED_OUTPATIENT_CLINIC_OR_DEPARTMENT_OTHER): Payer: BC Managed Care – PPO

## 2014-08-18 DIAGNOSIS — D509 Iron deficiency anemia, unspecified: Secondary | ICD-10-CM

## 2014-08-18 DIAGNOSIS — R161 Splenomegaly, not elsewhere classified: Secondary | ICD-10-CM | POA: Insufficient documentation

## 2014-08-18 DIAGNOSIS — C8587 Other specified types of non-Hodgkin lymphoma, spleen: Secondary | ICD-10-CM | POA: Insufficient documentation

## 2014-08-18 DIAGNOSIS — E119 Type 2 diabetes mellitus without complications: Secondary | ICD-10-CM

## 2014-08-18 DIAGNOSIS — C8307 Small cell B-cell lymphoma, spleen: Secondary | ICD-10-CM

## 2014-08-18 DIAGNOSIS — D500 Iron deficiency anemia secondary to blood loss (chronic): Secondary | ICD-10-CM | POA: Diagnosis not present

## 2014-08-18 DIAGNOSIS — R002 Palpitations: Secondary | ICD-10-CM | POA: Insufficient documentation

## 2014-08-18 DIAGNOSIS — K760 Fatty (change of) liver, not elsewhere classified: Secondary | ICD-10-CM | POA: Insufficient documentation

## 2014-08-18 DIAGNOSIS — L708 Other acne: Secondary | ICD-10-CM

## 2014-08-18 DIAGNOSIS — R911 Solitary pulmonary nodule: Secondary | ICD-10-CM | POA: Diagnosis not present

## 2014-08-18 LAB — IRON AND TIBC CHCC
%SAT: 12 % — AB (ref 21–57)
IRON: 35 ug/dL — AB (ref 41–142)
TIBC: 281 ug/dL (ref 236–444)
UIBC: 246 ug/dL (ref 120–384)

## 2014-08-18 LAB — CBC WITH DIFFERENTIAL/PLATELET
BASO%: 0.9 % (ref 0.0–2.0)
BASOS ABS: 0.1 10*3/uL (ref 0.0–0.1)
EOS%: 0.8 % (ref 0.0–7.0)
Eosinophils Absolute: 0.1 10*3/uL (ref 0.0–0.5)
HEMATOCRIT: 40.7 % (ref 34.8–46.6)
HEMOGLOBIN: 13.1 g/dL (ref 11.6–15.9)
LYMPH%: 26.5 % (ref 14.0–49.7)
MCH: 24.7 pg — ABNORMAL LOW (ref 25.1–34.0)
MCHC: 32.1 g/dL (ref 31.5–36.0)
MCV: 76.9 fL — ABNORMAL LOW (ref 79.5–101.0)
MONO#: 0.3 10*3/uL (ref 0.1–0.9)
MONO%: 5.1 % (ref 0.0–14.0)
NEUT#: 4.5 10*3/uL (ref 1.5–6.5)
NEUT%: 66.7 % (ref 38.4–76.8)
Platelets: 200 10*3/uL (ref 145–400)
RBC: 5.29 10*6/uL (ref 3.70–5.45)
RDW: 17.7 % — ABNORMAL HIGH (ref 11.2–14.5)
WBC: 6.7 10*3/uL (ref 3.9–10.3)
lymph#: 1.8 10*3/uL (ref 0.9–3.3)

## 2014-08-18 LAB — COMPREHENSIVE METABOLIC PANEL (CC13)
ALK PHOS: 78 U/L (ref 40–150)
ALT: 22 U/L (ref 0–55)
AST: 14 U/L (ref 5–34)
Albumin: 3.7 g/dL (ref 3.5–5.0)
Anion Gap: 10 mEq/L (ref 3–11)
BILIRUBIN TOTAL: 0.41 mg/dL (ref 0.20–1.20)
BUN: 9.9 mg/dL (ref 7.0–26.0)
CO2: 26 meq/L (ref 22–29)
CREATININE: 0.8 mg/dL (ref 0.6–1.1)
Calcium: 8.9 mg/dL (ref 8.4–10.4)
Chloride: 105 mEq/L (ref 98–109)
GLUCOSE: 196 mg/dL — AB (ref 70–140)
Potassium: 4.3 mEq/L (ref 3.5–5.1)
Sodium: 140 mEq/L (ref 136–145)
Total Protein: 6.3 g/dL — ABNORMAL LOW (ref 6.4–8.3)

## 2014-08-18 LAB — GLUCOSE, CAPILLARY: Glucose-Capillary: 152 mg/dL — ABNORMAL HIGH (ref 70–99)

## 2014-08-18 LAB — FERRITIN CHCC: Ferritin: 44 ng/ml (ref 9–269)

## 2014-08-18 MED ORDER — FLUDEOXYGLUCOSE F - 18 (FDG) INJECTION
13.6300 | Freq: Once | INTRAVENOUS | Status: AC | PRN
  Administered 2014-08-18: 13.63 via INTRAVENOUS

## 2014-08-24 ENCOUNTER — Telehealth: Payer: Self-pay | Admitting: Hematology and Oncology

## 2014-08-24 ENCOUNTER — Ambulatory Visit (HOSPITAL_BASED_OUTPATIENT_CLINIC_OR_DEPARTMENT_OTHER): Payer: BC Managed Care – PPO | Admitting: Hematology and Oncology

## 2014-08-24 VITALS — BP 122/78 | HR 108 | Temp 98.3°F | Resp 19 | Ht 67.5 in | Wt 281.8 lb

## 2014-08-24 DIAGNOSIS — C8307 Small cell B-cell lymphoma, spleen: Secondary | ICD-10-CM

## 2014-08-24 DIAGNOSIS — K760 Fatty (change of) liver, not elsewhere classified: Secondary | ICD-10-CM | POA: Insufficient documentation

## 2014-08-24 DIAGNOSIS — D500 Iron deficiency anemia secondary to blood loss (chronic): Secondary | ICD-10-CM

## 2014-08-24 DIAGNOSIS — C8517 Unspecified B-cell lymphoma, spleen: Secondary | ICD-10-CM

## 2014-08-24 NOTE — Progress Notes (Signed)
[place] OFFICE PROGRESS NOTE  Patient Care Team: Rogers Blocker, MD as PCP - General (Internal Medicine) Heath Lark, MD as Consulting Physician (Hematology and Oncology)  SUMMARY OF ONCOLOGIC HISTORY:   Splenic marginal zone b-cell lymphoma   01/11/2009 Initial Diagnosis Splenic marginal zone b-cell lymphoma   06/06/2014 Bone Marrow Biopsy Bone marrow aspirate and biopsy confirmed extensive involvement by CD20 positive non-Hodgkin's lymphoma.   06/07/2014 Imaging She had a PET scan which showed predominant splenic involvement.   06/16/2014 - 07/07/2014 Chemotherapy She started on weekly rituximab.   06/16/2014 Adverse Reaction She had infusion reaction, resolved with IV dexamethasone.   08/18/2014 Imaging PET CT scan show resolution of hypermetabolic activity.    INTERVAL HISTORY: Please see below for problem oriented charting. She feels well. Energy level has improved. She returns to review test results of PET scan.  REVIEW OF SYSTEMS:   Constitutional: Denies fevers, chills or abnormal weight loss Eyes: Denies blurriness of vision Ears, nose, mouth, throat, and face: Denies mucositis or sore throat Respiratory: Denies cough, dyspnea or wheezes Cardiovascular: Denies palpitation, chest discomfort or lower extremity swelling Gastrointestinal:  Denies nausea, heartburn or change in bowel habits Skin: Denies abnormal skin rashes Lymphatics: Denies new lymphadenopathy or easy bruising Neurological:Denies numbness, tingling or new weaknesses Behavioral/Psych: Mood is stable, no new changes  All other systems were reviewed with the patient and are negative.  I have reviewed the past medical history, past surgical history, social history and family history with the patient and they are unchanged from previous note.  ALLERGIES:  is allergic to erythromycin; lovastatin; and tolnaftate.  MEDICATIONS:  Current Outpatient Prescriptions  Medication Sig Dispense Refill  .  benazepril (LOTENSIN) 10 MG tablet Take 10 mg by mouth daily.     . fish oil-omega-3 fatty acids 1000 MG capsule Take 1 g by mouth 2 (two) times daily.     . furosemide (LASIX) 40 MG tablet Take 40 mg by mouth daily as needed (leg swelling).    . metFORMIN (GLUCOPHAGE) 1000 MG tablet Take 1,000 mg by mouth 2 (two) times daily with a meal.      . valACYclovir (VALTREX) 500 MG tablet Take 500 mg by mouth 2 (two) times daily. Takes for outbreaks only.     No current facility-administered medications for this visit.    PHYSICAL EXAMINATION: ECOG PERFORMANCE STATUS: 0 - Asymptomatic  Filed Vitals:   08/24/14 0954  BP: 122/78  Pulse: 108  Temp: 98.3 F (36.8 C)  Resp: 19   Filed Weights   08/24/14 0954  Weight: 281 lb 12.8 oz (127.824 kg)    GENERAL:alert, no distress and comfortable. She is morbidly obese SKIN: skin color, texture, turgor are normal, no rashes or significant lesions EYES: normal, Conjunctiva are pink and non-injected, sclera clear OROPHARYNX:no exudate, no erythema and lips, buccal mucosa, and tongue normal  Musculoskeletal:no cyanosis of digits and no clubbing  NEURO: alert & oriented x 3 with fluent speech, no focal motor/sensory deficits  LABORATORY DATA:  I have reviewed the data as listed    Component Value Date/Time   NA 140 08/18/2014 0804   NA 140 03/26/2012 0902   K 4.3 08/18/2014 0804   K 4.1 03/26/2012 0902   CL 106 11/19/2012 0837   CL 107 03/26/2012 0902   CO2 26 08/18/2014 0804   CO2 26 03/26/2012 0902   GLUCOSE 196* 08/18/2014 0804   GLUCOSE 107* 11/19/2012 0837   GLUCOSE 105* 03/26/2012 0902   BUN  9.9 08/18/2014 0804   BUN 10 03/26/2012 0902   CREATININE 0.8 08/18/2014 0804   CREATININE 0.63 03/26/2012 0902   CALCIUM 8.9 08/18/2014 0804   CALCIUM 8.8 03/26/2012 0902   PROT 6.3* 08/18/2014 0804   PROT 5.9* 03/26/2012 0902   ALBUMIN 3.7 08/18/2014 0804   ALBUMIN 3.8 03/26/2012 0902   AST 14 08/18/2014 0804   AST 15 03/26/2012 0902    ALT 22 08/18/2014 0804   ALT 10 03/26/2012 0902   ALKPHOS 78 08/18/2014 0804   ALKPHOS 84 03/26/2012 0902   BILITOT 0.41 08/18/2014 0804   BILITOT 0.5 03/26/2012 0902   GFRNONAA >60 08/16/2007 0425   GFRAA  08/16/2007 0425    >60        The eGFR has been calculated using the MDRD equation. This calculation has not been validated in all clinical    No results found for: SPEP, UPEP  Lab Results  Component Value Date   WBC 6.7 08/18/2014   NEUTROABS 4.5 08/18/2014   HGB 13.1 08/18/2014   HCT 40.7 08/18/2014   MCV 76.9* 08/18/2014   PLT 200 08/18/2014      Chemistry      Component Value Date/Time   NA 140 08/18/2014 0804   NA 140 03/26/2012 0902   K 4.3 08/18/2014 0804   K 4.1 03/26/2012 0902   CL 106 11/19/2012 0837   CL 107 03/26/2012 0902   CO2 26 08/18/2014 0804   CO2 26 03/26/2012 0902   BUN 9.9 08/18/2014 0804   BUN 10 03/26/2012 0902   CREATININE 0.8 08/18/2014 0804   CREATININE 0.63 03/26/2012 0902      Component Value Date/Time   CALCIUM 8.9 08/18/2014 0804   CALCIUM 8.8 03/26/2012 0902   ALKPHOS 78 08/18/2014 0804   ALKPHOS 84 03/26/2012 0902   AST 14 08/18/2014 0804   AST 15 03/26/2012 0902   ALT 22 08/18/2014 0804   ALT 10 03/26/2012 0902   BILITOT 0.41 08/18/2014 0804   BILITOT 0.5 03/26/2012 0902       RADIOGRAPHIC STUDIES:I reviewed her most recent PET scan. I have personally reviewed the radiological images as listed and agreed with the findings in the report.  ASSESSMENT & PLAN:  Splenic marginal zone b-cell lymphoma Overall, she responded well to treatment. She has no evidence of recurrence of hemolytic anemia. I shared with her data regarding maintenance rituximab. We have made an informed decision not to pursue further treatment and just go on observation only.  Iron deficiency anemia due to chronic blood loss This has markedly improved. I recommended she resume oral iron supplement for a few more months.  Fatty liver disease,  nonalcoholic She is morbidly obese and has evidence of fatty liver disease. I recommend dietary modification and exercise along with weight loss.   Orders Placed This Encounter  Procedures  . Ferritin    Standing Status: Future     Number of Occurrences:      Standing Expiration Date: 09/28/2015  . Lactate dehydrogenase    Standing Status: Future     Number of Occurrences:      Standing Expiration Date: 09/28/2015   All questions were answered. The patient knows to call the clinic with any problems, questions or concerns. No barriers to learning was detected. I spent 25 minutes counseling the patient face to face. The total time spent in the appointment was 30 minutes and more than 50% was on counseling and review of test results  [place], [place], MD 08/24/2014 10:25 AM

## 2014-08-24 NOTE — Telephone Encounter (Signed)
gv and printed appt sched and avs for pt for Feb 2016 °

## 2014-08-24 NOTE — Assessment & Plan Note (Signed)
Overall, she responded well to treatment. She has no evidence of recurrence of hemolytic anemia. I shared with her data regarding maintenance rituximab. We have made an informed decision not to pursue further treatment and just go on observation only.

## 2014-08-24 NOTE — Assessment & Plan Note (Signed)
This has markedly improved. I recommended she resume oral iron supplement for a few more months.

## 2014-08-24 NOTE — Assessment & Plan Note (Signed)
She is morbidly obese and has evidence of fatty liver disease. I recommend dietary modification and exercise along with weight loss.

## 2014-09-19 ENCOUNTER — Other Ambulatory Visit: Payer: Self-pay | Admitting: Hematology and Oncology

## 2014-10-20 ENCOUNTER — Other Ambulatory Visit: Payer: Self-pay

## 2014-10-20 DIAGNOSIS — Z1231 Encounter for screening mammogram for malignant neoplasm of breast: Secondary | ICD-10-CM

## 2014-10-23 ENCOUNTER — Telehealth: Payer: Self-pay | Admitting: Hematology and Oncology

## 2014-10-23 NOTE — Telephone Encounter (Signed)
s.w pt and advised on 2.12.16 appt moved to 2.17 due to MD on pal..Marland KitchenMarland KitchenMarland Kitchenpt ok and aware

## 2014-10-31 ENCOUNTER — Ambulatory Visit
Admission: RE | Admit: 2014-10-31 | Discharge: 2014-10-31 | Disposition: A | Discharge status: Home / Self Care | Payer: BLUE CROSS/BLUE SHIELD | Source: Ambulatory Visit

## 2014-10-31 DIAGNOSIS — Z1231 Encounter for screening mammogram for malignant neoplasm of breast: Secondary | ICD-10-CM

## 2014-11-24 ENCOUNTER — Other Ambulatory Visit: Payer: Self-pay

## 2014-11-24 ENCOUNTER — Ambulatory Visit: Payer: Self-pay | Admitting: Hematology and Oncology

## 2014-11-29 ENCOUNTER — Ambulatory Visit (HOSPITAL_BASED_OUTPATIENT_CLINIC_OR_DEPARTMENT_OTHER): Payer: BLUE CROSS/BLUE SHIELD | Admitting: Hematology and Oncology

## 2014-11-29 ENCOUNTER — Other Ambulatory Visit (HOSPITAL_BASED_OUTPATIENT_CLINIC_OR_DEPARTMENT_OTHER): Payer: BLUE CROSS/BLUE SHIELD

## 2014-11-29 ENCOUNTER — Encounter: Payer: Self-pay | Admitting: Hematology and Oncology

## 2014-11-29 ENCOUNTER — Telehealth: Payer: Self-pay | Admitting: Hematology and Oncology

## 2014-11-29 VITALS — BP 122/78 | HR 124 | Temp 98.0°F | Resp 18 | Ht 67.5 in | Wt 281.2 lb

## 2014-11-29 DIAGNOSIS — C8307 Small cell B-cell lymphoma, spleen: Secondary | ICD-10-CM

## 2014-11-29 DIAGNOSIS — D500 Iron deficiency anemia secondary to blood loss (chronic): Secondary | ICD-10-CM

## 2014-11-29 DIAGNOSIS — C709 Malignant neoplasm of meninges, unspecified: Secondary | ICD-10-CM

## 2014-11-29 DIAGNOSIS — C8587 Other specified types of non-Hodgkin lymphoma, spleen: Secondary | ICD-10-CM

## 2014-11-29 DIAGNOSIS — R000 Tachycardia, unspecified: Secondary | ICD-10-CM

## 2014-11-29 DIAGNOSIS — Z23 Encounter for immunization: Secondary | ICD-10-CM

## 2014-11-29 DIAGNOSIS — L708 Other acne: Secondary | ICD-10-CM

## 2014-11-29 HISTORY — DX: Tachycardia, unspecified: R00.0

## 2014-11-29 LAB — CBC WITH DIFFERENTIAL/PLATELET
BASO%: 0.3 % (ref 0.0–2.0)
Basophils Absolute: 0 10*3/uL (ref 0.0–0.1)
EOS%: 0.7 % (ref 0.0–7.0)
Eosinophils Absolute: 0.1 10*3/uL (ref 0.0–0.5)
HEMATOCRIT: 41.8 % (ref 34.8–46.6)
HGB: 14 g/dL (ref 11.6–15.9)
LYMPH%: 19.4 % (ref 14.0–49.7)
MCH: 25.7 pg (ref 25.1–34.0)
MCHC: 33.5 g/dL (ref 31.5–36.0)
MCV: 76.7 fL — ABNORMAL LOW (ref 79.5–101.0)
MONO#: 0.8 10*3/uL (ref 0.1–0.9)
MONO%: 6.8 % (ref 0.0–14.0)
NEUT%: 72.8 % (ref 38.4–76.8)
NEUTROS ABS: 8.4 10*3/uL — AB (ref 1.5–6.5)
Platelets: 229 10*3/uL (ref 145–400)
RBC: 5.45 10*6/uL (ref 3.70–5.45)
RDW: 15.1 % — ABNORMAL HIGH (ref 11.2–14.5)
WBC: 11.5 10*3/uL — ABNORMAL HIGH (ref 3.9–10.3)
lymph#: 2.2 10*3/uL (ref 0.9–3.3)

## 2014-11-29 LAB — COMPREHENSIVE METABOLIC PANEL (CC13)
ALT: 30 U/L (ref 0–55)
ANION GAP: 10 meq/L (ref 3–11)
AST: 22 U/L (ref 5–34)
Albumin: 3.6 g/dL (ref 3.5–5.0)
Alkaline Phosphatase: 109 U/L (ref 40–150)
BILIRUBIN TOTAL: 0.42 mg/dL (ref 0.20–1.20)
BUN: 11 mg/dL (ref 7.0–26.0)
CALCIUM: 9.1 mg/dL (ref 8.4–10.4)
CHLORIDE: 104 meq/L (ref 98–109)
CO2: 22 mEq/L (ref 22–29)
Creatinine: 0.8 mg/dL (ref 0.6–1.1)
Glucose: 325 mg/dl — ABNORMAL HIGH (ref 70–140)
Potassium: 4.1 mEq/L (ref 3.5–5.1)
Sodium: 136 mEq/L (ref 136–145)
TOTAL PROTEIN: 6.4 g/dL (ref 6.4–8.3)

## 2014-11-29 LAB — LACTATE DEHYDROGENASE (CC13): LDH: 180 U/L (ref 125–245)

## 2014-11-29 LAB — FERRITIN CHCC: Ferritin: 47 ng/ml (ref 9–269)

## 2014-11-29 MED ORDER — PNEUMOCOCCAL 13-VAL CONJ VACC IM SUSP
0.5000 mL | Freq: Once | INTRAMUSCULAR | Status: AC
  Administered 2014-11-29: 0.5 mL via INTRAMUSCULAR
  Filled 2014-11-29: qty 0.5

## 2014-11-29 NOTE — Assessment & Plan Note (Signed)
She has diffuse acneform rash and recurrent outbreak around her genital region. I am not sure what the cause of this rash. She is ready taking Valtrex. I recommend dermatology consultation and she agreed to proceed.

## 2014-11-29 NOTE — Assessment & Plan Note (Signed)
Overall, she responded well to treatment. She has no evidence of recurrence of hemolytic anemia. I shared with her data regarding maintenance rituximab. We have made an informed decision not to pursue further treatment and just go on observation only.  I will see her back in 6 months with a repeat history, physical examination and blood work. Due to risk of infection, I recommend pneumococcal vaccination and she agreed to proceed today.

## 2014-11-29 NOTE — Telephone Encounter (Signed)
Gave avs & calendar for August. See referral notes.

## 2014-11-29 NOTE — Progress Notes (Signed)
[place] OFFICE PROGRESS NOTE  Patient Care Team: Rogers Blocker, MD as PCP - General (Internal Medicine) Heath Lark, MD as Consulting Physician (Hematology and Oncology)  SUMMARY OF ONCOLOGIC HISTORY:   Splenic marginal zone b-cell lymphoma   01/11/2009 Initial Diagnosis Splenic marginal zone b-cell lymphoma   06/06/2014 Bone Marrow Biopsy Bone marrow aspirate and biopsy confirmed extensive involvement by CD20 positive non-Hodgkin's lymphoma.   06/07/2014 Imaging She had a PET scan which showed predominant splenic involvement.   06/16/2014 - 07/07/2014 Chemotherapy She started on weekly rituximab.   06/16/2014 Adverse Reaction She had infusion reaction, resolved with IV dexamethasone.   08/18/2014 Imaging PET CT scan show resolution of hypermetabolic activity.    INTERVAL HISTORY: Please see below for problem oriented charting.  she is seen as part of her routine follow-up. She denies new lymphadenopathy. She complained of chronic palpitation. She also have diffuse skin lesions throughout and recurrent breakout around the genital region. She denies other forms of infection.  REVIEW OF SYSTEMS:   Constitutional: Denies fevers, chills or abnormal weight loss Eyes: Denies blurriness of vision Ears, nose, mouth, throat, and face: Denies mucositis or sore throat Respiratory: Denies cough, dyspnea or wheezes Cardiovascular: Denies palpitation, chest discomfort or lower extremity swelling Gastrointestinal:  Denies nausea, heartburn or change in bowel habits Lymphatics: Denies new lymphadenopathy or easy bruising Neurological:Denies numbness, tingling or new weaknesses Behavioral/Psych: Mood is stable, no new changes  All other systems were reviewed with the patient and are negative.  I have reviewed the past medical history, past surgical history, social history and family history with the patient and they are unchanged from previous note.  ALLERGIES:  is allergic to  erythromycin; lovastatin; and tolnaftate.  MEDICATIONS:  Current Outpatient Prescriptions  Medication Sig Dispense Refill  . benazepril (LOTENSIN) 10 MG tablet Take 10 mg by mouth daily.     . fish oil-omega-3 fatty acids 1000 MG capsule Take 1 g by mouth 2 (two) times daily.     . furosemide (LASIX) 40 MG tablet Take 40 mg by mouth daily as needed (leg swelling).    . metFORMIN (GLUCOPHAGE) 1000 MG tablet Take 1,000 mg by mouth 2 (two) times daily with a meal.      . valACYclovir (VALTREX) 500 MG tablet Take 500 mg by mouth 2 (two) times daily. Takes for outbreaks only.     No current facility-administered medications for this visit.    PHYSICAL EXAMINATION: ECOG PERFORMANCE STATUS: 1 - Symptomatic but completely ambulatory  Filed Vitals:   11/29/14 0932  BP: 122/78  Pulse: 124  Temp: 98 F (36.7 C)  Resp: 18   Filed Weights   11/29/14 0932  Weight: 281 lb 3.2 oz (127.551 kg)    GENERAL:alert, no distress and comfortable. She is morbidly obese. SKIN:  She have diffuse rash over the skin of unknown etiology. EYES: normal, Conjunctiva are pink and non-injected, sclera clear OROPHARYNX:no exudate, no erythema and lips, buccal mucosa, and tongue normal  NECK: supple, thyroid normal size, non-tender, without nodularity LYMPH:  no palpable lymphadenopathy in the cervical, axillary or inguinal LUNGS: clear to auscultation and percussion with normal breathing effort HEART:  She has tachycardia, no murmur and mild bilateral leg edema. ABDOMEN:abdomen soft, non-tender and normal bowel sounds Musculoskeletal:no cyanosis of digits and no clubbing  NEURO: alert & oriented x 3 with fluent speech, no focal motor/sensory deficits  LABORATORY DATA:  I have reviewed the data as listed    Component Value Date/Time  NA 136 11/29/2014 0914   NA 140 03/26/2012 0902   K 4.1 11/29/2014 0914   K 4.1 03/26/2012 0902   CL 106 11/19/2012 0837   CL 107 03/26/2012 0902   CO2 22 11/29/2014  0914   CO2 26 03/26/2012 0902   GLUCOSE 325* 11/29/2014 0914   GLUCOSE 107* 11/19/2012 0837   GLUCOSE 105* 03/26/2012 0902   BUN 11.0 11/29/2014 0914   BUN 10 03/26/2012 0902   CREATININE 0.8 11/29/2014 0914   CREATININE 0.63 03/26/2012 0902   CALCIUM 9.1 11/29/2014 0914   CALCIUM 8.8 03/26/2012 0902   PROT 6.4 11/29/2014 0914   PROT 5.9* 03/26/2012 0902   ALBUMIN 3.6 11/29/2014 0914   ALBUMIN 3.8 03/26/2012 0902   AST 22 11/29/2014 0914   AST 15 03/26/2012 0902   ALT 30 11/29/2014 0914   ALT 10 03/26/2012 0902   ALKPHOS 109 11/29/2014 0914   ALKPHOS 84 03/26/2012 0902   BILITOT 0.42 11/29/2014 0914   BILITOT 0.5 03/26/2012 0902   GFRNONAA >60 08/16/2007 0425   GFRAA  08/16/2007 0425    >60        The eGFR has been calculated using the MDRD equation. This calculation has not been validated in all clinical    No results found for: SPEP, UPEP  Lab Results  Component Value Date   WBC 11.5* 11/29/2014   NEUTROABS 8.4* 11/29/2014   HGB 14.0 11/29/2014   HCT 41.8 11/29/2014   MCV 76.7* 11/29/2014   PLT 229 11/29/2014      Chemistry      Component Value Date/Time   NA 136 11/29/2014 0914   NA 140 03/26/2012 0902   K 4.1 11/29/2014 0914   K 4.1 03/26/2012 0902   CL 106 11/19/2012 0837   CL 107 03/26/2012 0902   CO2 22 11/29/2014 0914   CO2 26 03/26/2012 0902   BUN 11.0 11/29/2014 0914   BUN 10 03/26/2012 0902   CREATININE 0.8 11/29/2014 0914   CREATININE 0.63 03/26/2012 0902      Component Value Date/Time   CALCIUM 9.1 11/29/2014 0914   CALCIUM 8.8 03/26/2012 0902   ALKPHOS 109 11/29/2014 0914   ALKPHOS 84 03/26/2012 0902   AST 22 11/29/2014 0914   AST 15 03/26/2012 0902   ALT 30 11/29/2014 0914   ALT 10 03/26/2012 0902   BILITOT 0.42 11/29/2014 0914   BILITOT 0.5 03/26/2012 0902      ASSESSMENT & PLAN:  Splenic marginal zone b-cell lymphoma Overall, she responded well to treatment. She has no evidence of recurrence of hemolytic anemia. I shared  with her data regarding maintenance rituximab. We have made an informed decision not to pursue further treatment and just go on observation only.  I will see her back in 6 months with a repeat history, physical examination and blood work. Due to risk of infection, I recommend pneumococcal vaccination and she agreed to proceed today.     Tachycardia with 100 - 120 beats per minute  She has chronic tachycardia and palpitation. She has  Strong family history of dilated cardiomyopathy. Her last thyroid function test in September 2015 was within normal limits. I recommend cardiology consultation and she agreed to proceed.   Iron deficiency anemia due to chronic blood loss  She is not taking iron supplements. Iron studies are adequate. Continue close observation.   Acne  She has diffuse acneform rash and recurrent outbreak around her genital region. I am not sure what the cause of this  rash. She is ready taking Valtrex. I recommend dermatology consultation and she agreed to proceed.    Orders Placed This Encounter  Procedures  . Ambulatory referral to Dermatology    Referral Priority:  Routine    Referral Type:  Consultation    Referral Reason:  Specialty Services Required    Requested Specialty:  Dermatology    Number of Visits Requested:  1  . Ambulatory referral to Cardiology    Referral Priority:  Routine    Referral Type:  Consultation    Referral Reason:  Specialty Services Required    Requested Specialty:  Cardiology    Number of Visits Requested:  1   All questions were answered. The patient knows to call the clinic with any problems, questions or concerns. No barriers to learning was detected. I spent 25 minutes counseling the patient face to face. The total time spent in the appointment was 30 minutes and more than 50% was on counseling and review of test results     [facility], [place], MD 11/29/2014 10:55 AM

## 2014-11-29 NOTE — Assessment & Plan Note (Signed)
She is not taking iron supplements. Iron studies are adequate. Continue close observation.

## 2014-11-29 NOTE — Assessment & Plan Note (Signed)
She has chronic tachycardia and palpitation. She has  Strong family history of dilated cardiomyopathy. Her last thyroid function test in September 2015 was within normal limits. I recommend cardiology consultation and she agreed to proceed.

## 2015-02-20 ENCOUNTER — Telehealth: Payer: Self-pay | Admitting: *Deleted

## 2015-02-20 NOTE — Telephone Encounter (Signed)
Dr Irven Shelling office called to say patient was seen. ECHO and nuclear stress test ordered. Pt had ECHO, cancelled stress test and all future appts due to financial restraints. They tried to work with her, but no luck

## 2015-02-21 NOTE — Telephone Encounter (Signed)
OK 

## 2015-04-29 IMAGING — CT NM PET TUM IMG INITIAL (PI) SKULL BASE T - THIGH
1 of 7 series · 1 of 25 positions shown · non-contrast
Comparison: CTs of the chest, abdomen and pelvis 12/23/2010 and
chest CT 01/19/2009.

CLINICAL DATA: Initial treatment strategy for lymphoma.

EXAM:
NUCLEAR MEDICINE PET SKULL BASE TO THIGH
TECHNIQUE: 13.4 mCi F-18 FDG was injected intravenously. Full-ring PET imaging
was performed from the skull base to thigh after the radiotracer. CT
data was obtained and used for attenuation correction and anatomic
localization.
FASTING BLOOD GLUCOSE:  Value: 147 mg/dl

[Series 4: ct sk_thigh 5.0 hd_fov · axial · 5.0mm · 1.17mm/px · 1 of 229 slices shown]
[im 229/229  brain]
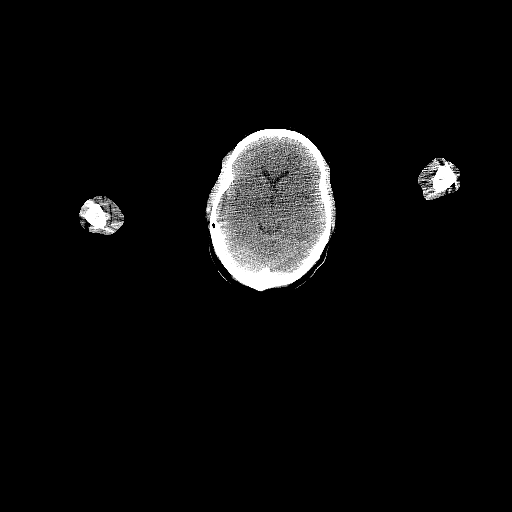

[1 of 25 positions shown; findings below may reference images not displayed]

FINDINGS: NECK

No hypermetabolic cervical lymph nodes are identified.There are no
lesions of the pharyngeal mucosal space. A 2.1 cm left thyroid
nodule does not show any abnormal metabolic activity and was present
on prior CTs. This does not appear grossly changed over this 5 year
interval.

CHEST

There are no hypermetabolic mediastinal, hilar or axillary lymph
nodes. There are no enlarged mediastinal or hilar lymph nodes. Small
axillary lymph nodes bilaterally are unchanged. There is a 5 mm
right lower lobe nodule on image 48. This has slightly enlarged
compared with the prior study.

ABDOMEN/PELVIS

There is no focal hypermetabolic activity within the liver, adrenal
glands, spleen or pancreas. There is mildly progressive
splenomegaly. The spleen measures up to 18.8 x 13.2 cm transverse
(previously 16.7 x 10.8 cm). No focal lesion is apparent. This
splenic metabolic activity is homogeneous and similar to the liver.
There are no hypermetabolic abdominal pelvic lymph nodes. Small
retroperitoneal lymph nodes are noted in, the largest and aortic
caval node measuring 11 mm on image 133. This has slightly enlarged
compared with the prior study.

SKELETON

There is no hypermetabolic activity to suggest osseous metastatic
disease.
IMPRESSION: 1. Progressive splenomegaly with borderline homogeneous metabolic
activity. No focal lesion identified.
2. Small retroperitoneal lymph nodes have minimally enlarged
compared with the prior CT, although do not show any hypermetabolic
activity. No enlarged or hypermetabolic lymph nodes are seen within
the neck or chest.
3. 5 mm right lower lobe pulmonary nodule has slightly enlarged
compared with the prior CT. If the patient is at high risk for
bronchogenic carcinoma, follow-up chest CT at 6-12 months is
recommended. If the patient is at low risk for bronchogenic
carcinoma, follow-up chest CT at 12 months is recommended. This
recommendation follows the consensus statement: Guidelines for
Management of Small Pulmonary Nodules Detected on CT Scans: A
Statement from the [HOSPITAL] as published in Radiology
6887;[DATE].
4. Grossly stable left thyroid nodule without abnormal metabolic
activity.

## 2015-05-30 ENCOUNTER — Telehealth: Payer: Self-pay | Admitting: Hematology and Oncology

## 2015-05-30 ENCOUNTER — Ambulatory Visit (HOSPITAL_BASED_OUTPATIENT_CLINIC_OR_DEPARTMENT_OTHER): Payer: BLUE CROSS/BLUE SHIELD | Admitting: Hematology and Oncology

## 2015-05-30 ENCOUNTER — Other Ambulatory Visit (HOSPITAL_BASED_OUTPATIENT_CLINIC_OR_DEPARTMENT_OTHER): Payer: BLUE CROSS/BLUE SHIELD

## 2015-05-30 ENCOUNTER — Encounter: Payer: Self-pay | Admitting: Hematology and Oncology

## 2015-05-30 VITALS — BP 127/71 | HR 104 | Temp 97.7°F | Resp 18 | Ht 67.5 in | Wt 281.2 lb

## 2015-05-30 DIAGNOSIS — C8587 Other specified types of non-Hodgkin lymphoma, spleen: Secondary | ICD-10-CM

## 2015-05-30 DIAGNOSIS — R718 Other abnormality of red blood cells: Secondary | ICD-10-CM

## 2015-05-30 DIAGNOSIS — C8307 Small cell B-cell lymphoma, spleen: Secondary | ICD-10-CM

## 2015-05-30 LAB — COMPREHENSIVE METABOLIC PANEL (CC13)
ALBUMIN: 3.4 g/dL — AB (ref 3.5–5.0)
ALT: 15 U/L (ref 0–55)
AST: 8 U/L (ref 5–34)
Alkaline Phosphatase: 101 U/L (ref 40–150)
Anion Gap: 8 mEq/L (ref 3–11)
BUN: 11.9 mg/dL (ref 7.0–26.0)
CHLORIDE: 105 meq/L (ref 98–109)
CO2: 23 meq/L (ref 22–29)
Calcium: 8.7 mg/dL (ref 8.4–10.4)
Creatinine: 0.8 mg/dL (ref 0.6–1.1)
EGFR: >90 mL/min/{1.73_m2} (ref >90)
Glucose: 339 mg/dl — ABNORMAL HIGH (ref 70–140)
POTASSIUM: 4.2 meq/L (ref 3.5–5.1)
Sodium: 136 mEq/L (ref 136–145)
Total Bilirubin: 0.38 mg/dL (ref 0.20–1.20)
Total Protein: 6.1 g/dL — ABNORMAL LOW (ref 6.4–8.3)

## 2015-05-30 LAB — CBC WITH DIFFERENTIAL/PLATELET
BASO%: 0.4 % (ref 0.0–2.0)
BASOS ABS: 0 10*3/uL (ref 0.0–0.1)
EOS ABS: 0.1 10*3/uL (ref 0.0–0.5)
EOS%: 1 % (ref 0.0–7.0)
HEMATOCRIT: 40 % (ref 34.8–46.6)
HGB: 13.3 g/dL (ref 11.6–15.9)
LYMPH%: 26.1 % (ref 14.0–49.7)
MCH: 25.6 pg (ref 25.1–34.0)
MCHC: 33.3 g/dL (ref 31.5–36.0)
MCV: 76.9 fL — AB (ref 79.5–101.0)
MONO#: 0.6 10*3/uL (ref 0.1–0.9)
MONO%: 6.6 % (ref 0.0–14.0)
NEUT#: 5.9 10*3/uL (ref 1.5–6.5)
NEUT%: 65.9 % (ref 38.4–76.8)
Platelets: 191 10*3/uL (ref 145–400)
RBC: 5.2 10*6/uL (ref 3.70–5.45)
RDW: 14 % (ref 11.2–14.5)
WBC: 9 10*3/uL (ref 3.9–10.3)
lymph#: 2.3 10*3/uL (ref 0.9–3.3)

## 2015-05-30 NOTE — Progress Notes (Signed)
[place] OFFICE PROGRESS NOTE  Patient Care Team: Rogers Blocker, MD as PCP - General (Internal Medicine) Heath Lark, MD as Consulting Physician (Hematology and Oncology)  SUMMARY OF ONCOLOGIC HISTORY:   Splenic marginal zone b-cell lymphoma   01/11/2009 Initial Diagnosis Splenic marginal zone b-cell lymphoma   06/06/2014 Bone Marrow Biopsy Bone marrow aspirate and biopsy confirmed extensive involvement by CD20 positive non-Hodgkin's lymphoma.   06/07/2014 Imaging She had a PET scan which showed predominant splenic involvement.   06/16/2014 - 07/07/2014 Chemotherapy She started on weekly rituximab.   06/16/2014 Adverse Reaction She had infusion reaction, resolved with IV dexamethasone.   08/18/2014 Imaging PET CT scan show resolution of hypermetabolic activity.    INTERVAL HISTORY: Please see below for problem oriented charting.  she returns for further follow-up. She denies further palpitation.  she has mild weight gain. She continues to have persistent hyperglycemia and poorly controlled diabetes.  she complained of mild menorrhagia. Denies new lymphadenopathy  REVIEW OF SYSTEMS:   Constitutional: Denies fevers, chills or abnormal weight loss Eyes: Denies blurriness of vision Ears, nose, mouth, throat, and face: Denies mucositis or sore throat Respiratory: Denies cough, dyspnea or wheezes Cardiovascular: Denies palpitation, chest discomfort or lower extremity swelling Gastrointestinal:  Denies nausea, heartburn or change in bowel habits Skin: Denies abnormal skin rashes Lymphatics: Denies new lymphadenopathy or easy bruising Neurological:Denies numbness, tingling or new weaknesses Behavioral/Psych: Mood is stable, no new changes  All other systems were reviewed with the patient and are negative.  I have reviewed the past medical history, past surgical history, social history and family history with the patient and they are unchanged from previous note.  ALLERGIES:  is  allergic to erythromycin; lovastatin; and tolnaftate.  MEDICATIONS:  Current Outpatient Prescriptions  Medication Sig Dispense Refill  . benazepril (LOTENSIN) 10 MG tablet Take 10 mg by mouth daily.     . fish oil-omega-3 fatty acids 1000 MG capsule Take 1 g by mouth 2 (two) times daily.     . furosemide (LASIX) 40 MG tablet Take 40 mg by mouth daily as needed (leg swelling).    . metFORMIN (GLUCOPHAGE) 1000 MG tablet Take 1,000 mg by mouth 2 (two) times daily with a meal.      . valACYclovir (VALTREX) 500 MG tablet Take 500 mg by mouth 2 (two) times daily. Takes for outbreaks only.     No current facility-administered medications for this visit.    PHYSICAL EXAMINATION: ECOG PERFORMANCE STATUS: 0 - Asymptomatic  Filed Vitals:   05/30/15 0942  BP: 127/71  Pulse: 104  Temp: 97.7 F (36.5 C)  Resp: 18   Filed Weights   05/30/15 0942  Weight: 281 lb 3.2 oz (127.551 kg)    GENERAL:alert, no distress and comfortable. She is morbidly obese SKIN: skin color, texture, turgor are normal, no rashes or significant lesions EYES: normal, Conjunctiva are pink and non-injected, sclera clear OROPHARYNX:no exudate, no erythema and lips, buccal mucosa, and tongue normal  NECK: supple, thyroid normal size, non-tender, without nodularity LYMPH:  no palpable lymphadenopathy in the cervical, axillary or inguinal LUNGS: clear to auscultation and percussion with normal breathing effort HEART: regular rate & rhythm and no murmurs and no lower extremity edema ABDOMEN:abdomen soft, non-tender and normal bowel sounds Musculoskeletal:no cyanosis of digits and no clubbing  NEURO: alert & oriented x 3 with fluent speech, no focal motor/sensory deficits  LABORATORY DATA:  I have reviewed the data as listed    Component Value Date/Time  NA 136 05/30/2015 0932   NA 140 03/26/2012 0902   K 4.2 05/30/2015 0932   K 4.1 03/26/2012 0902   CL 106 11/19/2012 0837   CL 107 03/26/2012 0902   CO2 23  05/30/2015 0932   CO2 26 03/26/2012 0902   GLUCOSE 339* 05/30/2015 0932   GLUCOSE 107* 11/19/2012 0837   GLUCOSE 105* 03/26/2012 0902   BUN 11.9 05/30/2015 0932   BUN 10 03/26/2012 0902   CREATININE 0.8 05/30/2015 0932   CREATININE 0.63 03/26/2012 0902   CALCIUM 8.7 05/30/2015 0932   CALCIUM 8.8 03/26/2012 0902   PROT 6.1* 05/30/2015 0932   PROT 5.9* 03/26/2012 0902   ALBUMIN 3.4* 05/30/2015 0932   ALBUMIN 3.8 03/26/2012 0902   AST 8 05/30/2015 0932   AST 15 03/26/2012 0902   ALT 15 05/30/2015 0932   ALT 10 03/26/2012 0902   ALKPHOS 101 05/30/2015 0932   ALKPHOS 84 03/26/2012 0902   BILITOT 0.38 05/30/2015 0932   BILITOT 0.5 03/26/2012 0902   GFRNONAA >60 08/16/2007 0425   GFRAA  08/16/2007 0425    >60        The eGFR has been calculated using the MDRD equation. This calculation has not been validated in all clinical    No results found for: SPEP, UPEP  Lab Results  Component Value Date   WBC 9.0 05/30/2015   NEUTROABS 5.9 05/30/2015   HGB 13.3 05/30/2015   HCT 40.0 05/30/2015   MCV 76.9* 05/30/2015   PLT 191 05/30/2015      Chemistry      Component Value Date/Time   NA 136 05/30/2015 0932   NA 140 03/26/2012 0902   K 4.2 05/30/2015 0932   K 4.1 03/26/2012 0902   CL 106 11/19/2012 0837   CL 107 03/26/2012 0902   CO2 23 05/30/2015 0932   CO2 26 03/26/2012 0902   BUN 11.9 05/30/2015 0932   BUN 10 03/26/2012 0902   CREATININE 0.8 05/30/2015 0932   CREATININE 0.63 03/26/2012 0902      Component Value Date/Time   CALCIUM 8.7 05/30/2015 0932   CALCIUM 8.8 03/26/2012 0902   ALKPHOS 101 05/30/2015 0932   ALKPHOS 84 03/26/2012 0902   AST 8 05/30/2015 0932   AST 15 03/26/2012 0902   ALT 15 05/30/2015 0932   ALT 10 03/26/2012 0902   BILITOT 0.38 05/30/2015 0932   BILITOT 0.5 03/26/2012 0902       ASSESSMENT & PLAN:  Splenic marginal zone b-cell lymphoma Overall, she responded well to treatment. She has no evidence of recurrence of hemolytic  anemia. I shared with her data regarding maintenance rituximab. We have made an informed decision not to pursue further treatment and just go on observation only.  I will see her back in 6 months with a repeat history, physical examination and blood work.  Microcytosis  She has evidence of microcytosis but not anemic. She has severe menorrhagia. I recommend she takes prenatal vitamin daily and I will reassess in 6 months.   No orders of the defined types were placed in this encounter.   All questions were answered. The patient knows to call the clinic with any problems, questions or concerns. No barriers to learning was detected. I spent 15 minutes counseling the patient face to face. The total time spent in the appointment was 20 minutes and more than 50% was on counseling and review of test results     [facility], [place], MD 05/30/2015 2:53 PM

## 2015-05-30 NOTE — Assessment & Plan Note (Signed)
Overall, she responded well to treatment. She has no evidence of recurrence of hemolytic anemia. I shared with her data regarding maintenance rituximab. We have made an informed decision not to pursue further treatment and just go on observation only.  I will see her back in 6 months with a repeat history, physical examination and blood work.

## 2015-05-30 NOTE — Assessment & Plan Note (Signed)
She has evidence of microcytosis but not anemic. She has severe menorrhagia. I recommend she takes prenatal vitamin daily and I will reassess in 6 months.

## 2015-05-30 NOTE — Telephone Encounter (Signed)
Gave avs & calendar for March. °

## 2015-06-19 ENCOUNTER — Other Ambulatory Visit: Payer: BLUE CROSS/BLUE SHIELD

## 2015-06-19 ENCOUNTER — Ambulatory Visit: Payer: BLUE CROSS/BLUE SHIELD | Admitting: Hematology and Oncology

## 2015-07-10 IMAGING — PT NM PET TUM IMG RESTAG (PS) SKULL BASE T - THIGH
7 series · 25 of 25 positions shown · non-contrast
Comparison: 06/07/2014.

CLINICAL DATA: Subsequent treatment strategy for lymphoma.
Restaging examination.

EXAM:
NUCLEAR MEDICINE PET SKULL BASE TO THIGH
TECHNIQUE: 13.6 mCi F-18 FDG was injected intravenously. Full-ring PET imaging
was performed from the skull base to thigh after the radiotracer. CT
data was obtained and used for attenuation correction and anatomic
localization.
FASTING BLOOD GLUCOSE:  Value: 152 mg/dl

[Series 3: pet sk_thigh ac · axial · 5.0mm · 4.07mm/px · z∈[-439,+433]mm · 6 of 219 slices shown]
[im 1/219]
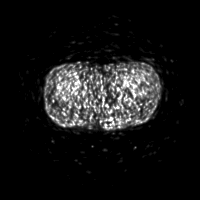
[im 44/219]
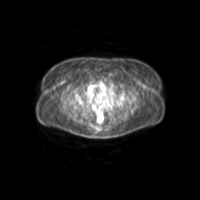
[im 88/219]
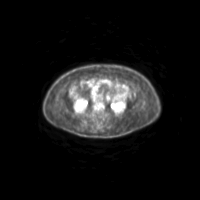
[im 131/219]
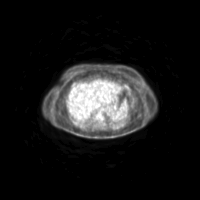
[im 175/219]
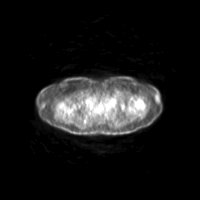
[im 219/219]
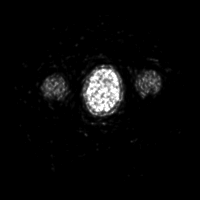

[Series 4: ct sk_thigh 5.0 hd_fov · axial · 5.0mm · 1.12mm/px · z∈[-439,+433]mm · 5 of 218 slices shown]
[im 1/218]
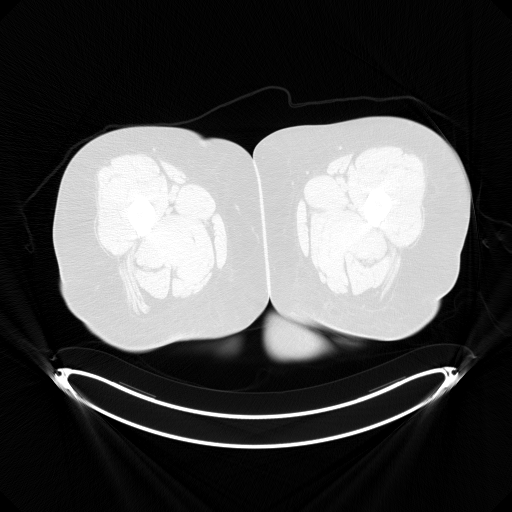
[im 55/218]
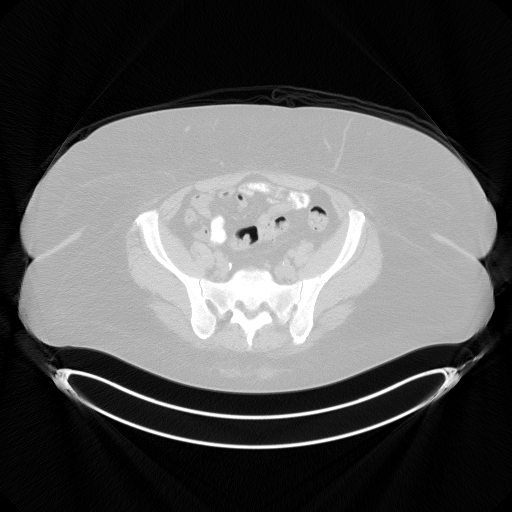
[im 109/218]
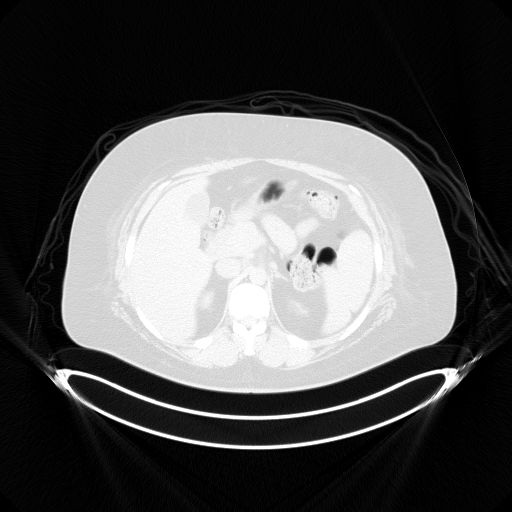
[im 163/218]
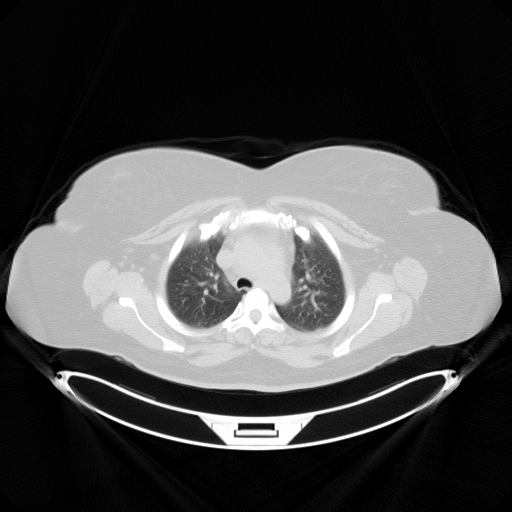
[im 218/218  brain]
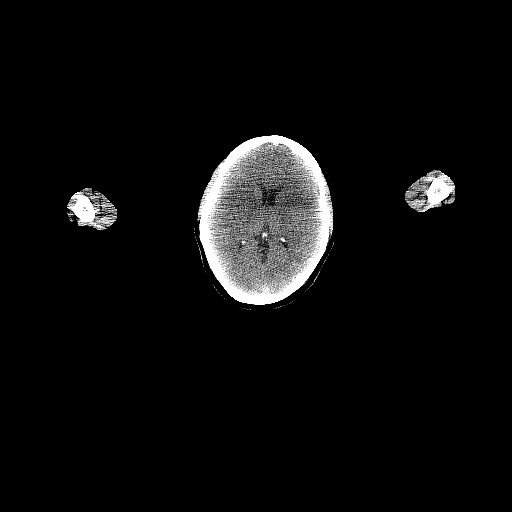

[Series 7: pet sk_thigh nac · axial · 5.0mm · 4.07mm/px · z∈[-439,+433]mm · 5 of 219 slices shown]
[im 1/219]
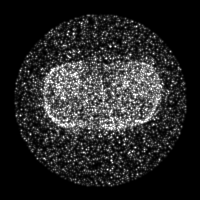
[im 55/219]
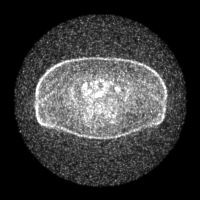
[im 110/219]
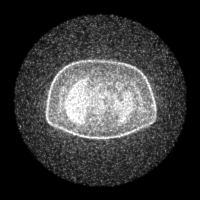
[im 164/219]
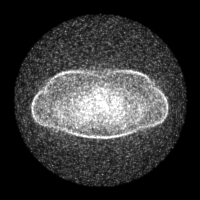
[im 219/219]
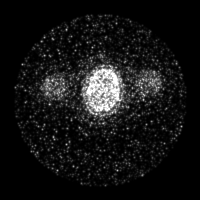

[Series 8: ct sk_thigh 5.0 b70f (id)_bone · axial · 5.0mm · 0.62mm/px · 1 of 58 slices shown]
[im 1/58  bone]
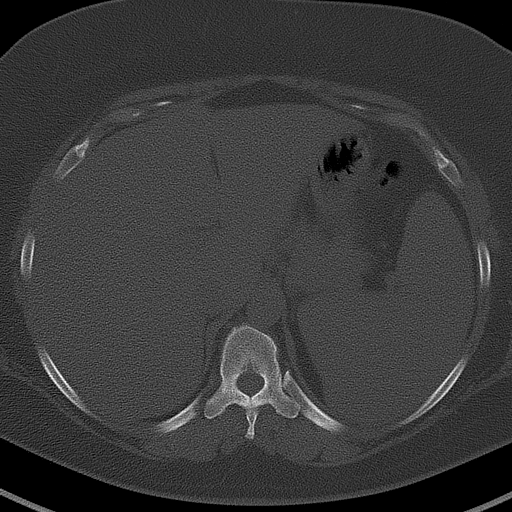

[Series 604: mip collection<mip range> · coronal · 1.81mm/px · 1 of 32 slices shown]
[im 1/32]
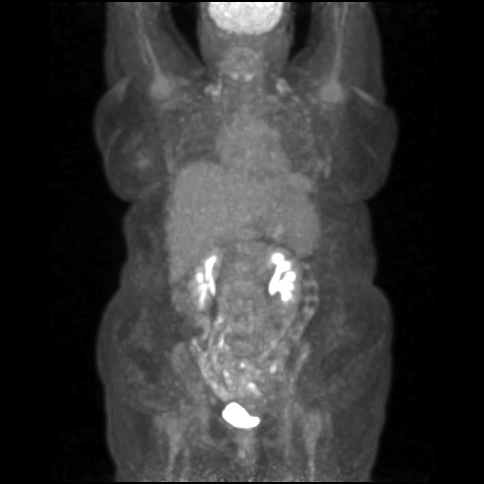

[Series 605: range-ct sk_thigh 5.0 hd_fov-cor-<alpha range> · 2 of 88 slices shown]
[im 1/88]
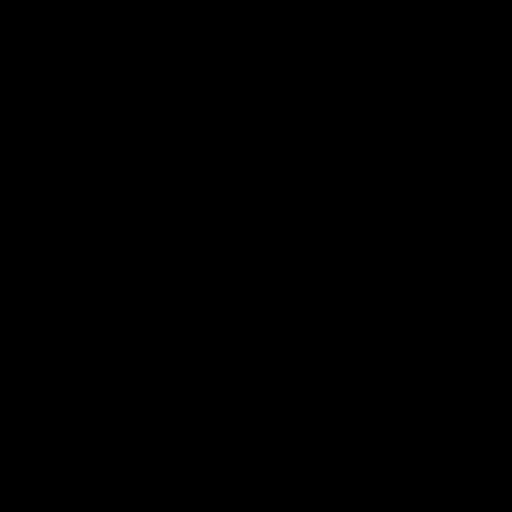
[im 88/88]
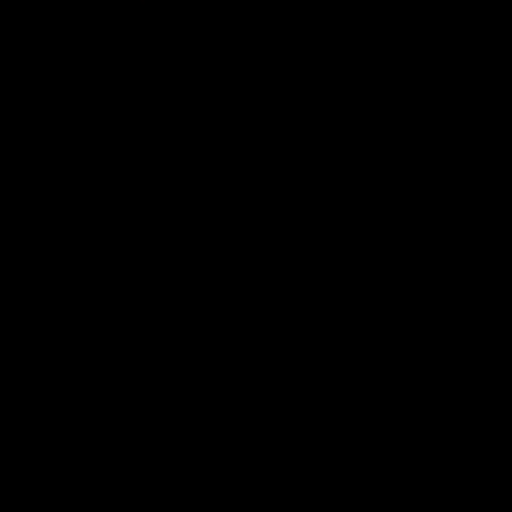

[Series 606: range-ct sk_thigh 5.0 hd_fov-tra-<alpha range> · 5 of 208 slices shown]
[im 1/208]
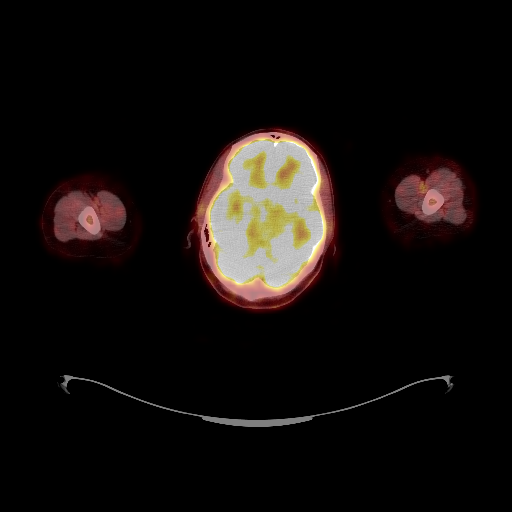
[im 52/208]
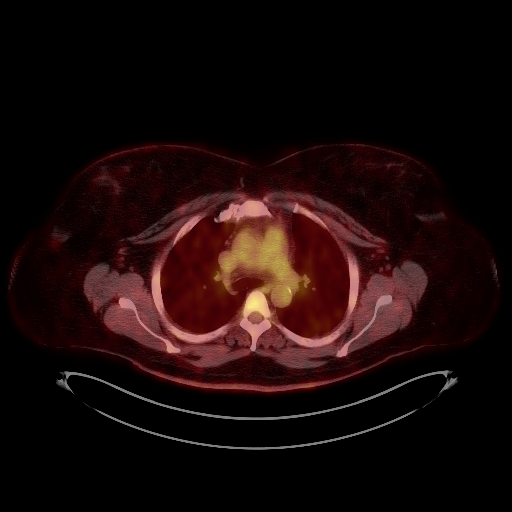
[im 104/208]
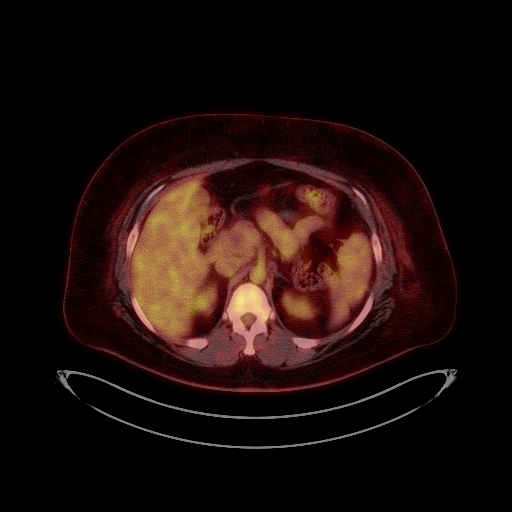
[im 156/208]
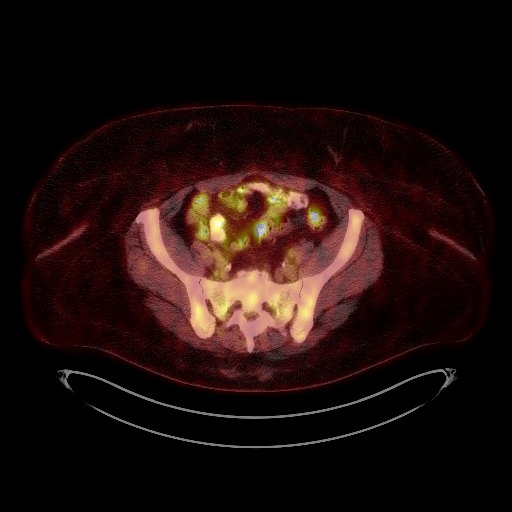
[im 208/208]
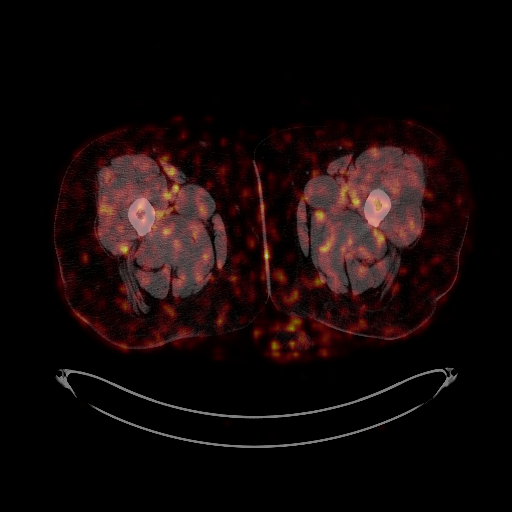

[25 of 25 positions shown; findings below may reference images not displayed]

FINDINGS: NECK

No hypermetabolic lymph nodes in the neck. There is a small amount
of physiologic hypermetabolism within brown fat in the cervical
regions bilaterally.

CHEST

No hypermetabolic mediastinal or hilar nodes. No suspicious
pulmonary nodules on the CT scan. 4 mm nodule in the posterior
aspect of the right lower lobe (image 36 of series 8) is slightly
smaller than the prior study, favored to represent a subpleural
lymph node.

ABDOMEN/PELVIS

No abnormal hypermetabolic activity within the liver, pancreas,
adrenal glands, or spleen. The spleen remains enlarged, measuring
15.5 x 5.9 x 14.0 cm (estimated volume 640 mL). However, the
metabolic activity in the spleen is diffusely homogeneous and less
than that seen in the liver. No hypermetabolic lymph nodes in the
abdomen or pelvis. Mild diffuse decreased attenuation throughout the
hepatic parenchyma, compatible with mild hepatic steatosis.

SKELETON

No focal hypermetabolic activity to suggest skeletal metastasis.
IMPRESSION: 1. No findings on today's study to suggest residual or recurrent
disease.
2. There is persistent splenomegaly, however, there is uniform
activity throughout the spleen, and this activity is less than that
of the liver.
3. Mild hepatic steatosis.
4. Previously noted right lower lobe pulmonary nodule is slightly
decreased in size compared to the prior study, similar to remote
prior examination from 12/13/2010, presumably benign (likely a
subpleural lymph node).

## 2015-07-23 ENCOUNTER — Emergency Department (HOSPITAL_COMMUNITY): Admission: EM | Admit: 2015-07-23 | Discharge: 2015-07-23 | Discharge status: Absent without leave | Payer: Self-pay

## 2015-08-16 ENCOUNTER — Ambulatory Visit
Admission: RE | Admit: 2015-08-16 | Discharge: 2015-08-16 | Disposition: A | Discharge status: Home / Self Care | Payer: BLUE CROSS/BLUE SHIELD | Source: Ambulatory Visit | Attending: Internal Medicine | Admitting: Internal Medicine

## 2015-08-16 ENCOUNTER — Other Ambulatory Visit: Payer: Self-pay | Admitting: Internal Medicine

## 2015-08-16 DIAGNOSIS — R52 Pain, unspecified: Secondary | ICD-10-CM

## 2015-08-16 DIAGNOSIS — M25561 Pain in right knee: Secondary | ICD-10-CM

## 2015-12-12 ENCOUNTER — Ambulatory Visit (HOSPITAL_BASED_OUTPATIENT_CLINIC_OR_DEPARTMENT_OTHER): Payer: BLUE CROSS/BLUE SHIELD | Admitting: Hematology and Oncology

## 2015-12-12 ENCOUNTER — Encounter: Payer: Self-pay | Admitting: Hematology and Oncology

## 2015-12-12 ENCOUNTER — Other Ambulatory Visit (HOSPITAL_BASED_OUTPATIENT_CLINIC_OR_DEPARTMENT_OTHER): Payer: BLUE CROSS/BLUE SHIELD

## 2015-12-12 ENCOUNTER — Telehealth: Payer: Self-pay | Admitting: Hematology and Oncology

## 2015-12-12 VITALS — BP 123/81 | HR 110 | Temp 98.2°F | Resp 18 | Wt 275.2 lb

## 2015-12-12 DIAGNOSIS — E119 Type 2 diabetes mellitus without complications: Secondary | ICD-10-CM

## 2015-12-12 DIAGNOSIS — C8307 Small cell B-cell lymphoma, spleen: Secondary | ICD-10-CM

## 2015-12-12 DIAGNOSIS — D500 Iron deficiency anemia secondary to blood loss (chronic): Secondary | ICD-10-CM

## 2015-12-12 DIAGNOSIS — N920 Excessive and frequent menstruation with regular cycle: Secondary | ICD-10-CM | POA: Diagnosis not present

## 2015-12-12 DIAGNOSIS — R718 Other abnormality of red blood cells: Secondary | ICD-10-CM | POA: Diagnosis not present

## 2015-12-12 LAB — COMPREHENSIVE METABOLIC PANEL
ALBUMIN: 3.6 g/dL (ref 3.5–5.0)
ALK PHOS: 100 U/L (ref 40–150)
ALT: 22 U/L (ref 0–55)
AST: 14 U/L (ref 5–34)
Anion Gap: 9 mEq/L (ref 3–11)
BUN: 7.9 mg/dL (ref 7.0–26.0)
CHLORIDE: 102 meq/L (ref 98–109)
CO2: 25 mEq/L (ref 22–29)
Calcium: 8.8 mg/dL (ref 8.4–10.4)
Creatinine: 0.9 mg/dL (ref 0.6–1.1)
EGFR: >90 mL/min/{1.73_m2} (ref >90)
GLUCOSE: 328 mg/dL — AB (ref 70–140)
POTASSIUM: 4.3 meq/L (ref 3.5–5.1)
SODIUM: 136 meq/L (ref 136–145)
Total Bilirubin: 0.57 mg/dL (ref 0.20–1.20)
Total Protein: 6.5 g/dL (ref 6.4–8.3)

## 2015-12-12 LAB — CBC WITH DIFFERENTIAL/PLATELET
BASO%: 0.7 % (ref 0.0–2.0)
BASOS ABS: 0.1 10*3/uL (ref 0.0–0.1)
EOS%: 1.3 % (ref 0.0–7.0)
Eosinophils Absolute: 0.1 10*3/uL (ref 0.0–0.5)
HEMATOCRIT: 40.9 % (ref 34.8–46.6)
HEMOGLOBIN: 13.1 g/dL (ref 11.6–15.9)
LYMPH#: 2.7 10*3/uL (ref 0.9–3.3)
LYMPH%: 26.3 % (ref 14.0–49.7)
MCH: 23.9 pg — AB (ref 25.1–34.0)
MCHC: 32.2 g/dL (ref 31.5–36.0)
MCV: 74.3 fL — ABNORMAL LOW (ref 79.5–101.0)
MONO#: 0.7 10*3/uL (ref 0.1–0.9)
MONO%: 7.2 % (ref 0.0–14.0)
NEUT#: 6.6 10*3/uL — ABNORMAL HIGH (ref 1.5–6.5)
NEUT%: 64.5 % (ref 38.4–76.8)
Platelets: 209 10*3/uL (ref 145–400)
RBC: 5.5 10*6/uL — ABNORMAL HIGH (ref 3.70–5.45)
RDW: 15 % — ABNORMAL HIGH (ref 11.2–14.5)
WBC: 10.2 10*3/uL (ref 3.9–10.3)

## 2015-12-12 NOTE — Progress Notes (Signed)
[facility] OFFICE PROGRESS NOTE  Patient Care Team: Rogers Blocker, MD as PCP - General (Internal Medicine) Heath Lark, MD as Consulting Physician (Hematology and Oncology)  SUMMARY OF ONCOLOGIC HISTORY:   Splenic marginal zone b-cell lymphoma ([place])   01/11/2009 Initial Diagnosis Splenic marginal zone b-cell lymphoma   06/06/2014 Bone Marrow Biopsy Bone marrow aspirate and biopsy confirmed extensive involvement by CD20 positive non-Hodgkin's lymphoma.   06/07/2014 Imaging She had a PET scan which showed predominant splenic involvement.   06/16/2014 - 07/07/2014 Chemotherapy She started on weekly rituximab.   06/16/2014 Adverse Reaction She had infusion reaction, resolved with IV dexamethasone.   08/18/2014 Imaging PET CT scan show resolution of hypermetabolic activity.    INTERVAL HISTORY: Please see below for problem oriented charting. She returns for further follow-up. She feels well. Denies new lymphadenopathy. Denies recent infection.  REVIEW OF SYSTEMS:   Constitutional: Denies fevers, chills or abnormal weight loss Eyes: Denies blurriness of vision Ears, nose, mouth, throat, and face: Denies mucositis or sore throat Respiratory: Denies cough, dyspnea or wheezes Cardiovascular: Denies palpitation, chest discomfort or lower extremity swelling Gastrointestinal:  Denies nausea, heartburn or change in bowel habits Skin: Denies abnormal skin rashes Lymphatics: Denies new lymphadenopathy or easy bruising Neurological:Denies numbness, tingling or new weaknesses Behavioral/Psych: Mood is stable, no new changes  All other systems were reviewed with the patient and are negative.  I have reviewed the past medical history, past surgical history, social history and family history with the patient and they are unchanged from previous note.  ALLERGIES:  is allergic to erythromycin; lovastatin; and tolnaftate.  MEDICATIONS:  Current Outpatient Prescriptions  Medication Sig Dispense  Refill  . fish oil-omega-3 fatty acids 1000 MG capsule Take 1 g by mouth 2 (two) times daily.     . furosemide (LASIX) 40 MG tablet Take 40 mg by mouth daily as needed (leg swelling).    Marland Kitchen losartan (COZAAR) 100 MG tablet Take 100 mg by mouth daily.    . metFORMIN (GLUCOPHAGE) 1000 MG tablet Take 1,000 mg by mouth 2 (two) times daily with a meal.      . valACYclovir (VALTREX) 500 MG tablet Take 500 mg by mouth 2 (two) times daily. Takes for outbreaks only.     No current facility-administered medications for this visit.    PHYSICAL EXAMINATION: ECOG PERFORMANCE STATUS: 0 - Asymptomatic  Filed Vitals:   12/12/15 0944  BP: 123/81  Pulse: 110  Temp: 98.2 F (36.8 C)  Resp: 18   Filed Weights   12/12/15 0944  Weight: 275 lb 3.2 oz (124.83 kg)    GENERAL:alert, no distress and comfortable. She is morbidly obese SKIN: skin color, texture, turgor are normal, no rashes or significant lesions EYES: normal, Conjunctiva are pink and non-injected, sclera clear OROPHARYNX:no exudate, no erythema and lips, buccal mucosa, and tongue normal  NECK: supple, thyroid normal size, non-tender, without nodularity LYMPH:  no palpable lymphadenopathy in the cervical, axillary or inguinal LUNGS: clear to auscultation and percussion with normal breathing effort HEART: regular rate & rhythm and no murmurs and no lower extremity edema ABDOMEN:abdomen soft, non-tender and normal bowel sounds Musculoskeletal:no cyanosis of digits and no clubbing  NEURO: alert & oriented x 3 with fluent speech, no focal motor/sensory deficits  LABORATORY DATA:  I have reviewed the data as listed    Component Value Date/Time   NA 136 12/12/2015 0929   NA 140 03/26/2012 0902   K 4.3 12/12/2015 0929   K 4.1  03/26/2012 0902   CL 106 11/19/2012 0837   CL 107 03/26/2012 0902   CO2 25 12/12/2015 0929   CO2 26 03/26/2012 0902   GLUCOSE 328* 12/12/2015 0929   GLUCOSE 107* 11/19/2012 0837   GLUCOSE 105* 03/26/2012 0902    BUN 7.9 12/12/2015 0929   BUN 10 03/26/2012 0902   CREATININE 0.9 12/12/2015 0929   CREATININE 0.63 03/26/2012 0902   CALCIUM 8.8 12/12/2015 0929   CALCIUM 8.8 03/26/2012 0902   PROT 6.5 12/12/2015 0929   PROT 5.9* 03/26/2012 0902   ALBUMIN 3.6 12/12/2015 0929   ALBUMIN 3.8 03/26/2012 0902   AST 14 12/12/2015 0929   AST 15 03/26/2012 0902   ALT 22 12/12/2015 0929   ALT 10 03/26/2012 0902   ALKPHOS 100 12/12/2015 0929   ALKPHOS 84 03/26/2012 0902   BILITOT 0.57 12/12/2015 0929   BILITOT 0.5 03/26/2012 0902   GFRNONAA >60 08/16/2007 0425   GFRAA  08/16/2007 0425    >60        The eGFR has been calculated using the MDRD equation. This calculation has not been validated in all clinical    No results found for: SPEP, UPEP  Lab Results  Component Value Date   WBC 10.2 12/12/2015   NEUTROABS 6.6* 12/12/2015   HGB 13.1 12/12/2015   HCT 40.9 12/12/2015   MCV 74.3* 12/12/2015   PLT 209 12/12/2015      Chemistry      Component Value Date/Time   NA 136 12/12/2015 0929   NA 140 03/26/2012 0902   K 4.3 12/12/2015 0929   K 4.1 03/26/2012 0902   CL 106 11/19/2012 0837   CL 107 03/26/2012 0902   CO2 25 12/12/2015 0929   CO2 26 03/26/2012 0902   BUN 7.9 12/12/2015 0929   BUN 10 03/26/2012 0902   CREATININE 0.9 12/12/2015 0929   CREATININE 0.63 03/26/2012 0902      Component Value Date/Time   CALCIUM 8.8 12/12/2015 0929   CALCIUM 8.8 03/26/2012 0902   ALKPHOS 100 12/12/2015 0929   ALKPHOS 84 03/26/2012 0902   AST 14 12/12/2015 0929   AST 15 03/26/2012 0902   ALT 22 12/12/2015 0929   ALT 10 03/26/2012 0902   BILITOT 0.57 12/12/2015 0929   BILITOT 0.5 03/26/2012 0902      ASSESSMENT & PLAN:  Splenic marginal zone b-cell lymphoma Overall, she responded well to treatment. She has no evidence of recurrence of hemolytic anemia. I will see her back in 6 months with a repeat history, physical examination and blood work.  Microcytosis She has evidence of microcytosis  but not anemic. She has severe menorrhagia. I recommend she takes prenatal vitamin daily and I will reassess in 6 months with iron study  Diabetes mellitus type 2, uncomplicated ([place]) she will continue current medical management. I recommend close follow-up with primary care doctor for medication adjustment.    Orders Placed This Encounter  Procedures  . CBC & Diff and Retic    Standing Status: Future     Number of Occurrences:      Standing Expiration Date: 01/15/2017  . Comprehensive metabolic panel    Standing Status: Future     Number of Occurrences:      Standing Expiration Date: 01/15/2017  . Lactate dehydrogenase (LDH) - [facility]    Standing Status: Future     Number of Occurrences:      Standing Expiration Date: 01/15/2017  . Ferritin    Standing Status: Future  Number of Occurrences:      Standing Expiration Date: 01/15/2017   All questions were answered. The patient knows to call the clinic with any problems, questions or concerns. No barriers to learning was detected. I spent 15 minutes counseling the patient face to face. The total time spent in the appointment was 20 minutes and more than 50% was on counseling and review of test results     [facility], [place], MD 12/12/2015 10:23 AM

## 2015-12-12 NOTE — Telephone Encounter (Signed)
per pof to sch pt appt-gave pt ocpy of avs °

## 2015-12-12 NOTE — Assessment & Plan Note (Signed)
Overall, she responded well to treatment. She has no evidence of recurrence of hemolytic anemia. I will see her back in 6 months with a repeat history, physical examination and blood work.

## 2015-12-12 NOTE — Assessment & Plan Note (Signed)
she will continue current medical management. I recommend close follow-up with primary care doctor for medication adjustment.  

## 2015-12-12 NOTE — Assessment & Plan Note (Signed)
She has evidence of microcytosis but not anemic. She has severe menorrhagia. I recommend she takes prenatal vitamin daily and I will reassess in 6 months with iron study

## 2016-03-13 DIAGNOSIS — I219 Acute myocardial infarction, unspecified: Secondary | ICD-10-CM

## 2016-03-13 HISTORY — DX: Acute myocardial infarction, unspecified: I21.9

## 2016-03-25 ENCOUNTER — Other Ambulatory Visit: Payer: Self-pay | Admitting: Internal Medicine

## 2016-03-25 DIAGNOSIS — Z1231 Encounter for screening mammogram for malignant neoplasm of breast: Secondary | ICD-10-CM

## 2016-03-28 ENCOUNTER — Ambulatory Visit
Admission: RE | Admit: 2016-03-28 | Discharge: 2016-03-28 | Disposition: A | Discharge status: Home / Self Care | Payer: BLUE CROSS/BLUE SHIELD | Source: Ambulatory Visit | Attending: Internal Medicine | Admitting: Internal Medicine

## 2016-03-28 DIAGNOSIS — Z1231 Encounter for screening mammogram for malignant neoplasm of breast: Secondary | ICD-10-CM

## 2016-04-08 ENCOUNTER — Inpatient Hospital Stay (HOSPITAL_COMMUNITY)
Admission: EM | Admit: 2016-04-08 | Discharge: 2016-04-10 | DRG: 247 | Disposition: A | Discharge status: Home / Self Care | Payer: BLUE CROSS/BLUE SHIELD | Attending: Internal Medicine | Admitting: Internal Medicine

## 2016-04-08 ENCOUNTER — Encounter (HOSPITAL_COMMUNITY): Payer: Self-pay | Admitting: Nurse Practitioner

## 2016-04-08 ENCOUNTER — Other Ambulatory Visit: Payer: Self-pay

## 2016-04-08 ENCOUNTER — Emergency Department (HOSPITAL_COMMUNITY): Payer: BLUE CROSS/BLUE SHIELD

## 2016-04-08 ENCOUNTER — Observation Stay (HOSPITAL_COMMUNITY): Payer: BLUE CROSS/BLUE SHIELD

## 2016-04-08 DIAGNOSIS — Z888 Allergy status to other drugs, medicaments and biological substances status: Secondary | ICD-10-CM

## 2016-04-08 DIAGNOSIS — C8307 Small cell B-cell lymphoma, spleen: Secondary | ICD-10-CM | POA: Diagnosis present

## 2016-04-08 DIAGNOSIS — E119 Type 2 diabetes mellitus without complications: Secondary | ICD-10-CM | POA: Insufficient documentation

## 2016-04-08 DIAGNOSIS — I2510 Atherosclerotic heart disease of native coronary artery without angina pectoris: Secondary | ICD-10-CM | POA: Diagnosis present

## 2016-04-08 DIAGNOSIS — E785 Hyperlipidemia, unspecified: Secondary | ICD-10-CM | POA: Diagnosis present

## 2016-04-08 DIAGNOSIS — R911 Solitary pulmonary nodule: Secondary | ICD-10-CM | POA: Diagnosis present

## 2016-04-08 DIAGNOSIS — Z87892 Personal history of anaphylaxis: Secondary | ICD-10-CM

## 2016-04-08 DIAGNOSIS — C8517 Unspecified B-cell lymphoma, spleen: Secondary | ICD-10-CM | POA: Diagnosis present

## 2016-04-08 DIAGNOSIS — R079 Chest pain, unspecified: Secondary | ICD-10-CM | POA: Diagnosis not present

## 2016-04-08 DIAGNOSIS — Z8249 Family history of ischemic heart disease and other diseases of the circulatory system: Secondary | ICD-10-CM

## 2016-04-08 DIAGNOSIS — E669 Obesity, unspecified: Secondary | ICD-10-CM | POA: Diagnosis present

## 2016-04-08 DIAGNOSIS — I10 Essential (primary) hypertension: Secondary | ICD-10-CM | POA: Diagnosis present

## 2016-04-08 DIAGNOSIS — D5701 Hb-SS disease with acute chest syndrome: Secondary | ICD-10-CM | POA: Diagnosis present

## 2016-04-08 DIAGNOSIS — I214 Non-ST elevation (NSTEMI) myocardial infarction: Principal | ICD-10-CM | POA: Insufficient documentation

## 2016-04-08 DIAGNOSIS — D630 Anemia in neoplastic disease: Secondary | ICD-10-CM | POA: Diagnosis present

## 2016-04-08 DIAGNOSIS — Z803 Family history of malignant neoplasm of breast: Secondary | ICD-10-CM

## 2016-04-08 DIAGNOSIS — Z881 Allergy status to other antibiotic agents status: Secondary | ICD-10-CM

## 2016-04-08 DIAGNOSIS — Z801 Family history of malignant neoplasm of trachea, bronchus and lung: Secondary | ICD-10-CM

## 2016-04-08 DIAGNOSIS — Z87891 Personal history of nicotine dependence: Secondary | ICD-10-CM

## 2016-04-08 DIAGNOSIS — Z6839 Body mass index (BMI) 39.0-39.9, adult: Secondary | ICD-10-CM

## 2016-04-08 DIAGNOSIS — E118 Type 2 diabetes mellitus with unspecified complications: Secondary | ICD-10-CM | POA: Insufficient documentation

## 2016-04-08 DIAGNOSIS — Z7984 Long term (current) use of oral hypoglycemic drugs: Secondary | ICD-10-CM

## 2016-04-08 DIAGNOSIS — E1165 Type 2 diabetes mellitus with hyperglycemia: Secondary | ICD-10-CM | POA: Diagnosis present

## 2016-04-08 DIAGNOSIS — IMO0002 Reserved for concepts with insufficient information to code with codable children: Secondary | ICD-10-CM | POA: Diagnosis present

## 2016-04-08 DIAGNOSIS — K760 Fatty (change of) liver, not elsewhere classified: Secondary | ICD-10-CM | POA: Diagnosis present

## 2016-04-08 DIAGNOSIS — Z833 Family history of diabetes mellitus: Secondary | ICD-10-CM

## 2016-04-08 DIAGNOSIS — Z955 Presence of coronary angioplasty implant and graft: Secondary | ICD-10-CM

## 2016-04-08 DIAGNOSIS — R000 Tachycardia, unspecified: Secondary | ICD-10-CM | POA: Diagnosis present

## 2016-04-08 DIAGNOSIS — Z883 Allergy status to other anti-infective agents status: Secondary | ICD-10-CM

## 2016-04-08 DIAGNOSIS — Z9221 Personal history of antineoplastic chemotherapy: Secondary | ICD-10-CM

## 2016-04-08 LAB — HEPATIC FUNCTION PANEL
ALT: 26 U/L (ref 14–54)
AST: 20 U/L (ref 15–41)
Albumin: 3.6 g/dL (ref 3.5–5.0)
Alkaline Phosphatase: 93 U/L (ref 38–126)
BILIRUBIN DIRECT: 0.2 mg/dL (ref 0.1–0.5)
BILIRUBIN INDIRECT: 0.6 mg/dL (ref 0.3–0.9)
TOTAL PROTEIN: 6.5 g/dL (ref 6.5–8.1)
Total Bilirubin: 0.8 mg/dL (ref 0.3–1.2)

## 2016-04-08 LAB — BASIC METABOLIC PANEL
Anion gap: 11 (ref 5–15)
BUN: 8 mg/dL (ref 6–20)
CALCIUM: 9.4 mg/dL (ref 8.9–10.3)
CHLORIDE: 101 mmol/L (ref 101–111)
CO2: 21 mmol/L — ABNORMAL LOW (ref 22–32)
Creatinine, Ser: 0.62 mg/dL (ref 0.44–1.00)
GFR calc non Af Amer: >60 mL/min (ref >60)
Glucose, Bld: 452 mg/dL — ABNORMAL HIGH (ref 65–99)
POTASSIUM: 4.1 mmol/L (ref 3.5–5.1)
Sodium: 133 mmol/L — ABNORMAL LOW (ref 135–145)

## 2016-04-08 LAB — CBC
HEMATOCRIT: 43.1 % (ref 36.0–46.0)
Hemoglobin: 13.9 g/dL (ref 12.0–15.0)
MCH: 23.5 pg — ABNORMAL LOW (ref 26.0–34.0)
MCHC: 32.3 g/dL (ref 30.0–36.0)
MCV: 72.8 fL — AB (ref 78.0–100.0)
Platelets: 202 10*3/uL (ref 150–400)
RBC: 5.92 MIL/uL — ABNORMAL HIGH (ref 3.87–5.11)
RDW: 14.1 % (ref 11.5–15.5)
WBC: 9.2 10*3/uL (ref 4.0–10.5)

## 2016-04-08 LAB — GLUCOSE, CAPILLARY
GLUCOSE-CAPILLARY: 313 mg/dL — AB (ref 65–99)
GLUCOSE-CAPILLARY: 236 mg/dL — AB (ref 65–99)

## 2016-04-08 LAB — TROPONIN I
TROPONIN I: 0.03 ng/mL — AB (ref <0.03)
Troponin I: 0.14 ng/mL (ref <0.03)

## 2016-04-08 LAB — LIPASE, BLOOD: Lipase: 25 U/L (ref 11–51)

## 2016-04-08 LAB — I-STAT TROPONIN, ED: Troponin i, poc: 0 ng/mL (ref 0.00–0.08)

## 2016-04-08 MED ORDER — ACETAMINOPHEN 325 MG PO TABS: 650.0000 mg | ORAL_TABLET | ORAL | Status: DC | PRN

## 2016-04-08 MED ORDER — MORPHINE SULFATE (PF) 4 MG/ML IV SOLN
4.0000 mg | Freq: Once | INTRAVENOUS | Status: AC
  Administered 2016-04-08: 4 mg via INTRAVENOUS
  Filled 2016-04-08: qty 1

## 2016-04-08 MED ORDER — GI COCKTAIL ~~LOC~~
30.0000 mL | Freq: Once | ORAL | Status: AC
  Administered 2016-04-08: 30 mL via ORAL
  Filled 2016-04-08: qty 30

## 2016-04-08 MED ORDER — SODIUM CHLORIDE 0.9% FLUSH
10.0000 mL | Freq: Two times a day (BID) | INTRAVENOUS | Status: DC
  Administered 2016-04-08 – 2016-04-10 (×4): 10 mL via INTRAVENOUS

## 2016-04-08 MED ORDER — NITROGLYCERIN 0.4 MG SL SUBL
0.4000 mg | SUBLINGUAL_TABLET | SUBLINGUAL | Status: DC | PRN
  Administered 2016-04-08: 0.4 mg via SUBLINGUAL
  Filled 2016-04-08: qty 1

## 2016-04-08 MED ORDER — IOPAMIDOL (ISOVUE-370) INJECTION 76%
INTRAVENOUS | Status: AC
  Administered 2016-04-08: 100 mL
  Filled 2016-04-08: qty 100

## 2016-04-08 MED ORDER — INSULIN ASPART 100 UNIT/ML ~~LOC~~ SOLN
0.0000 [IU] | Freq: Every day | SUBCUTANEOUS | Status: DC
  Administered 2016-04-08: 2 [IU] via SUBCUTANEOUS
  Administered 2016-04-09: 3 [IU] via SUBCUTANEOUS

## 2016-04-08 MED ORDER — ASPIRIN 81 MG PO CHEW
324.0000 mg | CHEWABLE_TABLET | Freq: Once | ORAL | Status: AC
  Administered 2016-04-08: 324 mg via ORAL
  Filled 2016-04-08: qty 4

## 2016-04-08 MED ORDER — PANTOPRAZOLE SODIUM 40 MG PO TBEC
40.0000 mg | DELAYED_RELEASE_TABLET | Freq: Every day | ORAL | Status: DC
  Administered 2016-04-08 – 2016-04-10 (×3): 40 mg via ORAL
  Filled 2016-04-08 (×3): qty 1

## 2016-04-08 MED ORDER — GI COCKTAIL ~~LOC~~
30.0000 mL | Freq: Four times a day (QID) | ORAL | Status: DC | PRN
  Administered 2016-04-08 – 2016-04-09 (×2): 30 mL via ORAL
  Filled 2016-04-08 (×3): qty 30

## 2016-04-08 MED ORDER — OMEGA-3-ACID ETHYL ESTERS 1 G PO CAPS
1.0000 g | ORAL_CAPSULE | Freq: Two times a day (BID) | ORAL | Status: DC
  Administered 2016-04-08 – 2016-04-10 (×4): 1 g via ORAL
  Filled 2016-04-08 (×4): qty 1

## 2016-04-08 MED ORDER — SODIUM CHLORIDE 0.9% FLUSH: 10.0000 mL | INTRAVENOUS | Status: DC | PRN

## 2016-04-08 MED ORDER — MORPHINE SULFATE (PF) 2 MG/ML IV SOLN
2.0000 mg | Freq: Once | INTRAVENOUS | Status: AC
  Administered 2016-04-08: 2 mg via INTRAVENOUS
  Filled 2016-04-08: qty 1

## 2016-04-08 MED ORDER — ONDANSETRON HCL 4 MG/2ML IJ SOLN
4.0000 mg | Freq: Once | INTRAMUSCULAR | Status: AC
  Administered 2016-04-08: 4 mg via INTRAVENOUS
  Filled 2016-04-08: qty 2

## 2016-04-08 MED ORDER — ENOXAPARIN SODIUM 40 MG/0.4ML ~~LOC~~ SOLN
40.0000 mg | SUBCUTANEOUS | Status: DC
  Administered 2016-04-08: 40 mg via SUBCUTANEOUS
  Filled 2016-04-08: qty 0.4

## 2016-04-08 MED ORDER — ONDANSETRON HCL 4 MG/2ML IJ SOLN: 4.0000 mg | Freq: Four times a day (QID) | INTRAMUSCULAR | Status: DC | PRN

## 2016-04-08 MED ORDER — ASPIRIN EC 325 MG PO TBEC
325.0000 mg | DELAYED_RELEASE_TABLET | Freq: Every day | ORAL | Status: DC
  Administered 2016-04-08 – 2016-04-09 (×2): 325 mg via ORAL
  Filled 2016-04-08 (×2): qty 1

## 2016-04-08 MED ORDER — INSULIN ASPART 100 UNIT/ML ~~LOC~~ SOLN
0.0000 [IU] | Freq: Three times a day (TID) | SUBCUTANEOUS | Status: DC
  Administered 2016-04-08: 11 [IU] via SUBCUTANEOUS
  Administered 2016-04-09: 18:00:00 8 [IU] via SUBCUTANEOUS
  Administered 2016-04-09: 5 [IU] via SUBCUTANEOUS
  Administered 2016-04-10: 8 [IU] via SUBCUTANEOUS
  Administered 2016-04-10: 5 [IU] via SUBCUTANEOUS

## 2016-04-08 MED ORDER — LOSARTAN POTASSIUM 50 MG PO TABS
100.0000 mg | ORAL_TABLET | Freq: Every day | ORAL | Status: DC
  Administered 2016-04-09 – 2016-04-10 (×2): 100 mg via ORAL
  Filled 2016-04-08 (×2): qty 2

## 2016-04-08 MED ORDER — MORPHINE SULFATE (PF) 2 MG/ML IV SOLN
2.0000 mg | INTRAVENOUS | Status: DC | PRN
  Administered 2016-04-08 – 2016-04-09 (×5): 2 mg via INTRAVENOUS
  Filled 2016-04-08 (×5): qty 1

## 2016-04-08 MED ORDER — PRENATAL 27-0.8 MG PO TABS
1.0000 | ORAL_TABLET | Freq: Every day | ORAL | Status: DC
  Administered 2016-04-09: 18:00:00 1 via ORAL
  Filled 2016-04-08 (×3): qty 1

## 2016-04-08 NOTE — ED Notes (Signed)
PA Sam aware that patient is needing to sit on side of stretcher to get a good breath and appears very uncomfortable c/o chest and back pain with pulse in 120's

## 2016-04-08 NOTE — ED Provider Notes (Signed)
Complains of anterior substernal chest pain onset 6 PM yesterday which radiates to left parathoracic area. She treated self with Mylanta last night with transient relief. Pain recurred gradual in onset 6 AM today pain is nonpleuritic she denies shortness of breath denies abdominal pain no other associated symptoms. On exam alert appears mildly anxious lungs clear auscultation heart regular rate and rhythm tachycardic abdomen obese nontender extremities without edema  Orlie Dakin, MD 04/08/16 1600

## 2016-04-08 NOTE — ED Notes (Signed)
Pt c/o CP since last night. she took mylanta and drank gingerale with some relief but pain returned. She reports pain has been increasingly worse since and is radiating into her back. She reports nausea. she denies sob, cough. Pain is increased with exertion. She is alert and breathing easily.

## 2016-04-08 NOTE — ED Notes (Signed)
Patient transported to Ultrasound 

## 2016-04-08 NOTE — ED Notes (Signed)
Dr. J at bedside 

## 2016-04-08 NOTE — H&P (Signed)
History and Physical    Desiree [name] [id number] DOB: 08/22/1966 DOA: 04/08/2016   PCP: Rogers Blocker, MD   Patient coming from/Resides with: Private residence/lives with husband  Chief Complaint: Chest pain  HPI: Desiree [name] is a 50 y.o. 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 Patient presents to the ER with reports of left anterior and substernal chest pain radiating to the back. Symptoms began yesterday evening she began having this discomfort over her left breast. She thought it was indigestion. She states it worsened when she was supine. She initially tried vinegar and water solution as well as walking which somewhat minimized her symptoms but did not relieve them. By this morning she continued to have mild symptoms but treated with Pepto-Bismol as well as ginger ale which further diminished her symptoms. She went to work as usual about 9:30 AM and when she attempted to eat a fried chicken biscuit, her chest pain and indigestion type symptoms recurred. She presented to the ER because her symptoms were severe and continue to radiate into her back. Patient reports while walking to the triage area today she became somewhat fatigued and had increased chest pain. Of note patient has been under significant stress since February of this year after her mother had an MI. Her mother is now in a nursing facility in [place]. The patient continues to work a job in [place] and drive back and forth from her home in [place] and visit and assist with the care of her mother at least 5-7 days per week. She reports because of his schedule she eats out frequently and "grabs food on the run". She does not take NSAIDs.  ED Course:  Temp 97.6-BP 160 01/11/2005-pulse 124 and regular-respirations 20-room air saturations 98% Follow-up vital signs EP  11/05/1990-pulse 102-respirations 16-saturations 98% CT angiogram of the chest/abdomen and pelvis w and w/o contrast: No evidence for aortic dissection or vascular injury, stable mild splenomegaly, 5 mm nodule right lower lobe demonstrated interval increase in size with recommendation to repeat noncontrast chest CT in 12 months Two-view chest x-ray: No active cardiac pulmonary disease Lab data: Sodium 133, potassium 4.1, CO2 21, BUN 8, creatinine 0.62, glucose 452, anion gap 11, point-of-care troponin 0.00, WBCs 9200 differential not obtained, hemoglobin 13.9, MCV 72.8, platelets 202,000 Medications and treatments: Chewable aspirin 324 mg by mouth 1, nitroglycerin 0.4 mg sublingual 1, Zofran 4 mg IV 1, morphine 4 mg 1, morphine 2 mg IV 1, GI cocktail 30 mL 1  Review of Systems:  In addition to the HPI above,  No Fever-chills, myalgias or other constitutional symptoms No Headache, changes with Vision or hearing, new weakness, tingling, numbness in any extremity, No problems swallowing food or Liquids No Cough or Shortness of Breath, palpitations, orthopnea or DOE, stable recurrent chronic bilateral lower extremity edema  No N/V; no melena or hematochezia, no dark tarry stools No dysuria, hematuria or flank pain No new skin rashes, lesions, masses or bruises, No new joints pains-aches No recent weight gain or loss No polyuria, polydypsia or polyphagia,   Past Medical History  Diagnosis Date  . Leukocytosis 07/27/2011  . Hypertension   . Iron deficiency anemia due to chronic blood loss   . Fatigue 11/17/2013  . 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 Diabetes mellitus without complication ([place])   . Splenic marginal zone b-cell lymphoma ([place]) 01/2009    On observation  .  Non Hodgkin's lymphoma ([place])     recieving chemo 06/2014  . Palpitation 06/30/2014  . Tachycardia with 100 - 120 beats per minute 11/29/2014    History reviewed. No pertinent past surgical history.  Social History    Social History  . Marital Status: Married    Spouse Name: N/A  . Number of Children: N/A  . Years of Education: N/A   Occupational History  . Not on file.   Social History Main Topics  . Smoking status: Former Research scientist (life sciences)  . Smokeless tobacco: Never Used  . Alcohol Use: No  . Drug Use: No  . Sexual Activity: Yes    Birth Control/ Protection: None   Other Topics Concern  . Not on file   Social History Narrative    Mobility: Without assistive devices Work history: Currently employed and works in [place] but lives in [place]  . Erythromycin Anaphylaxis    Swelling and difficulty breathing.  . Lovastatin     Leg cramps  . Tolnaftate Hives    Reaction caused by pills and the cream.    Family History  Problem Relation Age of Onset  . Cancer Maternal Aunt     breast ca  . Cancer Maternal Uncle     lung ca        Myocardial infarction and diabetes    Mother   Prior to Admission medications   Medication Sig Start Date End Date Taking? Authorizing Provider  acetaminophen (TYLENOL) 500 MG tablet Take 1,000 mg by mouth every 8 (eight) hours as needed (pain).   Yes Historical Provider, MD  calcium & magnesium carbonates (MYLANTA) 311-232 MG tablet Take 1 tablet by mouth once.   Yes Historical Provider, MD  fish oil-omega-3 fatty acids 1000 MG capsule Take 1 g by mouth 2 (two) times daily.    Yes Historical Provider, MD  furosemide (LASIX) 40 MG tablet Take 40 mg by mouth daily as needed (leg swelling).   Yes Historical Provider, MD  losartan (COZAAR) 100 MG tablet Take 100 mg by mouth daily. 12/10/15  Yes Historical Provider, MD  metFORMIN (GLUCOPHAGE) 1000 MG tablet Take 1,000 mg by mouth 2 (two) times daily with a meal.     Yes Historical Provider, MD  Prenatal Vit-Fe Fumarate-FA (PRENATAL PO) Take 1 tablet by mouth daily.   Yes Historical Provider, MD  valACYclovir (VALTREX) 500 MG tablet Take 500 mg by mouth 2 (two) times daily as  needed. Takes for outbreaks only. 05/02/14   Historical Provider, MD    Physical Exam: Filed Vitals:   04/08/16 1215 04/08/16 1330 04/08/16 1415 04/08/16 1500  BP:  121/79 133/83 124/92  Pulse: 127 105 106 100  Temp:      TempSrc:      Resp: 17 16 17 16   SpO2: 98% 99% 95% 98%      Constitutional: NAD, calm, comfortable Eyes: PERRL, lids and conjunctivae normal ENMT: Mucous membranes are moist. Posterior pharynx clear of any exudate or lesions.Normal dentition.  Neck: normal, supple, no masses, no thyromegaly Respiratory: clear to auscultation bilaterally, no wheezing, no crackles. Normal respiratory effort. No accessory muscle use.  Cardiovascular: Regular rate and rhythm, no murmurs / rubs / gallops. 1+ bilateral lower extremity edema. 2+ pedal pulses. No carotid bruits.  Abdomen: no tenderness since administration of IV pain medications, no masses palpated. No appreciable hepatosplenomegaly. Bowel sounds positive.  Musculoskeletal: no clubbing / cyanosis. No joint deformity upper and lower extremities. Good ROM, no  contractures. Normal muscle tone.  Skin: no rashes, lesions, ulcers. No induration Neurologic: CN 2-12 grossly intact. Sensation intact, DTR normal. Strength 5/5 x all 4 extremities.  Psychiatric: Normal judgment and insight. Alert and oriented x 3. Normal mood.    Labs on Admission: I have personally reviewed following labs and imaging studies  CBC:  Recent Labs Lab 04/08/16 1214  WBC 9.2  HGB 13.9  HCT 43.1  MCV 72.8*  PLT 123XX123   Basic Metabolic Panel:  Recent Labs Lab 04/08/16 1214  NA 133*  K 4.1  CL 101  CO2 21*  GLUCOSE 452*  BUN 8  CREATININE 0.62  CALCIUM 9.4   GFR: CrCl cannot be calculated (Unknown ideal weight.). Liver Function Tests: No results for input(s): AST, ALT, ALKPHOS, BILITOT, PROT, ALBUMIN in the last 168 hours. No results for input(s): LIPASE, AMYLASE in the last 168 hours. No results for input(s): AMMONIA in the last 168  hours. Coagulation Profile: No results for input(s): INR, PROTIME in the last 168 hours. Cardiac Enzymes: No results for input(s): CKTOTAL, CKMB, CKMBINDEX, TROPONINI in the last 168 hours. BNP (last 3 results) No results for input(s): PROBNP in the last 8760 hours. HbA1C: No results for input(s): HGBA1C in the last 72 hours. CBG: No results for input(s): GLUCAP in the last 168 hours. Lipid Profile: No results for input(s): CHOL, HDL, LDLCALC, TRIG, CHOLHDL, LDLDIRECT in the last 72 hours. Thyroid Function Tests: No results for input(s): TSH, T4TOTAL, FREET4, T3FREE, THYROIDAB in the last 72 hours. Anemia Panel: No results for input(s): VITAMINB12, FOLATE, FERRITIN, TIBC, IRON, RETICCTPCT in the last 72 hours. Urine analysis:    Component Value Date/Time   COLORURINE YELLOW 08/14/2007 1325   APPEARANCEUR TURBID* 08/14/2007 1325   LABSPEC 1.041* 08/14/2007 1325   PHURINE 6.0 08/14/2007 1325   GLUCOSEU >1000* 08/14/2007 1325   HGBUR NEGATIVE 08/14/2007 1325   BILIRUBINUR SMALL* 08/14/2007 1325   KETONESUR >80* 08/14/2007 1325   PROTEINUR 30* 08/14/2007 1325   UROBILINOGEN 1.0 08/14/2007 1325   NITRITE NEGATIVE 08/14/2007 1325   LEUKOCYTESUR NEGATIVE 08/14/2007 1325   Sepsis Labs: @LABRCNTIP (procalcitonin:4,lacticidven:4) )No results found for this or any previous visit (from the past 240 hour(s)).   Radiological Exams on Admission: Dg Chest 2 View  04/08/2016  CLINICAL DATA:  Chest pain starting yesterday EXAM: CHEST  2 VIEW COMPARISON:  08/18/2014 PET scan FINDINGS: Cardiomediastinal silhouette is stable. Cardiomediastinal silhouette is unremarkable. No acute infiltrate or pleural effusion. No pulmonary edema. Bony thorax is unremarkable. IMPRESSION: No active cardiopulmonary disease. Electronically Signed   By: Lahoma Crocker M.D.   On: 04/08/2016 12:35   Ct Angio Chest/abd/pel For Dissection W And/or W/wo  04/08/2016  CLINICAL DATA:  New onset of chest pain beginning last  evening with progression since appearing pain radiates into her back. Associated nausea. Pain is increased with exertion. Personal history of lymphoma. EXAM: CT ANGIOGRAPHY CHEST, ABDOMEN AND PELVIS TECHNIQUE: Multidetector CT imaging through the chest, abdomen and pelvis was performed using the standard protocol during bolus administration of intravenous contrast. Multiplanar reconstructed images and MIPs were obtained and reviewed to evaluate the vascular anatomy. CONTRAST:  100 mL Isovue 370 COMPARISON:  PET scan on 08/18/2014 FINDINGS: CTA CHEST FINDINGS New the ascending aorta is within normal limits. There no focal calcifications or intramural hemorrhage. The aortic arch is unremarkable. A 3 vessel arch configuration is present. The descending thoracic aorta is within normal limits. Pulmonary arteries are normal. Heart size is normal. There is no significant pericardial  effusion. No significant mediastinal or axillary adenopathy is present. The thoracic inlet demonstrates stable appearance of left thyroid nodule measuring 2.5 x 1.9 x 3.0 cm. A 5 mm nodule at the right lung base has slightly increased in size. No other focal nodule, mass, or airspace disease is present. Minimal dependent atelectasis is present bilaterally. Bone windows are unremarkable. Vertebral body heights alignment are maintained. The ribs are within normal limits. Review of the MIP images confirms the above findings. CTA ABDOMEN AND PELVIS FINDINGS The abdominal aorta is within normal limits. Minimal calcifications are present. There is no aneurysm or evidence for dissection. The celiac artery and superior mesenteric artery are within normal limits. The inferior mesenteric artery is identified and normal. Both renal arteries are within normal limits. The aortic bifurcation is unremarkable there are calcifications are present within the internal iliac arteries bilaterally. The liver is unremarkable. The common bile duct and gallbladder are  normal. The spleen is mildly enlarged without significant interval change. The pancreas is within normal limits. The adrenal glands and kidneys are normal bilaterally. The ureters and urinary bladder are within normal limits. Urinary bladder is mildly distended. The stomach and duodenum are within normal limits. Small bowel is unremarkable. The appendix is visualized and normal. The ascending and transverse colon are within normal limits. The descending and rectosigmoid colon are unremarkable. The uterus is mildly enlarged, without significant interval change. The adnexa are within normal limits bilaterally. No significant adenopathy is present. The axial skeleton is within normal limits. Pelvis is intact. Degenerative changes are noted at the SI joints bilaterally. Review of the MIP images confirms the above findings. IMPRESSION: 1. No evidence for aortic dissection or vascular injury. 2. Mild atherosclerotic changes within the abdominal aorta and branch vessels without aneurysm. 3. No significant adenopathy or evidence for recurrent lymphoma. 4. Stable mild splenomegaly. 5. 5 mm nodule in the right lower lobe demonstrates interval increase in size. No follow-up needed if patient is low-risk. Non-contrast chest CT can be considered in 12 months if patient is high-risk. This recommendation follows the consensus statement: Guidelines for Management of Incidental Pulmonary Nodules Detected on CT Images:From the Fleischner Society 2017; published online before print (10.1148/radiol.SG:5268862). 6. Degenerate changes at the SI joints bilaterally. Electronically Signed   By: San Morelle M.D.   On: 04/08/2016 13:39    EKG: (Independently reviewed) sinus tachycardia with ventricular rate 120 bpm, QTC 457 ms, no ischemic changes  Assessment/Plan Principal Problem:   Chest pain syndrome -Has both typical and atypical features concerning for possible ischemic etiology in setting of normal EKG and negative  point-of-care troponin but does have family history of CAD although mother quite advanced in age at onset of symptoms -Cycle troponin -NPO after midnight for Lexiscan stress test with [facility] to read (given obesity likely will require a 2 day study) -Heart score 4 -Echocardiogram -Symptoms seem worse after ingestion of fatty food and despite CT reporting normal appearing gallbladder and liver will obtain limited right upper quadrant ultrasound and check lipase and hepatic function panel  1605 ** RUQ abd [place] with normal GB and confirmed hepatic steatosis  Active Problems:   Diabetes mellitus type 2, uncontrolled  -Has been on metformin since 2009 -States hemoglobin A1c typically not in controlled range -Current CBG 450 with normal anion gap -Hold metformin post CT angiogram -Check CBGs and provide SSI -Check hemoglobin A1c -Patient declined diabetes educator consultation    Tachycardia -Likely related to pain and no underlying arrhythmia    Hypertension -  Initially poorly controlled at time of presentation in setting of significant chest/abdominal pain -Continue Cozaar -Takes prn Lasix for swelling but has not taken recently    Fatty liver disease, nonalcoholic -Noted on problem list -Follow-up on LFTs and ultrasound    Dyslipidemia -Continue omega-3 fatty acids -Has leg cramps with lovastatin    Splenic marginal zone b-cell lymphoma  -In remission, last saw Dr. 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 ANP-BC [facility] Pager [phone]   If 7PM-7AM, please contact night-coverage [email or web address] Password [facility]  04/08/2016, 3:27 PM

## 2016-04-08 NOTE — ED Notes (Signed)
Patient transported to X-ray 

## 2016-04-08 NOTE — ED Notes (Signed)
PA Sam at bedside 

## 2016-04-08 NOTE — ED Provider Notes (Signed)
CSN: [id number]     Arrival date & time 04/08/16  1126 History   First MD Initiated Contact with Patient 04/08/16 1156     Chief Complaint  Patient presents with  . Chest Pain   HPI  Desiree [name] is an 50 y.o. female with history of DM, HTN, HLD, lymphoma (now in remission) who presents to the ED for evaluation of chest pain. She states the pain started last night around 6 PM. She states the pain feels like a sharp substernal burning chest pain that shoots straight through to her back. She states she tried some mylanta which initially alleviated her pain but the pain has now returned and is progressively worse. She states it is constant and not worse with exertion or breathing. She denies new leg swelling or pain. She denies shortness of breath. Denies prior history of cardiac issues.  Past Medical History  Diagnosis Date  . Leukocytosis 07/27/2011  . Hypertension   . Iron deficiency anemia due to chronic blood loss   . Fatigue 11/17/2013  . 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 Diabetes mellitus without complication ([place])   . 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 Non Hodgkin's lymphoma ([place])     recieving chemo 06/2014  . Palpitation 06/30/2014  . Tachycardia with 100 - 120 beats per minute 11/29/2014   History reviewed. No pertinent past surgical history. Family History  Problem Relation Age of Onset  . Cancer Maternal Aunt     breast ca  . Cancer Maternal Uncle     lung ca   Social History  Substance Use Topics  . Smoking status: Former Research scientist (life sciences)  . Smokeless tobacco: Never Used  . Alcohol Use: No   OB History    No data available     Review of Systems  All other systems reviewed and are negative.     Allergies  Erythromycin; Lovastatin; and Tolnaftate  Home Medications   Prior to Admission medications   Medication Sig Start Date End Date Taking? Authorizing Provider  fish oil-omega-3 fatty acids 1000 MG capsule Take 1 g  by mouth 2 (two) times daily.     Historical Provider, MD  furosemide (LASIX) 40 MG tablet Take 40 mg by mouth daily as needed (leg swelling).    Historical Provider, MD  losartan (COZAAR) 100 MG tablet Take 100 mg by mouth daily. 12/10/15   Historical Provider, MD  metFORMIN (GLUCOPHAGE) 1000 MG tablet Take 1,000 mg by mouth 2 (two) times daily with a meal.      Historical Provider, MD  valACYclovir (VALTREX) 500 MG tablet Take 500 mg by mouth 2 (two) times daily. Takes for outbreaks only. 05/02/14   Historical Provider, MD   BP 164/106 mmHg  Pulse 126  Temp(Src) 97.6 F (36.4 C) (Oral)  Resp 17  SpO2 100% Physical Exam  Constitutional: She is oriented to person, place, and time.  Appears anxious, uncomfortable. Sitting at side of bed.   HENT:  Right Ear: External ear normal.  Left Ear: External ear normal.  Nose: Nose normal.  Mouth/Throat: Oropharynx is clear and moist. No oropharyngeal exudate.  Eyes: Conjunctivae and EOM are normal. Pupils are equal, round, and reactive to light.  Neck: Normal range of motion. Neck supple.  Cardiovascular: Regular rhythm, normal heart sounds and intact distal pulses.  Tachycardia present.   Pulmonary/Chest: Effort normal and breath sounds normal. No respiratory distress. She has no wheezes. She has no rales.  Abdominal: Soft. Bowel sounds are normal. She exhibits no distension. There is no tenderness.  Musculoskeletal: She exhibits no edema.  Neurological: She is alert and oriented to person, place, and time. No cranial nerve deficit.  Skin: Skin is warm and dry.  Psychiatric: She has a normal mood and affect.  Nursing note and vitals reviewed.   ED Course  Procedures (including critical care time) Labs Review Labs Reviewed  BASIC METABOLIC PANEL - Abnormal; Notable for the following:    Sodium 133 (*)    CO2 21 (*)    Glucose, Bld 452 (*)    All other components within normal limits  CBC - Abnormal; Notable for the following:    RBC 5.92  (*)    MCV 72.8 (*)    MCH 23.5 (*)    All other components within normal limits  URINE CULTURE  URINALYSIS, ROUTINE W REFLEX MICROSCOPIC (NOT AT [facility])  TROPONIN I  TROPONIN I  TROPONIN I  HEMOGLOBIN A1C  I-STAT TROPOININ, ED    Imaging Review Dg Chest 2 View  04/08/2016  CLINICAL DATA:  Chest pain starting yesterday EXAM: CHEST  2 VIEW COMPARISON:  08/18/2014 PET scan FINDINGS: Cardiomediastinal silhouette is stable. Cardiomediastinal silhouette is unremarkable. No acute infiltrate or pleural effusion. No pulmonary edema. Bony thorax is unremarkable. IMPRESSION: No active cardiopulmonary disease. Electronically Signed   By: Lahoma Crocker M.D.   On: 04/08/2016 12:35   Ct Angio Chest/abd/pel For Dissection W And/or W/wo  04/08/2016  CLINICAL DATA:  New onset of chest pain beginning last evening with progression since appearing pain radiates into her back. Associated nausea. Pain is increased with exertion. Personal history of lymphoma. EXAM: CT ANGIOGRAPHY CHEST, ABDOMEN AND PELVIS TECHNIQUE: Multidetector CT imaging through the chest, abdomen and pelvis was performed using the standard protocol during bolus administration of intravenous contrast. Multiplanar reconstructed images and MIPs were obtained and reviewed to evaluate the vascular anatomy. CONTRAST:  100 mL Isovue 370 COMPARISON:  PET scan on 08/18/2014 FINDINGS: CTA CHEST FINDINGS New the ascending aorta is within normal limits. There no focal calcifications or intramural hemorrhage. The aortic arch is unremarkable. A 3 vessel arch configuration is present. The descending thoracic aorta is within normal limits. Pulmonary arteries are normal. Heart size is normal. There is no significant pericardial effusion. No significant mediastinal or axillary adenopathy is present. The thoracic inlet demonstrates stable appearance of left thyroid nodule measuring 2.5 x 1.9 x 3.0 cm. A 5 mm nodule at the right lung base has slightly increased in size. No  other focal nodule, mass, or airspace disease is present. Minimal dependent atelectasis is present bilaterally. Bone windows are unremarkable. Vertebral body heights alignment are maintained. The ribs are within normal limits. Review of the MIP images confirms the above findings. CTA ABDOMEN AND PELVIS FINDINGS The abdominal aorta is within normal limits. Minimal calcifications are present. There is no aneurysm or evidence for dissection. The celiac artery and superior mesenteric artery are within normal limits. The inferior mesenteric artery is identified and normal. Both renal arteries are within normal limits. The aortic bifurcation is unremarkable there are calcifications are present within the internal iliac arteries bilaterally. The liver is unremarkable. The common bile duct and gallbladder are normal. The spleen is mildly enlarged without significant interval change. The pancreas is within normal limits. The adrenal glands and kidneys are normal bilaterally. The ureters and urinary bladder are within normal limits. Urinary bladder is mildly distended. The stomach and duodenum are within normal limits.  Small bowel is unremarkable. The appendix is visualized and normal. The ascending and transverse colon are within normal limits. The descending and rectosigmoid colon are unremarkable. The uterus is mildly enlarged, without significant interval change. The adnexa are within normal limits bilaterally. No significant adenopathy is present. The axial skeleton is within normal limits. Pelvis is intact. Degenerative changes are noted at the SI joints bilaterally. Review of the MIP images confirms the above findings. IMPRESSION: 1. No evidence for aortic dissection or vascular injury. 2. Mild atherosclerotic changes within the abdominal aorta and branch vessels without aneurysm. 3. No significant adenopathy or evidence for recurrent lymphoma. 4. Stable mild splenomegaly. 5. 5 mm nodule in the right lower lobe  demonstrates interval increase in size. No follow-up needed if patient is low-risk. Non-contrast chest CT can be considered in 12 months if patient is high-risk. This recommendation follows the consensus statement: Guidelines for Management of Incidental Pulmonary Nodules Detected on CT Images:From the Fleischner Society 2017; published online before print (10.1148/radiol.SG:5268862). 6. Degenerate changes at the SI joints bilaterally. Electronically Signed   By: San Morelle M.D.   On: 04/08/2016 13:39   I have personally reviewed and evaluated these images and lab results as part of my medical decision-making.   EKG Interpretation   Date/Time:  Tuesday April 08 2016 11:29:57 EDT Ventricular Rate:  120 PR Interval:  132 QRS Duration: 82 QT Interval:  324 QTC Calculation: 457 R Axis:   71 Text Interpretation:  Sinus tachycardia Otherwise normal ECG No  significant change since last tracing Confirmed by Winfred Leeds  MD, [employer]  [phone]) on 04/08/2016 11:51:08 AM      MDM   Final diagnoses:  Chest pain, unspecified chest pain type    Pt is an 50 y.o. female with history of lymphoma, HTN, HLD, DM who presents for chest pain that started last night. The pain is constant, not pleuritic or exertional. Radiates straight through to pt's back. Pain improved and controlled in the ED. Due to her initial clinical appearance, tachycardia to the 120s (baseline appears closer to low 100s) and risk factors we obtained a CT dissection study which was negative. Her initial workup including EKG, troponin, CXR, and labs also unrevealing. However, with a HEART score of 4 and clinical concern we will call for a medical admission for further evaluation of pt's symptoms.  I spoke with Erin Hearing of the hospitalist team who will come evaluate pt in the ED. Appreciate assistance.    Anne Ng, PA-C 04/08/16 [place], MD 04/08/16 [phone]

## 2016-04-08 NOTE — Progress Notes (Signed)
Critical lab called for troponin 0.03, Dr. Marily Memos made aware. Pt continues to have constant 7/10 Cp radiating to back better when sitting.

## 2016-04-08 NOTE — ED Notes (Signed)
Labs are being waived per Roanna Epley Sam-PA due to emergent need for CT scan and concern for Aortic dissection.

## 2016-04-09 ENCOUNTER — Other Ambulatory Visit: Payer: Self-pay

## 2016-04-09 ENCOUNTER — Other Ambulatory Visit (HOSPITAL_COMMUNITY): Payer: BLUE CROSS/BLUE SHIELD

## 2016-04-09 ENCOUNTER — Encounter (HOSPITAL_COMMUNITY): Payer: Self-pay | Admitting: Cardiology

## 2016-04-09 ENCOUNTER — Encounter (HOSPITAL_COMMUNITY)
Admission: EM | Disposition: A | Discharge status: Home / Self Care | Payer: Self-pay | Source: Home / Self Care | Attending: Internal Medicine

## 2016-04-09 DIAGNOSIS — E785 Hyperlipidemia, unspecified: Secondary | ICD-10-CM

## 2016-04-09 DIAGNOSIS — R079 Chest pain, unspecified: Secondary | ICD-10-CM | POA: Diagnosis present

## 2016-04-09 DIAGNOSIS — K760 Fatty (change of) liver, not elsewhere classified: Secondary | ICD-10-CM | POA: Diagnosis present

## 2016-04-09 DIAGNOSIS — E669 Obesity, unspecified: Secondary | ICD-10-CM | POA: Diagnosis present

## 2016-04-09 DIAGNOSIS — Z8249 Family history of ischemic heart disease and other diseases of the circulatory system: Secondary | ICD-10-CM | POA: Diagnosis not present

## 2016-04-09 DIAGNOSIS — Z7984 Long term (current) use of oral hypoglycemic drugs: Secondary | ICD-10-CM | POA: Diagnosis not present

## 2016-04-09 DIAGNOSIS — Z803 Family history of malignant neoplasm of breast: Secondary | ICD-10-CM | POA: Diagnosis not present

## 2016-04-09 DIAGNOSIS — Z801 Family history of malignant neoplasm of trachea, bronchus and lung: Secondary | ICD-10-CM | POA: Diagnosis not present

## 2016-04-09 DIAGNOSIS — Z9221 Personal history of antineoplastic chemotherapy: Secondary | ICD-10-CM | POA: Diagnosis not present

## 2016-04-09 DIAGNOSIS — I214 Non-ST elevation (NSTEMI) myocardial infarction: Secondary | ICD-10-CM | POA: Diagnosis present

## 2016-04-09 DIAGNOSIS — Z888 Allergy status to other drugs, medicaments and biological substances status: Secondary | ICD-10-CM | POA: Diagnosis not present

## 2016-04-09 DIAGNOSIS — E1165 Type 2 diabetes mellitus with hyperglycemia: Secondary | ICD-10-CM | POA: Diagnosis present

## 2016-04-09 DIAGNOSIS — Z87891 Personal history of nicotine dependence: Secondary | ICD-10-CM | POA: Diagnosis not present

## 2016-04-09 DIAGNOSIS — Z6839 Body mass index (BMI) 39.0-39.9, adult: Secondary | ICD-10-CM | POA: Diagnosis not present

## 2016-04-09 DIAGNOSIS — C8517 Unspecified B-cell lymphoma, spleen: Secondary | ICD-10-CM | POA: Diagnosis present

## 2016-04-09 DIAGNOSIS — Z883 Allergy status to other anti-infective agents status: Secondary | ICD-10-CM | POA: Diagnosis not present

## 2016-04-09 DIAGNOSIS — E119 Type 2 diabetes mellitus without complications: Secondary | ICD-10-CM | POA: Diagnosis not present

## 2016-04-09 DIAGNOSIS — R911 Solitary pulmonary nodule: Secondary | ICD-10-CM | POA: Diagnosis present

## 2016-04-09 DIAGNOSIS — E118 Type 2 diabetes mellitus with unspecified complications: Secondary | ICD-10-CM

## 2016-04-09 DIAGNOSIS — Z881 Allergy status to other antibiotic agents status: Secondary | ICD-10-CM | POA: Diagnosis not present

## 2016-04-09 DIAGNOSIS — I2510 Atherosclerotic heart disease of native coronary artery without angina pectoris: Secondary | ICD-10-CM

## 2016-04-09 DIAGNOSIS — I10 Essential (primary) hypertension: Secondary | ICD-10-CM | POA: Diagnosis present

## 2016-04-09 DIAGNOSIS — Z833 Family history of diabetes mellitus: Secondary | ICD-10-CM | POA: Diagnosis not present

## 2016-04-09 DIAGNOSIS — D5701 Hb-SS disease with acute chest syndrome: Secondary | ICD-10-CM | POA: Diagnosis present

## 2016-04-09 DIAGNOSIS — Z87892 Personal history of anaphylaxis: Secondary | ICD-10-CM | POA: Diagnosis not present

## 2016-04-09 HISTORY — PX: CARDIAC CATHETERIZATION: SHX172

## 2016-04-09 LAB — URINE MICROSCOPIC-ADD ON

## 2016-04-09 LAB — TROPONIN I
TROPONIN I: 0.45 ng/mL — AB (ref <0.03)
TROPONIN I: 0.82 ng/mL — AB (ref <0.03)
Troponin I: 0.73 ng/mL (ref <0.03)
Troponin I: 0.79 ng/mL (ref <0.03)

## 2016-04-09 LAB — URINALYSIS, ROUTINE W REFLEX MICROSCOPIC
BILIRUBIN URINE: NEGATIVE
HGB URINE DIPSTICK: NEGATIVE
Ketones, ur: 15 mg/dL — AB
Leukocytes, UA: NEGATIVE
Nitrite: NEGATIVE
Protein, ur: NEGATIVE mg/dL
pH: 5.5 (ref 5.0–8.0)

## 2016-04-09 LAB — POCT ACTIVATED CLOTTING TIME: Activated Clotting Time: 489 seconds

## 2016-04-09 LAB — GLUCOSE, CAPILLARY
GLUCOSE-CAPILLARY: 205 mg/dL — AB (ref 65–99)
Glucose-Capillary: 288 mg/dL — ABNORMAL HIGH (ref 65–99)
Glucose-Capillary: 221 mg/dL — ABNORMAL HIGH (ref 65–99)
Glucose-Capillary: 276 mg/dL — ABNORMAL HIGH (ref 65–99)
Glucose-Capillary: 272 mg/dL — ABNORMAL HIGH (ref 65–99)

## 2016-04-09 LAB — PREGNANCY, URINE: Preg Test, Ur: NEGATIVE

## 2016-04-09 LAB — TSH: TSH: 2.266 u[IU]/mL (ref 0.350–4.500)

## 2016-04-09 LAB — HEMOGLOBIN A1C
Hgb A1c MFr Bld: 12.7 % — ABNORMAL HIGH (ref 4.8–5.6)
Mean Plasma Glucose: 318 mg/dL

## 2016-04-09 LAB — PROTIME-INR
INR: 1.49 (ref 0.00–1.49)
Prothrombin Time: 18.1 s — ABNORMAL HIGH (ref 11.6–15.2)

## 2016-04-09 SURGERY — LEFT HEART CATH AND CORONARY ANGIOGRAPHY

## 2016-04-09 MED ORDER — FENTANYL CITRATE (PF) 100 MCG/2ML IJ SOLN
INTRAMUSCULAR | Status: AC
  Filled 2016-04-09: qty 2

## 2016-04-09 MED ORDER — SODIUM CHLORIDE 0.9 % IV SOLN
250.0000 mg | INTRAVENOUS | Status: DC | PRN
  Administered 2016-04-09: 1.75 mg/kg/h via INTRAVENOUS

## 2016-04-09 MED ORDER — HEPARIN (PORCINE) IN NACL 2-0.9 UNIT/ML-% IJ SOLN
INTRAMUSCULAR | Status: DC | PRN
  Administered 2016-04-09: 1000 mL

## 2016-04-09 MED ORDER — TICAGRELOR 90 MG PO TABS
ORAL_TABLET | ORAL | Status: AC
  Filled 2016-04-09: qty 2

## 2016-04-09 MED ORDER — HEPARIN (PORCINE) IN NACL 2-0.9 UNIT/ML-% IJ SOLN
INTRAMUSCULAR | Status: AC
  Filled 2016-04-09: qty 1500

## 2016-04-09 MED ORDER — SODIUM CHLORIDE 0.9 % IV SOLN
INTRAVENOUS | Status: DC
  Administered 2016-04-09: 10:00:00 via INTRAVENOUS

## 2016-04-09 MED ORDER — SODIUM CHLORIDE 0.9% FLUSH: 3.0000 mL | INTRAVENOUS | Status: DC | PRN

## 2016-04-09 MED ORDER — NITROGLYCERIN IN D5W 200-5 MCG/ML-% IV SOLN
0.0000 ug/min | INTRAVENOUS | Status: DC
  Administered 2016-04-09: 10 ug/min via INTRAVENOUS
  Filled 2016-04-09: qty 250

## 2016-04-09 MED ORDER — FENTANYL CITRATE (PF) 100 MCG/2ML IJ SOLN
INTRAMUSCULAR | Status: DC | PRN
  Administered 2016-04-09 (×3): 25 ug via INTRAVENOUS

## 2016-04-09 MED ORDER — SODIUM CHLORIDE 0.9% FLUSH
3.0000 mL | Freq: Two times a day (BID) | INTRAVENOUS | Status: DC
  Administered 2016-04-09: 3 mL via INTRAVENOUS

## 2016-04-09 MED ORDER — ASPIRIN 81 MG PO CHEW: 81.0000 mg | CHEWABLE_TABLET | ORAL | Status: DC

## 2016-04-09 MED ORDER — IOPAMIDOL (ISOVUE-370) INJECTION 76%
INTRAVENOUS | Status: AC
  Filled 2016-04-09: qty 200

## 2016-04-09 MED ORDER — SODIUM CHLORIDE 0.9 % WEIGHT BASED INFUSION: 3.0000 mL/kg/h | INTRAVENOUS | Status: DC

## 2016-04-09 MED ORDER — HEPARIN SODIUM (PORCINE) 1000 UNIT/ML IJ SOLN
INTRAMUSCULAR | Status: DC | PRN
  Administered 2016-04-09: 6000 [IU] via INTRAVENOUS

## 2016-04-09 MED ORDER — HEPARIN (PORCINE) IN NACL 100-0.45 UNIT/ML-% IJ SOLN
1350.0000 [IU]/h | INTRAMUSCULAR | Status: DC
  Administered 2016-04-09: 1350 [IU]/h via INTRAVENOUS
  Filled 2016-04-09: qty 250

## 2016-04-09 MED ORDER — NITROGLYCERIN 1 MG/10 ML FOR IR/CATH LAB
INTRA_ARTERIAL | Status: DC | PRN
  Administered 2016-04-09 (×2): 100 ug via INTRACORONARY

## 2016-04-09 MED ORDER — MIDAZOLAM HCL 2 MG/2ML IJ SOLN
INTRAMUSCULAR | Status: AC
  Filled 2016-04-09: qty 2

## 2016-04-09 MED ORDER — VERAPAMIL HCL 2.5 MG/ML IV SOLN
INTRAVENOUS | Status: AC
  Filled 2016-04-09: qty 2

## 2016-04-09 MED ORDER — LIVING WELL WITH DIABETES BOOK
Freq: Once | Status: AC
  Administered 2016-04-09: 22:00:00
  Filled 2016-04-09: qty 1

## 2016-04-09 MED ORDER — BIVALIRUDIN 250 MG IV SOLR
INTRAVENOUS | Status: AC
  Filled 2016-04-09: qty 250

## 2016-04-09 MED ORDER — HEPARIN BOLUS VIA INFUSION
4000.0000 [IU] | Freq: Once | INTRAVENOUS | Status: AC
  Administered 2016-04-09: 4000 [IU] via INTRAVENOUS
  Filled 2016-04-09: qty 4000

## 2016-04-09 MED ORDER — BIVALIRUDIN BOLUS VIA INFUSION - CUPID
INTRAVENOUS | Status: DC | PRN
  Administered 2016-04-09: 92.025 mg via INTRAVENOUS

## 2016-04-09 MED ORDER — HEPARIN SODIUM (PORCINE) 1000 UNIT/ML IJ SOLN
INTRAMUSCULAR | Status: AC
  Filled 2016-04-09: qty 1

## 2016-04-09 MED ORDER — LIDOCAINE HCL (PF) 1 % IJ SOLN
INTRAMUSCULAR | Status: DC | PRN
  Administered 2016-04-09: 2 mL via SUBCUTANEOUS

## 2016-04-09 MED ORDER — SODIUM CHLORIDE 0.9 % IV SOLN: 250.0000 mL | INTRAVENOUS | Status: DC | PRN

## 2016-04-09 MED ORDER — ASPIRIN 81 MG PO CHEW
81.0000 mg | CHEWABLE_TABLET | Freq: Every day | ORAL | Status: DC
  Administered 2016-04-10: 10:00:00 81 mg via ORAL
  Filled 2016-04-09: qty 1

## 2016-04-09 MED ORDER — SODIUM CHLORIDE 0.9% FLUSH
3.0000 mL | Freq: Two times a day (BID) | INTRAVENOUS | Status: DC
  Administered 2016-04-09 – 2016-04-10 (×2): 3 mL via INTRAVENOUS

## 2016-04-09 MED ORDER — TICAGRELOR 90 MG PO TABS
90.0000 mg | ORAL_TABLET | Freq: Two times a day (BID) | ORAL | Status: DC
  Administered 2016-04-10 (×2): 90 mg via ORAL
  Filled 2016-04-09 (×2): qty 1

## 2016-04-09 MED ORDER — MIDAZOLAM HCL 2 MG/2ML IJ SOLN
INTRAMUSCULAR | Status: DC | PRN
  Administered 2016-04-09 (×2): 1 mg via INTRAVENOUS

## 2016-04-09 MED ORDER — VERAPAMIL HCL 2.5 MG/ML IV SOLN
INTRAVENOUS | Status: DC | PRN
  Administered 2016-04-09: 10 mL via INTRA_ARTERIAL

## 2016-04-09 MED ORDER — HEART ATTACK BOUNCING BOOK
Freq: Once | Status: AC
  Administered 2016-04-09: 21:00:00
  Filled 2016-04-09: qty 1

## 2016-04-09 MED ORDER — ANGIOPLASTY BOOK
Freq: Once | Status: AC
  Administered 2016-04-09: 21:00:00
  Filled 2016-04-09: qty 1

## 2016-04-09 MED ORDER — NITROGLYCERIN 0.4 MG/HR TD PT24
0.4000 mg | MEDICATED_PATCH | Freq: Every day | TRANSDERMAL | Status: DC
  Filled 2016-04-09: qty 1

## 2016-04-09 MED ORDER — TICAGRELOR 90 MG PO TABS
ORAL_TABLET | ORAL | Status: DC | PRN
  Administered 2016-04-09: 180 mg via ORAL

## 2016-04-09 MED ORDER — IOPAMIDOL (ISOVUE-370) INJECTION 76%
INTRAVENOUS | Status: DC | PRN
  Administered 2016-04-09: 130 mL via INTRA_ARTERIAL

## 2016-04-09 MED ORDER — INSULIN STARTER KIT- PEN NEEDLES (ENGLISH)
1.0000 | Freq: Once | Status: AC
  Administered 2016-04-09: 1
  Filled 2016-04-09 (×2): qty 1

## 2016-04-09 MED ORDER — LIDOCAINE HCL (PF) 1 % IJ SOLN
INTRAMUSCULAR | Status: AC
  Filled 2016-04-09: qty 30

## 2016-04-09 MED ORDER — NITROGLYCERIN 1 MG/10 ML FOR IR/CATH LAB
INTRA_ARTERIAL | Status: AC
  Filled 2016-04-09: qty 10

## 2016-04-09 MED ORDER — METOPROLOL TARTRATE 25 MG PO TABS
25.0000 mg | ORAL_TABLET | Freq: Two times a day (BID) | ORAL | Status: DC
  Administered 2016-04-09 – 2016-04-10 (×3): 25 mg via ORAL
  Filled 2016-04-09 (×3): qty 1

## 2016-04-09 SURGICAL SUPPLY — 18 items
BALLN EUPHORA RX 2.0X12 (BALLOONS) ×3
BALLN ~~LOC~~ EMERGE MR 2.25X8 (BALLOONS) ×3 IMPLANT
BALLOON EUPHORA RX 2.0X12 (BALLOONS) ×1 IMPLANT
CATH INFINITI 5 FR JL3.5 (CATHETERS) ×3 IMPLANT
CATH INFINITI 5FR ANG PIGTAIL (CATHETERS) ×3 IMPLANT
CATH INFINITI JR4 5F (CATHETERS) ×3 IMPLANT
CATH VISTA GUIDE 6FR XBLAD3.5 (CATHETERS) ×3 IMPLANT
DEVICE RAD COMP TR BAND LRG (VASCULAR PRODUCTS) ×3 IMPLANT
GLIDESHEATH SLEND SS 6F .021 (SHEATH) ×3 IMPLANT
KIT ENCORE 26 ADVANTAGE (KITS) ×3 IMPLANT
KIT HEART LEFT (KITS) ×3 IMPLANT
PACK CARDIAC CATHETERIZATION (CUSTOM PROCEDURE TRAY) ×3 IMPLANT
STENT PROMUS PREM MR 2.25X12 (Permanent Stent) ×3 IMPLANT
SYR MEDRAD MARK V 150ML (SYRINGE) ×3 IMPLANT
TRANSDUCER W/STOPCOCK (MISCELLANEOUS) ×3 IMPLANT
TUBING CIL FLEX 10 FLL-RA (TUBING) ×3 IMPLANT
WIRE ASAHI PROWATER 180CM (WIRE) ×3 IMPLANT
WIRE SAFE-T 1.5MM-J .035X260CM (WIRE) ×3 IMPLANT

## 2016-04-09 NOTE — Progress Notes (Signed)
troponin .82, cards made aware. Pt has only slight "discomfort" 2/10 mid center chest.

## 2016-04-09 NOTE — H&P (View-Only) (Signed)
CARDIOLOGY CONSULT NOTE   Patient ID: Desiree [name] MRN: [id number] DOB/AGE: 12-12-1965 50 y.o.  Admit date: 04/08/2016  Primary Physician   Rogers Blocker, MD Primary Cardiologist   New Reason for Consultation  Chest pain Requesting Physician  Dr. Erlinda Hong  HPI: Desiree [name] is a 50 y.o. 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    The patient is in significant stress since February 2017 after her mother had MI (at age 54). She is in nursing facility [place], [place]. She lives there but comes to [facility] for work. He drives back and forth every day and frequently is outside while driving.  Monday evening around 6 PM he had a episode of left-sided upper chest pain while sitting. Felt like indigestion and worsened with supine position. Minimally improvement with weaning and water solution. Her discomfort lasted all night and she went to  work on Tuesday where after taking biscuit her symptoms reoccurred. Minimal improvement again with the Pepto-Bismol. This pain is more severe and worse. She also had a aching sensation to her epigastric area and radiation to her left sided chest. Also complaining of radiation to her back. Questionable exacerbation of pain with exertion. Denies shortness of breath, nausea, vomiting or diaphoresis. She takes when necessary Lasix for intermittent lower extremity edema. Denies orthopnea, PND, syncope, melena or blood in her stool or urine. She used to smoke half a pack a day for 10 years and quit 2006. Never seen by any cardiologist. She continues to have 4 out of 10 achiness over her left chest. No recent palpitations.   ECG on admission shows sinus tachycardia at rate of 120 bpm. No acute ST or T wave changes. Chest x-ray clear. Lytes normal. A1c of 12.7. LDL 125.  Elevated blood glucose. CT angiogram of chest and abdominal and pelvis showed no evidence of aortic dissection, mild splenomegaly, 5 mm right lower lobe nodules. Right upper quadrant ultrasound showed hepatic steatosis stenosis. Liver profile normal. Troponin 0.0.-->0.14-->0.45.   Past Medical History  Diagnosis Date  . Leukocytosis 07/27/2011  . Hypertension   . Iron deficiency anemia due to chronic blood loss   . Fatigue 11/17/2013  . 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 Diabetes mellitus without complication ([place])   . Splenic marginal zone b-cell lymphoma ([place]) 01/2009    On observation  . Non Hodgkin's lymphoma ([place])     recieving chemo 06/2014  . Palpitation 06/30/2014  . Tachycardia with 100 - 120 beats per minute 11/29/2014     Past Surgical History  Procedure Laterality Date  . Wisdom tooth extraction      Allergies  Allergen Reactions  . Erythromycin Anaphylaxis    Swelling and difficulty breathing.  . Lovastatin     Leg cramps  . Tolnaftate Hives    Reaction caused by pills and the cream.    I have reviewed the patient's current medications . aspirin EC  325 mg Oral Daily  . enoxaparin (LOVENOX) injection  40 mg Subcutaneous Q24H  . insulin aspart  0-15 Units Subcutaneous TID WC  . insulin aspart  0-5 Units Subcutaneous QHS  . losartan  100 mg Oral Daily  . metoprolol tartrate  25 mg Oral BID  . multivitamin-prenatal  1 tablet Oral Q1200  . omega-3 acid ethyl esters  1 g Oral BID  .  pantoprazole  40 mg Oral Daily  . sodium chloride flush  10 mL Intravenous Q12H     acetaminophen, gi cocktail, morphine injection, nitroGLYCERIN, ondansetron (ZOFRAN) IV, sodium chloride flush  Prior to Admission medications   Medication Sig Start Date End Date Taking? Authorizing Provider  acetaminophen (TYLENOL) 500 MG tablet Take 1,000 mg by mouth every 8 (eight) hours as needed (pain).   Yes Historical Provider, MD  calcium & magnesium carbonates (MYLANTA) 311-232 MG tablet  Take 1 tablet by mouth once.   Yes Historical Provider, MD  fish oil-omega-3 fatty acids 1000 MG capsule Take 1 g by mouth 2 (two) times daily.    Yes Historical Provider, MD  furosemide (LASIX) 40 MG tablet Take 40 mg by mouth daily as needed (leg swelling).   Yes Historical Provider, MD  losartan (COZAAR) 100 MG tablet Take 100 mg by mouth daily. 12/10/15  Yes Historical Provider, MD  metFORMIN (GLUCOPHAGE) 1000 MG tablet Take 1,000 mg by mouth 2 (two) times daily with a meal.     Yes Historical Provider, MD  Prenatal Vit-Fe Fumarate-FA (PRENATAL PO) Take 1 tablet by mouth daily.   Yes Historical Provider, MD  valACYclovir (VALTREX) 500 MG tablet Take 500 mg by mouth 2 (two) times daily as needed. Takes for outbreaks only. 05/02/14   Historical Provider, MD     Social History   Social History  . Marital Status: Married    Spouse Name: N/A  . Number of Children: N/A  . Years of Education: N/A   Occupational History  . Not on file.   Social History Main Topics  . Smoking status: Former Smoker    Quit date: 10/13/2005  . Smokeless tobacco: Never Used  . Alcohol Use: No  . Drug Use: No  . Sexual Activity: Yes    Birth Control/ Protection: None   Other Topics Concern  . Not on file   Social History Narrative    No family status information on file.   Family History  Problem Relation Age of Onset  . Cancer Maternal Aunt     breast ca  . Cancer Maternal Uncle     lung ca      ROS:  Full 14 point review of systems complete and found to be negative unless listed above.  Physical Exam: Blood pressure 135/85, pulse 112, temperature 97.9 F (36.6 C), temperature source Oral, resp. rate 22, height 5\' 9"  (1.753 m), weight 270 lb 8 oz (122.698 kg), last menstrual period 11/09/2015, SpO2 97 %.  General: Well developed, well nourished, female in no acute distress Head: Eyes PERRLA, No xanthomas. Normocephalic and atraumatic, oropharynx without edema or exudate.  Lungs: Resp regular  and unlabored, CTA. Tender to palpation in epigastric area.  Heart: RRR no s3, s4, or murmurs..   Neck: No carotid bruits. No lymphadenopathy.  No JVD. Abdomen: Bowel sounds present, abdomen soft and non-tender without masses or hernias noted. Msk:  No spine or cva tenderness. No weakness, no joint deformities or effusions. Extremities: No clubbing, cyanosis or edema. DP/PT/Radials 2+ and equal bilaterally. Neuro: Alert and oriented X 3. No focal deficits noted. Psych:  Good affect, responds appropriately Skin: No rashes or lesions noted.  Labs:   Lab Results  Component Value Date   WBC 9.2 04/08/2016   HGB 13.9 04/08/2016   HCT 43.1 04/08/2016   MCV 72.8* 04/08/2016   PLT 202 04/08/2016   No results for input(s): INR in the last 72 hours.  Recent Labs  Lab 04/08/16 1214 04/08/16 1529  NA 133*  --   K 4.1  --   CL 101  --   CO2 21*  --   BUN 8  --   CREATININE 0.62  --   CALCIUM 9.4  --   PROT  --  6.5  BILITOT  --  0.8  ALKPHOS  --  93  ALT  --  26  AST  --  20  GLUCOSE 452*  --   ALBUMIN  --  3.6   No results found for: MG  Recent Labs  04/08/16 1529 04/08/16 2100 04/09/16 0227  TROPONINI 0.03* 0.14* 0.45*    Recent Labs  04/08/16 1221  TROPIPOC 0.00   No results found for: PROBNP Lab Results  Component Value Date   CHOL 186 04/09/2016   HDL 30* 04/09/2016   LDLCALC 125* 04/09/2016   TRIG 155* 04/09/2016   No results found for: DDIMER LIPASE  Date/Time Value Ref Range Status  04/08/2016 03:29 PM 25 11 - 51 U/L Final    Echo: 01/2009 Left ventricle: The cavity size was normal. Wall thickness was normal. Systolic function was normal. Wall motion was normal; there were no regional wall motion abnormalities. Wall motion score: 1.00. Aortic valve: Trileaflet; normal thickness leaflets. Mobility was not restricted. Doppler: Transvalvular velocity was within the normal range. There was no stenosis. No regurgitation. Aorta: Aortic root: The aortic  root was normal in size. Mitral valve: Structurally normal valve. Mobility was not restricted. Doppler: Transvalvular velocity was within the normal range. There was no evidence for stenosis. Trivial regurgitation. Peak gradient: 53mm Hg (D). Left atrium: The atrium was mildly dilated. Right ventricle: The cavity size was normal. Systolic function was normal. Pulmonic valve: Doppler: Transvalvular velocity was within the normal range. There was no evidence for stenosis. Tricuspid valve: Structurally normal valve. Doppler: Transvalvular velocity was within the normal range. Trivial regurgitation. Pulmonary artery: The main pulmonary artery was normal-sized. Systolic pressure was within the normal range. Pericardium: There was no pericardial effusion. Systemic veins: Inferior vena cava: The vessel was normal in size.  ECG:  Sinus tachycardia at rate of 120 bpm  Radiology:  Dg Chest 2 View  04/08/2016  CLINICAL DATA:  Chest pain starting yesterday EXAM: CHEST  2 VIEW COMPARISON:  08/18/2014 PET scan FINDINGS: Cardiomediastinal silhouette is stable. Cardiomediastinal silhouette is unremarkable. No acute infiltrate or pleural effusion. No pulmonary edema. Bony thorax is unremarkable. IMPRESSION: No active cardiopulmonary disease. Electronically Signed   By: Lahoma Crocker M.D.   On: 04/08/2016 12:35   Ct Angio Chest/abd/pel For Dissection W And/or W/wo  04/08/2016  CLINICAL DATA:  New onset of chest pain beginning last evening with progression since appearing pain radiates into her back. Associated nausea. Pain is increased with exertion. Personal history of lymphoma. EXAM: CT ANGIOGRAPHY CHEST, ABDOMEN AND PELVIS TECHNIQUE: Multidetector CT imaging through the chest, abdomen and pelvis was performed using the standard protocol during bolus administration of intravenous contrast. Multiplanar reconstructed images and MIPs were obtained and reviewed to evaluate the vascular anatomy. CONTRAST:  100 mL  Isovue 370 COMPARISON:  PET scan on 08/18/2014 FINDINGS: CTA CHEST FINDINGS New the ascending aorta is within normal limits. There no focal calcifications or intramural hemorrhage. The aortic arch is unremarkable. A 3 vessel arch configuration is present. The descending thoracic aorta is within normal limits. Pulmonary arteries are normal. Heart size is normal. There is no significant pericardial effusion. No significant mediastinal or axillary adenopathy is present. The  thoracic inlet demonstrates stable appearance of left thyroid nodule measuring 2.5 x 1.9 x 3.0 cm. A 5 mm nodule at the right lung base has slightly increased in size. No other focal nodule, mass, or airspace disease is present. Minimal dependent atelectasis is present bilaterally. Bone windows are unremarkable. Vertebral body heights alignment are maintained. The ribs are within normal limits. Review of the MIP images confirms the above findings. CTA ABDOMEN AND PELVIS FINDINGS The abdominal aorta is within normal limits. Minimal calcifications are present. There is no aneurysm or evidence for dissection. The celiac artery and superior mesenteric artery are within normal limits. The inferior mesenteric artery is identified and normal. Both renal arteries are within normal limits. The aortic bifurcation is unremarkable there are calcifications are present within the internal iliac arteries bilaterally. The liver is unremarkable. The common bile duct and gallbladder are normal. The spleen is mildly enlarged without significant interval change. The pancreas is within normal limits. The adrenal glands and kidneys are normal bilaterally. The ureters and urinary bladder are within normal limits. Urinary bladder is mildly distended. The stomach and duodenum are within normal limits. Small bowel is unremarkable. The appendix is visualized and normal. The ascending and transverse colon are within normal limits. The descending and rectosigmoid colon are  unremarkable. The uterus is mildly enlarged, without significant interval change. The adnexa are within normal limits bilaterally. No significant adenopathy is present. The axial skeleton is within normal limits. Pelvis is intact. Degenerative changes are noted at the SI joints bilaterally. Review of the MIP images confirms the above findings. IMPRESSION: 1. No evidence for aortic dissection or vascular injury. 2. Mild atherosclerotic changes within the abdominal aorta and branch vessels without aneurysm. 3. No significant adenopathy or evidence for recurrent lymphoma. 4. Stable mild splenomegaly. 5. 5 mm nodule in the right lower lobe demonstrates interval increase in size. No follow-up needed if patient is low-risk. Non-contrast chest CT can be considered in 12 months if patient is high-risk. This recommendation follows the consensus statement: Guidelines for Management of Incidental Pulmonary Nodules Detected on CT Images:From the Fleischner Society 2017; published online before print (10.1148/radiol.IJ:2314499). 6. Degenerate changes at the SI joints bilaterally. Electronically Signed   By: San Morelle M.D.   On: 04/08/2016 13:39   US Abdomen Limited Ruq  04/08/2016  CLINICAL DATA:  Chest pain syndrome EXAM: US ABDOMEN LIMITED - RIGHT UPPER QUADRANT COMPARISON:  Abdominal CT 12/13/2010 FINDINGS: Gallbladder: No gallstones or wall thickening visualized. No sonographic Murphy sign noted by sonographer. Common bile duct: Diameter: 3 mm.  Where visualized, no filling defect. Liver: Diffusely echogenic with poor acoustic penetration, limiting the sensitivity of sonography. Antegrade flow in the imaged portal venous system. No evidence of mass. IMPRESSION: 1. Hepatic steatosis. 2. Negative gallbladder. Electronically Signed   By: Monte Fantasia M.D.   On: 04/08/2016 15:59    ASSESSMENT AND PLAN:     1. Chest pain  - has atypical and atypical chest pain. Chest pain somewhat improved with Pepto-Bismol  and not with nitroglycerin. Worsening with food and laying down. No history of GERD. She is also tender to palpation at epigastric area. However she has a separate constant achy pain at left upper chest. Troponin is trending up. EKG without acute changes on admission. Her cardiac risk factor includes uncontrolled diabetes, hyperlipidemia, hypertension and prior history of smoking. - Will continue to trend troponin. Repeat EKG. She is already scheduled for stress test (2 days) and echocardiogram.  -Troponin further increased. EKG without  acute changes. Start BB, IV nitro and Iv heaprin per pharmacy. For cath later today. MD to review statin.   2. Palpitation - Denies recent episode of palpitation. Upon presentation EKG shows sinus tachycardia at rate of 120 bpm. Telemetry shows rate in 110's. Will get TSH. Start on metoprolol 25 mg twice a day.  3. Diabetes mellitus type 2, uncontrolled (HCC) - A1c of 12.7. Per primary  4. HLD - 04/09/2016: Cholesterol 186; HDL 30*; LDL Cholesterol 125*; Triglycerides 155*; VLDL 31  - LFT normal. She had leg cramps on lovastatin. Will discuss with patient. She will benefit from statin given diabetes and possible ACS.   5. HTN - Stable. Continue ARB. BB added.    Splenic marginal zone b-cell lymphoma (HCC)  Anemia in neoplastic disease  Fatty liver disease, nonalcoholic  Pulmonary nodule, right    Signed: Bhagat,Bhavinkumar, PA 04/09/2016, 8:12 AM Pager [phone]  Co-Sign MD  Patient seen and examined. Agree with assessment and plan. Ms Mayling Barrio is a 79 -year-old African-American female who has a history of poorly controlled diabetes mellitus, remote B cell lymphoma status post chemotherapy, hypertension and hyperlipidemia.  In the past.  She developed significant myalgias with lovastatin.  Her mother suffered a large myocardial infarction in February and the patient has been under significant increased stress, caring for her mother.  She lives  in [place] and works in [place] in her typical day begins at 5 AM and ends at 37 PM.  There is remote tobacco history.  She recently had developed left-sided chest discomfort.  On Monday evening she had a significant episode which lasted through the night.  She continued to have mild recurrent chest pain yesterday and was admitted for evaluation.  A CT MG of her chest, abdomen and pelvis did not show evidence for aortic dissection and revealed mild splenomegaly and 5 mm right lower lobe nodules.  She was found to have hepatic steatosis.  ECG initially revealed sinus tachycardia with T-wave inversion in 3.  She has continued to experience a mild ache.  Her initial troponin was 0.03 and these have subsequently increased to 0.14, 0.45, and 0.82.  On exam, her blood pressure was 135/82.  Initially, she was sinus tachycardic but now heart rate is in the 90s.  HEENT is unremarkable.  On potty scale is 3.  She did not have carotid bruits.  Lungs were clear.  Rhythm was regular with no chest wall tenderness.  There was a faint 1/6 systolic murmur.  She had moderate obesity.  Bowel sounds are positive for quadrants.  She had trace to 1+ bilateral lower extremity edema.  Neurologic exam is grossly nonfocal.  Her subsequent ECG shows normal sinus rhythm at 99 bpm.  There is T-wave inversion in lead 3.  Laboratory is also notable for microcytic indices with an MCV of 72.  She had significant glucose elevation , initially at 452.  Hemoglobin A1c is increased at 12.7.  Lipid studies reveal an LDL cholesterol of 125 and a low HDL cholesterol of 30 with triglycerides at 155 and total cholesterol 186.  I long discussion with the patient.  The patient has significant cardial vascular comorbidities and has experienced several days of chest pain with upper trending mildly positive troponin levels.  It is my recommendation that definitive cardiac catheterization be performed by the patient.  I discussed the procedure in detail with  the patient including risks/benefits as well as potential need for percutaneous coronary intervention with stenting.  A light of  her upward trending troponins will schedule the cardiac catheterization to be done sooner rather than later today.   Troy Sine, MD, [facility] 04/09/2016 10:27 AM

## 2016-04-09 NOTE — Progress Notes (Signed)
TR BAND REMOVAL  LOCATION:    right radial  DEFLATED PER PROTOCOL:    Yes.    TIME BAND OFF / DRESSING APPLIED:    1745   SITE UPON ARRIVAL:    Level 0  SITE AFTER BAND REMOVAL:    Level 0  CIRCULATION SENSATION AND MOVEMENT:    Within Normal Limits   Yes.    COMMENTS:   Tolerated procedure well 

## 2016-04-09 NOTE — Plan of Care (Signed)
Problem: Consults Goal: Cardiac Cath Patient Education (See Patient Education module for education specifics.) Outcome: Completed/Met Date Met:  04/09/16 Pt educated on cardiac catheterization with possible percutaneous coronary intervention. Consent signed. Ekg complete. Denies wanting to watch video. No further questions at this time.

## 2016-04-09 NOTE — Consult Note (Signed)
CARDIOLOGY CONSULT NOTE   Patient ID: Desiree [name] MRN: [id number] DOB/AGE: Oct 02, 1966 50 y.o.  Admit date: 04/08/2016  Primary Physician   Rogers Blocker, MD Primary Cardiologist   New Reason for Consultation  Chest pain Requesting Physician  Dr. Erlinda Hong  HPI: Desiree [name] is a 50 y.o. 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    The patient is in significant stress since February 2017 after her mother had MI (at age 28). She is in nursing facility [place], [place]. She lives there but comes to [facility] for work. He drives back and forth every day and frequently is outside while driving.  Monday evening around 6 PM he had a episode of left-sided upper chest pain while sitting. Felt like indigestion and worsened with supine position. Minimally improvement with weaning and water solution. Her discomfort lasted all night and she went to  work on Tuesday where after taking biscuit her symptoms reoccurred. Minimal improvement again with the Pepto-Bismol. This pain is more severe and worse. She also had a aching sensation to her epigastric area and radiation to her left sided chest. Also complaining of radiation to her back. Questionable exacerbation of pain with exertion. Denies shortness of breath, nausea, vomiting or diaphoresis. She takes when necessary Lasix for intermittent lower extremity edema. Denies orthopnea, PND, syncope, melena or blood in her stool or urine. She used to smoke half a pack a day for 10 years and quit 2006. Never seen by any cardiologist. She continues to have 4 out of 10 achiness over her left chest. No recent palpitations.   ECG on admission shows sinus tachycardia at rate of 120 bpm. No acute ST or T wave changes. Chest x-ray clear. Lytes normal. A1c of 12.7. LDL 125.  Elevated blood glucose. CT angiogram of chest and abdominal and pelvis showed no evidence of aortic dissection, mild splenomegaly, 5 mm right lower lobe nodules. Right upper quadrant ultrasound showed hepatic steatosis stenosis. Liver profile normal. Troponin 0.0.-->0.14-->0.45.   Past Medical History  Diagnosis Date  . Leukocytosis 07/27/2011  . Hypertension   . Iron deficiency anemia due to chronic blood loss   . Fatigue 11/17/2013  . 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 Diabetes mellitus without complication ([place])   . Splenic marginal zone b-cell lymphoma ([place]) 01/2009    On observation  . Non Hodgkin's lymphoma ([place])     recieving chemo 06/2014  . Palpitation 06/30/2014  . Tachycardia with 100 - 120 beats per minute 11/29/2014     Past Surgical History  Procedure Laterality Date  . Wisdom tooth extraction      Allergies  Allergen Reactions  . Erythromycin Anaphylaxis    Swelling and difficulty breathing.  . Lovastatin     Leg cramps  . Tolnaftate Hives    Reaction caused by pills and the cream.    I have reviewed the patient's current medications . aspirin EC  325 mg Oral Daily  . enoxaparin (LOVENOX) injection  40 mg Subcutaneous Q24H  . insulin aspart  0-15 Units Subcutaneous TID WC  . insulin aspart  0-5 Units Subcutaneous QHS  . losartan  100 mg Oral Daily  . metoprolol tartrate  25 mg Oral BID  . multivitamin-prenatal  1 tablet Oral Q1200  . omega-3 acid ethyl esters  1 g Oral BID  .  pantoprazole  40 mg Oral Daily  . sodium chloride flush  10 mL Intravenous Q12H     acetaminophen, gi cocktail, morphine injection, nitroGLYCERIN, ondansetron (ZOFRAN) IV, sodium chloride flush  Prior to Admission medications   Medication Sig Start Date End Date Taking? Authorizing Provider  acetaminophen (TYLENOL) 500 MG tablet Take 1,000 mg by mouth every 8 (eight) hours as needed (pain).   Yes Historical Provider, MD  calcium & magnesium carbonates (MYLANTA) 311-232 MG tablet  Take 1 tablet by mouth once.   Yes Historical Provider, MD  fish oil-omega-3 fatty acids 1000 MG capsule Take 1 g by mouth 2 (two) times daily.    Yes Historical Provider, MD  furosemide (LASIX) 40 MG tablet Take 40 mg by mouth daily as needed (leg swelling).   Yes Historical Provider, MD  losartan (COZAAR) 100 MG tablet Take 100 mg by mouth daily. 12/10/15  Yes Historical Provider, MD  metFORMIN (GLUCOPHAGE) 1000 MG tablet Take 1,000 mg by mouth 2 (two) times daily with a meal.     Yes Historical Provider, MD  Prenatal Vit-Fe Fumarate-FA (PRENATAL PO) Take 1 tablet by mouth daily.   Yes Historical Provider, MD  valACYclovir (VALTREX) 500 MG tablet Take 500 mg by mouth 2 (two) times daily as needed. Takes for outbreaks only. 05/02/14   Historical Provider, MD     Social History   Social History  . Marital Status: Married    Spouse Name: N/A  . Number of Children: N/A  . Years of Education: N/A   Occupational History  . Not on file.   Social History Main Topics  . Smoking status: Former Smoker    Quit date: 10/13/2005  . Smokeless tobacco: Never Used  . Alcohol Use: No  . Drug Use: No  . Sexual Activity: Yes    Birth Control/ Protection: None   Other Topics Concern  . Not on file   Social History Narrative    No family status information on file.   Family History  Problem Relation Age of Onset  . Cancer Maternal Aunt     breast ca  . Cancer Maternal Uncle     lung ca      ROS:  Full 14 point review of systems complete and found to be negative unless listed above.  Physical Exam: Blood pressure 135/85, pulse 112, temperature 97.9 F (36.6 C), temperature source Oral, resp. rate 22, height 5\' 9"  (1.753 m), weight 270 lb 8 oz (122.698 kg), last menstrual period 11/09/2015, SpO2 97 %.  General: Well developed, well nourished, female in no acute distress Head: Eyes PERRLA, No xanthomas. Normocephalic and atraumatic, oropharynx without edema or exudate.  Lungs: Resp regular  and unlabored, CTA. Tender to palpation in epigastric area.  Heart: RRR no s3, s4, or murmurs..   Neck: No carotid bruits. No lymphadenopathy.  No JVD. Abdomen: Bowel sounds present, abdomen soft and non-tender without masses or hernias noted. Msk:  No spine or cva tenderness. No weakness, no joint deformities or effusions. Extremities: No clubbing, cyanosis or edema. DP/PT/Radials 2+ and equal bilaterally. Neuro: Alert and oriented X 3. No focal deficits noted. Psych:  Good affect, responds appropriately Skin: No rashes or lesions noted.  Labs:   Lab Results  Component Value Date   WBC 9.2 04/08/2016   HGB 13.9 04/08/2016   HCT 43.1 04/08/2016   MCV 72.8* 04/08/2016   PLT 202 04/08/2016   No results for input(s): INR in the last 72 hours.  Recent Labs  Lab 04/08/16 1214 04/08/16 1529  NA 133*  --   K 4.1  --   CL 101  --   CO2 21*  --   BUN 8  --   CREATININE 0.62  --   CALCIUM 9.4  --   PROT  --  6.5  BILITOT  --  0.8  ALKPHOS  --  93  ALT  --  26  AST  --  20  GLUCOSE 452*  --   ALBUMIN  --  3.6   No results found for: MG  Recent Labs  04/08/16 1529 04/08/16 2100 04/09/16 0227  TROPONINI 0.03* 0.14* 0.45*    Recent Labs  04/08/16 1221  TROPIPOC 0.00   No results found for: PROBNP Lab Results  Component Value Date   CHOL 186 04/09/2016   HDL 30* 04/09/2016   LDLCALC 125* 04/09/2016   TRIG 155* 04/09/2016   No results found for: DDIMER LIPASE  Date/Time Value Ref Range Status  04/08/2016 03:29 PM 25 11 - 51 U/L Final    Echo: 01/2009 Left ventricle: The cavity size was normal. Wall thickness was normal. Systolic function was normal. Wall motion was normal; there were no regional wall motion abnormalities. Wall motion score: 1.00. Aortic valve: Trileaflet; normal thickness leaflets. Mobility was not restricted. Doppler: Transvalvular velocity was within the normal range. There was no stenosis. No regurgitation. Aorta: Aortic root: The aortic  root was normal in size. Mitral valve: Structurally normal valve. Mobility was not restricted. Doppler: Transvalvular velocity was within the normal range. There was no evidence for stenosis. Trivial regurgitation. Peak gradient: 16mm Hg (D). Left atrium: The atrium was mildly dilated. Right ventricle: The cavity size was normal. Systolic function was normal. Pulmonic valve: Doppler: Transvalvular velocity was within the normal range. There was no evidence for stenosis. Tricuspid valve: Structurally normal valve. Doppler: Transvalvular velocity was within the normal range. Trivial regurgitation. Pulmonary artery: The main pulmonary artery was normal-sized. Systolic pressure was within the normal range. Pericardium: There was no pericardial effusion. Systemic veins: Inferior vena cava: The vessel was normal in size.  ECG:  Sinus tachycardia at rate of 120 bpm  Radiology:  Dg Chest 2 View  04/08/2016  CLINICAL DATA:  Chest pain starting yesterday EXAM: CHEST  2 VIEW COMPARISON:  08/18/2014 PET scan FINDINGS: Cardiomediastinal silhouette is stable. Cardiomediastinal silhouette is unremarkable. No acute infiltrate or pleural effusion. No pulmonary edema. Bony thorax is unremarkable. IMPRESSION: No active cardiopulmonary disease. Electronically Signed   By: Lahoma Crocker M.D.   On: 04/08/2016 12:35   Ct Angio Chest/abd/pel For Dissection W And/or W/wo  04/08/2016  CLINICAL DATA:  New onset of chest pain beginning last evening with progression since appearing pain radiates into her back. Associated nausea. Pain is increased with exertion. Personal history of lymphoma. EXAM: CT ANGIOGRAPHY CHEST, ABDOMEN AND PELVIS TECHNIQUE: Multidetector CT imaging through the chest, abdomen and pelvis was performed using the standard protocol during bolus administration of intravenous contrast. Multiplanar reconstructed images and MIPs were obtained and reviewed to evaluate the vascular anatomy. CONTRAST:  100 mL  Isovue 370 COMPARISON:  PET scan on 08/18/2014 FINDINGS: CTA CHEST FINDINGS New the ascending aorta is within normal limits. There no focal calcifications or intramural hemorrhage. The aortic arch is unremarkable. A 3 vessel arch configuration is present. The descending thoracic aorta is within normal limits. Pulmonary arteries are normal. Heart size is normal. There is no significant pericardial effusion. No significant mediastinal or axillary adenopathy is present. The  thoracic inlet demonstrates stable appearance of left thyroid nodule measuring 2.5 x 1.9 x 3.0 cm. A 5 mm nodule at the right lung base has slightly increased in size. No other focal nodule, mass, or airspace disease is present. Minimal dependent atelectasis is present bilaterally. Bone windows are unremarkable. Vertebral body heights alignment are maintained. The ribs are within normal limits. Review of the MIP images confirms the above findings. CTA ABDOMEN AND PELVIS FINDINGS The abdominal aorta is within normal limits. Minimal calcifications are present. There is no aneurysm or evidence for dissection. The celiac artery and superior mesenteric artery are within normal limits. The inferior mesenteric artery is identified and normal. Both renal arteries are within normal limits. The aortic bifurcation is unremarkable there are calcifications are present within the internal iliac arteries bilaterally. The liver is unremarkable. The common bile duct and gallbladder are normal. The spleen is mildly enlarged without significant interval change. The pancreas is within normal limits. The adrenal glands and kidneys are normal bilaterally. The ureters and urinary bladder are within normal limits. Urinary bladder is mildly distended. The stomach and duodenum are within normal limits. Small bowel is unremarkable. The appendix is visualized and normal. The ascending and transverse colon are within normal limits. The descending and rectosigmoid colon are  unremarkable. The uterus is mildly enlarged, without significant interval change. The adnexa are within normal limits bilaterally. No significant adenopathy is present. The axial skeleton is within normal limits. Pelvis is intact. Degenerative changes are noted at the SI joints bilaterally. Review of the MIP images confirms the above findings. IMPRESSION: 1. No evidence for aortic dissection or vascular injury. 2. Mild atherosclerotic changes within the abdominal aorta and branch vessels without aneurysm. 3. No significant adenopathy or evidence for recurrent lymphoma. 4. Stable mild splenomegaly. 5. 5 mm nodule in the right lower lobe demonstrates interval increase in size. No follow-up needed if patient is low-risk. Non-contrast chest CT can be considered in 12 months if patient is high-risk. This recommendation follows the consensus statement: Guidelines for Management of Incidental Pulmonary Nodules Detected on CT Images:From the Fleischner Society 2017; published online before print (10.1148/radiol.SG:5268862). 6. Degenerate changes at the SI joints bilaterally. Electronically Signed   By: San Morelle M.D.   On: 04/08/2016 13:39   US Abdomen Limited Ruq  04/08/2016  CLINICAL DATA:  Chest pain syndrome EXAM: US ABDOMEN LIMITED - RIGHT UPPER QUADRANT COMPARISON:  Abdominal CT 12/13/2010 FINDINGS: Gallbladder: No gallstones or wall thickening visualized. No sonographic Murphy sign noted by sonographer. Common bile duct: Diameter: 3 mm.  Where visualized, no filling defect. Liver: Diffusely echogenic with poor acoustic penetration, limiting the sensitivity of sonography. Antegrade flow in the imaged portal venous system. No evidence of mass. IMPRESSION: 1. Hepatic steatosis. 2. Negative gallbladder. Electronically Signed   By: Monte Fantasia M.D.   On: 04/08/2016 15:59    ASSESSMENT AND PLAN:     1. Chest pain  - has atypical and atypical chest pain. Chest pain somewhat improved with Pepto-Bismol  and not with nitroglycerin. Worsening with food and laying down. No history of GERD. She is also tender to palpation at epigastric area. However she has a separate constant achy pain at left upper chest. Troponin is trending up. EKG without acute changes on admission. Her cardiac risk factor includes uncontrolled diabetes, hyperlipidemia, hypertension and prior history of smoking. - Will continue to trend troponin. Repeat EKG. She is already scheduled for stress test (2 days) and echocardiogram.  -Troponin further increased. EKG without  acute changes. Start BB, IV nitro and Iv heaprin per pharmacy. For cath later today. MD to review statin.   2. Palpitation - Denies recent episode of palpitation. Upon presentation EKG shows sinus tachycardia at rate of 120 bpm. Telemetry shows rate in 110's. Will get TSH. Start on metoprolol 25 mg twice a day.  3. Diabetes mellitus type 2, uncontrolled (HCC) - A1c of 12.7. Per primary  4. HLD - 04/09/2016: Cholesterol 186; HDL 30*; LDL Cholesterol 125*; Triglycerides 155*; VLDL 31  - LFT normal. She had leg cramps on lovastatin. Will discuss with patient. She will benefit from statin given diabetes and possible ACS.   5. HTN - Stable. Continue ARB. BB added.    Splenic marginal zone b-cell lymphoma (HCC)  Anemia in neoplastic disease  Fatty liver disease, nonalcoholic  Pulmonary nodule, right    Signed: Bhagat,Bhavinkumar, PA 04/09/2016, 8:12 AM Pager [phone]  Co-Sign MD  Patient seen and examined. Agree with assessment and plan. Ms Desiree [name] is a 59 -year-old African-American female who has a history of poorly controlled diabetes mellitus, remote B cell lymphoma status post chemotherapy, hypertension and hyperlipidemia.  In the past.  She developed significant myalgias with lovastatin.  Her mother suffered a large myocardial infarction in February and the patient has been under significant increased stress, caring for her mother.  She lives  in [place] and works in [place] in her typical day begins at 5 AM and ends at 76 PM.  There is remote tobacco history.  She recently had developed left-sided chest discomfort.  On Monday evening she had a significant episode which lasted through the night.  She continued to have mild recurrent chest pain yesterday and was admitted for evaluation.  A CT MG of her chest, abdomen and pelvis did not show evidence for aortic dissection and revealed mild splenomegaly and 5 mm right lower lobe nodules.  She was found to have hepatic steatosis.  ECG initially revealed sinus tachycardia with T-wave inversion in 3.  She has continued to experience a mild ache.  Her initial troponin was 0.03 and these have subsequently increased to 0.14, 0.45, and 0.82.  On exam, her blood pressure was 135/82.  Initially, she was sinus tachycardic but now heart rate is in the 90s.  HEENT is unremarkable.  On potty scale is 3.  She did not have carotid bruits.  Lungs were clear.  Rhythm was regular with no chest wall tenderness.  There was a faint 1/6 systolic murmur.  She had moderate obesity.  Bowel sounds are positive for quadrants.  She had trace to 1+ bilateral lower extremity edema.  Neurologic exam is grossly nonfocal.  Her subsequent ECG shows normal sinus rhythm at 99 bpm.  There is T-wave inversion in lead 3.  Laboratory is also notable for microcytic indices with an MCV of 72.  She had significant glucose elevation , initially at 452.  Hemoglobin A1c is increased at 12.7.  Lipid studies reveal an LDL cholesterol of 125 and a low HDL cholesterol of 30 with triglycerides at 155 and total cholesterol 186.  I long discussion with the patient.  The patient has significant cardial vascular comorbidities and has experienced several days of chest pain with upper trending mildly positive troponin levels.  It is my recommendation that definitive cardiac catheterization be performed by the patient.  I discussed the procedure in detail with  the patient including risks/benefits as well as potential need for percutaneous coronary intervention with stenting.  A light of  her upward trending troponins will schedule the cardiac catheterization to be done sooner rather than later today.   Troy Sine, MD, [facility] 04/09/2016 10:27 AM

## 2016-04-09 NOTE — Interval H&P Note (Signed)
History and Physical Interval Note:  04/09/2016 1:01 PM  Desiree [name]  has presented today for surgery, with the diagnosis of elevated tripo  The various methods of treatment have been discussed with the patient and family. After consideration of risks, benefits and other options for treatment, the patient has consented to  Procedure(s): Left Heart Cath and Coronary Angiography (N/A) as a surgical intervention .  The patient's history has been reviewed, patient examined, no change in status, stable for surgery.  I have reviewed the patient's chart and labs.  Questions were answered to the patient's satisfaction.   Cath Lab Visit (complete for each Cath Lab visit)  Clinical Evaluation Leading to the Procedure:   ACS: Yes.    Non-ACS:    Anginal Classification: CCS IV  Anti-ischemic medical therapy: Minimal Therapy (1 class of medications)  Non-Invasive Test Results: No non-invasive testing performed  Prior CABG: No previous CABG        [facility] 04/09/2016 1:01 PM

## 2016-04-09 NOTE — Progress Notes (Signed)
Nutrition Education Note  RD consulted for nutrition education regarding a Heart Healthy and CHO modified diet. Patient reports that she knows what to do, she just hasn't been doing it. She has been on the go caring for her mother in [place] and working in [place]. She is willing and prepared to make dietary changes to "take care of myself."    Lipid Panel     Component Value Date/Time   CHOL 186 04/09/2016 0227   TRIG 155* 04/09/2016 0227   HDL 30* 04/09/2016 0227   CHOLHDL 6.2 04/09/2016 0227   VLDL 31 04/09/2016 0227   LDLCALC 125* 04/09/2016 0227    Lab Results  Component Value Date   HGBA1C 12.7* 04/08/2016    RD provided "Heart Healthy Nutrition Therapy," "Carbohydrate Counting for People with Diabetes," and "Healthy Eating on The Go" handouts from the Academy of Nutrition and Dietetics. Reviewed patient's dietary recall. Provided examples on ways to decrease sodium and fat intake in diet. Discouraged intake of processed foods and use of salt shaker. Encouraged fresh fruits and vegetables as well as whole grain sources of carbohydrates to maximize fiber intake.   Discussed different food groups and their effects on blood sugar, emphasizing carbohydrate-containing foods. Provided list of carbohydrates and recommended serving sizes of common foods. Discussed importance of controlled and consistent carbohydrate intake throughout the day. Provided examples of ways to balance meals/snacks and encouraged intake of high-fiber, whole grain complex carbohydrates.  Teach back method used.  Expect good compliance.  Body mass index is 39.93 kg/(m^2). Pt meets criteria for obesity based on current BMI.  Current diet order is NPO for cardiac cath today. Labs and medications reviewed. No further nutrition interventions warranted at this time. RD contact information provided. If additional nutrition issues arise, please re-consult RD.  Molli Barrows, RD, LDN, [place] Pager [phone] After Hours  Pager [phone]

## 2016-04-09 NOTE — Progress Notes (Signed)
Inpatient Diabetes Program Recommendations  AACE/ADA: New Consensus Statement on Inpatient Glycemic Control (2015)  Target Ranges:  Prepandial:   less than 140 mg/dL      Peak postprandial:   less than 180 mg/dL (1-2 hours)      Critically ill patients:  140 - 180 mg/dL   Results for Desiree [name], Desiree [name] (MRN [id number]) as of 04/09/2016 07:35  Ref. Range 04/08/2016 15:29  Hemoglobin A1C Latest Ref Range: 4.8-5.6 % 12.7 (H)   Results for Desiree [name], Desiree [name] (MRN [id number]) as of 04/09/2016 07:35  Ref. Range 04/08/2016 16:49 04/08/2016 19:52  Glucose-Capillary Latest Ref Range: 65-99 mg/dL 313 (H) 236 (H)    Admit with: CP  History: DM2  Home DM Meds: Metformin 1000 mg bid  Current Insulin Orders: Novolog Moderate Correction Scale/ SSI (0-15 units) TID AC + HS      -Current A1c 12.7%.  Poor glucose control at home.  -Received referral from MD that patient may require insulin at time of d/c.  -Spoke with patient about her current A1c of 12.7%.  Explained what an A1c is and what it measures.  Reminded patient that her goal A1c is 7% or less per ADA standards to prevent both acute and long-term complications.  Explained to patient the extreme importance of good glucose control at home.  Encouraged patient to check her CBGs at least bid at home (fasting and another check within the day) and to record all CBGs in a logbook for her PCP to review.  -Patient told me she has been extremely busy and under a lot of stress at home.  Lives in [place] but works here in [place].  Patient stated to me that she has not been taking care of herself that well over the last several months.  Works all day in [place] and then drives back to [place] and goes to the SNF where her mother is to feed her mother dinner everyday.  Per patient, her mother is dying and she wants to be with her as much as possible before she passes away.  Has not been eating a healthy diet b/c she is always on the road.  Eats a  lot of fast food.  Patient stated to me that she knows what she needs to do to get her CBGs under control, she just needs to take the time to do it.  Patient also stated she has not been checking her CBGs at all.  Has CBG meter at home.  Encouraged patient to check her CBGs more often and to record for her PCP.  -Discussed with patient that the MD may want to send her home on insulin.  Discussed with patient the different insulins available.  Explained the difference between basal insulin and rapid-acting insulin and how they work differently.  Stated to patient that the MD may want to try and send her home on her home Metformin plus 1 shot of basal insulin per day.  Patient stated she would be open to taking one shot of insulin per day if necessary.  Would prefer insulin pens over vial and syringe.  -Educated patient on insulin pen use at home.  Reviewed all steps of insulin pen including attachment of needle, 2-unit air shot, dialing up dose, giving injection, removing needle, disposal of sharps, storage of unused insulin, disposal of insulin etc.  Patient able to provide successful return demonstration.  Reviewed troubleshooting with insulin pen.  Have asked RNs caring for patient to please allow patient to give all injections  here in hospital as much as possible for practice.  MD to give patient Rxs for insulin pens and insulin pen needles.       MD- If you decide to send patient home on insulin, please consider the following:  1. Start Lantus 18 units QHS (0.15 units/kg dosing)  2. Continue Metformin 1000 mg bid  3. Give patient Rxs for Lantus insulin pen and Insulin pen needles:  Lantus insulin pen- Order [phone]  Insulin Pen Needles- Order [phone]    --Will follow patient during hospitalization--  Wyn Quaker RN, MSN, CDE Diabetes Coordinator Inpatient Glycemic Control Team Team Pager: [phone] (8a-5p)

## 2016-04-09 NOTE — Progress Notes (Signed)
ANTICOAGULATION CONSULT NOTE - Initial Consult  Pharmacy Consult:  Heparin Indication: chest pain/ACS  Allergies  Allergen Reactions  . Erythromycin Anaphylaxis    Swelling and difficulty breathing.  . Lovastatin     Leg cramps  . Tolnaftate Hives    Reaction caused by pills and the cream.    Patient Measurements: Height: 5\' 9"  (175.3 cm) Weight: 270 lb 8 oz (122.698 kg) IBW/kg (Calculated) : 66.2 Heparin Dosing Weight: 95 kg  Vital Signs: Temp: 97.7 F (36.5 C) (06/28 0855) Temp Source: Oral (06/28 0855) BP: 135/85 mmHg (06/28 0804) Pulse Rate: 86 (06/28 0855)  Labs:  Recent Labs  04/08/16 1214  04/08/16 2100 04/09/16 0227 04/09/16 0818  HGB 13.9  --   --   --   --   HCT 43.1  --   --   --   --   PLT 202  --   --   --   --   CREATININE 0.62  --   --   --   --   TROPONINI  --   < > 0.14* 0.45* 0.82*  < > = values in this interval not displayed.  Estimated Creatinine Clearance: 119.2 mL/min (by C-G formula based on Cr of 0.62).   Medical History: Past Medical History  Diagnosis Date  . Leukocytosis 07/27/2011  . Hypertension   . Iron deficiency anemia due to chronic blood loss   . Fatigue 11/17/2013  . 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 Diabetes mellitus without complication ([place])   . 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 Non Hodgkin's lymphoma ([place])     recieving chemo 06/2014  . Palpitation 06/30/2014  . Tachycardia with 100 - 120 beats per minute 11/29/2014      Assessment: 50 YOF with NSTEMI to start IV heparin.  Patient received SQ Lovenox yesterday PM.  Baseline labs reviewed.   Goal of Therapy:  Heparin level 0.3-0.7 units/ml Monitor platelets by anticoagulation protocol: Yes    Plan:  - D/C SQ Lovenox - Heparin 4000 units IV bolus, then - Heparin gtt at 1350 units/hr - Check 6 hr HL - Daily HL / CBC    Tanaysia Bhardwaj D. Mina Marble, PharmD, BCPS Pager:  [phone] 04/09/2016, 9:35 AM

## 2016-04-09 NOTE — Progress Notes (Addendum)
PROGRESS NOTE  Desiree [name] [id number] DOB: [birth year] DOA: 04/08/2016 PCP: Rogers Blocker, MD  HPI/Recap of past 24 hours:  C/o chest pain with dyspnea started on Monday  Reported chronic bilateral lower extremity edema for the last year, no edema today  Assessment/Plan: Principal Problem:   Chest pain syndrome Active Problems:   Diabetes mellitus type 2, uncontrolled (HCC)   Splenic marginal zone b-cell lymphoma (HCC)   Hypertension   Anemia in neoplastic disease   Tachycardia   Fatty liver disease, nonalcoholic   Dyslipidemia   Pulmonary nodule, right   Pain in the chest   Diabetes mellitus with complication (HCC)  Chest pain with troponin elevation,  initial EKG no ST/T. CTA  Chest no dissection, no PE.  With positive family history of CAD, uncontrolled dm2  and hld ( ldl 125),  Patient has persistent chest pain, troponin trending up, last set 0.45, continue cycle troponin,  she is on asa, betablocker, she is allergic to statin, on lovaza, echo pending, will defer heparin drip to cardiology, appreciate cardiology input.   noninsulin dependent dm2,  previously only on metformin a1c 12.8 in 2008, repeat a1c this hospitalization 12.7, will discuss with patient about starting on insulin Metformin held since admission, on ssi here Diabetes education   HLD; ldl 125, allergic to statin, on lovaza, consider zetia  HTN;  bp stable on betablocker and acei  H/o splenic marginal zone b cell lymphoma treated witn weekly rituximab and iv dexamethasone in 2015, in remission, iron deficiency anemia and microcytosis Continue outpatient hematology oncology follow up  Pulmonary nodule, right -Noted 5 mm nodule with slight increase in size since previous CT and a nonsmoker -Radiology recommends repeating noncontrast CT in 12 months  Code Status: full  Family Communication: patient   Disposition Plan: remain in the  hospital   Consultants:  cardiology  Procedures:  none  Antibiotics:  none   Objective: BP 135/85 mmHg  Pulse 112  Temp(Src) 97.9 F (36.6 C) (Oral)  Resp 22  Ht 5\' 9"  (1.753 m)  Wt 122.698 kg (270 lb 8 oz)  BMI 39.93 kg/m2  SpO2 97%  LMP 11/09/2015  Intake/Output Summary (Last 24 hours) at 04/09/16 0819 Last data filed at 04/08/16 1600  Gross per 24 hour  Intake    240 ml  Output      0 ml  Net    240 ml   Filed Weights   04/08/16 1600 04/09/16 0300  Weight: 122.426 kg (269 lb 14.4 oz) 122.698 kg (270 lb 8 oz)    Exam:   General:  NAD  Cardiovascular: RRR  Respiratory: CTABL  Abdomen: Soft/ND/NT, positive BS  Musculoskeletal: No Edema  Neuro: aaox3  Data Reviewed: Basic Metabolic Panel:  Recent Labs Lab 04/08/16 1214  NA 133*  K 4.1  CL 101  CO2 21*  GLUCOSE 452*  BUN 8  CREATININE 0.62  CALCIUM 9.4   Liver Function Tests:  Recent Labs Lab 04/08/16 1529  AST 20  ALT 26  ALKPHOS 93  BILITOT 0.8  PROT 6.5  ALBUMIN 3.6    Recent Labs Lab 04/08/16 1529  LIPASE 25   No results for input(s): AMMONIA in the last 168 hours. CBC:  Recent Labs Lab 04/08/16 1214  WBC 9.2  HGB 13.9  HCT 43.1  MCV 72.8*  PLT 202   Cardiac Enzymes:    Recent Labs Lab 04/08/16 1529 04/08/16 2100 04/09/16 0227  TROPONINI 0.03* 0.14* 0.45*   BNP (last 3  results) No results for input(s): BNP in the last 8760 hours.  ProBNP (last 3 results) No results for input(s): PROBNP in the last 8760 hours.  CBG:  Recent Labs Lab 04/08/16 1649 04/08/16 1952  GLUCAP 313* 236*    No results found for this or any previous visit (from the past 240 hour(s)).   Studies: Dg Chest 2 View  04/08/2016  CLINICAL DATA:  Chest pain starting yesterday EXAM: CHEST  2 VIEW COMPARISON:  08/18/2014 PET scan FINDINGS: Cardiomediastinal silhouette is stable. Cardiomediastinal silhouette is unremarkable. No acute infiltrate or pleural effusion. No pulmonary  edema. Bony thorax is unremarkable. IMPRESSION: No active cardiopulmonary disease. Electronically Signed   By: Lahoma Crocker M.D.   On: 04/08/2016 12:35   Ct Angio Chest/abd/pel For Dissection W And/or W/wo  04/08/2016  CLINICAL DATA:  New onset of chest pain beginning last evening with progression since appearing pain radiates into her back. Associated nausea. Pain is increased with exertion. Personal history of lymphoma. EXAM: CT ANGIOGRAPHY CHEST, ABDOMEN AND PELVIS TECHNIQUE: Multidetector CT imaging through the chest, abdomen and pelvis was performed using the standard protocol during bolus administration of intravenous contrast. Multiplanar reconstructed images and MIPs were obtained and reviewed to evaluate the vascular anatomy. CONTRAST:  100 mL Isovue 370 COMPARISON:  PET scan on 08/18/2014 FINDINGS: CTA CHEST FINDINGS New the ascending aorta is within normal limits. There no focal calcifications or intramural hemorrhage. The aortic arch is unremarkable. A 3 vessel arch configuration is present. The descending thoracic aorta is within normal limits. Pulmonary arteries are normal. Heart size is normal. There is no significant pericardial effusion. No significant mediastinal or axillary adenopathy is present. The thoracic inlet demonstrates stable appearance of left thyroid nodule measuring 2.5 x 1.9 x 3.0 cm. A 5 mm nodule at the right lung base has slightly increased in size. No other focal nodule, mass, or airspace disease is present. Minimal dependent atelectasis is present bilaterally. Bone windows are unremarkable. Vertebral body heights alignment are maintained. The ribs are within normal limits. Review of the MIP images confirms the above findings. CTA ABDOMEN AND PELVIS FINDINGS The abdominal aorta is within normal limits. Minimal calcifications are present. There is no aneurysm or evidence for dissection. The celiac artery and superior mesenteric artery are within normal limits. The inferior  mesenteric artery is identified and normal. Both renal arteries are within normal limits. The aortic bifurcation is unremarkable there are calcifications are present within the internal iliac arteries bilaterally. The liver is unremarkable. The common bile duct and gallbladder are normal. The spleen is mildly enlarged without significant interval change. The pancreas is within normal limits. The adrenal glands and kidneys are normal bilaterally. The ureters and urinary bladder are within normal limits. Urinary bladder is mildly distended. The stomach and duodenum are within normal limits. Small bowel is unremarkable. The appendix is visualized and normal. The ascending and transverse colon are within normal limits. The descending and rectosigmoid colon are unremarkable. The uterus is mildly enlarged, without significant interval change. The adnexa are within normal limits bilaterally. No significant adenopathy is present. The axial skeleton is within normal limits. Pelvis is intact. Degenerative changes are noted at the SI joints bilaterally. Review of the MIP images confirms the above findings. IMPRESSION: 1. No evidence for aortic dissection or vascular injury. 2. Mild atherosclerotic changes within the abdominal aorta and branch vessels without aneurysm. 3. No significant adenopathy or evidence for recurrent lymphoma. 4. Stable mild splenomegaly. 5. 5 mm nodule in the right  lower lobe demonstrates interval increase in size. No follow-up needed if patient is low-risk. Non-contrast chest CT can be considered in 12 months if patient is high-risk. This recommendation follows the consensus statement: Guidelines for Management of Incidental Pulmonary Nodules Detected on CT Images:From the Fleischner Society 2017; published online before print (10.1148/radiol.SG:5268862). 6. Degenerate changes at the SI joints bilaterally. Electronically Signed   By: San Morelle M.D.   On: 04/08/2016 13:39   US Abdomen  Limited Ruq  04/08/2016  CLINICAL DATA:  Chest pain syndrome EXAM: US ABDOMEN LIMITED - RIGHT UPPER QUADRANT COMPARISON:  Abdominal CT 12/13/2010 FINDINGS: Gallbladder: No gallstones or wall thickening visualized. No sonographic Murphy sign noted by sonographer. Common bile duct: Diameter: 3 mm.  Where visualized, no filling defect. Liver: Diffusely echogenic with poor acoustic penetration, limiting the sensitivity of sonography. Antegrade flow in the imaged portal venous system. No evidence of mass. IMPRESSION: 1. Hepatic steatosis. 2. Negative gallbladder. Electronically Signed   By: Monte Fantasia M.D.   On: 04/08/2016 15:59    Scheduled Meds: . aspirin EC  325 mg Oral Daily  . enoxaparin (LOVENOX) injection  40 mg Subcutaneous Q24H  . insulin aspart  0-15 Units Subcutaneous TID WC  . insulin aspart  0-5 Units Subcutaneous QHS  . losartan  100 mg Oral Daily  . metoprolol tartrate  25 mg Oral BID  . multivitamin-prenatal  1 tablet Oral Q1200  . omega-3 acid ethyl esters  1 g Oral BID  . pantoprazole  40 mg Oral Daily  . sodium chloride flush  10 mL Intravenous Q12H    Continuous Infusions:    Time spent: 28mins  Serayah Yazdani MD, PhD  [facility] Pager [phone]. If 7PM-7AM, please contact night-coverage at [email or web address], password [facility] 04/09/2016, 8:19 AM

## 2016-04-09 NOTE — Progress Notes (Signed)
Pt complains of 3/10 chest soreness on mid-sternal, mid upper back, left scapula; not reproduced on palpation. Refused sublingual NTG tabs. Medicated with GI cocktail (which reduced the pain to a 0/10). Patient is stable. Will continue to monitor. EKG done. Dr. Susy Manor text paged.

## 2016-04-10 ENCOUNTER — Encounter: Payer: Self-pay | Admitting: Physician Assistant

## 2016-04-10 ENCOUNTER — Inpatient Hospital Stay (HOSPITAL_COMMUNITY): Payer: BLUE CROSS/BLUE SHIELD

## 2016-04-10 DIAGNOSIS — I2510 Atherosclerotic heart disease of native coronary artery without angina pectoris: Secondary | ICD-10-CM

## 2016-04-10 DIAGNOSIS — R911 Solitary pulmonary nodule: Secondary | ICD-10-CM

## 2016-04-10 DIAGNOSIS — D5701 Hb-SS disease with acute chest syndrome: Secondary | ICD-10-CM

## 2016-04-10 DIAGNOSIS — K760 Fatty (change of) liver, not elsewhere classified: Secondary | ICD-10-CM

## 2016-04-10 DIAGNOSIS — C8307 Small cell B-cell lymphoma, spleen: Secondary | ICD-10-CM

## 2016-04-10 LAB — ECHOCARDIOGRAM COMPLETE
CHL CUP DOP CALC LVOT VTI: 21.8 cm
CHL CUP MV DEC (S): 254
CHL CUP STROKE VOLUME: 52 mL
E decel time: 254 msec
EERAT: 10
FS: 14 % — AB (ref 28–44)
HEIGHTINCHES: 69 in
IV/PV OW: 1.25
LA diam index: 1.79 cm/m2
LASIZE: 36 mm
LAVOL: 42.7 mL
LAVOLA4C: 35.5 mL
LAVOLIN: 21.2 mL/m2
LDCA: 2.84 cm2
LEFT ATRIUM END SYS DIAM: 36 mm
LV E/e'average: 10
LV PW d: 11.4 mm — AB (ref 0.6–1.1)
LV sys vol index: 19 mL/m2
LVDIAVOL: 90 mL (ref 46–106)
LVDIAVOLIN: 45 mL/m2
LVEEMED: 10
LVELAT: 8.49 cm/s
LVOT SV: 62 mL
LVOT diameter: 19 mm
LVOTPV: 95.3 cm/s
LVSYSVOL: 38 mL (ref 14–42)
MV pk E vel: 84.9 m/s
MVPG: 3 mmHg
MVPKAVEL: 112 m/s
Reg peak vel: 235 cm/s
Simpson's disk: 58
TAPSE: 23.3 mm
TDI e' lateral: 8.49
TDI e' medial: 6.85
TR max vel: 235 cm/s
WEIGHTICAEL: 4303.38 [oz_av]

## 2016-04-10 LAB — BASIC METABOLIC PANEL
Anion gap: 8 (ref 5–15)
BUN: 7 mg/dL (ref 6–20)
CHLORIDE: 103 mmol/L (ref 101–111)
CO2: 26 mmol/L (ref 22–32)
CREATININE: 0.54 mg/dL (ref 0.44–1.00)
Calcium: 9.1 mg/dL (ref 8.9–10.3)
GFR calc non Af Amer: >60 mL/min (ref >60)
Glucose, Bld: 224 mg/dL — ABNORMAL HIGH (ref 65–99)
POTASSIUM: 3.7 mmol/L (ref 3.5–5.1)
Sodium: 137 mmol/L (ref 135–145)

## 2016-04-10 LAB — CBC
HEMATOCRIT: 40.1 % (ref 36.0–46.0)
HEMOGLOBIN: 12.6 g/dL (ref 12.0–15.0)
MCH: 23.8 pg — ABNORMAL LOW (ref 26.0–34.0)
MCHC: 31.4 g/dL (ref 30.0–36.0)
MCV: 75.7 fL — AB (ref 78.0–100.0)
Platelets: 166 10*3/uL (ref 150–400)
RBC: 5.3 MIL/uL — AB (ref 3.87–5.11)
RDW: 14.4 % (ref 11.5–15.5)
WBC: 8.4 10*3/uL (ref 4.0–10.5)

## 2016-04-10 LAB — URINE CULTURE

## 2016-04-10 LAB — LIPID PANEL
Cholesterol: 186 mg/dL (ref 0–200)
HDL: 30 mg/dL — ABNORMAL LOW (ref >40)
LDL Cholesterol: 125 mg/dL — ABNORMAL HIGH (ref 0–99)
Total CHOL/HDL Ratio: 6.2 RATIO
Triglycerides: 155 mg/dL — ABNORMAL HIGH (ref <150)
VLDL: 31 mg/dL (ref 0–40)

## 2016-04-10 LAB — TROPONIN I: TROPONIN I: 0.43 ng/mL — AB (ref <0.03)

## 2016-04-10 LAB — GLUCOSE, CAPILLARY
GLUCOSE-CAPILLARY: 237 mg/dL — AB (ref 65–99)
Glucose-Capillary: 266 mg/dL — ABNORMAL HIGH (ref 65–99)

## 2016-04-10 LAB — MAGNESIUM: Magnesium: 1.7 mg/dL (ref 1.7–2.4)

## 2016-04-10 MED ORDER — ASPIRIN 81 MG PO CHEW: 81.0000 mg | CHEWABLE_TABLET | Freq: Every day | ORAL | Status: AC

## 2016-04-10 MED ORDER — RANITIDINE HCL 150 MG PO TABS: 150.0000 mg | ORAL_TABLET | Freq: Two times a day (BID) | ORAL | Status: DC

## 2016-04-10 MED ORDER — PRENATAL MULTIVITAMIN CH
1.0000 | ORAL_TABLET | Freq: Every day | ORAL | Status: DC
  Administered 2016-04-10: 12:00:00 1 via ORAL
  Filled 2016-04-10: qty 1

## 2016-04-10 MED ORDER — ATORVASTATIN CALCIUM 10 MG PO TABS: 10.0000 mg | ORAL_TABLET | Freq: Every day | ORAL | Status: DC

## 2016-04-10 MED ORDER — METOPROLOL TARTRATE 25 MG PO TABS: 25.0000 mg | ORAL_TABLET | Freq: Two times a day (BID) | ORAL | Status: DC

## 2016-04-10 MED ORDER — NITROGLYCERIN 0.4 MG SL SUBL: 0.4000 mg | SUBLINGUAL_TABLET | SUBLINGUAL | Status: AC | PRN

## 2016-04-10 MED ORDER — INSULIN GLARGINE 100 UNIT/ML SOLOSTAR PEN: 10.0000 [IU] | PEN_INJECTOR | Freq: Every day | SUBCUTANEOUS | Status: DC

## 2016-04-10 MED ORDER — TICAGRELOR 90 MG PO TABS: 90.0000 mg | ORAL_TABLET | Freq: Two times a day (BID) | ORAL | Status: DC

## 2016-04-10 MED ORDER — PERFLUTREN LIPID MICROSPHERE
INTRAVENOUS | Status: AC
  Administered 2016-04-10: 11:00:00 2 mL
  Filled 2016-04-10: qty 10

## 2016-04-10 MED ORDER — ACETAMINOPHEN 500 MG PO TABS: 500.0000 mg | ORAL_TABLET | Freq: Three times a day (TID) | ORAL | Status: AC | PRN

## 2016-04-10 MED ORDER — INSULIN ASPART 100 UNIT/ML FLEXPEN: PEN_INJECTOR | SUBCUTANEOUS | Status: DC

## 2016-04-10 NOTE — Care Management Note (Addendum)
Case Management Note  Patient Details  Name: Desiree [name] MRN: [id number] Date of Birth: Aug 18, 1966  Subjective/Objective:    Pt admitted with chest pain                Action/Plan:  PTA independent from home.  Pt discharging home on [place].  CM verified that pt does not have medicaid nor medicare.  Pt was given both free 30 day card and reduced copay card.  CM called pts pharmacy of choice [facility] in [place] is able to fill med .  CM will continue to monitor for discharge needs   Expected Discharge Date:                  Expected Discharge Plan:  Home/Self Care  In-House Referral:     Discharge planning Services  CM Consult, Medication Assistance  Post Acute Care Choice:    Choice offered to:     DME Arranged:    DME Agency:     HH Arranged:    HH Agency:     Status of Service:  In process, will continue to follow  If discussed at Long Length of Stay Meetings, dates discussed:    Additional Comments: CM spoke with bedside nurse prior to pt discharge;  nurse informed during early am that pt does not qualify for [facility] Study due to parameters set forth by study.  No other CM needs determined prior to discharge     Maryclare Labrador, RN 04/10/2016, 9:53 AM

## 2016-04-10 NOTE — Progress Notes (Signed)
Patient Name: Desiree [name] Date of Encounter: 04/10/2016   SUBJECTIVE  Feels chest pain after eating, improved with GI cocktail. Mild shortness of breath.  CURRENT MEDS . aspirin  81 mg Oral Daily  . insulin aspart  0-15 Units Subcutaneous TID WC  . insulin aspart  0-5 Units Subcutaneous QHS  . losartan  100 mg Oral Daily  . metoprolol tartrate  25 mg Oral BID  . multivitamin-prenatal  1 tablet Oral Q1200  . omega-3 acid ethyl esters  1 g Oral BID  . pantoprazole  40 mg Oral Daily  . sodium chloride flush  10 mL Intravenous Q12H  . sodium chloride flush  3 mL Intravenous Q12H  . ticagrelor  90 mg Oral BID    OBJECTIVE  Filed Vitals:   04/09/16 2043 04/10/16 0000 04/10/16 0619 04/10/16 0753  BP: 111/73 109/65 115/77 93/72  Pulse: 94  93 98  Temp: 97.7 F (36.5 C)  98 F (36.7 C) 98 F (36.7 C)  TempSrc: Oral  Oral Oral  Resp: 17 15 13 17   Height:      Weight:   268 lb 15.4 oz (122 kg)   SpO2: 96%  100% 100%    Intake/Output Summary (Last 24 hours) at 04/10/16 0819 Last data filed at 04/09/16 2053  Gross per 24 hour  Intake 2432.4 ml  Output   1200 ml  Net 1232.4 ml   Filed Weights   04/08/16 1600 04/09/16 0300 04/10/16 0619  Weight: 269 lb 14.4 oz (122.426 kg) 270 lb 8 oz (122.698 kg) 268 lb 15.4 oz (122 kg)    PHYSICAL EXAM  General: Pleasant, NAD. Neuro: Alert and oriented X 3. Moves all extremities spontaneously. Psych: Normal affect. HEENT:  Normal  Neck: Supple without bruits or JVD. Lungs:  Resp regular and unlabored, CTA. Heart: RRR no s3, s4, systolic  murmurs. Abdomen: Soft, non-tender, non-distended, BS + x 4.  Extremities: No clubbing, cyanosis or edema. DP/PT/Radials 2+ and equal bilaterally.  r radial cath site without hematoma. Previous R forearm IV site has mild bruise.   Accessory Clinical Findings  CBC  Recent Labs  04/08/16 1214 04/10/16 0552  WBC 9.2 8.4  HGB 13.9 12.6  HCT 43.1 40.1  MCV 72.8* 75.7*  PLT 202 [id number]    Basic Metabolic Panel  Recent Labs  04/08/16 1214 04/10/16 0552  NA 133* 137  K 4.1 3.7  CL 101 103  CO2 21* 26  GLUCOSE 452* 224*  BUN 8 7  CREATININE 0.62 0.54  CALCIUM 9.4 9.1  MG  --  1.7   Liver Function Tests  Recent Labs  04/08/16 1529  AST 20  ALT 26  ALKPHOS 93  BILITOT 0.8  PROT 6.5  ALBUMIN 3.6    Recent Labs  04/08/16 1529  LIPASE 25   Cardiac Enzymes  Recent Labs  04/09/16 0818 04/09/16 1539 04/09/16 1928  TROPONINI 0.82* 0.73* 0.79*   BNP Invalid input(s): POCBNP D-Dimer No results for input(s): DDIMER in the last 72 hours. Hemoglobin A1C  Recent Labs  04/08/16 1529  HGBA1C 12.7*   Fasting Lipid Panel  Recent Labs  04/09/16 0227  CHOL 186  HDL 30*  LDLCALC 125*  TRIG 155*  CHOLHDL 6.2   Thyroid Function Tests  Recent Labs  04/09/16 0818  TSH 2.266    TELE  Sinus rhythm at rate of 90s  Procedures    Coronary Stent Intervention   Left Heart Cath and Coronary Angiography  Conclusion     Mid RCA to Dist RCA lesion, 20% stenosed.  RPDA lesion, 50% stenosed.  The left ventricular systolic function is normal.  Mid LAD to Dist LAD lesion, 85% stenosed. Post intervention, there is a 0% residual stenosis.  1. 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 2. Normal LV function 3. Successful stenting of the LAD with DES  Plan: DAPT for one year. Possible DC in am. Risk factor modification.    Radiology/Studies  Dg Chest 2 View  04/08/2016  CLINICAL DATA:  Chest pain starting yesterday EXAM: CHEST  2 VIEW COMPARISON:  08/18/2014 PET scan FINDINGS: Cardiomediastinal silhouette is stable. Cardiomediastinal silhouette is unremarkable. No acute infiltrate or pleural effusion. No pulmonary edema. Bony thorax is unremarkable. IMPRESSION: No active cardiopulmonary disease. Electronically Signed   By: Lahoma Crocker M.D.   On: 04/08/2016 12:35   Mm Digital Screening  Bilateral  03/28/2016  CLINICAL DATA:  Screening. EXAM: DIGITAL SCREENING BILATERAL MAMMOGRAM WITH CAD COMPARISON:  Previous exam(s). ACR Breast Density Category b: There are scattered areas of fibroglandular density. FINDINGS: There are no findings suspicious for malignancy. Images were processed with CAD. IMPRESSION: No mammographic evidence of malignancy. A result letter of this screening mammogram will be mailed directly to the patient. RECOMMENDATION: Screening mammogram in one year. (Code:SM-B-01Y) BI-RADS CATEGORY  1: Negative. Electronically Signed   By: Lillia Mountain M.D.   On: 03/28/2016 14:08   Ct Angio Chest/abd/pel For Dissection W And/or W/wo  04/08/2016  CLINICAL DATA:  New onset of chest pain beginning last evening with progression since appearing pain radiates into her back. Associated nausea. Pain is increased with exertion. Personal history of lymphoma. EXAM: CT ANGIOGRAPHY CHEST, ABDOMEN AND PELVIS TECHNIQUE: Multidetector CT imaging through the chest, abdomen and pelvis was performed using the standard protocol during bolus administration of intravenous contrast. Multiplanar reconstructed images and MIPs were obtained and reviewed to evaluate the vascular anatomy. CONTRAST:  100 mL Isovue 370 COMPARISON:  PET scan on 08/18/2014 FINDINGS: CTA CHEST FINDINGS New the ascending aorta is within normal limits. There no focal calcifications or intramural hemorrhage. The aortic arch is unremarkable. A 3 vessel arch configuration is present. The descending thoracic aorta is within normal limits. Pulmonary arteries are normal. Heart size is normal. There is no significant pericardial effusion. No significant mediastinal or axillary adenopathy is present. The thoracic inlet demonstrates stable appearance of left thyroid nodule measuring 2.5 x 1.9 x 3.0 cm. A 5 mm nodule at the right lung base has slightly increased in size. No other focal nodule, mass, or airspace disease is present. Minimal dependent  atelectasis is present bilaterally. Bone windows are unremarkable. Vertebral body heights alignment are maintained. The ribs are within normal limits. Review of the MIP images confirms the above findings. CTA ABDOMEN AND PELVIS FINDINGS The abdominal aorta is within normal limits. Minimal calcifications are present. There is no aneurysm or evidence for dissection. The celiac artery and superior mesenteric artery are within normal limits. The inferior mesenteric artery is identified and normal. Both renal arteries are within normal limits. The aortic bifurcation is unremarkable there are calcifications are present within the internal iliac arteries bilaterally. The liver is unremarkable. The common bile duct and gallbladder are normal. The spleen is mildly enlarged without significant interval change. The pancreas is within normal limits. The adrenal glands and kidneys are normal bilaterally. The ureters and urinary bladder are within normal limits. Urinary bladder is mildly distended. The stomach  and duodenum are within normal limits. Small bowel is unremarkable. The appendix is visualized and normal. The ascending and transverse colon are within normal limits. The descending and rectosigmoid colon are unremarkable. The uterus is mildly enlarged, without significant interval change. The adnexa are within normal limits bilaterally. No significant adenopathy is present. The axial skeleton is within normal limits. Pelvis is intact. Degenerative changes are noted at the SI joints bilaterally. Review of the MIP images confirms the above findings. IMPRESSION: 1. No evidence for aortic dissection or vascular injury. 2. Mild atherosclerotic changes within the abdominal aorta and branch vessels without aneurysm. 3. No significant adenopathy or evidence for recurrent lymphoma. 4. Stable mild splenomegaly. 5. 5 mm nodule in the right lower lobe demonstrates interval increase in size. No follow-up needed if patient is low-risk.  Non-contrast chest CT can be considered in 12 months if patient is high-risk. This recommendation follows the consensus statement: Guidelines for Management of Incidental Pulmonary Nodules Detected on CT Images:From the Fleischner Society 2017; published online before print (10.1148/radiol.IJ:2314499). 6. Degenerate changes at the SI joints bilaterally. Electronically Signed   By: San Morelle M.D.   On: 04/08/2016 13:39   US Abdomen Limited Ruq  04/08/2016  CLINICAL DATA:  Chest pain syndrome EXAM: US ABDOMEN LIMITED - RIGHT UPPER QUADRANT COMPARISON:  Abdominal CT 12/13/2010 FINDINGS: Gallbladder: No gallstones or wall thickening visualized. No sonographic Murphy sign noted by sonographer. Common bile duct: Diameter: 3 mm.  Where visualized, no filling defect. Liver: Diffusely echogenic with poor acoustic penetration, limiting the sensitivity of sonography. Antegrade flow in the imaged portal venous system. No evidence of mass. IMPRESSION: 1. Hepatic steatosis. 2. Negative gallbladder. Electronically Signed   By: Monte Fantasia M.D.   On: 04/08/2016 15:59    ASSESSMENT AND PLAN   1. Chest pain  - has atypical and atypical chest pain. Chest pain somewhat improved with Pepto-Bismol and GI cocktail and not with nitroglycerin. Cath showed 80% stenosed mid to dis LAD s/p PTCA & DES. She dose has 50% disease in RPDA.  - Troponin  0.82-->0.73-->0.79. Will get another troponin to see clear trend. Normal LV function on cath.  - Continue ASA, Brillinta, BB. ARB and lovaza. She reported new onset mild shortness of breath. Likely due to [place]. Closely follow.  - She continues to have intermittent chest pain after eating. Never seen by GI. Will defer further management per primary. Continue PPI. She also has chest wall pain which is reproducible with palpation. Trial of tylenol.   2. Palpitation - Denies recent episode of palpitation. TSH normal. Rate improved in BB.   3. Diabetes mellitus type  2, uncontrolled (HCC) - A1c of 12.7. Per primary  4. HLD - 04/09/2016: Cholesterol 186; HDL 30*; LDL Cholesterol 125*; Triglycerides 155*; VLDL 31  - LFT normal. She had leg cramps on lovastatin. Will try low dose lipitor. She is agrees. Will need close follow up.   5. HTN - Stable. Continue ARB and BB.    Dispo: She will need work note until seen in clinic and rx for 30 days supplier of [place]. OK to discharge from cardiac stand point once seen by Dr. Claiborne Billings. Ambulated well.    Signed, Bhagat,Bhavinkumar PA-C Pager [phone]  -------------------------------------------------------------------   - Left ventricle: The cavity size was normal. Wall thickness was  increased in a pattern of mild LVH. There was moderate focal  basal hypertrophy of the septum. Systolic function was normal.  The estimated ejection fraction was in the range of  55% to 60%.  Wall motion was normal; there were no regional wall motion  abnormalities. Doppler parameters are consistent with abnormal  left ventricular relaxation (grade 1 diastolic dysfunction). The  E/e&' ratio is between 8-15, suggesting indeterminate LV filling  pressure. - Left atrium: The atrium was normal in size. - Tricuspid valve: There was mild regurgitation. - Pulmonary arteries: PA peak pressure: 30 mm Hg (S). - Inferior vena cava: The vessel was normal in size. The  respirophasic diameter changes were in the normal range (>= 50%),  consistent with normal central venous pressure.  Impressions:  - Compared to a prior echo report in 2010, the LVEF is stable at  55-60%.  Patient seen and examined. Agree with assessment and plan. Day 1 s/p PCI/DES stent to mid LAD with 50 % PDA stenosis.  Radial cath site stable. No chest pain; feels well. OK for DC today with cardiology f/u.   Troy Sine, MD, [facility] 04/10/2016 2:47 PM

## 2016-04-10 NOTE — Progress Notes (Signed)
CARDIAC REHAB PHASE I   PRE:  Rate/Rhythm: 95 SR  BP:  Sitting: 93/72        SaO2: 98 RA  MODE:  Ambulation: 800 ft   POST:  Rate/Rhythm: 115 ST  BP:  Sitting: 105/74         SaO2: 98 RA  Pt sitting on edge of bed, c/o chest soreness, and a feeling of indigestions that radiates to her back, states this is not new. Pt ambulated 800 ft on RA, handheld assist, steady gait, tolerated well.  Pt c/o mild DOE, denies cp, dizziness, declined rest stop. Completed MI/stent education.  Reviewed risk factors, MI book, anti-platelet therapy, stent card, activity restrictions, ntg, exercise, heart healthy diet, carb counting, portion control, sodium restrictions and phase 2 cardiac rehab. Pt verbalized understanding. Pt agrees to phase 2 cardiac rehab referral, will send to [facility] per pt request. Pt to bed per pt request after walk, call bell within reach.    MD:[id number] Lenna Sciara, RN, BSN 04/10/2016 9:10 AM

## 2016-04-10 NOTE — Discharge Summary (Signed)
Discharge Summary  Catasha Banwart [id number] DOB: 05-Jan-1966  PCP: Rogers Blocker, MD  Admit date: 04/08/2016 Discharge date: 04/10/2016  Time spent: >54mins  Recommendations for Outpatient Follow-up:  1. F/u with PMD within a week  for hospital discharge follow up, repeat cbc/bmp at follow up, pmd to monitor blood sugar control 2. F/u with cardiology  Discharge Diagnoses:  Active Hospital Problems   Diagnosis Date Noted  . Chest pain syndrome 04/08/2016  . Acute chest syndrome ([place]) 04/09/2016  . NSTEMI (non-ST elevated myocardial infarction) ([place])   . Dyslipidemia 04/08/2016  . Pulmonary nodule, right 04/08/2016  . Pain in the chest   . Diabetes mellitus with complication ([place])   . Fatty liver disease, nonalcoholic 0000000  . Tachycardia 07/03/2014  . 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 Hypertension   . 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 Diabetes mellitus type 2, uncontrolled ([place]) 08/20/2007    Resolved Hospital Problems   Diagnosis Date Noted Date Resolved  No resolved problems to display.    Discharge Condition: stable  Diet recommendation: heart healthy/carb modified  Filed Weights   04/08/16 1600 04/09/16 0300 04/10/16 [id number]  Weight: 122.426 kg (269 lb 14.4 oz) 122.698 kg (270 lb 8 oz) 122 kg (268 lb 15.4 oz)    History of present illness:  Chief Complaint: Chest pain  HPI: Maxene Staudacher is a 50 y.o. 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 Patient presents to the ER with reports of left anterior and substernal chest pain radiating to the back. Symptoms began yesterday evening she began having this discomfort over her left breast. She thought it was indigestion. She states it worsened when she was supine. She initially tried vinegar and water solution as well as  walking which somewhat minimized her symptoms but did not relieve them. By this morning she continued to have mild symptoms but treated with Pepto-Bismol as well as ginger ale which further diminished her symptoms. She went to work as usual about 9:30 AM and when she attempted to eat a fried chicken biscuit, her chest pain and indigestion type symptoms recurred. She presented to the ER because her symptoms were severe and continue to radiate into her back. Patient reports while walking to the triage area today she became somewhat fatigued and had increased chest pain. Of note patient has been under significant stress since February of this year after her mother had an MI. Her mother is now in a nursing facility in [place]. The patient continues to work a job in [place] and drive back and forth from her home in [place] and visit and assist with the care of her mother at least 5-7 days per week. She reports because of his schedule she eats out frequently and "grabs food on the run". She does not take NSAIDs.  ED Course:  Temp 97.6-BP 160 01/11/2005-pulse 124 and regular-respirations 20-room air saturations 98% Follow-up vital signs EP 11/05/1990-pulse 102-respirations 16-saturations 98% CT angiogram of the chest/abdomen and pelvis w and w/o contrast: No evidence for aortic dissection or vascular injury, stable mild splenomegaly, 5 mm nodule right lower lobe demonstrated interval increase in size with recommendation to repeat noncontrast chest CT in 12 months Two-view chest x-ray: No active cardiac pulmonary disease Lab data: Sodium 133, potassium 4.1, CO2 21, BUN 8, creatinine 0.62, glucose 452, anion gap 11, point-of-care troponin 0.00, WBCs 9200 differential not obtained,  hemoglobin 13.9, MCV 72.8, platelets 202,000 Medications and treatments: Chewable aspirin 324 mg by mouth 1, nitroglycerin 0.4 mg sublingual 1, Zofran 4 mg IV 1, morphine 4 mg 1, morphine 2 mg IV 1, GI cocktail 30 mL  1  Hospital Course:  Principal Problem:   Chest pain syndrome Active Problems:   Diabetes mellitus type 2, uncontrolled (HCC)   Splenic marginal zone b-cell lymphoma (HCC)   Hypertension   Anemia in neoplastic disease   Tachycardia   Fatty liver disease, nonalcoholic   Dyslipidemia   Pulmonary nodule, right   Pain in the chest   Diabetes mellitus with complication (HCC)   NSTEMI (non-ST elevated myocardial infarction) ([place])   Acute chest syndrome (HCC)    Chest pain with troponin elevation, initial EKG no ST/T. CTA Chest no dissection, no PE.  With positive family history of CAD, uncontrolled dm2 and hld ( ldl 125),  Patient has persistent chest pain, troponin trending up, peaked at 0.82,  she is on asa, betablocker, she is allergic to statin, on lovaza, echo LVEF wnl, no wall motion abnormality, does has grade one diastolic  Cardiology consulted, cardiac cath on 6/28 with DES placed to mid to distal LAD. Patient is to take DAPT for a year with asa/brillinta.  She is cleared to be discharged home by cardiology on 6/29. She is to have close cardiology follow up.   noninsulin dependent dm2,  previously only on metformin a1c 12.8 in 2008, repeat a1c this hospitalization 12.7,  Metformin held since admission, restarted at discharge,  Diabetes education, she is discharged on insulin   HLD; ldl 125, allergic to statin (cramps with lovastatin), on lovaza, cardiology recommended trial of low dose lipitor, patient agreed to it.  HTN;  bp stable on betablocker and acei  H/o splenic marginal zone b cell lymphoma treated witn weekly rituximab and iv dexamethasone in 2015, in remission, iron deficiency anemia and microcytosis Continue outpatient hematology oncology follow up  Pulmonary nodule, right -Noted 5 mm nodule with slight increase in size since previous CT and a nonsmoker -Radiology recommends repeating noncontrast CT in 12 months CTA chest on  04/08/2016: IMPRESSION: 1. No evidence for aortic dissection or vascular injury. 2. Mild atherosclerotic changes within the abdominal aorta and branch vessels without aneurysm. 3. No significant adenopathy or evidence for recurrent lymphoma. 4. Stable mild splenomegaly. 5. 5 mm nodule in the right lower lobe demonstrates interval increase in size. No follow-up needed if patient is low-risk. Non-contrast chest CT can be considered in 12 months if patient is high-risk. This recommendation follows the consensus statement: Guidelines for Management of Incidental Pulmonary Nodules Detected on CT Images:From the Fleischner Society 2017; published online before print (10.1148/radiol.IJ:2314499). 6. Degenerate changes at the SI joints bilaterally.   Obesity: Body mass index is 39.7 kg/(m^2).  Life style modification   Code Status: full  Family Communication: patient   Disposition Plan: discharge home   Consultants:  cardiology  Procedures:  crdiac cath with DES to LAD on 6/28  Antibiotics:  none  Discharge Exam: BP 96/67 mmHg  Pulse 102  Temp(Src) 98 F (36.7 C) (Oral)  Resp 22  Ht 5\' 9"  (1.753 m)  Wt 122 kg (268 lb 15.4 oz)  BMI 39.70 kg/m2  SpO2 100%  LMP 11/09/2015    General: NAD  Cardiovascular: RRR  Respiratory: CTABL  Abdomen: Soft/ND/NT, positive BS  Musculoskeletal: No Edema  Neuro: aaox3   Discharge Instructions You were cared for by a hospitalist during your hospital stay.  If you have any questions about your discharge medications or the care you received while you were in the hospital after you are discharged, you can call the unit and asked to speak with the hospitalist on call if the hospitalist that took care of you is not available. Once you are discharged, your primary care physician will handle any further medical issues. Please note that NO REFILLS for any discharge medications will be authorized once you are discharged, as it is  imperative that you return to your primary care physician (or establish a relationship with a primary care physician if you do not have one) for your aftercare needs so that they can reassess your need for medications and monitor your lab values.      Discharge Instructions    AMB Referral to Cardiac Rehabilitation - Phase II    Complete by:  As directed   Diagnosis:  NSTEMI     Amb Referral to Cardiac Rehabilitation    Complete by:  As directed   Diagnosis:   NSTEMI Coronary Stents       Diet - low sodium heart healthy    Complete by:  As directed   Carb modified     Increase activity slowly    Complete by:  As directed             Medication List    STOP taking these medications        PRENATAL PO      TAKE these medications        acetaminophen 500 MG tablet  Commonly known as:  TYLENOL  Take 1 tablet (500 mg total) by mouth every 8 (eight) hours as needed (pain).     aspirin 81 MG chewable tablet  Chew 1 tablet (81 mg total) by mouth daily.     atorvastatin 10 MG tablet  Commonly known as:  LIPITOR  Take 1 tablet (10 mg total) by mouth daily at 6 PM.     calcium & magnesium carbonates 311-232 MG tablet  Commonly known as:  MYLANTA  Take 1 tablet by mouth once.     fish oil-omega-3 fatty acids 1000 MG capsule  Take 1 g by mouth 2 (two) times daily.     furosemide 40 MG tablet  Commonly known as:  LASIX  Take 40 mg by mouth daily as needed (leg swelling).     insulin aspart 100 UNIT/ML FlexPen  Commonly known as:  NOVOLOG FLEXPEN  Before each meal 3 times a day, 140-199 - 2 units, 200-250 - 4 units, 251-299 - 6 units,  300-349 - 8 units,  350 or above 10 units. Insulin PEN if approved, provide syringes and needles if needed.     Insulin Glargine 100 UNIT/ML Solostar Pen  Commonly known as:  LANTUS  Inject 10 Units into the skin daily at 10 pm.     losartan 100 MG tablet  Commonly known as:  COZAAR  Take 100 mg by mouth daily.     metFORMIN 1000 MG  tablet  Commonly known as:  GLUCOPHAGE  Take 1,000 mg by mouth 2 (two) times daily with a meal.     metoprolol tartrate 25 MG tablet  Commonly known as:  LOPRESSOR  Take 1 tablet (25 mg total) by mouth 2 (two) times daily.     nitroGLYCERIN 0.4 MG SL tablet  Commonly known as:  NITROSTAT  Place 1 tablet (0.4 mg total) under the tongue every 5 (five) minutes as needed for  chest pain.     ranitidine 150 MG tablet  Commonly known as:  ZANTAC  Take 1 tablet (150 mg total) by mouth 2 (two) times daily.     ticagrelor 90 MG Tabs tablet  Commonly known as:  BRILINTA  Take 1 tablet (90 mg total) by mouth 2 (two) times daily.     valACYclovir 500 MG tablet  Commonly known as:  VALTREX  Take 500 mg by mouth 2 (two) times daily as needed. Takes for outbreaks only.       Allergies  Allergen Reactions  . Erythromycin Anaphylaxis    Swelling and difficulty breathing.  . Lovastatin     Leg cramps  . Tolnaftate Hives    Reaction caused by pills and the cream.   Follow-up Information    Follow up with Angelena Form, PA-C On 04/17/2016.   Specialties:  Cardiology, Radiology   Why:  @9am  for TCM   Contact information:   [address] [place] [address] [phone]       Follow up with Rogers Blocker, MD In 1 week.   Specialty:  Internal Medicine   Why:  hospital discharge follow up, repeat cbc/bmp at follow up, please continue work with your primary care [place] for blood sugar control, pmd to arrange repeat Ct chest in 12 months to follow up on right sided lung nodule    Contact information:   [address] [place] [address] [phone]        The results of significant diagnostics from this hospitalization (including imaging, microbiology, ancillary and laboratory) are listed below for reference.    Significant Diagnostic Studies: Dg Chest 2 View  04/08/2016  CLINICAL DATA:  Chest pain starting yesterday EXAM: CHEST  2 VIEW COMPARISON:   08/18/2014 PET scan FINDINGS: Cardiomediastinal silhouette is stable. Cardiomediastinal silhouette is unremarkable. No acute infiltrate or pleural effusion. No pulmonary edema. Bony thorax is unremarkable. IMPRESSION: No active cardiopulmonary disease. Electronically Signed   By: Lahoma Crocker M.D.   On: 04/08/2016 12:35   Mm Digital Screening Bilateral  03/28/2016  CLINICAL DATA:  Screening. EXAM: DIGITAL SCREENING BILATERAL MAMMOGRAM WITH CAD COMPARISON:  Previous exam(s). ACR Breast Density Category b: There are scattered areas of fibroglandular density. FINDINGS: There are no findings suspicious for malignancy. Images were processed with CAD. IMPRESSION: No mammographic evidence of malignancy. A result letter of this screening mammogram will be mailed directly to the patient. RECOMMENDATION: Screening mammogram in one year. (Code:SM-B-01Y) BI-RADS CATEGORY  1: Negative. Electronically Signed   By: Lillia Mountain M.D.   On: 03/28/2016 14:08   Ct Angio Chest/abd/pel For Dissection W And/or W/wo  04/08/2016  CLINICAL DATA:  New onset of chest pain beginning last evening with progression since appearing pain radiates into her back. Associated nausea. Pain is increased with exertion. Personal history of lymphoma. EXAM: CT ANGIOGRAPHY CHEST, ABDOMEN AND PELVIS TECHNIQUE: Multidetector CT imaging through the chest, abdomen and pelvis was performed using the standard protocol during bolus administration of intravenous contrast. Multiplanar reconstructed images and MIPs were obtained and reviewed to evaluate the vascular anatomy. CONTRAST:  100 mL Isovue 370 COMPARISON:  PET scan on 08/18/2014 FINDINGS: CTA CHEST FINDINGS New the ascending aorta is within normal limits. There no focal calcifications or intramural hemorrhage. The aortic arch is unremarkable. A 3 vessel arch configuration is present. The descending thoracic aorta is within normal limits. Pulmonary arteries are normal. Heart size is normal. There is no  significant pericardial effusion.  No significant mediastinal or axillary adenopathy is present. The thoracic inlet demonstrates stable appearance of left thyroid nodule measuring 2.5 x 1.9 x 3.0 cm. A 5 mm nodule at the right lung base has slightly increased in size. No other focal nodule, mass, or airspace disease is present. Minimal dependent atelectasis is present bilaterally. Bone windows are unremarkable. Vertebral body heights alignment are maintained. The ribs are within normal limits. Review of the MIP images confirms the above findings. CTA ABDOMEN AND PELVIS FINDINGS The abdominal aorta is within normal limits. Minimal calcifications are present. There is no aneurysm or evidence for dissection. The celiac artery and superior mesenteric artery are within normal limits. The inferior mesenteric artery is identified and normal. Both renal arteries are within normal limits. The aortic bifurcation is unremarkable there are calcifications are present within the internal iliac arteries bilaterally. The liver is unremarkable. The common bile duct and gallbladder are normal. The spleen is mildly enlarged without significant interval change. The pancreas is within normal limits. The adrenal glands and kidneys are normal bilaterally. The ureters and urinary bladder are within normal limits. Urinary bladder is mildly distended. The stomach and duodenum are within normal limits. Small bowel is unremarkable. The appendix is visualized and normal. The ascending and transverse colon are within normal limits. The descending and rectosigmoid colon are unremarkable. The uterus is mildly enlarged, without significant interval change. The adnexa are within normal limits bilaterally. No significant adenopathy is present. The axial skeleton is within normal limits. Pelvis is intact. Degenerative changes are noted at the SI joints bilaterally. Review of the MIP images confirms the above findings. IMPRESSION: 1. No evidence for  aortic dissection or vascular injury. 2. Mild atherosclerotic changes within the abdominal aorta and branch vessels without aneurysm. 3. No significant adenopathy or evidence for recurrent lymphoma. 4. Stable mild splenomegaly. 5. 5 mm nodule in the right lower lobe demonstrates interval increase in size. No follow-up needed if patient is low-risk. Non-contrast chest CT can be considered in 12 months if patient is high-risk. This recommendation follows the consensus statement: Guidelines for Management of Incidental Pulmonary Nodules Detected on CT Images:From the Fleischner Society 2017; published online before print (10.1148/radiol.IJ:2314499). 6. Degenerate changes at the SI joints bilaterally. Electronically Signed   By: San Morelle M.D.   On: 04/08/2016 13:39   US Abdomen Limited Ruq  04/08/2016  CLINICAL DATA:  Chest pain syndrome EXAM: US ABDOMEN LIMITED - RIGHT UPPER QUADRANT COMPARISON:  Abdominal CT 12/13/2010 FINDINGS: Gallbladder: No gallstones or wall thickening visualized. No sonographic Murphy sign noted by sonographer. Common bile duct: Diameter: 3 mm.  Where visualized, no filling defect. Liver: Diffusely echogenic with poor acoustic penetration, limiting the sensitivity of sonography. Antegrade flow in the imaged portal venous system. No evidence of mass. IMPRESSION: 1. Hepatic steatosis. 2. Negative gallbladder. Electronically Signed   By: Monte Fantasia M.D.   On: 04/08/2016 15:59    Microbiology: Recent Results (from the past 240 hour(s))  Urine culture     Status: Abnormal   Collection Time: 04/09/16  4:16 AM  Result Value Ref Range Status   Specimen Description URINE, CLEAN CATCH  Final   Special Requests NONE  Final   Culture MULTIPLE SPECIES PRESENT, SUGGEST RECOLLECTION (A)  Final   Report Status 04/10/2016 FINAL  Final     Labs: Basic Metabolic Panel:  Recent Labs Lab 04/08/16 1214 04/10/16 0552  NA 133* 137  K 4.1 3.7  CL 101 103  CO2 21* 26  GLUCOSE  452* 224*  BUN 8 7  CREATININE 0.62 0.54  CALCIUM 9.4 9.1  MG  --  1.7   Liver Function Tests:  Recent Labs Lab 04/08/16 1529  AST 20  ALT 26  ALKPHOS 93  BILITOT 0.8  PROT 6.5  ALBUMIN 3.6    Recent Labs Lab 04/08/16 1529  LIPASE 25   No results for input(s): AMMONIA in the last 168 hours. CBC:  Recent Labs Lab 04/08/16 1214 04/10/16 0552  WBC 9.2 8.4  HGB 13.9 12.6  HCT 43.1 40.1  MCV 72.8* 75.7*  PLT 202 166   Cardiac Enzymes:  Recent Labs Lab 04/09/16 0227 04/09/16 0818 04/09/16 1539 04/09/16 1928 04/10/16 0903  TROPONINI 0.45* 0.82* 0.73* 0.79* 0.43*   BNP: BNP (last 3 results) No results for input(s): BNP in the last 8760 hours.  ProBNP (last 3 results) No results for input(s): PROBNP in the last 8760 hours.  CBG:  Recent Labs Lab 04/09/16 1418 04/09/16 1748 04/09/16 2105 04/10/16 0622 04/10/16 1135  GLUCAP 205* 276* 272* 266* 237*       Signed:  Jontay Maston MD, PhD  [facility] 04/10/2016, 9:42 PM

## 2016-04-10 NOTE — Progress Notes (Signed)
  Echocardiogram 2D Echocardiogram with Definity has been performed.  Darlina Sicilian M 04/10/2016, 11:43 AM

## 2016-04-10 NOTE — Progress Notes (Signed)
Patient educated on new medications and on Brilinta's Twilight study but states she did not want to participate at this time. Spoke to [facility] nurse earlier in shift, Tiffany, and she states patient did not qualify for study, patient still refuses at this time.

## 2016-04-11 ENCOUNTER — Telehealth: Payer: Self-pay | Admitting: Cardiology

## 2016-04-11 NOTE — Telephone Encounter (Signed)
New Message   Pt c/o medication issue:  1. Name of Medication: metoprolol/ losartan   2. How are you currently taking this medication (dosage and times per day)? 25mg  /100mg    3. Are you having a reaction (difficulty breathing--STAT)? no  4. What is your medication issue? Pt states she is taking both bp meds and wants to discussion if she should be taking both. Please call back to discuss

## 2016-04-11 NOTE — Telephone Encounter (Signed)
Spoke with patient. Informed patient both metoprolol and losartan  Are both needed after cardiac issue( NSTEMI and PCI) post hospitalization PATIENT VERBALIZED UNDERSTANDING. PATIENT STATES SHE WAS UNABLE TO PURCHASE NOVOLG INJECTIBLE- THE COST WAS OVER $500.  RN INFORMED PATIENT TO CONTACT PRIMARY OR ENDOCRINOLOGIST CONCERNING INSULIN. PREFER THE ABOVE DOCTORS MANAGE DIABETES. PATIENT VOICED UNDERSTANDING -AND WILL CALL  PRIMARY

## 2016-04-16 NOTE — Progress Notes (Addendum)
Cardiology Office Note    Date:  04/17/2016   ID:  Desiree [name], DOB 10/04/1966, MRN [id number]  PCP:  Rogers Blocker, MD  Cardiologist:  Dr. Claiborne Billings  CC: Post hospital follow up  History of Present Illness:  Desiree [name] is a 50 y.o. 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   She was recently admitted from 6/27-6/29/17 for chest pain. She ruled in for NSTEMI (Troponin 0.82-->0.73-->0.79) and underwent heart catheterization on 04/09/16 which showed single vessel obstructive CAD with an 85% focal stenosis of the mid to distal LAD S/P successful stenting DES. She was placed on aspirin and Brilinta. 2-D echo showed normal LV function, G1 DD, mild TR. She did continue to have intermittent chest pain after eating and she was continued on PPI. He had a history of leg cramping with lovastatin and was trialed on low-dose Lipitor.  Today she presents to clinic for follow-up. She has been walking at least 10 minutes every day on the treadmill. She has been watching what she eats and has lost weight already (~10lbs). No LE edema, orthopnea or PND. No dizziness or syncope. She does have some SOB with the Brilinta but she thinks this is getting better. No more palpitations. No blood in her stool or urine. She has been feeling fatigued since discharge.  Past Medical History  Diagnosis Date  . Leukocytosis 07/27/2011  . Hypertension   . Iron deficiency anemia due to chronic blood loss   . Fatigue 11/17/2013  . 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 Diabetes mellitus without complication ([place])   . Splenic marginal zone b-cell lymphoma ([place]) 01/2009    On observation  . Non Hodgkin's lymphoma ([place])     recieving chemo 06/2014  . Palpitation 06/30/2014  . Tachycardia with 100 - 120  beats per minute 11/29/2014    Past Surgical History  Procedure Laterality Date  . Wisdom tooth extraction    . Cardiac catheterization N/A 04/09/2016    Procedure: Left Heart Cath and Coronary Angiography;  Surgeon: Peter M Martinique, MD;  Location: [facility];  Service: Cardiovascular;  Laterality: N/A;  . Cardiac catheterization N/A 04/09/2016    Procedure: Coronary Stent Intervention;  Surgeon: Peter M Martinique, MD;  Location: [facility];  Service: Cardiovascular;  Laterality: N/A;    Current Medications: Outpatient Prescriptions Prior to Visit  Medication Sig Dispense Refill  . acetaminophen (TYLENOL) 500 MG tablet Take 1 tablet (500 mg total) by mouth every 8 (eight) hours as needed (pain). 30 tablet 0  . aspirin 81 MG chewable tablet Chew 1 tablet (81 mg total) by mouth daily. 30 tablet 0  . atorvastatin (LIPITOR) 10 MG tablet Take 1 tablet (10 mg total) by mouth daily at 6 PM. 30 tablet 0  . calcium & magnesium carbonates (MYLANTA) [id number] MG tablet Take 1 tablet by mouth as directed.     . fish oil-omega-3 fatty acids 1000 MG capsule Take 1 g by mouth 2 (two) times daily.     . furosemide (LASIX) 40 MG tablet Take 40 mg by mouth daily as needed (leg swelling).    . insulin aspart (NOVOLOG FLEXPEN) 100 UNIT/ML FlexPen Before each meal 3 times a day, 140-199 - 2 units, 200-250 - 4 units, 251-299 - 6 units,  300-349 -  8 units,  350 or above 10 units. Insulin PEN if approved, provide syringes and needles if needed. 15 mL 1  . Insulin Glargine (LANTUS) 100 UNIT/ML Solostar Pen Inject 10 Units into the skin daily at 10 pm. 15 mL 1  . losartan (COZAAR) 100 MG tablet Take 100 mg by mouth daily.    . metFORMIN (GLUCOPHAGE) 1000 MG tablet Take 1,000 mg by mouth 2 (two) times daily with a meal.      . nitroGLYCERIN (NITROSTAT) 0.4 MG SL tablet Place 1 tablet (0.4 mg total) under the tongue every 5 (five) minutes as needed for chest pain. 30 tablet 0  . ranitidine (ZANTAC) 150 MG  tablet Take 1 tablet (150 mg total) by mouth 2 (two) times daily. 60 tablet 0  . ticagrelor (BRILINTA) 90 MG TABS tablet Take 1 tablet (90 mg total) by mouth 2 (two) times daily. 60 tablet 0  . valACYclovir (VALTREX) 500 MG tablet Take 500 mg by mouth 2 (two) times daily as needed. Takes for outbreaks only.    . metoprolol tartrate (LOPRESSOR) 25 MG tablet Take 1 tablet (25 mg total) by mouth 2 (two) times daily. 60 tablet 0   No facility-administered medications prior to visit.     Allergies:   Erythromycin; Lovastatin; and Tolnaftate   Social History   Social History  . Marital Status: Married    Spouse Name: N/A  . Number of Children: N/A  . Years of Education: N/A   Social History Main Topics  . Smoking status: Former Smoker    Quit date: 10/13/2005  . Smokeless tobacco: Never Used  . Alcohol Use: No  . Drug Use: No  . Sexual Activity: Yes    Birth Control/ Protection: None   Other Topics Concern  . None   Social History Narrative     Family History:  The patient's family history includes Cancer in her maternal aunt and maternal uncle.     ROS:   Please see the history of present illness.    ROS All other systems reviewed and are negative.   PHYSICAL EXAM:   VS:  BP 100/64 mmHg  Pulse 90  Ht 5\' 9"  (1.753 m)  Wt 263 lb 1.9 oz (119.35 kg)  BMI 38.84 kg/m2  LMP 11/09/2015   GEN: Well nourished, well developed, in no acute distress HEENT: normal Neck: no JVD, carotid bruits, or masses Cardiac: RRR; no murmurs, rubs, or gallops,no edema  Respiratory:  clear to auscultation bilaterally, normal work of breathing GI: soft, nontender, nondistended, + BS MS: no deformity or atrophy Skin: warm and dry, no rash Neuro:  Alert and Oriented x 3, Strength and sensation are intact Psych: euthymic mood, full affect  Wt Readings from Last 3 Encounters:  04/17/16 263 lb 1.9 oz (119.35 kg)  04/10/16 268 lb 15.4 oz (122 kg)  12/12/15 275 lb 3.2 oz (124.83 kg)       Studies/Labs Reviewed:   EKG:  EKG is ordered today.  The ekg ordered today demonstrates NSR HR 90  Recent Labs: 04/08/2016: ALT 26 04/09/2016: TSH 2.266 04/10/2016: BUN 7; Creatinine, Ser 0.54; Hemoglobin 12.6; Magnesium 1.7; Platelets 166; Potassium 3.7; Sodium 137   Lipid Panel    Component Value Date/Time   CHOL 186 04/09/2016 0227   TRIG 155* 04/09/2016 0227   HDL 30* 04/09/2016 0227   CHOLHDL 6.2 04/09/2016 0227   VLDL 31 04/09/2016 0227   LDLCALC 125* 04/09/2016 0227    Additional studies/ records that were  reviewed today include:  04/09/16 Procedures    Coronary Stent Intervention   Left Heart Cath and Coronary Angiography    Conclusion     Mid RCA to Dist RCA lesion, 20% stenosed.  RPDA lesion, 50% stenosed.  The left ventricular systolic function is normal.  Mid LAD to Dist LAD lesion, 85% stenosed. Post intervention, there is a 0% residual stenosis.  1. 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 2. Normal LV function 3. Successful stenting of the LAD with DES  Plan: DAPT for one year. Possible DC in am. Risk factor modification.       2D ECHO: 04/10/16 - Left ventricle: The cavity size was normal. Wall thickness was  increased in a pattern of mild LVH. There was moderate focal basal hypertrophy of the septum. Systolic function was normal, The estimated ejection fraction was in the range of 55% to 60%. Wall motion was normal; there were no regional wall motion abnormalities. Doppler parameters are consistent with abnormal left ventricular relaxation (grade 1 diastolic dysfunction). The E/e&' ratio is between 8-15, suggesting indeterminate LV filling pressure. - Left atrium: The atrium was normal in size. - Tricuspid valve: There was mild regurgitation. - Pulmonary arteries: PA peak pressure: 30 mm Hg (S). - Inferior vena cava: The vessel was normal in size. The  respirophasic diameter changes were in  the normal range (>= 50%),  consistent with normal central venous pressure. Impressions: - Compared to a prior echo report in 2010, the LVEF is stable at  55-60%.   ASSESSMENT & PLAN:   CAD: recent NSTEMI and Cath showed 80% stenosed mid to dis LAD s/p PTCA & DES. She dose has 50% disease in RPDA.  - Normal LV function on cath and 2D ECHO  - Continue ASA, Brillinta, BB, statin, ARB and lovaza.   Diabetes mellitus type 2: uncontrolled (HCC) - A1c of 12.7. Encouraged better glycemic control  HLD: 04/09/2016: Cholesterol 186; HDL 30*; LDL Cholesterol 125*; Triglycerides 155*; VLDL 31  - LFT normal. She had leg cramps on lovastatin. Tolerating low dose lipitor.    HTN: BP a little soft today and she has been feeling very fatigued. Will decrease Lopressor from 25 mg twice a day to 12.5 mg twice a day and see if this helps her energy level.      Medication Adjustments/Labs and Tests Ordered: Current medicines are reviewed at length with the patient today.  Concerns regarding medicines are outlined above.  Medication changes, Labs and Tests ordered today are listed in the Patient Instructions below. Patient Instructions  Medication Instructions:  Your physician has recommended you make the following change in your medication:  1.  DECREASE the Lopressor 25 mg taking 1/2 tablet twice a day  Labwork: None ordered  Testing/Procedures: None ordered  Follow-Up: Your physician recommends that you schedule a follow-up appointment in: 3 MONTHS WITH DR. Claiborne Billings   Any Other Special Instructions Will Be Listed Below (If Applicable).     If you need a refill on your cardiac medications before your next appointment, please call your pharmacy.       Signed, Angelena Form, PA-C  04/17/2016 9:29 AM    [facility] [address], [place], [place]  [address] Phone: [phone]; Fax: [phone]

## 2016-04-17 ENCOUNTER — Ambulatory Visit (INDEPENDENT_AMBULATORY_CARE_PROVIDER_SITE_OTHER): Payer: BLUE CROSS/BLUE SHIELD | Admitting: Physician Assistant

## 2016-04-17 ENCOUNTER — Encounter: Payer: Self-pay | Admitting: Physician Assistant

## 2016-04-17 ENCOUNTER — Encounter: Payer: Self-pay | Admitting: *Deleted

## 2016-04-17 VITALS — BP 100/64 | HR 90 | Ht 69.0 in | Wt 263.1 lb

## 2016-04-17 DIAGNOSIS — I214 Non-ST elevation (NSTEMI) myocardial infarction: Secondary | ICD-10-CM

## 2016-04-17 DIAGNOSIS — E785 Hyperlipidemia, unspecified: Secondary | ICD-10-CM

## 2016-04-17 DIAGNOSIS — I10 Essential (primary) hypertension: Secondary | ICD-10-CM

## 2016-04-17 MED ORDER — METOPROLOL TARTRATE 25 MG PO TABS: 12.5000 mg | ORAL_TABLET | Freq: Two times a day (BID) | ORAL | Status: DC

## 2016-04-17 NOTE — Patient Instructions (Signed)
Medication Instructions:  Your physician has recommended you make the following change in your medication:  1.  DECREASE the Lopressor 25 mg taking 1/2 tablet twice a day  Labwork: None ordered  Testing/Procedures: None ordered  Follow-Up: Your physician recommends that you schedule a follow-up appointment in: 3 MONTHS WITH DR. Claiborne Billings   Any Other Special Instructions Will Be Listed Below (If Applicable).     If you need a refill on your cardiac medications before your next appointment, please call your pharmacy.

## 2016-05-02 ENCOUNTER — Telehealth: Payer: Self-pay | Admitting: Cardiology

## 2016-05-02 NOTE — Telephone Encounter (Signed)
Walk in pt form-[employer] papers dropped off-sent interoffice to [place] for completion

## 2016-05-12 ENCOUNTER — Telehealth: Payer: Self-pay | Admitting: Cardiovascular Disease

## 2016-05-12 MED ORDER — TICAGRELOR 90 MG PO TABS: 90.0000 mg | ORAL_TABLET | Freq: Two times a day (BID) | ORAL | 12 refills | Status: DC

## 2016-05-12 MED ORDER — RANITIDINE HCL 150 MG PO TABS
150.0000 mg | ORAL_TABLET | Freq: Two times a day (BID) | ORAL | 12 refills | Status: DC
Start: 2016-05-12 — End: 2016-10-17

## 2016-05-12 MED ORDER — ATORVASTATIN CALCIUM 10 MG PO TABS: 10.0000 mg | ORAL_TABLET | Freq: Every day | ORAL | 12 refills | Status: DC

## 2016-05-12 NOTE — Telephone Encounter (Signed)
Refill sent to the pharmacy electronically.  

## 2016-05-12 NOTE — Telephone Encounter (Signed)
New Message   *STAT* If patient is at the pharmacy, call can be transferred to refill team.   1. Which medications need to be refilled? (please list name of each medication and dose if known) Atorvastin 10mg , Ranitidine 150mg , Brilinta 90mg    2. Which pharmacy/location (including street and city if local pharmacy) is medication to be sent to? [facility], [place]  3. Do they need a 30 day or 90 day supply? 53  Pt states she received meds from hospital visit and wants to know can she get them refilled here. Please call back to discuss

## 2016-05-12 NOTE — Telephone Encounter (Signed)
Pt is a Dr. Claiborne Billings pt. Please advise

## 2016-06-18 ENCOUNTER — Telehealth: Payer: Self-pay | Admitting: Hematology and Oncology

## 2016-06-18 ENCOUNTER — Encounter: Payer: Self-pay | Admitting: Hematology and Oncology

## 2016-06-18 ENCOUNTER — Other Ambulatory Visit (HOSPITAL_BASED_OUTPATIENT_CLINIC_OR_DEPARTMENT_OTHER): Payer: BLUE CROSS/BLUE SHIELD

## 2016-06-18 ENCOUNTER — Ambulatory Visit (HOSPITAL_BASED_OUTPATIENT_CLINIC_OR_DEPARTMENT_OTHER): Payer: BLUE CROSS/BLUE SHIELD | Admitting: Hematology and Oncology

## 2016-06-18 VITALS — BP 124/85 | HR 78 | Temp 97.9°F | Resp 19 | Wt 263.7 lb

## 2016-06-18 DIAGNOSIS — C8307 Small cell B-cell lymphoma, spleen: Secondary | ICD-10-CM

## 2016-06-18 DIAGNOSIS — D500 Iron deficiency anemia secondary to blood loss (chronic): Secondary | ICD-10-CM | POA: Diagnosis not present

## 2016-06-18 DIAGNOSIS — N920 Excessive and frequent menstruation with regular cycle: Secondary | ICD-10-CM

## 2016-06-18 DIAGNOSIS — D72829 Elevated white blood cell count, unspecified: Secondary | ICD-10-CM

## 2016-06-18 LAB — CBC & DIFF AND RETIC
BASO%: 0.4 % (ref 0.0–2.0)
Basophils Absolute: 0.1 10*3/uL (ref 0.0–0.1)
EOS ABS: 0.2 10*3/uL (ref 0.0–0.5)
EOS%: 1.4 % (ref 0.0–7.0)
HCT: 40 % (ref 34.8–46.6)
HGB: 13.3 g/dL (ref 11.6–15.9)
IMMATURE RETIC FRACT: 10.9 % — AB (ref 1.60–10.00)
LYMPH#: 3.6 10*3/uL — AB (ref 0.9–3.3)
LYMPH%: 29 % (ref 14.0–49.7)
MCH: 24.8 pg — ABNORMAL LOW (ref 25.1–34.0)
MCHC: 33.3 g/dL (ref 31.5–36.0)
MCV: 74.5 fL — ABNORMAL LOW (ref 79.5–101.0)
MONO#: 0.7 10*3/uL (ref 0.1–0.9)
MONO%: 5.4 % (ref 0.0–14.0)
NEUT%: 63.8 % (ref 38.4–76.8)
NEUTROS ABS: 8 10*3/uL — AB (ref 1.5–6.5)
NRBC: 0 % (ref 0–0)
Platelets: 194 10*3/uL (ref 145–400)
RBC: 5.37 10*6/uL (ref 3.70–5.45)
RDW: 15 % — AB (ref 11.2–14.5)
RETIC %: 2.01 % (ref 0.70–2.10)
RETIC CT ABS: 107.94 10*3/uL — AB (ref 33.70–90.70)
WBC: 12.5 10*3/uL — AB (ref 3.9–10.3)

## 2016-06-18 LAB — COMPREHENSIVE METABOLIC PANEL
ALT: 16 U/L (ref 0–55)
AST: 11 U/L (ref 5–34)
Albumin: 3.5 g/dL (ref 3.5–5.0)
Alkaline Phosphatase: 120 U/L (ref 40–150)
Anion Gap: 10 mEq/L (ref 3–11)
BILIRUBIN TOTAL: 0.48 mg/dL (ref 0.20–1.20)
BUN: 11.7 mg/dL (ref 7.0–26.0)
CHLORIDE: 103 meq/L (ref 98–109)
CO2: 24 meq/L (ref 22–29)
CREATININE: 0.8 mg/dL (ref 0.6–1.1)
Calcium: 9.1 mg/dL (ref 8.4–10.4)
EGFR: >90 mL/min/{1.73_m2} (ref >90)
GLUCOSE: 275 mg/dL — AB (ref 70–140)
Potassium: 4.4 mEq/L (ref 3.5–5.1)
SODIUM: 138 meq/L (ref 136–145)
TOTAL PROTEIN: 6.9 g/dL (ref 6.4–8.3)

## 2016-06-18 LAB — FERRITIN: Ferritin: 44 ng/ml (ref 9–269)

## 2016-06-18 LAB — LACTATE DEHYDROGENASE: LDH: 234 U/L (ref 125–245)

## 2016-06-18 NOTE — Assessment & Plan Note (Signed)
Overall, she responded well to treatment. She has no evidence of recurrence of hemolytic anemia. I will see her back in 6 months with a repeat history, physical examination and blood work.

## 2016-06-18 NOTE — Assessment & Plan Note (Signed)
She has evidence of microcytosis but not anemic. She has severe menorrhagia, likely causing mild iron deficiency Thalassemia trait is another possibility I recommend she takes prenatal vitamin daily

## 2016-06-18 NOTE — Assessment & Plan Note (Signed)
She has intermittent leukocytosis of unknown etiology. She have a skin rash on her lower extremity from recent insect bite. Certainly, it can cause reactive leukocytosis. Otherwise, she is not symptomatic. I recommend observation only.

## 2016-06-18 NOTE — Progress Notes (Signed)
[facility] OFFICE PROGRESS NOTE  Patient Care Team: Rogers Blocker, MD as PCP - General (Internal Medicine) Heath Lark, MD as Consulting Physician (Hematology and Oncology)  SUMMARY OF ONCOLOGIC HISTORY:   Splenic marginal zone b-cell lymphoma ([place])   01/11/2009 Initial Diagnosis    Splenic marginal zone b-cell lymphoma      06/06/2014 Bone Marrow Biopsy    Bone marrow aspirate and biopsy confirmed extensive involvement by CD20 positive non-Hodgkin's lymphoma.      06/07/2014 Imaging    She had a PET scan which showed predominant splenic involvement.      06/16/2014 - 07/07/2014 Chemotherapy    She started on weekly rituximab.      06/16/2014 Adverse Reaction    She had infusion reaction, resolved with IV dexamethasone.      08/18/2014 Imaging    PET CT scan show resolution of hypermetabolic activity.       INTERVAL HISTORY: Please see below for problem oriented charting. She returns for follow-up. She was recently diagnosed with coronary artery disease requiring placement of stent. Since then, she denies any chest pain or shortness of breath. She has chronic bilateral lower extremity edema She denies excessive bleeding while on antiplatelet agents. She bruises easily. She denies recent menorrhagia No recent infection or lymphadenopathy She thinks she might have an insect bite affecting the legs recently  REVIEW OF SYSTEMS:   Constitutional: Denies fevers, chills or abnormal weight loss Eyes: Denies blurriness of vision Ears, nose, mouth, throat, and face: Denies mucositis or sore throat Respiratory: Denies cough, dyspnea or wheezes Cardiovascular: Denies palpitation, chest discomfort  Gastrointestinal:  Denies nausea, heartburn or change in bowel habits Skin: Denies abnormal skin rashes Lymphatics: Denies new lymphadenopathy  Neurological:Denies numbness, tingling or new weaknesses Behavioral/Psych: Mood is stable, no new changes  All other systems were  reviewed with the patient and are negative.  I have reviewed the past medical history, past surgical history, social history and family history with the patient and they are unchanged from previous note.  ALLERGIES:  is allergic to erythromycin; lovastatin; and tolnaftate.  MEDICATIONS:  Current Outpatient Prescriptions  Medication Sig Dispense Refill  . acetaminophen (TYLENOL) 500 MG tablet Take 1 tablet (500 mg total) by mouth every 8 (eight) hours as needed (pain). 30 tablet 0  . aspirin 81 MG chewable tablet Chew 1 tablet (81 mg total) by mouth daily. 30 tablet 0  . atorvastatin (LIPITOR) 10 MG tablet Take 1 tablet (10 mg total) by mouth daily at 6 PM. 30 tablet 12  . calcium & magnesium carbonates (MYLANTA) 923-300 MG tablet Take 1 tablet by mouth as directed.     . fish oil-omega-3 fatty acids 1000 MG capsule Take 1 g by mouth 2 (two) times daily.     . furosemide (LASIX) 40 MG tablet Take 40 mg by mouth daily as needed (leg swelling).    . insulin aspart (NOVOLOG FLEXPEN) 100 UNIT/ML FlexPen Before each meal 3 times a day, 140-199 - 2 units, 200-250 - 4 units, 251-299 - 6 units,  300-349 - 8 units,  350 or above 10 units. Insulin PEN if approved, provide syringes and needles if needed. 15 mL 1  . Insulin Glargine (LANTUS) 100 UNIT/ML Solostar Pen Inject 10 Units into the skin daily at 10 pm. 15 mL 1  . losartan (COZAAR) 100 MG tablet Take 100 mg by mouth daily.    . metFORMIN (GLUCOPHAGE) 1000 MG tablet Take 1,000 mg by mouth 2 (two)  times daily with a meal.      . metoprolol tartrate (LOPRESSOR) 25 MG tablet Take 0.5 tablets (12.5 mg total) by mouth 2 (two) times daily. 90 tablet 3  . nitroGLYCERIN (NITROSTAT) 0.4 MG SL tablet Place 1 tablet (0.4 mg total) under the tongue every 5 (five) minutes as needed for chest pain. 30 tablet 0  . Potassium Chloride ER 20 MEQ TBCR Take 20 mEq by mouth daily.    . ranitidine (ZANTAC) 150 MG tablet Take 1 tablet (150 mg total) by mouth 2 (two) times  daily. 60 tablet 12  . ticagrelor (BRILINTA) 90 MG TABS tablet Take 1 tablet (90 mg total) by mouth 2 (two) times daily. 60 tablet 12  . valACYclovir (VALTREX) 500 MG tablet Take 500 mg by mouth 2 (two) times daily as needed. Takes for outbreaks only.     No current facility-administered medications for this visit.     PHYSICAL EXAMINATION: ECOG PERFORMANCE STATUS: 1 - Symptomatic but completely ambulatory  Vitals:   06/18/16 0953  BP: 124/85  Pulse: 78  Resp: 19  Temp: 97.9 F (36.6 C)   Filed Weights   06/18/16 0953  Weight: 263 lb 11.2 oz (119.6 kg)    GENERAL:alert, no distress and comfortable. She is morbidly obese SKIN: She has mild skin rash on her lower extremity EYES: normal, Conjunctiva are pink and non-injected, sclera clear OROPHARYNX:no exudate, no erythema and lips, buccal mucosa, and tongue normal  NECK: supple, thyroid normal size, non-tender, without nodularity LYMPH:  no palpable lymphadenopathy in the cervical, axillary or inguinal LUNGS: clear to auscultation and percussion with normal breathing effort HEART: regular rate & rhythm and no murmurs with mild bilateral lower extremity edema ABDOMEN:abdomen soft, non-tender and normal bowel sounds Musculoskeletal:no cyanosis of digits and no clubbing  NEURO: alert & oriented x 3 with fluent speech, no focal motor/sensory deficits  LABORATORY DATA:  I have reviewed the data as listed    Component Value Date/Time   NA 138 06/18/2016 0943   K 4.4 06/18/2016 0943   CL 103 04/10/2016 0552   CL 106 11/19/2012 0837   CO2 24 06/18/2016 0943   GLUCOSE 275 (H) 06/18/2016 0943   GLUCOSE 107 (H) 11/19/2012 0837   BUN 11.7 06/18/2016 0943   CREATININE 0.8 06/18/2016 0943   CALCIUM 9.1 06/18/2016 0943   PROT 6.9 06/18/2016 0943   ALBUMIN 3.5 06/18/2016 0943   AST 11 06/18/2016 0943   ALT 16 06/18/2016 0943   ALKPHOS 120 06/18/2016 0943   BILITOT 0.48 06/18/2016 0943   GFRNONAA >60 04/10/2016 0552   GFRAA >60  04/10/2016 0552    No results found for: SPEP, UPEP  Lab Results  Component Value Date   WBC 12.5 (H) 06/18/2016   NEUTROABS 8.0 (H) 06/18/2016   HGB 13.3 06/18/2016   HCT 40.0 06/18/2016   MCV 74.5 (L) 06/18/2016   PLT 194 06/18/2016      Chemistry      Component Value Date/Time   NA 138 06/18/2016 0943   K 4.4 06/18/2016 0943   CL 103 04/10/2016 0552   CL 106 11/19/2012 0837   CO2 24 06/18/2016 0943   BUN 11.7 06/18/2016 0943   CREATININE 0.8 06/18/2016 0943      Component Value Date/Time   CALCIUM 9.1 06/18/2016 0943   ALKPHOS 120 06/18/2016 0943   AST 11 06/18/2016 0943   ALT 16 06/18/2016 0943   BILITOT 0.48 06/18/2016 0943      ASSESSMENT &  PLAN:  Splenic marginal zone b-cell lymphoma (HCC) Overall, she responded well to treatment. She has no evidence of recurrence of hemolytic anemia. I will see her back in 6 months with a repeat history, physical examination and blood work.  Iron deficiency anemia due to chronic blood loss She has evidence of microcytosis but not anemic. She has severe menorrhagia, likely causing mild iron deficiency Thalassemia trait is another possibility I recommend she takes prenatal vitamin daily   Leukocytosis She has intermittent leukocytosis of unknown etiology. She have a skin rash on her lower extremity from recent insect bite. Certainly, it can cause reactive leukocytosis. Otherwise, she is not symptomatic. I recommend observation only.   Orders Placed This Encounter  Procedures  . CBC & Diff and Retic    Standing Status:   Future    Standing Expiration Date:   07/23/2017  . Comprehensive metabolic panel    Standing Status:   Future    Standing Expiration Date:   07/23/2017   All questions were answered. The patient knows to call the clinic with any problems, questions or concerns. No barriers to learning was detected. I spent 15 minutes counseling the patient face to face. The total time spent in the appointment was  20 minutes and more than 50% was on counseling and review of test results     [facility], [place], MD 06/18/2016 3:37 PM

## 2016-06-18 NOTE — Telephone Encounter (Signed)
AVS REPORT AND SCHD GIVEN PER 06/18/16 LOS. °

## 2016-07-07 IMAGING — CR DG KNEE COMPLETE 4+V*R*
3 series · 3 of 3 positions shown · non-contrast
Comparison: None.

CLINICAL DATA: 49-year-old female with a history of right knee pain

EXAM:
RIGHT KNEE - COMPLETE 4+ VIEW

[w knee obl. right (1 of 2)]
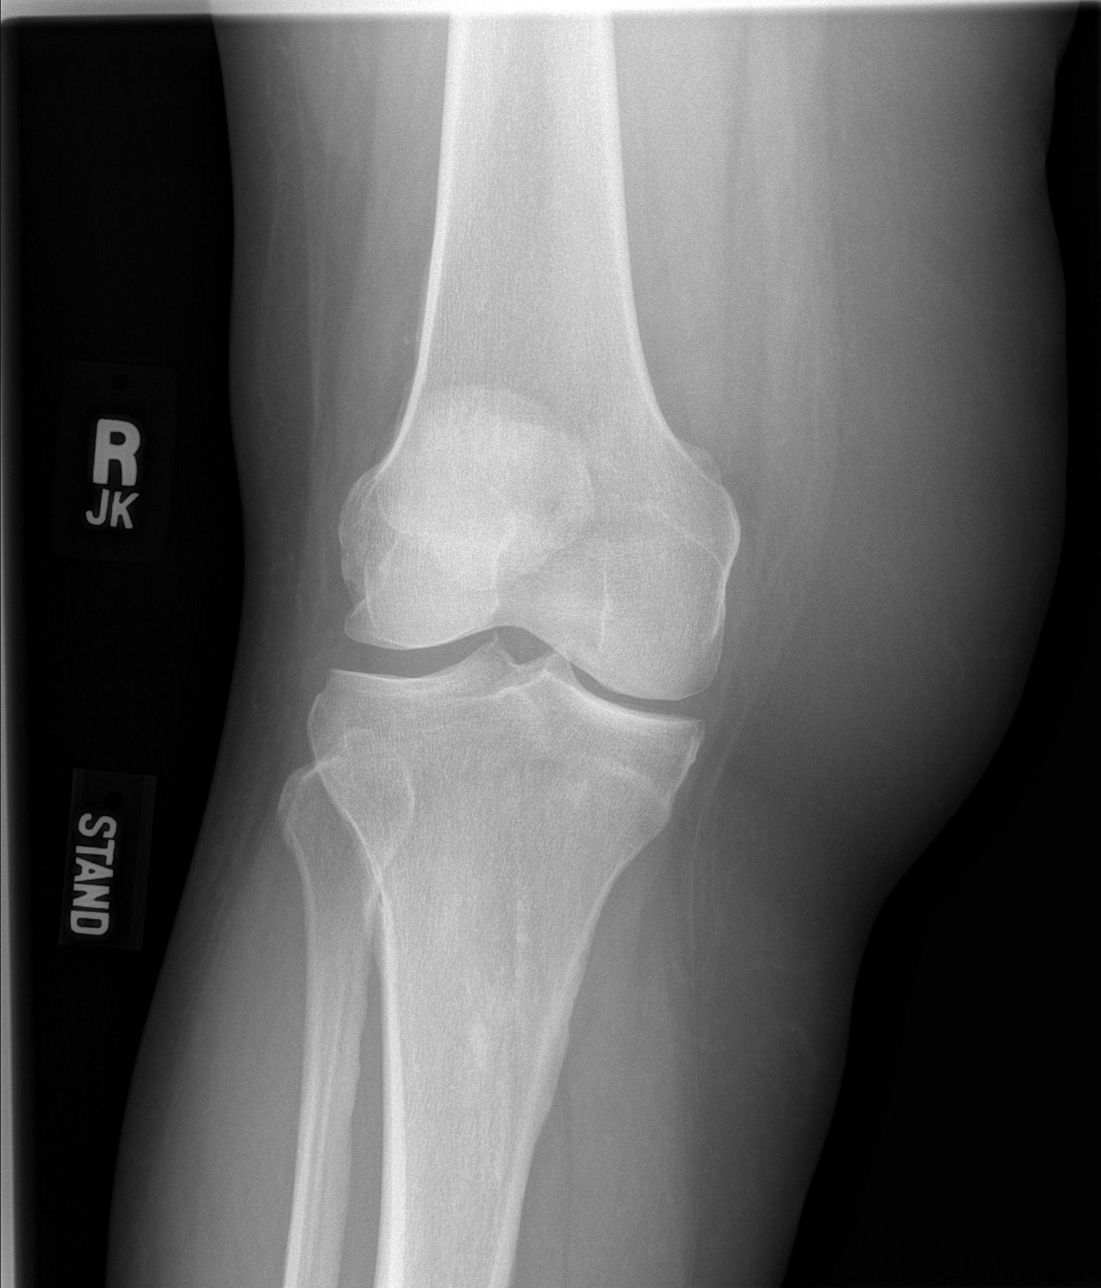

[w knee obl. right (2 of 2)]
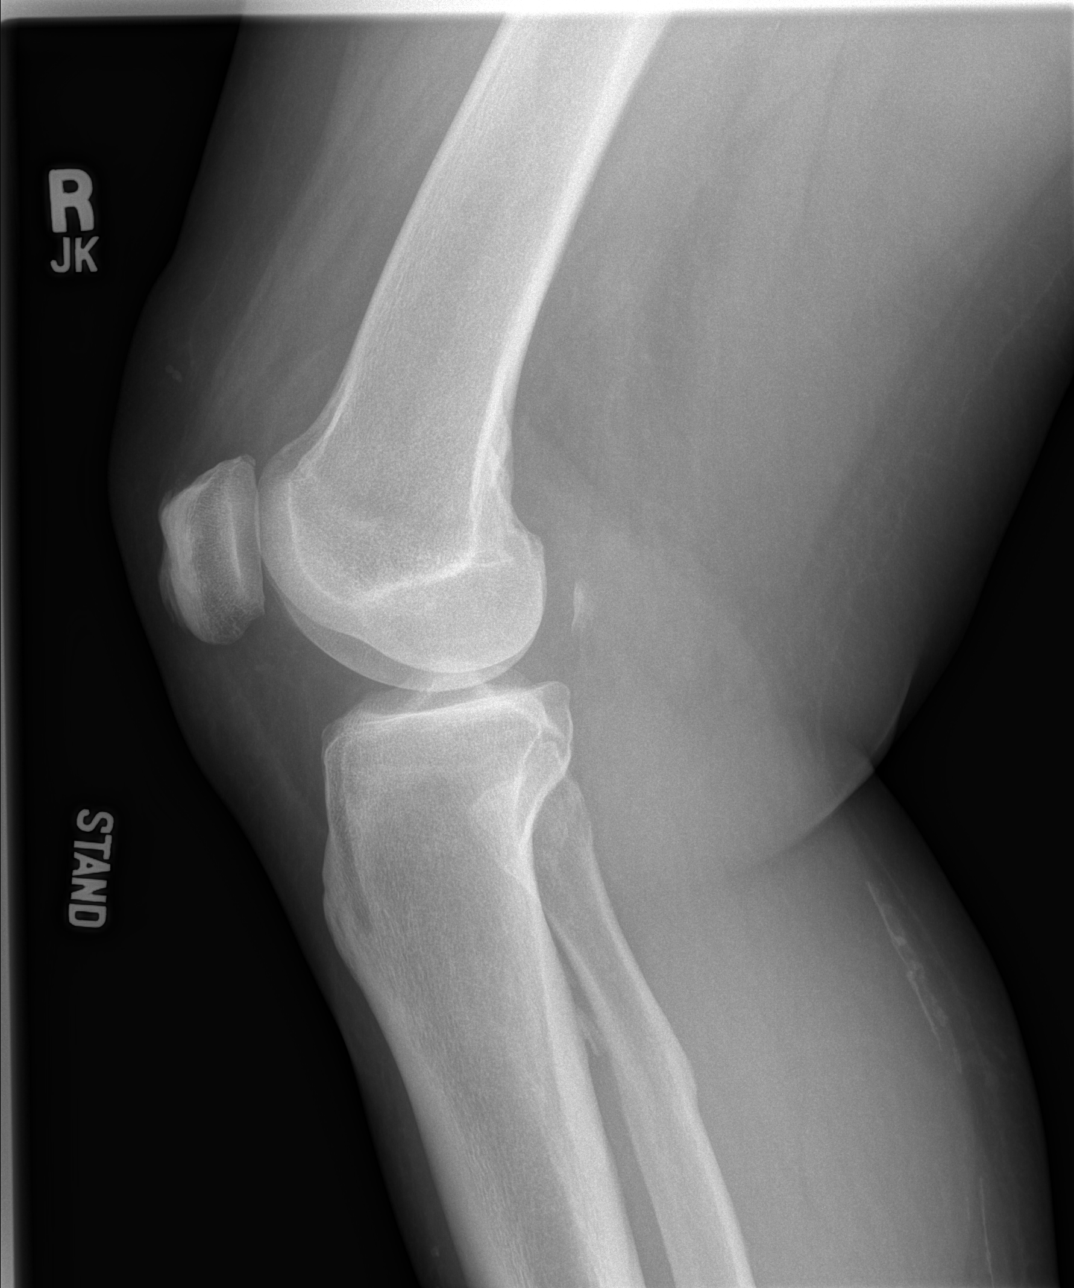

[w knee lat. right]
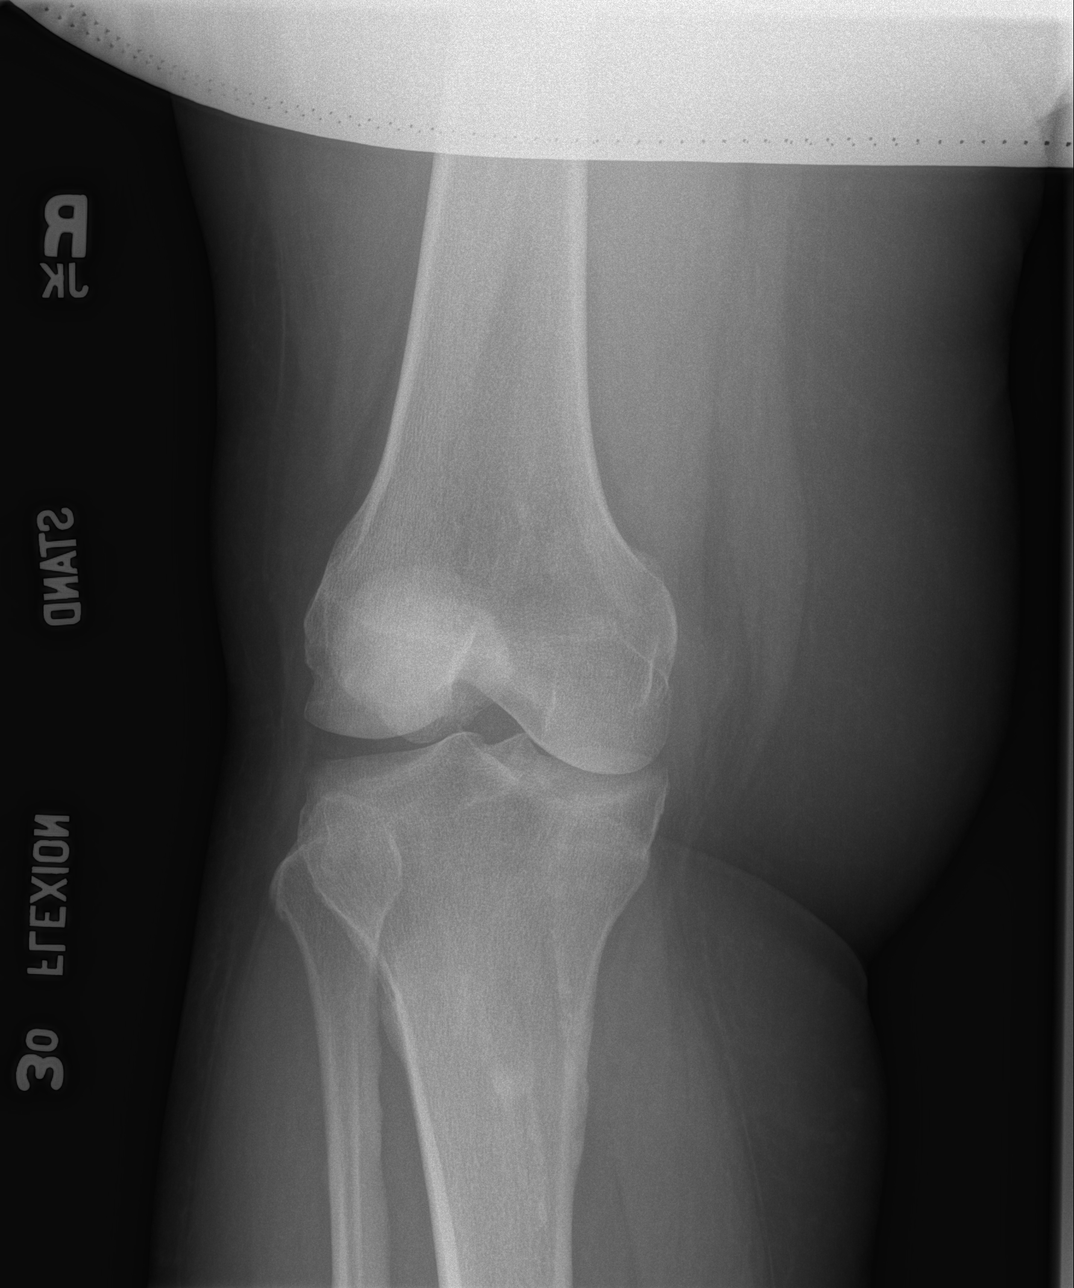

[3 of 3 positions shown; findings below may reference images not displayed]

FINDINGS: No acute fracture identified. No significant soft tissue swelling or
joint effusion.

Medial greater than lateral joint space narrowing with early
marginal osteophyte formation. Patellofemoral degenerative changes,
with mild lateral displacement of the patella on the sunrise view.

Vascular calcifications.
IMPRESSION: Negative for acute bony abnormality.

Early osteoarthritis.

## 2016-07-07 IMAGING — CR DG KNEE COMPLETE 4+V*R*
1 series · 1 of 1 positions shown · non-contrast
Comparison: None.

CLINICAL DATA: 49-year-old female with a history of right knee pain

EXAM:
RIGHT KNEE - COMPLETE 4+ VIEW

[view not recorded]
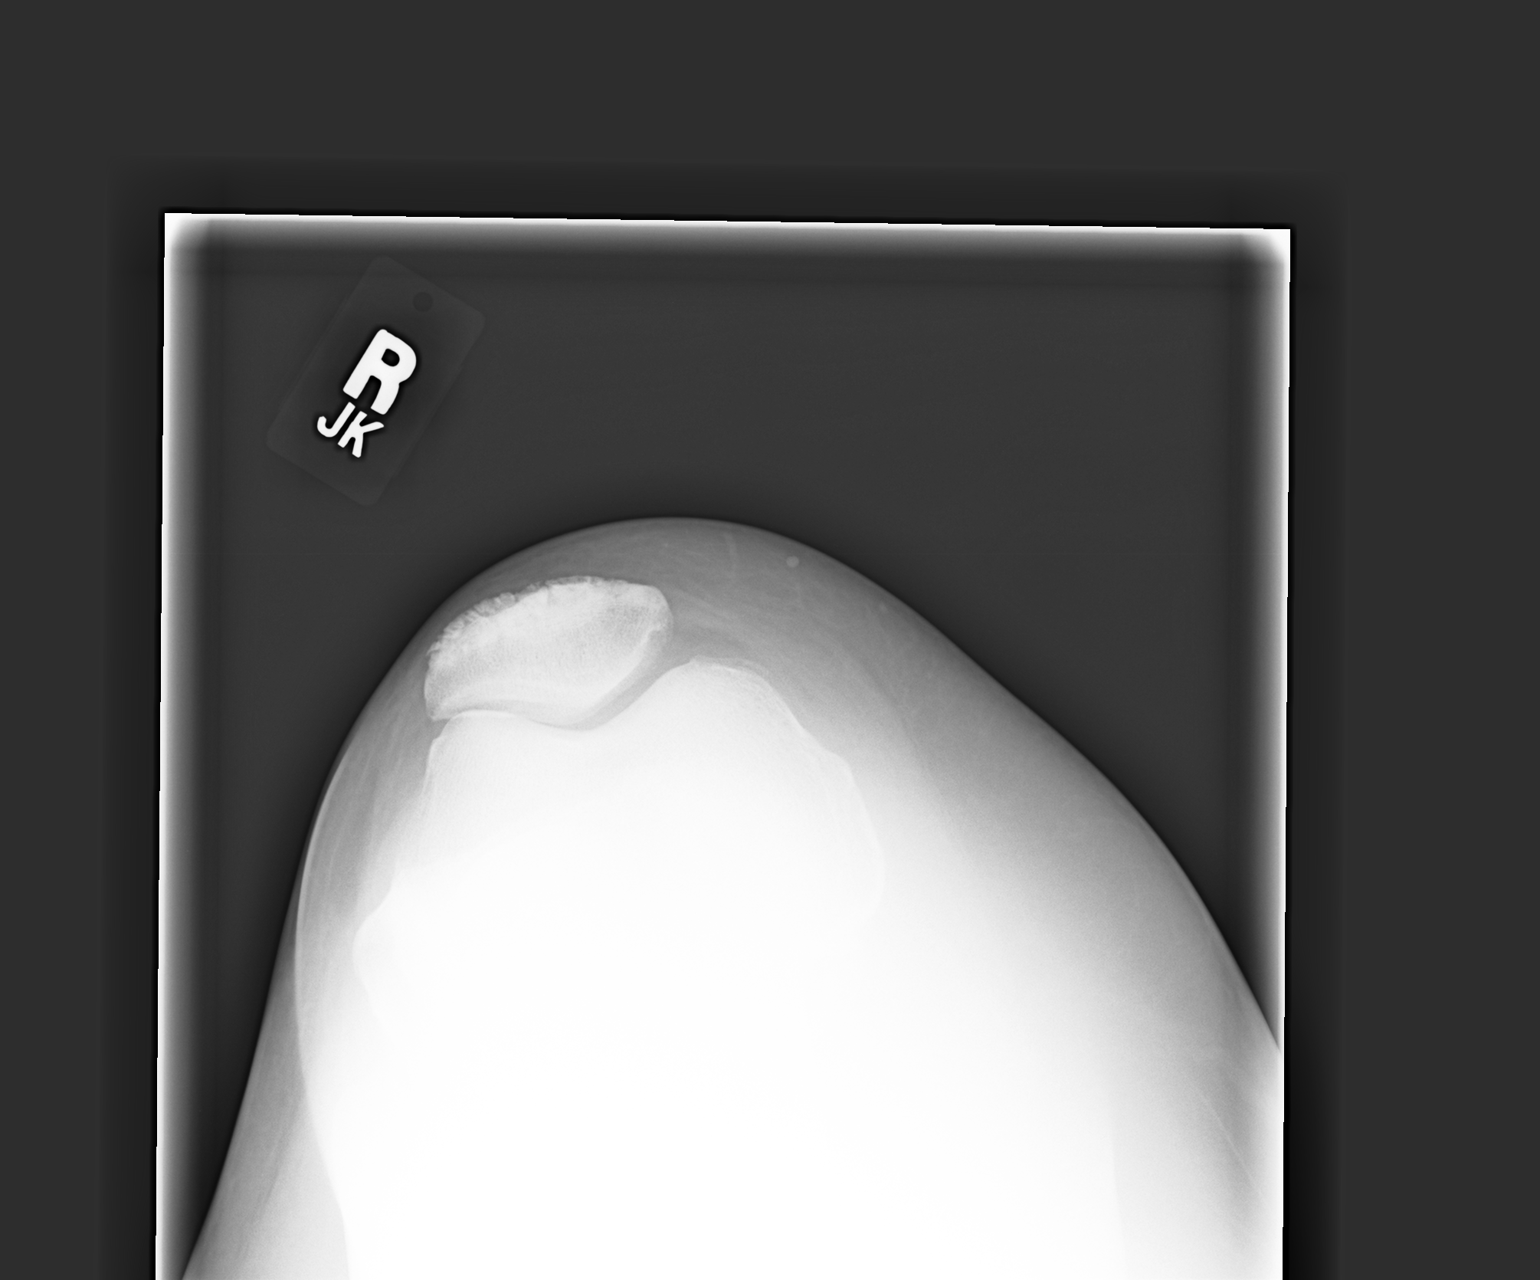

[1 of 1 positions shown; findings below may reference images not displayed]

FINDINGS: No acute fracture identified. No significant soft tissue swelling or
joint effusion.

Medial greater than lateral joint space narrowing with early
marginal osteophyte formation. Patellofemoral degenerative changes,
with mild lateral displacement of the patella on the sunrise view.

Vascular calcifications.
IMPRESSION: Negative for acute bony abnormality.

Early osteoarthritis.

## 2016-07-16 ENCOUNTER — Other Ambulatory Visit: Payer: Self-pay | Admitting: *Deleted

## 2016-07-16 ENCOUNTER — Telehealth: Payer: Self-pay | Admitting: *Deleted

## 2016-07-16 MED ORDER — TICAGRELOR 90 MG PO TABS
90.0000 mg | ORAL_TABLET | Freq: Two times a day (BID) | ORAL | 3 refills | Status: DC
Start: 2016-07-16 — End: 2016-08-14

## 2016-07-16 NOTE — Telephone Encounter (Signed)
Patient called with concern of her brilinta lasting her until her mail order came in. I gave her samples and a coupon to help her get over until it came in.

## 2016-08-05 ENCOUNTER — Ambulatory Visit (INDEPENDENT_AMBULATORY_CARE_PROVIDER_SITE_OTHER): Payer: BLUE CROSS/BLUE SHIELD | Admitting: Cardiovascular Disease

## 2016-08-05 ENCOUNTER — Encounter: Payer: Self-pay | Admitting: Cardiovascular Disease

## 2016-08-05 VITALS — BP 122/82 | HR 95 | Ht 69.0 in | Wt 270.8 lb

## 2016-08-05 DIAGNOSIS — E1165 Type 2 diabetes mellitus with hyperglycemia: Secondary | ICD-10-CM

## 2016-08-05 DIAGNOSIS — IMO0001 Reserved for inherently not codable concepts without codable children: Secondary | ICD-10-CM

## 2016-08-05 DIAGNOSIS — R718 Other abnormality of red blood cells: Secondary | ICD-10-CM | POA: Diagnosis not present

## 2016-08-05 DIAGNOSIS — I214 Non-ST elevation (NSTEMI) myocardial infarction: Secondary | ICD-10-CM

## 2016-08-05 DIAGNOSIS — R5383 Other fatigue: Secondary | ICD-10-CM

## 2016-08-05 DIAGNOSIS — E784 Other hyperlipidemia: Secondary | ICD-10-CM

## 2016-08-05 DIAGNOSIS — D500 Iron deficiency anemia secondary to blood loss (chronic): Secondary | ICD-10-CM

## 2016-08-05 DIAGNOSIS — E7849 Other hyperlipidemia: Secondary | ICD-10-CM

## 2016-08-05 DIAGNOSIS — I10 Essential (primary) hypertension: Secondary | ICD-10-CM

## 2016-08-05 DIAGNOSIS — Z79899 Other long term (current) drug therapy: Secondary | ICD-10-CM

## 2016-08-05 MED ORDER — METOPROLOL TARTRATE 25 MG PO TABS: 25.0000 mg | ORAL_TABLET | Freq: Two times a day (BID) | ORAL | 11 refills | Status: DC

## 2016-08-05 NOTE — Patient Instructions (Signed)
Your physician has recommended you make the following change in your medication:   1.) the lopressor has been increased to 25 mg twice a day. ( 1 tablet)  2.) the furosemide and potassium has been increased to 1 tablet every other day.  Your physician recommends that you return for lab work fasting.  Dr Claiborne Billings recommends that you decrease your salt intake.  Your physician recommends that you schedule a follow-up appointment in: 3 months

## 2016-08-07 DIAGNOSIS — E785 Hyperlipidemia, unspecified: Secondary | ICD-10-CM | POA: Insufficient documentation

## 2016-08-07 NOTE — Progress Notes (Signed)
Cardiology Office Note    Date:  08/07/2016   ID:  Desiree [name], DOB [birth year], MRN [id number]  PCP:  Rogers Blocker, MD  Cardiologist:  Shelva Majestic, MD   Initial follow-up with me following her non-ST segment elevation MI.  History of Present Illness:  Desiree [name] is a 50 y.o. female who has a history of type 2 diabetes mellitus, obesity, splenic marginal zone B-cell lymphoma in remission, anemia, dyslipidemia, and hypertension.  She was admitted to [facility] hospital on June 27 in the setting of a non-ST segment elevation MI.  Cardiac catheterization revealed 85% focal stenosis in the mid LAD for which she underwent successful stenting with a Promus premier 2.2512 mm DES stent.  She had mild concomitant CAD with 20% irregularity in the mid RCA and 50% PDA stenoses.  An echo Doppler study showed an EF of 55-60% with mild LVH and Grade 1 diastolic dysfunction.  She was discharged on dual antiplatelet therapy and was seen by Kathlene November, PAC on 04/17/2016 for initial follow-up evaluation.  She has been on only low-dose statin at 10 mg with a history of leg cramping.  Past several months.  She denies any episodes of chest pain.  She has been walking.  At times he notes leg swelling and has been taking Lasix 40 mg to times per week.  She also is on losartan 100 mg daily, Toprol 12.5 twice a day.  She is on over-the-counter omega-3 fatty acids.  She presents for follow-up evaluation.   Past Medical History:  Diagnosis Date  . 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 Diabetes mellitus without complication ([place])   . Fatigue 11/17/2013  . Hypertension   . Iron deficiency anemia due to chronic blood loss   . Leukocytosis 07/27/2011  . Non Hodgkin's lymphoma ([place])    recieving chemo 06/2014  . Palpitation 06/30/2014  . Splenic marginal zone b-cell lymphoma ([place]) 01/2009   On observation  . Tachycardia with 100 - 120 beats per minute 11/29/2014    Past Surgical History:  Procedure  Laterality Date  . CARDIAC CATHETERIZATION N/A 04/09/2016   Procedure: Left Heart Cath and Coronary Angiography;  Surgeon: Peter M Martinique, MD;  Location: [facility];  Service: Cardiovascular;  Laterality: N/A;  . CARDIAC CATHETERIZATION N/A 04/09/2016   Procedure: Coronary Stent Intervention;  Surgeon: Peter M Martinique, MD;  Location: [facility];  Service: Cardiovascular;  Laterality: N/A;  . WISDOM TOOTH EXTRACTION      Current Medications: Outpatient Medications Prior to Visit  Medication Sig Dispense Refill  . acetaminophen (TYLENOL) 500 MG tablet Take 1 tablet (500 mg total) by mouth every 8 (eight) hours as needed (pain). 30 tablet 0  . aspirin 81 MG chewable tablet Chew 1 tablet (81 mg total) by mouth daily. 30 tablet 0  . atorvastatin (LIPITOR) 10 MG tablet Take 1 tablet (10 mg total) by mouth daily at 6 PM. 30 tablet 12  . calcium & magnesium carbonates (MYLANTA) 193-790 MG tablet Take 1 tablet by mouth as directed.     . fish oil-omega-3 fatty acids 1000 MG capsule Take 1 g by mouth 2 (two) times daily.     . furosemide (LASIX) 40 MG tablet Take 40 mg by mouth every other day.    . insulin aspart (NOVOLOG FLEXPEN) 100 UNIT/ML FlexPen Before each meal 3 times a day, 140-199 - 2 units, 200-250 - 4 units, 251-299 - 6 units,  300-349 - 8 units,  350  or above 10 units. Insulin PEN if approved, provide syringes and needles if needed. 15 mL 1  . losartan (COZAAR) 100 MG tablet Take 100 mg by mouth daily.    . metFORMIN (GLUCOPHAGE) 1000 MG tablet Take 1,000 mg by mouth 2 (two) times daily with a meal.      . nitroGLYCERIN (NITROSTAT) 0.4 MG SL tablet Place 1 tablet (0.4 mg total) under the tongue every 5 (five) minutes as needed for chest pain. 30 tablet 0  . Potassium Chloride ER 20 MEQ TBCR Take 20 mEq by mouth every other day.    . ranitidine (ZANTAC) 150 MG tablet Take 1 tablet (150 mg total) by mouth 2 (two) times daily. 60 tablet 12  . ticagrelor (BRILINTA) 90 MG TABS  tablet Take 1 tablet (90 mg total) by mouth 2 (two) times daily. 180 tablet 3  . valACYclovir (VALTREX) 500 MG tablet Take 500 mg by mouth 2 (two) times daily as needed. Takes for outbreaks only.    . metoprolol tartrate (LOPRESSOR) 25 MG tablet Take 0.5 tablets (12.5 mg total) by mouth 2 (two) times daily. 90 tablet 3  . Insulin Glargine (LANTUS) 100 UNIT/ML Solostar Pen Inject 10 Units into the skin daily at 10 pm. 15 mL 1   No facility-administered medications prior to visit.      Allergies:   Erythromycin; Lovastatin; and Tolnaftate   Social History   Social History  . Marital status: Married    Spouse name: N/A  . Number of children: N/A  . Years of education: N/A   Social History Main Topics  . Smoking status: Former Smoker    Quit date: 10/13/2005  . Smokeless tobacco: Never Used  . Alcohol use No  . Drug use: No  . Sexual activity: Yes    Birth control/ protection: None   Other Topics Concern  . None   Social History Narrative  . None     Family History:  The patient's family history includes Cancer in her maternal aunt and maternal uncle.   ROS General: Positive for obesity;No fevers, chills, or night sweats;  HEENT: Negative; No changes in vision or hearing, sinus congestion, difficulty swallowing Pulmonary: Negative; No cough, wheezing, shortness of breath, hemoptysis Cardiovascular: Negative; No chest pain, presyncope, syncope, palpitations GI: Negative; No nausea, vomiting, diarrhea, or abdominal pain GU: Negative; No dysuria, hematuria, or difficulty voiding Musculoskeletal: Negative; no myalgias, joint pain, or weakness Hematologic/Oncology: Negative; no easy bruising, bleeding Endocrine: Positive for diabetes mellitus Neuro: Negative; no changes in balance, headaches Skin: Negative; No rashes or skin lesions Psychiatric: Negative; No behavioral problems, depression Sleep: Negative; No snoring, daytime sleepiness, hypersomnolence, bruxism, restless legs,  hypnogognic hallucinations, no cataplexy Other comprehensive 14 point system review is negative.   PHYSICAL EXAM:   VS:  BP 122/82   Pulse 95   Ht '5\' 9"'  (1.753 m)   Wt 270 lb 12.8 oz (122.8 kg)   BMI 39.99 kg/m    Wt Readings from Last 3 Encounters:  08/05/16 270 lb 12.8 oz (122.8 kg)  06/18/16 263 lb 11.2 oz (119.6 kg)  04/17/16 263 lb 1.9 oz (119.4 kg)    General: Alert, oriented, no distress.  Skin: normal turgor, no rashes, warm and dry HEENT: Normocephalic, atraumatic. Pupils equal round and reactive to light; sclera anicteric; extraocular muscles intact; Fundi Without hemorrhages or exudates Nose without nasal septal hypertrophy Mouth/Parynx benign; Mallinpatti scale 3 Neck: No JVD, no carotid bruits; normal carotid upstroke Lungs: clear to ausculatation and  percussion; no wheezing or rales Chest wall: without tenderness to palpitation Heart: PMI not displaced, RRR, s1 s2 normal, 1/6 systolic murmur, no diastolic murmur, no rubs, gallops, thrills, or heaves Abdomen: soft, nontender; no hepatosplenomehaly, BS+; abdominal aorta nontender and not dilated by palpation. Back: no CVA tenderness Pulses 2+ Musculoskeletal: full range of motion, normal strength, no joint deformities Extremities: no clubbing cyanosis or edema, Homan's sign negative  Neurologic: grossly nonfocal; Cranial nerves grossly wnl Psychologic: Normal mood and affect   Studies/Labs Reviewed:   ECG (independently read by me): Sinus rhythm at 95 bpm with T-wave inversion in lead 3.  Recent Labs: BMP Latest Ref Rng & Units 06/18/2016 04/10/2016 04/08/2016  Glucose 70 - 140 mg/dl 275(H) 224(H) 452(H)  BUN 7.0 - 26.0 mg/dL 11.'7 7 8  ' Creatinine 0.6 - 1.1 mg/dL 0.8 0.54 0.62  Sodium 136 - 145 mEq/L 138 137 133(L)  Potassium 3.5 - 5.1 mEq/L 4.4 3.7 4.1  Chloride 101 - 111 mmol/L - 103 101  CO2 22 - 29 mEq/L 24 26 21(L)  Calcium 8.4 - 10.4 mg/dL 9.1 9.1 9.4     Hepatic Function Latest Ref Rng & Units 06/18/2016  04/08/2016 12/12/2015  Total Protein 6.4 - 8.3 g/dL 6.9 6.5 6.5  Albumin 3.5 - 5.0 g/dL 3.5 3.6 3.6  AST 5 - 34 U/L '11 20 14  ' ALT 0 - 55 U/L '16 26 22  ' Alk Phosphatase 40 - 150 U/L 120 93 100  Total Bilirubin 0.20 - 1.20 mg/dL 0.48 0.8 0.57  Bilirubin, Direct 0.1 - 0.5 mg/dL - 0.2 -    CBC Latest Ref Rng & Units 06/18/2016 04/10/2016 04/08/2016  WBC 3.9 - 10.3 10e3/uL 12.5(H) 8.4 9.2  Hemoglobin 11.6 - 15.9 g/dL 13.3 12.6 13.9  Hematocrit 34.8 - 46.6 % 40.0 40.1 43.1  Platelets 145 - 400 10e3/uL 194 166 202   Lab Results  Component Value Date   MCV 74.5 (L) 06/18/2016   MCV 75.7 (L) 04/10/2016   MCV 72.8 (L) 04/08/2016   Lab Results  Component Value Date   TSH 2.266 04/09/2016   Lab Results  Component Value Date   HGBA1C 12.7 (H) 04/08/2016     BNP No results found for: BNP  ProBNP No results found for: PROBNP   Lipid Panel     Component Value Date/Time   CHOL 186 04/09/2016 0227   TRIG 155 (H) 04/09/2016 0227   HDL 30 (L) 04/09/2016 0227   CHOLHDL 6.2 04/09/2016 0227   VLDL 31 04/09/2016 0227   LDLCALC 125 (H) 04/09/2016 0227     RADIOLOGY: No results found.   Additional studies/ records that were reviewed today include:  I have reviewed her 11 through June 29 hospitalization and discharge summary; cardiac catheterization data, echo Doppler study, and PA.  Follow-up office visit.   ASSESSMENT:    1. Essential hypertension   2. Uncontrolled type 2 diabetes mellitus without complication, without long-term current use of insulin ([place])   3. Fatigue, unspecified type   4. Microcytosis   5. Iron deficiency anemia due to chronic blood loss   6. Other hyperlipidemia   7. Drug therapy   8. NSTEMI (non-ST elevated myocardial infarction) ([place])   9. Morbid obesity [facility])      PLAN:  Ms. Caree Wolpert is a 50 year old obese African-American female who suffered a non-ST segment elevation myocardial infarction on 04/08/2016 and on 04/09/2016 underwent  successful DES stenting to a high-grade mid LAD stenosis.  She has mild concomitant CAD  in her RCA.  She has normal LV function and did not sustain significant damage or wall motion abnormality.  Her body mass index is right at the morbid obesity threshold.  Weight loss was highly recommended.  I have recommended that she exercise at least 5 days per week for 30 minutes.  She is not well beta blocked on her current dose of metoprolol at 12.5 g twice a day, and I will titrate this to 25 mg twice a day.  With her leg swelling.  I am suggesting she increase her Lasix to every other day with supplemental potassium. I will repeat fasting blood work including a lipid panel, hemoglobin A1c, be met, TSH, as well as iron studies with her microcytosis.  Depending upon her blood work, I suspect we will need to titrate atorvastatin if she is able to tolerate this for more optimal management with high potency statin therapy.  I will see her in 3 months for reevaluation.   Medication Adjustments/Labs and Tests Ordered: Current medicines are reviewed at length with the patient today.  Concerns regarding medicines are outlined above.  Medication changes, Labs and Tests ordered today are listed in the Patient Instructions below. Patient Instructions  Your physician has recommended you make the following change in your medication:   1.) the lopressor has been increased to 25 mg twice a day. ( 1 tablet)  2.) the furosemide and potassium has been increased to 1 tablet every other day.  Your physician recommends that you return for lab work fasting.  Dr Claiborne Billings recommends that you decrease your salt intake.  Your physician recommends that you schedule a follow-up appointment in: 3 months       Signed, Shelva Majestic, MD  08/07/2016 3:43 PM    [facility] [address], [place], [place], [place]  [address] Phone: [phone]

## 2016-08-14 ENCOUNTER — Telehealth: Payer: Self-pay | Admitting: Cardiovascular Disease

## 2016-08-14 ENCOUNTER — Other Ambulatory Visit: Payer: Self-pay

## 2016-08-14 MED ORDER — TICAGRELOR 90 MG PO TABS: 90.0000 mg | ORAL_TABLET | Freq: Two times a day (BID) | ORAL | 3 refills | Status: DC

## 2016-08-14 NOTE — Telephone Encounter (Signed)
New message  Pt called, is running low on Brilinta 90mg   Need to fax in [place]   [facility]: [phone], opt. 2  Please call pt back to confirm

## 2016-10-15 ENCOUNTER — Encounter (HOSPITAL_COMMUNITY): Payer: Self-pay | Admitting: Emergency Medicine

## 2016-10-15 ENCOUNTER — Emergency Department (HOSPITAL_COMMUNITY): Payer: BLUE CROSS/BLUE SHIELD

## 2016-10-15 ENCOUNTER — Inpatient Hospital Stay (HOSPITAL_COMMUNITY)
Admission: EM | Admit: 2016-10-15 | Discharge: 2016-10-17 | DRG: 247 | Disposition: A | Discharge status: Home / Self Care | Payer: BLUE CROSS/BLUE SHIELD | Attending: Cardiovascular Disease | Admitting: Cardiovascular Disease

## 2016-10-15 DIAGNOSIS — I214 Non-ST elevation (NSTEMI) myocardial infarction: Principal | ICD-10-CM | POA: Diagnosis present

## 2016-10-15 DIAGNOSIS — I110 Hypertensive heart disease with heart failure: Secondary | ICD-10-CM | POA: Diagnosis present

## 2016-10-15 DIAGNOSIS — I200 Unstable angina: Secondary | ICD-10-CM

## 2016-10-15 DIAGNOSIS — I25110 Atherosclerotic heart disease of native coronary artery with unstable angina pectoris: Secondary | ICD-10-CM | POA: Diagnosis present

## 2016-10-15 DIAGNOSIS — D649 Anemia, unspecified: Secondary | ICD-10-CM | POA: Diagnosis present

## 2016-10-15 DIAGNOSIS — R079 Chest pain, unspecified: Secondary | ICD-10-CM | POA: Diagnosis not present

## 2016-10-15 DIAGNOSIS — Z7982 Long term (current) use of aspirin: Secondary | ICD-10-CM

## 2016-10-15 DIAGNOSIS — Z794 Long term (current) use of insulin: Secondary | ICD-10-CM

## 2016-10-15 DIAGNOSIS — Z6841 Body Mass Index (BMI) 40.0 and over, adult: Secondary | ICD-10-CM

## 2016-10-15 DIAGNOSIS — Z79899 Other long term (current) drug therapy: Secondary | ICD-10-CM

## 2016-10-15 DIAGNOSIS — I2510 Atherosclerotic heart disease of native coronary artery without angina pectoris: Secondary | ICD-10-CM | POA: Insufficient documentation

## 2016-10-15 DIAGNOSIS — Z7901 Long term (current) use of anticoagulants: Secondary | ICD-10-CM

## 2016-10-15 DIAGNOSIS — C8510 Unspecified B-cell lymphoma, unspecified site: Secondary | ICD-10-CM | POA: Diagnosis present

## 2016-10-15 DIAGNOSIS — Z955 Presence of coronary angioplasty implant and graft: Secondary | ICD-10-CM | POA: Diagnosis not present

## 2016-10-15 DIAGNOSIS — Z888 Allergy status to other drugs, medicaments and biological substances status: Secondary | ICD-10-CM

## 2016-10-15 DIAGNOSIS — E785 Hyperlipidemia, unspecified: Secondary | ICD-10-CM | POA: Diagnosis present

## 2016-10-15 DIAGNOSIS — E119 Type 2 diabetes mellitus without complications: Secondary | ICD-10-CM | POA: Diagnosis present

## 2016-10-15 DIAGNOSIS — Z9221 Personal history of antineoplastic chemotherapy: Secondary | ICD-10-CM

## 2016-10-15 DIAGNOSIS — Z87891 Personal history of nicotine dependence: Secondary | ICD-10-CM

## 2016-10-15 DIAGNOSIS — Y831 Surgical operation with implant of artificial internal device as the cause of abnormal reaction of the patient, or of later complication, without mention of misadventure at the time of the procedure: Secondary | ICD-10-CM | POA: Diagnosis present

## 2016-10-15 DIAGNOSIS — I252 Old myocardial infarction: Secondary | ICD-10-CM

## 2016-10-15 DIAGNOSIS — I5032 Chronic diastolic (congestive) heart failure: Secondary | ICD-10-CM | POA: Diagnosis present

## 2016-10-15 DIAGNOSIS — Z881 Allergy status to other antibiotic agents status: Secondary | ICD-10-CM

## 2016-10-15 DIAGNOSIS — T82855A Stenosis of coronary artery stent, initial encounter: Secondary | ICD-10-CM | POA: Diagnosis present

## 2016-10-15 HISTORY — DX: Type 2 diabetes mellitus without complications: E11.9

## 2016-10-15 HISTORY — DX: Pure hypercholesterolemia, unspecified: E78.00

## 2016-10-15 HISTORY — DX: Acute myocardial infarction, unspecified: I21.9

## 2016-10-15 LAB — HEPATIC FUNCTION PANEL
ALBUMIN: 3.7 g/dL (ref 3.5–5.0)
ALT: 19 U/L (ref 14–54)
AST: 17 U/L (ref 15–41)
Alkaline Phosphatase: 91 U/L (ref 38–126)
Bilirubin, Direct: <0.1 mg/dL — ABNORMAL LOW (ref 0.1–0.5)
Total Bilirubin: 0.5 mg/dL (ref 0.3–1.2)
Total Protein: 6.5 g/dL (ref 6.5–8.1)

## 2016-10-15 LAB — I-STAT TROPONIN, ED: TROPONIN I, POC: 0.15 ng/mL — AB (ref 0.00–0.08)

## 2016-10-15 LAB — BASIC METABOLIC PANEL
ANION GAP: 10 (ref 5–15)
BUN: 9 mg/dL (ref 6–20)
CALCIUM: 9.2 mg/dL (ref 8.9–10.3)
CO2: 27 mmol/L (ref 22–32)
Chloride: 101 mmol/L (ref 101–111)
Creatinine, Ser: 0.69 mg/dL (ref 0.44–1.00)
Glucose, Bld: 282 mg/dL — ABNORMAL HIGH (ref 65–99)
Potassium: 3.9 mmol/L (ref 3.5–5.1)
Sodium: 138 mmol/L (ref 135–145)

## 2016-10-15 LAB — CBC
HCT: 38.7 % (ref 36.0–46.0)
HEMOGLOBIN: 12.6 g/dL (ref 12.0–15.0)
MCH: 23.4 pg — AB (ref 26.0–34.0)
MCHC: 32.6 g/dL (ref 30.0–36.0)
MCV: 71.8 fL — ABNORMAL LOW (ref 78.0–100.0)
Platelets: 185 10*3/uL (ref 150–400)
RBC: 5.39 MIL/uL — AB (ref 3.87–5.11)
RDW: 14.2 % (ref 11.5–15.5)
WBC: 10.7 10*3/uL — AB (ref 4.0–10.5)

## 2016-10-15 LAB — GLUCOSE, CAPILLARY
GLUCOSE-CAPILLARY: 201 mg/dL — AB (ref 65–99)
GLUCOSE-CAPILLARY: 351 mg/dL — AB (ref 65–99)

## 2016-10-15 LAB — HEPARIN LEVEL (UNFRACTIONATED): Heparin Unfractionated: 0.17 IU/mL — ABNORMAL LOW (ref 0.30–0.70)

## 2016-10-15 MED ORDER — LOSARTAN POTASSIUM 50 MG PO TABS
100.0000 mg | ORAL_TABLET | Freq: Every day | ORAL | Status: DC
  Administered 2016-10-15 – 2016-10-17 (×3): 100 mg via ORAL
  Filled 2016-10-15 (×4): qty 2

## 2016-10-15 MED ORDER — OMEGA-3 FATTY ACIDS 1000 MG PO CAPS: 1.0000 g | ORAL_CAPSULE | Freq: Two times a day (BID) | ORAL | Status: DC

## 2016-10-15 MED ORDER — ATORVASTATIN CALCIUM 10 MG PO TABS
10.0000 mg | ORAL_TABLET | Freq: Every day | ORAL | Status: DC
  Administered 2016-10-16: 10 mg via ORAL
  Filled 2016-10-15: qty 1

## 2016-10-15 MED ORDER — TICAGRELOR 90 MG PO TABS
90.0000 mg | ORAL_TABLET | Freq: Two times a day (BID) | ORAL | Status: DC
  Administered 2016-10-15 – 2016-10-17 (×4): 90 mg via ORAL
  Filled 2016-10-15 (×4): qty 1

## 2016-10-15 MED ORDER — SODIUM CHLORIDE 0.9 % IV SOLN: 250.0000 mL | INTRAVENOUS | Status: DC | PRN

## 2016-10-15 MED ORDER — INSULIN ASPART 100 UNIT/ML ~~LOC~~ SOLN: 0.0000 [IU] | Freq: Every day | SUBCUTANEOUS | Status: DC

## 2016-10-15 MED ORDER — NITROGLYCERIN IN D5W 200-5 MCG/ML-% IV SOLN
0.0000 ug/min | INTRAVENOUS | Status: DC
  Administered 2016-10-15: 5 ug/min via INTRAVENOUS
  Filled 2016-10-15: qty 250

## 2016-10-15 MED ORDER — SODIUM CHLORIDE 0.9% FLUSH
3.0000 mL | Freq: Two times a day (BID) | INTRAVENOUS | Status: DC
  Administered 2016-10-16: 3 mL via INTRAVENOUS

## 2016-10-15 MED ORDER — HEPARIN (PORCINE) IN NACL 100-0.45 UNIT/ML-% IJ SOLN
1600.0000 [IU]/h | INTRAMUSCULAR | Status: DC
  Administered 2016-10-15: 1200 [IU]/h via INTRAVENOUS
  Administered 2016-10-16: 1600 [IU]/h via INTRAVENOUS
  Filled 2016-10-15 (×2): qty 250

## 2016-10-15 MED ORDER — ONDANSETRON HCL 4 MG/2ML IJ SOLN: 4.0000 mg | Freq: Four times a day (QID) | INTRAMUSCULAR | Status: DC | PRN

## 2016-10-15 MED ORDER — ASPIRIN 81 MG PO CHEW
324.0000 mg | CHEWABLE_TABLET | Freq: Once | ORAL | Status: AC
  Administered 2016-10-15: 324 mg via ORAL
  Filled 2016-10-15: qty 4

## 2016-10-15 MED ORDER — NITROGLYCERIN 0.4 MG SL SUBL: 0.4000 mg | SUBLINGUAL_TABLET | SUBLINGUAL | Status: DC | PRN

## 2016-10-15 MED ORDER — SODIUM CHLORIDE 0.9% FLUSH: 3.0000 mL | INTRAVENOUS | Status: DC | PRN

## 2016-10-15 MED ORDER — ASPIRIN 81 MG PO CHEW
81.0000 mg | CHEWABLE_TABLET | Freq: Every day | ORAL | Status: DC
  Administered 2016-10-17: 81 mg via ORAL
  Filled 2016-10-15 (×2): qty 1

## 2016-10-15 MED ORDER — METOPROLOL TARTRATE 25 MG PO TABS
50.0000 mg | ORAL_TABLET | Freq: Two times a day (BID) | ORAL | Status: DC
  Administered 2016-10-15 – 2016-10-17 (×4): 50 mg via ORAL
  Filled 2016-10-15: qty 2
  Filled 2016-10-15 (×2): qty 1
  Filled 2016-10-15: qty 2

## 2016-10-15 MED ORDER — PANTOPRAZOLE SODIUM 40 MG PO TBEC
40.0000 mg | DELAYED_RELEASE_TABLET | Freq: Every day | ORAL | Status: DC
  Administered 2016-10-15 – 2016-10-17 (×3): 40 mg via ORAL
  Filled 2016-10-15 (×3): qty 1

## 2016-10-15 MED ORDER — ASPIRIN 81 MG PO CHEW
81.0000 mg | CHEWABLE_TABLET | ORAL | Status: AC
  Administered 2016-10-16: 81 mg via ORAL
  Filled 2016-10-15: qty 1

## 2016-10-15 MED ORDER — SODIUM CHLORIDE 0.9 % WEIGHT BASED INFUSION
3.0000 mL/kg/h | INTRAVENOUS | Status: DC
  Administered 2016-10-16: 3 mL/kg/h via INTRAVENOUS

## 2016-10-15 MED ORDER — SODIUM CHLORIDE 0.9 % WEIGHT BASED INFUSION
1.0000 mL/kg/h | INTRAVENOUS | Status: DC
  Administered 2016-10-16: 1 mL/kg/h via INTRAVENOUS

## 2016-10-15 MED ORDER — PRENATAL 27-0.8 MG PO TABS
2.0000 | ORAL_TABLET | Freq: Every day | ORAL | Status: DC
  Filled 2016-10-15 (×2): qty 2

## 2016-10-15 MED ORDER — INSULIN ASPART 100 UNIT/ML ~~LOC~~ SOLN
0.0000 [IU] | Freq: Every day | SUBCUTANEOUS | Status: DC
  Administered 2016-10-15: 5 [IU] via SUBCUTANEOUS
  Administered 2016-10-16: 3 [IU] via SUBCUTANEOUS

## 2016-10-15 MED ORDER — INSULIN ASPART 100 UNIT/ML ~~LOC~~ SOLN
0.0000 [IU] | Freq: Three times a day (TID) | SUBCUTANEOUS | Status: DC
  Administered 2016-10-16: 8 [IU] via SUBCUTANEOUS
  Administered 2016-10-16: 5 [IU] via SUBCUTANEOUS
  Administered 2016-10-16: 19:00:00 2 [IU] via SUBCUTANEOUS
  Administered 2016-10-17: 07:00:00 3 [IU] via SUBCUTANEOUS

## 2016-10-15 MED ORDER — DIPHENHYDRAMINE HCL 50 MG/ML IJ SOLN: 25.0000 mg | Freq: Four times a day (QID) | INTRAMUSCULAR | Status: DC | PRN

## 2016-10-15 MED ORDER — ACETAMINOPHEN 325 MG PO TABS
650.0000 mg | ORAL_TABLET | ORAL | Status: DC | PRN
  Administered 2016-10-15 – 2016-10-16 (×3): 650 mg via ORAL
  Filled 2016-10-15 (×3): qty 2

## 2016-10-15 MED ORDER — HEPARIN BOLUS VIA INFUSION
3000.0000 [IU] | Freq: Once | INTRAVENOUS | Status: AC
Start: 2016-10-15 — End: 2016-10-15
  Administered 2016-10-15: 3000 [IU] via INTRAVENOUS
  Filled 2016-10-15: qty 3000

## 2016-10-15 MED ORDER — GI COCKTAIL ~~LOC~~
30.0000 mL | Freq: Once | ORAL | Status: AC
  Administered 2016-10-15: 30 mL via ORAL
  Filled 2016-10-15: qty 30

## 2016-10-15 MED ORDER — INSULIN ASPART 100 UNIT/ML ~~LOC~~ SOLN: 0.0000 [IU] | Freq: Three times a day (TID) | SUBCUTANEOUS | Status: DC

## 2016-10-15 MED ORDER — HEPARIN BOLUS VIA INFUSION
4000.0000 [IU] | Freq: Once | INTRAVENOUS | Status: AC
  Administered 2016-10-15: 4000 [IU] via INTRAVENOUS
  Filled 2016-10-15: qty 4000

## 2016-10-15 MED ORDER — OMEGA-3-ACID ETHYL ESTERS 1 G PO CAPS
1.0000 g | ORAL_CAPSULE | Freq: Two times a day (BID) | ORAL | Status: DC
  Administered 2016-10-15 – 2016-10-17 (×4): 1 g via ORAL
  Filled 2016-10-15 (×4): qty 1

## 2016-10-15 MED ORDER — DIPHENHYDRAMINE HCL 25 MG PO CAPS
25.0000 mg | ORAL_CAPSULE | Freq: Four times a day (QID) | ORAL | Status: DC | PRN
  Administered 2016-10-15 – 2016-10-16 (×2): 25 mg via ORAL
  Filled 2016-10-15 (×2): qty 1

## 2016-10-15 MED ORDER — NITROGLYCERIN IN D5W 200-5 MCG/ML-% IV SOLN: 0.0000 ug/min | INTRAVENOUS | Status: DC

## 2016-10-15 NOTE — ED Provider Notes (Signed)
[facility] DEPT Provider Note   CSN: [id number] Arrival date & time: 10/15/16  1029     History   Chief Complaint Chief Complaint  Patient presents with  . Chest Pain    HPI Oluwanifemi Petitti is a 51 y.o. female.  HPI  Saturday started to feel indigestion and yesterday morning was walking downtown, felt out of breath and chest pain, subsided, took 30-40 minutes after not walking it subsided  This AM ate and after pain started under left arm to left chest, had dyspnea with walking up the steps, then was worse with walking and radiates to the back. Constant warm feeling and heaviness in chest. Don't feel dyspnea unless walking. Pain was severe, now resolved.  Better with rest however still present.  No hx of DVT/PE, no hx of leg pain, no recent surgeries, long trips No hx of cigarette smoking Had heart attack in June 2017, had stent, Dr. Claiborne Billings Cardiologist Furosemide 2-3 times per week,  For leg swelling     Past Medical History:  Diagnosis Date  . 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 Fatigue 11/17/2013  . High cholesterol   . Hypertension   . Iron deficiency anemia due to chronic blood loss   . Leukocytosis 07/27/2011  . Myocardial infarction 03/2016  . Non Hodgkin's lymphoma ([place])    recieving chemo 06/2014  . Palpitation 06/30/2014  . 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 Tachycardia with 100 - 120 beats per minute 11/29/2014  . Type II diabetes mellitus [facility])     Patient Active Problem List   Diagnosis Date Noted  . CAD in native artery   . Unstable angina ([place])   . Morbid obesity ([place])   . Hyperlipidemia LDL goal <70 08/07/2016  . Acute chest syndrome ([place]) 04/09/2016  . NSTEMI (non-ST elevated myocardial infarction) ([place])   . Chest pain syndrome 04/08/2016  . Dyslipidemia 04/08/2016  . Pulmonary nodule, right 04/08/2016  . Pain in the chest   . Diabetes mellitus with complication ([place])   . Microcytosis 05/30/2015    . Tachycardia with 100 - 120 beats per minute 11/29/2014  . Fatty liver disease, nonalcoholic 14/43/1540  . Tachycardia 07/03/2014  . Palpitation 06/30/2014  . Acne 06/30/2014  . Hypersensitivity reaction 06/21/2014  . 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 Fatigue 11/17/2013  . Iron deficiency anemia due to chronic blood loss   . Hypertension   . Leukocytosis 07/27/2011  . Splenic marginal zone b-cell lymphoma ([place]) 01/11/2009  . 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 Diabetes mellitus type 2, uncontrolled ([place]) 08/20/2007  . OTHER DRUG ALLERGY 08/20/2007    Past Surgical History:  Procedure Laterality Date  . BONE MARROW BIOPSY  0215 X 2  . CARDIAC CATHETERIZATION N/A 04/09/2016   Procedure: Left Heart Cath and Coronary Angiography;  Surgeon: Peter M Martinique, MD;  Location: [facility];  Service: Cardiovascular;  Laterality: N/A;  . CARDIAC CATHETERIZATION N/A 04/09/2016   Procedure: Coronary Stent Intervention;  Surgeon: Peter M Martinique, MD;  Location: [facility];  Service: Cardiovascular;  Laterality: N/A;  . WISDOM TOOTH EXTRACTION      OB History    No data available       Home Medications    Prior to Admission medications   Medication Sig Start Date End Date Taking? Authorizing Provider  acetaminophen (TYLENOL) 500 MG tablet Take 1 tablet (500 mg total) by mouth every 8 (eight) hours as needed (pain). 04/10/16  Yes Florencia Reasons, MD  aspirin 81 MG chewable tablet Chew 1 tablet (81 mg total) by mouth daily. 04/10/16  Yes Florencia Reasons, MD  atorvastatin (LIPITOR) 10 MG tablet Take 1 tablet (10 mg total) by mouth daily at 6 PM. 05/12/16  Yes Troy Sine, MD  fish oil-omega-3 fatty acids 1000 MG capsule Take 1 g by mouth 2 (two) times daily.    Yes Historical Provider, MD  furosemide (LASIX) 40 MG tablet Take 40 mg by mouth daily as needed for fluid (leg swelling).    Yes Historical Provider, MD  insulin aspart (NOVOLOG FLEXPEN) 100 UNIT/ML FlexPen Before each meal 3 times a day, 140-199  - 2 units, 200-250 - 4 units, 251-299 - 6 units,  300-349 - 8 units,  350 or above 10 units. Insulin PEN if approved, provide syringes and needles if needed. 04/10/16  Yes Florencia Reasons, MD  losartan (COZAAR) 100 MG tablet Take 100 mg by mouth daily. 12/10/15  Yes Historical Provider, MD  metFORMIN (GLUCOPHAGE) 1000 MG tablet Take 1,000 mg by mouth 2 (two) times daily with a meal.     Yes Historical Provider, MD  metoprolol tartrate (LOPRESSOR) 25 MG tablet Take 1 tablet (25 mg total) by mouth 2 (two) times daily. 08/05/16  Yes Troy Sine, MD  nitroGLYCERIN (NITROSTAT) 0.4 MG SL tablet Place 1 tablet (0.4 mg total) under the tongue every 5 (five) minutes as needed for chest pain. 04/10/16  Yes Florencia Reasons, MD  Potassium Chloride ER 20 MEQ TBCR Take 20 mEq by mouth daily.  05/29/16  Yes Historical Provider, MD  Prenatal Vit-Fe Fumarate-FA (MULTIVITAMIN-PRENATAL) 27-0.8 MG TABS tablet Take 2 tablets by mouth daily at 12 noon.   Yes Historical Provider, MD  ranitidine (ZANTAC) 150 MG tablet Take 1 tablet (150 mg total) by mouth 2 (two) times daily. 05/12/16  Yes Troy Sine, MD  ticagrelor (BRILINTA) 90 MG TABS tablet Take 1 tablet (90 mg total) by mouth 2 (two) times daily. 08/14/16  Yes Troy Sine, MD  valACYclovir (VALTREX) 500 MG tablet Take 500 mg by mouth 2 (two) times daily as needed. Takes for outbreaks only. 05/02/14  Yes Historical Provider, MD    Family History Family History  Problem Relation Age of Onset  . Cancer Maternal Aunt     breast ca  . Cancer Maternal Uncle     lung ca    Social History Social History  Substance Use Topics  . Smoking status: Former Smoker    Packs/day: 0.50    Years: 22.00    Types: Cigarettes    Quit date: 10/13/2005  . Smokeless tobacco: Never Used  . Alcohol use Yes     Comment: 10/15/2016 "nothing in the last 3 years"     Allergies   Erythromycin; Lovastatin; and Tolnaftate   Review of Systems Review of Systems  Constitutional: Positive for  diaphoresis. Negative for fever.  HENT: Negative for sore throat.   Eyes: Negative for visual disturbance.  Respiratory: Positive for shortness of breath. Negative for cough.   Cardiovascular: Positive for chest pain.  Gastrointestinal: Positive for nausea. Negative for abdominal pain, diarrhea and vomiting.  Genitourinary: Negative for difficulty urinating.  Musculoskeletal: Negative for back pain and neck pain.  Skin: Negative for rash.  Neurological: Negative for syncope and headaches.     Physical Exam Updated Vital Signs BP 120/84   Pulse (!) 103   Temp 98.2 F (36.8 C) (Axillary) Comment (Src): just took medicine  Resp 20   Ht '5\' 9"'  (1.753 m)   Wt 265 lb 3.2 oz (120.3 kg)   SpO2 98%   BMI 39.16 kg/m   Physical Exam  Constitutional: She is oriented to person, place, and time. She appears well-developed and well-nourished. No distress.  HENT:  Head: Normocephalic and atraumatic.  Eyes: Conjunctivae and EOM are normal.  Neck: Normal range of motion.  Cardiovascular: Normal rate, regular rhythm, normal heart sounds and intact distal pulses.  Exam reveals no gallop and no friction rub.   No murmur heard. Pulmonary/Chest: Effort normal and breath sounds normal. No respiratory distress. She has no wheezes. She has no rales.  Abdominal: Soft. She exhibits no distension. There is no tenderness. There is no guarding.  Musculoskeletal: She exhibits no edema or tenderness.  Neurological: She is alert and oriented to person, place, and time.  Skin: Skin is warm and dry. No rash noted. She is not diaphoretic. No erythema.  Nursing note and vitals reviewed.    ED Treatments / Results  Labs (all labs ordered are listed, but only abnormal results are displayed) Labs Reviewed  BASIC METABOLIC PANEL - Abnormal; Notable for the following:       Result Value   Glucose, Bld 282 (*)    All other components within normal limits  CBC - Abnormal; Notable for the following:    WBC 10.7  (*)    RBC 5.39 (*)    MCV 71.8 (*)    MCH 23.4 (*)    All other components within normal limits  HEPATIC FUNCTION PANEL - Abnormal; Notable for the following:    Bilirubin, Direct <0.1 (*)    All other components within normal limits  HEPARIN LEVEL (UNFRACTIONATED) - Abnormal; Notable for the following:    Heparin Unfractionated 0.17 (*)    All other components within normal limits  GLUCOSE, CAPILLARY - Abnormal; Notable for the following:    Glucose-Capillary 201 (*)    All other components within normal limits  GLUCOSE, CAPILLARY - Abnormal; Notable for the following:    Glucose-Capillary 351 (*)    All other components within normal limits  I-STAT TROPOININ, ED - Abnormal; Notable for the following:    Troponin i, poc 0.15 (*)    All other components within normal limits  CBC  COMPREHENSIVE METABOLIC PANEL  LIPID PANEL  HEPARIN LEVEL (UNFRACTIONATED)    EKG  EKG Interpretation  Date/Time:  Wednesday October 15 2016 10:38:03 EST Ventricular Rate:  114 PR Interval:  126 QRS Duration: 78 QT Interval:  328 QTC Calculation: 452 R Axis:   88 Text Interpretation:  Sinus tachycardia Lateral infarct , age undetermined ST & T wave abnormality, consider inferior ischemia Abnormal ECG Inferior TW changes are new in comparison to prior Confirmed by [facility] MD, Mills Mitton ([id number]) on 10/15/2016 12:20:43 PM       Radiology Dg Chest 2 View  Result Date: 10/15/2016 CLINICAL DATA:  Chest pain. EXAM: CHEST  2 VIEW COMPARISON:  CT 04/08/2016.  Chest x-ray 04/08/2016. FINDINGS: Mediastinum Hilar structures normal. Lungs are clear. Heart size normal. Previously identified tiny 5 mm nodule again noted over the right lung base . No pleural effusion or pneumothorax. No focal bony abnormality. IMPRESSION: 1. Previously identified tiny 5 mm pulmonary nodule right lung base again noted. 2. Low lung volumes. Electronically Signed   By: Marcello Moores  Register   On: 10/15/2016 11:52    Procedures Procedures  (including critical care time)  Medications Ordered in ED Medications  heparin ADULT infusion 100 units/mL (25000 units/260m sodium chloride 0.45%) (1,500 Units/hr Intravenous Rate/Dose Change 10/15/16 2121)  sodium chloride flush (NS) 0.9 % injection 3 mL (not administered)  sodium chloride flush (NS) 0.9 % injection 3 mL (not administered)  0.9 %  sodium chloride infusion (not administered)  aspirin chewable tablet 81 mg (not administered)  0.9% sodium chloride infusion (not administered)    Followed by  0.9% sodium chloride infusion (not administered)  multivitamin-prenatal tablet 2 tablet (not administered)  ticagrelor (BRILINTA) tablet 90 mg (90 mg Oral Given 10/15/16 2125)  metoprolol (LOPRESSOR) tablet 50 mg (50 mg Oral Given 10/15/16 2125)  atorvastatin (LIPITOR) tablet 10 mg (not administered)  aspirin chewable tablet 81 mg (0 mg Oral Duplicate 10/3/5010938  losartan (COZAAR) tablet 100 mg (100 mg Oral Given 10/15/16 2124)  nitroGLYCERIN (NITROSTAT) SL tablet 0.4 mg (not administered)  acetaminophen (TYLENOL) tablet 650 mg (650 mg Oral Given 10/15/16 1939)  ondansetron (ZOFRAN) injection 4 mg (not administered)  nitroGLYCERIN 50 mg in dextrose 5 % 250 mL (0.2 mg/mL) infusion (15 mcg/min Intravenous Rate/Dose Change 10/15/16 2144)  pantoprazole (PROTONIX) EC tablet 40 mg (40 mg Oral Given 10/15/16 2124)  diphenhydrAMINE (BENADRYL) capsule 25 mg (25 mg Oral Given 10/15/16 2125)    Or  diphenhydrAMINE (BENADRYL) injection 25 mg ( Intramuscular See Alternative 10/15/16 2125)  omega-3 acid ethyl esters (LOVAZA) capsule 1 g (1 g Oral Given 10/15/16 2125)  insulin aspart (novoLOG) injection 0-15 Units (not administered)  insulin aspart (novoLOG) injection 0-5 Units (5 Units Subcutaneous Given 10/15/16 2256)  aspirin chewable tablet 324 mg (324 mg Oral Given 10/15/16 1300)  heparin bolus via infusion 4,000 Units (4,000 Units Intravenous Bolus from Bag 10/15/16 1332)  gi cocktail (Maalox,Lidocaine,Donnatal)  (30 mLs Oral Given 10/15/16 1412)  heparin bolus via infusion 3,000 Units (3,000 Units Intravenous Bolus from Bag 10/15/16 2121)     Initial Impression / Assessment and Plan / ED Course  I have reviewed the triage vital signs and the nursing notes.  Pertinent labs & imaging results that were available during my care of the patient were reviewed by me and considered in my medical decision making (see chart for details).  Clinical Course    89yofemale with history of DM, htn, hlpd, marginal zone b-cell lymphoma under observation, chronic tachycardia, CAD, presents with concern for chest pain. CP similar to prior MI. No asymmetric leg pain or swelling, no dyspnea at rest, no hypoxia, and pt with chronic tachycardia and doubt PE.  Hx, inferior EKG changes and positive troponin .15 concerning for angina and NSTEMI. Pt given ASA,placed on heparin and Cardiology admitted and will admit pt.     Final Clinical Impressions(s) / ED Diagnoses   Final diagnoses:  NSTEMI (non-ST elevated myocardial infarction) ([facility]    New Prescriptions Current Discharge Medication List       EGareth Morgan MD 10/15/16 2354

## 2016-10-15 NOTE — Progress Notes (Signed)
MD on call notified that pt is diabetic & has a cbg of 351 but no sliding scale insulin is ordered. Will continue to monitor the pt. Hoover Brunette, RN

## 2016-10-15 NOTE — ED Notes (Signed)
Attempted report 

## 2016-10-15 NOTE — Progress Notes (Signed)
ANTICOAGULATION CONSULT NOTE - Initial Consult  Pharmacy Consult for heparin Indication: chest pain/ACS  Allergies  Allergen Reactions  . Erythromycin Anaphylaxis    Swelling and difficulty breathing.  . Lovastatin     Leg cramps  . Tolnaftate Hives    Reaction caused by pills and the cream.    Patient Measurements: Height: 5\' 9"  (175.3 cm) (per last visit) Weight: 270 lb 11.6 oz (122.8 kg) (per last visit) IBW/kg (Calculated) : 66.2 Heparin Dosing Weight: 95kg  Vital Signs: Temp: 97.6 F (36.4 C) (01/03 1042) Temp Source: Oral (01/03 1042) BP: 135/90 (01/03 1042) Pulse Rate: 116 (01/03 1042)  Labs:  Recent Labs  10/15/16 1102  HGB 12.6  HCT 38.7  PLT 185  CREATININE 0.69    Estimated Creatinine Clearance: 119.2 mL/min (by C-G formula based on SCr of 0.69 mg/dL).   Medical History: Past Medical History:  Diagnosis Date  . 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 Diabetes mellitus without complication ([place])   . Fatigue 11/17/2013  . Hypertension   . Iron deficiency anemia due to chronic blood loss   . Leukocytosis 07/27/2011  . Non Hodgkin's lymphoma ([place])    recieving chemo 06/2014  . Palpitation 06/30/2014  . 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 Tachycardia with 100 - 120 beats per minute 11/29/2014    Medications:  Infusions:  . heparin      Assessment: 38 yof presented to the ED with SOB and CP. Found to have elevated troponins. To start IV heparin. She is not on anticoagulation PTA. Baseline H/H and platelets are WNL.   Goal of Therapy:  Heparin level 0.3-0.7 units/ml Monitor platelets by anticoagulation protocol: Yes   Plan:  Heparin bolus 4000 units IV x 1 Heparin gtt 1200 units/hr Check a 6 hr heparin level Daily heparin level and CBC  Drewey Begue, Rande Lawman 10/15/2016,1:17 PM

## 2016-10-15 NOTE — ED Triage Notes (Signed)
Indigestion since sat then it would ease up then yesterday she was having sob with the cp and since she had MI  In June and has a stent placed  She thought she needed to come in

## 2016-10-15 NOTE — ED Notes (Signed)
Diet tray ordered for pt 

## 2016-10-15 NOTE — Progress Notes (Signed)
ANTICOAGULATION CONSULT NOTE - Initial Consult  Pharmacy Consult for heparin Indication: chest pain/ACS  Allergies  Allergen Reactions  . Erythromycin Anaphylaxis    Swelling and difficulty breathing.  . Lovastatin     Leg cramps  . Tolnaftate Hives    Reaction caused by pills and the cream.    Patient Measurements: Height: 5\' 9"  (175.3 cm) Weight: 265 lb 3.2 oz (120.3 kg) IBW/kg (Calculated) : 66.2 Heparin Dosing Weight: 95kg  Assessment: 70 yof presented to the ED with SOB and CP. Found to have elevated troponins. To start IV heparin. She is not on anticoagulation PTA. Baseline H/H and platelets are WNL. First heparin level is low at 0.17. No issues documented.  Goal of Therapy:  Heparin level 0.3-0.7 units/ml Monitor platelets by anticoagulation protocol: Yes   Plan:  Give 3,000 unit heparin bolus Increase heparin gtt to 1,500 units/hr Check 6 hr heparin level Monitor daily heparin level, CBC, s/s of bleed  Elenor Quinones, PharmD, [facility] Clinical Pharmacist Pager [phone] 10/15/2016 8:23 PM

## 2016-10-15 NOTE — Progress Notes (Signed)
MD paged concerning if there is a need for IV nitro. Awaiting further orders. Pt CP free

## 2016-10-15 NOTE — H&P (Addendum)
Patient ID: Desiree [name] MRN: [id number], DOB/AGE: [birth year]   Admit date: 10/15/2016   Primary Physician: Rogers Blocker, MD Primary Cardiologist: Dr. Claiborne Billings  Pt. Profile:  Desiree [name] is a 51 y.o. 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   HPI: As above. She was recently admitted from 6/27-6/29/17 for chest pain. She ruled in for NSTEMI (Troponin 0.82-->0.73-->0.79). Cardiac catheterization revealed 85% focal stenosis in the mid LAD for which she underwent successful stenting with a Promus premier 2.2512 mm DES stent.  She had mild concomitant CAD with 20% irregularity in the mid RCA and 50% PDA stenoses.  An echo Doppler study showed an EF of 55-60% with mild LVH and Grade 1 diastolic dysfunction.  He had a history of leg cramping with lovastatin and was trialed on low-dose Lipitor. Taking PRN lasix few times a day for LE edema.   Presented to [facility] ER for evaluation of chest pain. Patient states that she felt indigestion since Saturday 12/30. She felt burping sensation after eating anything. No vomiting. She takes Zantac daily and has also tried over-the-counter Tums without any improvement. Yesterday while at work patient had a chest pain while walking. She described the pain as a pressure sensation underneath her left breast radiating underneath her axilla. Associated with shortness of breath. She sat down and symptoms resolved within 30 minutes. She again had a reoccurrence of chest pressure this morning while going up and downstairs at home. This time her symptoms started at least 40 minutes with resolution at rest. She again had a reoccurrence of pain while taking a shower leading to ER presentation for further evaluation. She has not tried any nitroglycerin.  Compliant with her medication. This episode is similar to her prior cardiac pain when she had a stent placement however with less intensity. Patient denies any dizziness, PND, syncope, melena, blood in her stool or urine. No fever or chills. Admits to having intermittent lower extremity edema which improved his when necessary Lasix.  In ER, point-of-care troponin 0.15. Lytes normal. Chest x-ray without acute finding. EKG shows sinus rhythm with ST/T-wave changes in the inferior lateral lead. Each appears new compared to prior EKG of 08/08/16. IV heparin started by ED provider.  Problem List  Past Medical History:  Diagnosis Date  . 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 Diabetes mellitus without complication ([place])   . Fatigue 11/17/2013  . Hypertension   . Iron deficiency anemia due to chronic blood loss   . Leukocytosis 07/27/2011  . Non Hodgkin's lymphoma ([place])    recieving chemo 06/2014  . Palpitation 06/30/2014  . Splenic marginal zone b-cell lymphoma ([place]) 01/2009   On observation  . Tachycardia with 100 - 120 beats per minute 11/29/2014    Past Surgical History:  Procedure Laterality Date  . CARDIAC CATHETERIZATION N/A 04/09/2016   Procedure: Left Heart Cath and Coronary Angiography;  Surgeon: Peter M Martinique, MD;  Location: [place] [facility];  Service: Cardiovascular;  Laterality: N/A;  . CARDIAC CATHETERIZATION N/A 04/09/2016   Procedure: Coronary Stent Intervention;  Surgeon: Peter M Martinique, MD;  Location: [place] [facility];  Service: Cardiovascular;  Laterality: N/A;  . STENT PLACEMENT VASCULAR ([place] HX)    . WISDOM TOOTH EXTRACTION       Allergies  Allergies  Allergen Reactions  . Erythromycin Anaphylaxis    Swelling and difficulty breathing.  . Lovastatin     Leg cramps  . Tolnaftate Hives    Reaction caused by pills and the cream.     Home Medications  Prior to Admission medications   Medication Sig Start Date End Date Taking? Authorizing Provider    acetaminophen (TYLENOL) 500 MG tablet Take 1 tablet (500 mg total) by mouth every 8 (eight) hours as needed (pain). 04/10/16   Florencia Reasons, MD  aspirin 81 MG chewable tablet Chew 1 tablet (81 mg total) by mouth daily. 04/10/16   Florencia Reasons, MD  atorvastatin (LIPITOR) 10 MG tablet Take 1 tablet (10 mg total) by mouth daily at 6 PM. 05/12/16   Troy Sine, MD  calcium & magnesium carbonates (MYLANTA) [id number] MG tablet Take 1 tablet by mouth as directed.     Historical Provider, MD  fish oil-omega-3 fatty acids 1000 MG capsule Take 1 g by mouth 2 (two) times daily.     Historical Provider, MD  furosemide (LASIX) 40 MG tablet Take 40 mg by mouth every other day.    Historical Provider, MD  insulin aspart (NOVOLOG FLEXPEN) 100 UNIT/ML FlexPen Before each meal 3 times a day, 140-199 - 2 units, 200-250 - 4 units, 251-299 - 6 units,  300-349 - 8 units,  350 or above 10 units. Insulin PEN if approved, provide syringes and needles if needed. 04/10/16   Florencia Reasons, MD  losartan (COZAAR) 100 MG tablet Take 100 mg by mouth daily. 12/10/15   Historical Provider, MD  metFORMIN (GLUCOPHAGE) 1000 MG tablet Take 1,000 mg by mouth 2 (two) times daily with a meal.      Historical Provider, MD  metoprolol tartrate (LOPRESSOR) 25 MG tablet Take 1 tablet (25 mg total) by mouth 2 (two) times daily. 08/05/16   Troy Sine, MD  nitroGLYCERIN (NITROSTAT) 0.4 MG SL tablet Place 1 tablet (0.4 mg total) under the tongue every 5 (five) minutes as needed for chest pain. 04/10/16   Florencia Reasons, MD  Potassium Chloride ER 20 MEQ TBCR Take 20 mEq by mouth every other day. 05/29/16   Historical Provider, MD  ranitidine (ZANTAC) 150 MG tablet Take 1 tablet (150 mg total) by mouth 2 (two) times daily. 05/12/16   Troy Sine, MD  ticagrelor (BRILINTA) 90 MG TABS tablet Take 1 tablet (90 mg total) by mouth 2 (two) times daily. 08/14/16   Troy Sine, MD  valACYclovir (VALTREX) 500 MG tablet Take 500 mg by mouth 2 (two) times daily as needed. Takes  for outbreaks only. 05/02/14   Historical Provider, MD    Family History  Family History  Problem Relation Age of Onset  . Cancer Maternal Aunt     breast ca  . Cancer Maternal Uncle     lung ca   Family Status  Relation Status  . Mother Alive  . Father Alive  . Maternal Aunt   . Maternal Uncle     Social History  Social History   Social History  . Marital status: Married    Spouse name: N/A  . Number of children: N/A  . Years of education: N/A   Occupational History  . Not on file.   Social History Main Topics  . Smoking status: Former Smoker    Quit date: 10/13/2005  . Smokeless tobacco: Never Used  . Alcohol use No  . Drug use: No  . Sexual activity: Yes  Birth control/ protection: None   Other Topics Concern  . Not on file   Social History Narrative  . No narrative on file      All other systems reviewed and are otherwise negative except as noted above.  Physical Exam  Blood pressure 127/87, pulse 106, temperature 97.6 F (36.4 C), temperature source Oral, resp. rate 14, height 5\' 9"  (1.753 m), weight 270 lb 11.6 oz (122.8 kg), SpO2 100 %.  General: Pleasant, Morbidly obese female in NAD Psych: Normal affect. Neuro: Alert and oriented X 3. Moves all extremities spontaneously. HEENT: Normal  Neck: Supple without bruits or JVD. Lungs:  Resp regular and unlabored, CTA. Tender to palpation underneath her left breast. Heart: RRR no s3, s4, or murmurs. Abdomen: Soft, non-tender, non-distended, BS + x 4.  Extremities: No clubbing, cyanosis. Trace BL LE edema. DP/PT/Radials 2+ and equal bilaterally.  Labs  No results for input(s): CKTOTAL, CKMB, TROPONINI in the last 72 hours. Lab Results  Component Value Date   WBC 10.7 (H) 10/15/2016   HGB 12.6 10/15/2016   HCT 38.7 10/15/2016   MCV 71.8 (L) 10/15/2016   PLT 185 10/15/2016    Recent Labs Lab 10/15/16 1102  NA 138  K 3.9  CL 101  CO2 27  BUN 9  CREATININE 0.69  CALCIUM 9.2  GLUCOSE  282*   Lab Results  Component Value Date   CHOL 186 04/09/2016   HDL 30 (L) 04/09/2016   LDLCALC 125 (H) 04/09/2016   TRIG 155 (H) 04/09/2016   No results found for: DDIMER   Radiology/Studies  Dg Chest 2 View  Result Date: 10/15/2016 CLINICAL DATA:  Chest pain. EXAM: CHEST  2 VIEW COMPARISON:  CT 04/08/2016.  Chest x-ray 04/08/2016. FINDINGS: Mediastinum Hilar structures normal. Lungs are clear. Heart size normal. Previously identified tiny 5 mm nodule again noted over the right lung base . No pleural effusion or pneumothorax. No focal bony abnormality. IMPRESSION: 1. Previously identified tiny 5 mm pulmonary nodule right lung base again noted. 2. Low lung volumes. Electronically Signed   By: Marcello Moores  Register   On: 10/15/2016 11:52    Echo 04/10/16 LV EF: 55% -   60%  ------------------------------------------------------------------- Indications:      CAD of native vessels 414.01. S/P LAD Stent.  ------------------------------------------------------------------- History:   PMH:  Lymphoma B-Cell.  Risk factors:  Hypertension. Diabetes mellitus. Dyslipidemia.  ------------------------------------------------------------------- Study Conclusions  - Left ventricle: The cavity size was normal. Wall thickness was   increased in a pattern of mild LVH. There was moderate focal   basal hypertrophy of the septum. Systolic function was normal.   The estimated ejection fraction was in the range of 55% to 60%.   Wall motion was normal; there were no regional wall motion   abnormalities. Doppler parameters are consistent with abnormal   left ventricular relaxation (grade 1 diastolic dysfunction). The   E/e&' ratio is between 8-15, suggesting indeterminate LV filling   pressure. - Left atrium: The atrium was normal in size. - Tricuspid valve: There was mild regurgitation. - Pulmonary arteries: PA peak pressure: 30 mm Hg (S). - Inferior vena cava: The vessel was normal in size. The    respirophasic diameter changes were in the normal range (>= 50%),   consistent with normal central venous pressure.  Impressions:  - Compared to a prior echo report in 2010, the LVEF is stable at   55-60%.  Cath 04/09/16 Coronary Stent Intervention  Left Heart Cath and Coronary Angiography  Conclusion    Mid RCA to Dist RCA lesion, 20% stenosed.  RPDA lesion, 50% stenosed.  The left ventricular systolic function is normal.  Mid LAD to Dist LAD lesion, 85% stenosed. Post intervention, there is a 0% residual stenosis.   1. 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 2. Normal LV function 3. Successful stenting of the LAD with DES  Plan: DAPT for one year. Possible DC in am. Risk factor modification.    ASSESSMENT AND PLAN  1. Chest pressure with elevated troponin - Has both typical and atypical features. More concerning for anginal pain. Similar to prior cardiac pain when she had a stent placement however with less intensity. Point-of-care troponin 0.15. EKG with new ST/T-wave changes in inferior lateral lead. chest pain-free. IV heparin started by ED provider. He also given GI cocktail for possible GI etiology. - Admit and continue cycle troponin and IV heparin.  - Continue aspirin, brillinta, metoprolol (increase to 50mg  BID to tachycardia), statin  and losartan. Continue Iv heparin. Start IV nitro.  Cath tomorrow.   The patient understands that risks include but are not limited to stroke (1 in 1000), death (1 in 44), kidney failure [usually temporary] (1 in 500), bleeding (1 in 200), allergic reaction [possibly serious] (1 in 200), and agrees to proceed.   2. Chronic diastolic CHF - She takes Lasix every other day for intermittent lower extremity edema. Noted trace edema today as well. Chest x-ray without evidence of CHF. She will likely need a small dose of daily Lasix.  3. Hypertension - Stable and well controlled on current  regimen  4. HLD - 04/09/2016: Cholesterol 186; HDL 30; LDL Cholesterol 125; Triglycerides 155; VLDL 31  - She is due to get her lipid panel and LFT. We will update here. He had a history of leg cramping with lovastatin and was trialed on low-dose Lipitor at 10mg  qd. Might need up-titration this admission.   5. DM - Will place her on SSI.   6. Splenic marginal zone b-cell lymphoma  - Followed by Dr. Alvy Bimler. In remission.    Signed, Leanor Kail, PA-C 10/15/2016, 1:45 PM Pager [phone]  Patient seen and examined. Agree with assessment and plan. Desiree [name] is a 51 y.o. female who has a history of type 2 diabetes mellitus, obesity, splenic marginal zone B-cell lymphoma in remission, anemia, dyslipidemia, and hypertension.  She was admitted to [facility] hospital on April 08, 2016 in the setting of a non-ST segment elevation MI.  Cardiac catheterization revealed 85% focal stenosis in the mid LAD for which she underwent successful stenting with a Promus premier 2.2512 mm DES stent.  She had mild concomitant CAD with 20% irregularity in the mid RCA and 50% PDA stenoses.  An echo Doppler study showed an EF of 55-60% with mild LVH and Grade 1 diastolic dysfunction.  She has a history of low study edema for which she has taken furosemide every other day.  She has been on low-dose atorvastatin therapy but had developed myalgias with lovastatin.  Over the last several days, she has noticed some episodes of chest pressure.  Initially, this developed on December 30 and she felt most likely was due to indigestion.  However, with activity.  She has noticed recurrent chest pressure.  Today while at work she experienced more significant chest pain which is of similar character and quality as her prior rheumatology before stent placement.  Due to recurrent symptomatology.  She presented to  the emergency room.  Her ECG shows sinus rhythm with new ST-T changes in the inferior and ateral leads.  This  abnormality was not present on her most recent ECG in the office in October 2017.  Initial troponin point-of-care marker is mildly elevated at 0.15.  Chest x-ray has demonstrated the previously noted small 5 mm right lung base pulmonary nodule.  At present, she is tachycardic with a heart rate of 100.  Her blood pressure is stable.  She has a thick neck.  [place] potty scale is 3.  HEENT is otherwise negative.  There are no rales.  She does not have chest wall pain.  Rhythm is regular without ectopy.  There is a faint 1/6 systolic murmur.  She has central adiposity.  Pulses are 2+. Improvement in prior lower extremity edema.  Neurologic exam was nonfocal.  She will be admitted with unstable angina and be started on heparin therapy in addition to IV nitroglycerin.  It is my recommendation that she undergo definitive cardiac catheterization.  We have called the Cath Lab and she is scheduled for this study to be done tomorrow.  She continues to be on dual antiplatelet therapy with aspirin/brilinta.  I will titrate atorvastatin to 80 mg. The risks and benefits of a cardiac catheterization including, but not limited to, death, stroke, MI, kidney damage and bleeding were discussed with the patient who indicates understanding and agrees to proceed.   Troy Sine, MD, [facility] 10/15/2016 3:24 PM

## 2016-10-16 ENCOUNTER — Encounter (HOSPITAL_COMMUNITY)
Admission: EM | Disposition: A | Discharge status: Home / Self Care | Payer: Self-pay | Source: Home / Self Care | Attending: Cardiovascular Disease

## 2016-10-16 ENCOUNTER — Encounter (HOSPITAL_COMMUNITY): Payer: Self-pay | Admitting: Cardiology

## 2016-10-16 DIAGNOSIS — I25110 Atherosclerotic heart disease of native coronary artery with unstable angina pectoris: Secondary | ICD-10-CM | POA: Diagnosis present

## 2016-10-16 DIAGNOSIS — I110 Hypertensive heart disease with heart failure: Secondary | ICD-10-CM | POA: Diagnosis present

## 2016-10-16 DIAGNOSIS — C8510 Unspecified B-cell lymphoma, unspecified site: Secondary | ICD-10-CM | POA: Diagnosis present

## 2016-10-16 DIAGNOSIS — I214 Non-ST elevation (NSTEMI) myocardial infarction: Principal | ICD-10-CM

## 2016-10-16 DIAGNOSIS — R079 Chest pain, unspecified: Secondary | ICD-10-CM | POA: Diagnosis present

## 2016-10-16 DIAGNOSIS — Z881 Allergy status to other antibiotic agents status: Secondary | ICD-10-CM | POA: Diagnosis not present

## 2016-10-16 DIAGNOSIS — Z888 Allergy status to other drugs, medicaments and biological substances status: Secondary | ICD-10-CM | POA: Diagnosis not present

## 2016-10-16 DIAGNOSIS — Z955 Presence of coronary angioplasty implant and graft: Secondary | ICD-10-CM | POA: Diagnosis not present

## 2016-10-16 DIAGNOSIS — E119 Type 2 diabetes mellitus without complications: Secondary | ICD-10-CM | POA: Diagnosis present

## 2016-10-16 DIAGNOSIS — Z9221 Personal history of antineoplastic chemotherapy: Secondary | ICD-10-CM | POA: Diagnosis not present

## 2016-10-16 DIAGNOSIS — Z6841 Body Mass Index (BMI) 40.0 and over, adult: Secondary | ICD-10-CM | POA: Diagnosis not present

## 2016-10-16 DIAGNOSIS — Z7901 Long term (current) use of anticoagulants: Secondary | ICD-10-CM | POA: Diagnosis not present

## 2016-10-16 DIAGNOSIS — I2510 Atherosclerotic heart disease of native coronary artery without angina pectoris: Secondary | ICD-10-CM | POA: Diagnosis not present

## 2016-10-16 DIAGNOSIS — Z7982 Long term (current) use of aspirin: Secondary | ICD-10-CM | POA: Diagnosis not present

## 2016-10-16 DIAGNOSIS — E785 Hyperlipidemia, unspecified: Secondary | ICD-10-CM | POA: Diagnosis present

## 2016-10-16 DIAGNOSIS — I252 Old myocardial infarction: Secondary | ICD-10-CM | POA: Diagnosis not present

## 2016-10-16 DIAGNOSIS — Y831 Surgical operation with implant of artificial internal device as the cause of abnormal reaction of the patient, or of later complication, without mention of misadventure at the time of the procedure: Secondary | ICD-10-CM | POA: Diagnosis present

## 2016-10-16 DIAGNOSIS — I5032 Chronic diastolic (congestive) heart failure: Secondary | ICD-10-CM | POA: Diagnosis present

## 2016-10-16 DIAGNOSIS — Z79899 Other long term (current) drug therapy: Secondary | ICD-10-CM | POA: Diagnosis not present

## 2016-10-16 DIAGNOSIS — Z794 Long term (current) use of insulin: Secondary | ICD-10-CM | POA: Diagnosis not present

## 2016-10-16 DIAGNOSIS — Z87891 Personal history of nicotine dependence: Secondary | ICD-10-CM | POA: Diagnosis not present

## 2016-10-16 DIAGNOSIS — D649 Anemia, unspecified: Secondary | ICD-10-CM | POA: Diagnosis present

## 2016-10-16 DIAGNOSIS — T82855A Stenosis of coronary artery stent, initial encounter: Secondary | ICD-10-CM | POA: Diagnosis present

## 2016-10-16 HISTORY — PX: CARDIAC CATHETERIZATION: SHX172

## 2016-10-16 LAB — CBC
HCT: 35.1 % — ABNORMAL LOW (ref 36.0–46.0)
HEMOGLOBIN: 11.3 g/dL — AB (ref 12.0–15.0)
MCH: 23 pg — AB (ref 26.0–34.0)
MCHC: 32.2 g/dL (ref 30.0–36.0)
MCV: 71.5 fL — ABNORMAL LOW (ref 78.0–100.0)
PLATELETS: 174 10*3/uL (ref 150–400)
RBC: 4.91 MIL/uL (ref 3.87–5.11)
RDW: 14.3 % (ref 11.5–15.5)
WBC: 10.9 10*3/uL — ABNORMAL HIGH (ref 4.0–10.5)

## 2016-10-16 LAB — COMPREHENSIVE METABOLIC PANEL
ALBUMIN: 3.1 g/dL — AB (ref 3.5–5.0)
ALT: 18 U/L (ref 14–54)
AST: 16 U/L (ref 15–41)
Alkaline Phosphatase: 71 U/L (ref 38–126)
Anion gap: 7 (ref 5–15)
BUN: 8 mg/dL (ref 6–20)
CHLORIDE: 103 mmol/L (ref 101–111)
CO2: 25 mmol/L (ref 22–32)
Calcium: 8.4 mg/dL — ABNORMAL LOW (ref 8.9–10.3)
Creatinine, Ser: 0.61 mg/dL (ref 0.44–1.00)
GFR calc Af Amer: >60 mL/min (ref >60)
Glucose, Bld: 266 mg/dL — ABNORMAL HIGH (ref 65–99)
POTASSIUM: 3.2 mmol/L — AB (ref 3.5–5.1)
Sodium: 135 mmol/L (ref 135–145)
Total Bilirubin: 0.4 mg/dL (ref 0.3–1.2)
Total Protein: 5.6 g/dL — ABNORMAL LOW (ref 6.5–8.1)

## 2016-10-16 LAB — LIPID PANEL
CHOL/HDL RATIO: 3.9 ratio
Cholesterol: 105 mg/dL (ref 0–200)
HDL: 27 mg/dL — ABNORMAL LOW (ref >40)
LDL Cholesterol: 52 mg/dL (ref 0–99)
Triglycerides: 130 mg/dL (ref <150)
VLDL: 26 mg/dL (ref 0–40)

## 2016-10-16 LAB — GLUCOSE, CAPILLARY
GLUCOSE-CAPILLARY: 271 mg/dL — AB (ref 65–99)
Glucose-Capillary: 207 mg/dL — ABNORMAL HIGH (ref 65–99)
Glucose-Capillary: 146 mg/dL — ABNORMAL HIGH (ref 65–99)
Glucose-Capillary: 282 mg/dL — ABNORMAL HIGH (ref 65–99)

## 2016-10-16 LAB — HEPARIN LEVEL (UNFRACTIONATED)
HEPARIN UNFRACTIONATED: 0.32 [IU]/mL (ref 0.30–0.70)
Heparin Unfractionated: 0.29 IU/mL — ABNORMAL LOW (ref 0.30–0.70)

## 2016-10-16 LAB — TROPONIN I: TROPONIN I: 0.12 ng/mL — AB (ref <0.03)

## 2016-10-16 LAB — HCG, SERUM, QUALITATIVE: Preg, Serum: NEGATIVE

## 2016-10-16 LAB — PROTIME-INR
INR: 1.13
PROTHROMBIN TIME: 14.6 s (ref 11.4–15.2)

## 2016-10-16 LAB — POCT ACTIVATED CLOTTING TIME
Activated Clotting Time: 235 seconds
Activated Clotting Time: 505 seconds
Activated Clotting Time: 356 seconds

## 2016-10-16 LAB — PREGNANCY, URINE: Preg Test, Ur: NEGATIVE

## 2016-10-16 SURGERY — LEFT HEART CATH AND CORONARY ANGIOGRAPHY
Anesthesia: LOCAL

## 2016-10-16 MED ORDER — NITROGLYCERIN 1 MG/10 ML FOR IR/CATH LAB
INTRA_ARTERIAL | Status: DC | PRN
  Administered 2016-10-16: 200 ug via INTRACORONARY

## 2016-10-16 MED ORDER — ATORVASTATIN CALCIUM 80 MG PO TABS
80.0000 mg | ORAL_TABLET | Freq: Every day | ORAL | Status: DC
  Administered 2016-10-16: 80 mg via ORAL
  Filled 2016-10-16: qty 1

## 2016-10-16 MED ORDER — HEPARIN (PORCINE) IN NACL 2-0.9 UNIT/ML-% IJ SOLN
INTRAMUSCULAR | Status: AC
  Filled 2016-10-16: qty 1000

## 2016-10-16 MED ORDER — HEPARIN SODIUM (PORCINE) 1000 UNIT/ML IJ SOLN
INTRAMUSCULAR | Status: DC | PRN
  Administered 2016-10-16: 5000 [IU] via INTRAVENOUS
  Administered 2016-10-16: 6000 [IU] via INTRAVENOUS
  Administered 2016-10-16: 3000 [IU] via INTRAVENOUS

## 2016-10-16 MED ORDER — SODIUM CHLORIDE 0.9% FLUSH: 3.0000 mL | Freq: Two times a day (BID) | INTRAVENOUS | Status: DC

## 2016-10-16 MED ORDER — FENTANYL CITRATE (PF) 100 MCG/2ML IJ SOLN
INTRAMUSCULAR | Status: AC
  Filled 2016-10-16: qty 2

## 2016-10-16 MED ORDER — HEPARIN SODIUM (PORCINE) 1000 UNIT/ML IJ SOLN
INTRAMUSCULAR | Status: AC
  Filled 2016-10-16: qty 1

## 2016-10-16 MED ORDER — MIDAZOLAM HCL 2 MG/2ML IJ SOLN
INTRAMUSCULAR | Status: AC
  Filled 2016-10-16: qty 2

## 2016-10-16 MED ORDER — NITROGLYCERIN 1 MG/10 ML FOR IR/CATH LAB
INTRA_ARTERIAL | Status: AC
Start: 2016-10-16 — End: 2016-10-16
  Filled 2016-10-16: qty 10

## 2016-10-16 MED ORDER — VERAPAMIL HCL 2.5 MG/ML IV SOLN
INTRAVENOUS | Status: DC | PRN
  Administered 2016-10-16: 10 mL via INTRA_ARTERIAL

## 2016-10-16 MED ORDER — HYDRALAZINE HCL 20 MG/ML IJ SOLN: 5.0000 mg | INTRAMUSCULAR | Status: AC | PRN

## 2016-10-16 MED ORDER — FENTANYL CITRATE (PF) 100 MCG/2ML IJ SOLN
INTRAMUSCULAR | Status: DC | PRN
  Administered 2016-10-16 (×3): 25 ug via INTRAVENOUS

## 2016-10-16 MED ORDER — OXYCODONE-ACETAMINOPHEN 5-325 MG PO TABS
1.0000 | ORAL_TABLET | ORAL | Status: DC | PRN
  Administered 2016-10-16: 18:00:00 1 via ORAL
  Filled 2016-10-16: qty 1

## 2016-10-16 MED ORDER — SODIUM CHLORIDE 0.9% FLUSH: 3.0000 mL | INTRAVENOUS | Status: DC | PRN

## 2016-10-16 MED ORDER — LIDOCAINE HCL (PF) 1 % IJ SOLN
INTRAMUSCULAR | Status: AC
  Filled 2016-10-16: qty 30

## 2016-10-16 MED ORDER — VERAPAMIL HCL 2.5 MG/ML IV SOLN
INTRAVENOUS | Status: AC
  Filled 2016-10-16: qty 2

## 2016-10-16 MED ORDER — IOPAMIDOL (ISOVUE-370) INJECTION 76%
INTRAVENOUS | Status: AC
  Filled 2016-10-16: qty 100

## 2016-10-16 MED ORDER — SODIUM CHLORIDE 0.9 % IV SOLN: 250.0000 mL | INTRAVENOUS | Status: DC | PRN

## 2016-10-16 MED ORDER — SODIUM CHLORIDE 0.9 % WEIGHT BASED INFUSION
1.0000 mL/kg/h | INTRAVENOUS | Status: AC
  Administered 2016-10-16: 1 mL/kg/h via INTRAVENOUS

## 2016-10-16 MED ORDER — LABETALOL HCL 5 MG/ML IV SOLN: 10.0000 mg | INTRAVENOUS | Status: AC | PRN

## 2016-10-16 MED ORDER — HEPARIN (PORCINE) IN NACL 2-0.9 UNIT/ML-% IJ SOLN
INTRAMUSCULAR | Status: DC | PRN
  Administered 2016-10-16: 1000 mL

## 2016-10-16 MED ORDER — ANGIOPLASTY BOOK
Freq: Once | Status: AC
  Administered 2016-10-16: 20:00:00
  Filled 2016-10-16: qty 1

## 2016-10-16 MED ORDER — HEART ATTACK BOUNCING BOOK
Freq: Once | Status: AC
  Administered 2016-10-16: 20:00:00
  Filled 2016-10-16: qty 1

## 2016-10-16 MED ORDER — MIDAZOLAM HCL 2 MG/2ML IJ SOLN
INTRAMUSCULAR | Status: DC | PRN
  Administered 2016-10-16 (×3): 1 mg via INTRAVENOUS

## 2016-10-16 MED ORDER — LIDOCAINE HCL (PF) 1 % IJ SOLN
INTRAMUSCULAR | Status: DC | PRN
  Administered 2016-10-16: 2 mL

## 2016-10-16 MED ORDER — IOPAMIDOL (ISOVUE-370) INJECTION 76%
INTRAVENOUS | Status: DC | PRN
  Administered 2016-10-16: 190 mL via INTRA_ARTERIAL

## 2016-10-16 SURGICAL SUPPLY — 28 items
BALLN EUPHORA RX 2.5X12 (BALLOONS) ×2
BALLN ~~LOC~~ EMERGE MR 3.75X12 (BALLOONS) ×2
BALLN ~~LOC~~ EUPHORA RX 2.75X8 (BALLOONS) ×2
BALLN ~~LOC~~ EUPHORA RX 3.25X15 (BALLOONS) ×2
BALLOON EUPHORA RX 2.5X12 (BALLOONS) ×1 IMPLANT
BALLOON ~~LOC~~ EMERGE MR 3.75X12 (BALLOONS) ×1 IMPLANT
BALLOON ~~LOC~~ EUPHORA RX 2.75X8 (BALLOONS) ×1 IMPLANT
BALLOON ~~LOC~~ EUPHORA RX 3.25X15 (BALLOONS) ×1 IMPLANT
CATH EXPO 5F FL3.5 (CATHETERS) ×2 IMPLANT
CATH EXPO 5FR ANG PIGTAIL 145 (CATHETERS) ×2 IMPLANT
CATH EXPO 5FR FR4 (CATHETERS) ×2 IMPLANT
CATH OPTICROSS 40MHZ (CATHETERS) ×2 IMPLANT
CATH VISTA GUIDE 6FR XBLAD3.5 (CATHETERS) ×2 IMPLANT
DEVICE RAD COMP TR BAND LRG (VASCULAR PRODUCTS) ×2 IMPLANT
GLIDESHEATH SLEND SS 6F .021 (SHEATH) ×2 IMPLANT
GUIDEWIRE INQWIRE 1.5J.035X260 (WIRE) ×1 IMPLANT
HOVERMATT SINGLE USE (MISCELLANEOUS) ×2 IMPLANT
INQWIRE 1.5J .035X260CM (WIRE) ×2
KIT ENCORE 26 ADVANTAGE (KITS) ×2 IMPLANT
KIT HEART LEFT (KITS) ×2 IMPLANT
PACK CARDIAC CATHETERIZATION (CUSTOM PROCEDURE TRAY) ×2 IMPLANT
SLED PULL BACK IVUS (MISCELLANEOUS) ×2 IMPLANT
STENT RESOLUTE ONYX 3.0X18 (Permanent Stent) ×2 IMPLANT
STENT RESOLUTE ONYX 3.5X15 (Permanent Stent) ×2 IMPLANT
SYR MEDRAD MARK V 150ML (SYRINGE) ×2 IMPLANT
TRANSDUCER W/STOPCOCK (MISCELLANEOUS) ×2 IMPLANT
TUBING CIL FLEX 10 FLL-RA (TUBING) ×2 IMPLANT
WIRE ASAHI PROWATER 180CM (WIRE) ×2 IMPLANT

## 2016-10-16 NOTE — Interval H&P Note (Signed)
History and Physical Interval Note:  10/16/2016 1:20 PM  Desiree [name]  has presented today for surgery, with the diagnosis of cp  The various methods of treatment have been discussed with the patient and family. After consideration of risks, benefits and other options for treatment, the patient has consented to  Procedure(s): Left Heart Cath and Coronary Angiography (N/A) as a surgical intervention .  The patient's history has been reviewed, patient examined, no change in status, stable for surgery.  I have reviewed the patient's chart and labs.  Questions were answered to the patient's satisfaction.   Cath Lab Visit (complete for each Cath Lab visit)  Clinical Evaluation Leading to the Procedure:   ACS: Yes.    Non-ACS:    Anginal Classification: CCS IV  Anti-ischemic medical therapy: Minimal Therapy (1 class of medications)  Non-Invasive Test Results: No non-invasive testing performed  Prior CABG: No previous CABG        Desiree [name] [facility] 10/16/2016 1:20 PM

## 2016-10-16 NOTE — Progress Notes (Signed)
[facility] for heparin Indication: chest pain/ACS  Allergies  Allergen Reactions  . Erythromycin Anaphylaxis    Swelling and difficulty breathing.  . Lovastatin     Leg cramps  . Tolnaftate Hives    Reaction caused by pills and the cream.    Patient Measurements: Height: 5\' 9"  (175.3 cm) Weight: 265 lb 12.8 oz (120.6 kg) IBW/kg (Calculated) : 66.2 Heparin Dosing Weight: 95kg  Assessment: 42 yof presented to the ED with SOB and CP. Found to have elevated troponins on IV heparin. Ppans noted for cath today. -heparin level is at goal (0.32).  Goal of Therapy:  Heparin level 0.3-0.7 units/ml Monitor platelets by anticoagulation protocol: Yes   Plan:  -No heparin changes needed -Will follow post cath  Hildred Laser, Pharm D 10/16/2016 11:16 AM

## 2016-10-16 NOTE — Progress Notes (Signed)
Patient Name: Desiree [name] Date of Encounter: 10/16/2016  Primary Cardiologist:   Hospital Problem List     Active Problems:   NSTEMI (non-ST elevated myocardial infarction) ([place])     Subjective   Feels well, no chest pain or SOB.   Inpatient Medications    Scheduled Meds: . aspirin  81 mg Oral Daily  . atorvastatin  10 mg Oral q1800  . insulin aspart  0-15 Units Subcutaneous TID WC  . insulin aspart  0-5 Units Subcutaneous QHS  . losartan  100 mg Oral Daily  . metoprolol tartrate  50 mg Oral BID  . multivitamin-prenatal  2 tablet Oral Q1200  . omega-3 acid ethyl esters  1 g Oral BID  . pantoprazole  40 mg Oral Daily  . sodium chloride flush  3 mL Intravenous Q12H  . ticagrelor  90 mg Oral BID   Continuous Infusions: . sodium chloride 1 mL/kg/hr (10/16/16 0700)  . heparin 1,600 Units/hr (10/16/16 0318)  . nitroGLYCERIN 5 mcg/min (10/16/16 0015)   PRN Meds: sodium chloride, acetaminophen, diphenhydrAMINE **OR** diphenhydrAMINE, nitroGLYCERIN, ondansetron (ZOFRAN) IV, sodium chloride flush   Vital Signs    Vitals:   10/16/16 0400 10/16/16 0500 10/16/16 0554 10/16/16 0850  BP: 114/79 94/68 107/69 120/87  Pulse:   84 91  Resp:      Temp:   97.4 F (36.3 C)   TempSrc:   Oral   SpO2:   97%   Weight:   265 lb 12.8 oz (120.6 kg)   Height:        Intake/Output Summary (Last 24 hours) at 10/16/16 1025 Last data filed at 10/16/16 0831  Gross per 24 hour  Intake                0 ml  Output                0 ml  Net                0 ml   Filed Weights   10/15/16 1301 10/15/16 1841 10/16/16 0554  Weight: 270 lb 11.6 oz (122.8 kg) 265 lb 3.2 oz (120.3 kg) 265 lb 12.8 oz (120.6 kg)    Physical Exam   General: Pleasant, Morbidly obese female in NAD Psych: Normal affect. Neuro: Alert and oriented X 3. Moves all extremities spontaneously. HEENT: Normal           Neck: Supple without bruits or JVD. Lungs:  Resp regular and unlabored, CTA. Tender to  palpation underneath her left breast. Heart: RRR no s3, s4, or murmurs. Abdomen: Soft, non-tender, non-distended, BS + x 4.  Extremities: No clubbing, cyanosis. Trace BL LE edema. DP/PT/Radials 2+ and equal bilaterally.  Labs    CBC  Recent Labs  10/15/16 1102 10/16/16 0234  WBC 10.7* 10.9*  HGB 12.6 11.3*  HCT 38.7 35.1*  MCV 71.8* 71.5*  PLT 185 [id number]   Basic Metabolic Panel  Recent Labs  10/15/16 1102 10/16/16 0234  NA 138 135  K 3.9 3.2*  CL 101 103  CO2 27 25  GLUCOSE 282* 266*  BUN 9 8  CREATININE 0.69 0.61  CALCIUM 9.2 8.4*   Liver Function Tests  Recent Labs  10/15/16 1102 10/16/16 0234  AST 17 16  ALT 19 18  ALKPHOS 91 71  BILITOT 0.5 0.4  PROT 6.5 5.6*  ALBUMIN 3.7 3.1*   Cardiac Enzymes  Recent Labs  10/16/16 0810  TROPONINI 0.12*   Fasting Lipid Panel  Recent Labs  10/16/16 0234  CHOL 105  HDL 27*  LDLCALC 52  TRIG 130  CHOLHDL 3.9     Telemetry    NSR - Personally Reviewed  ECG    NSR with ST/T wave changes in the inferior leads.  - Personally Reviewed  Radiology    Dg Chest 2 View  Result Date: 10/15/2016 CLINICAL DATA:  Chest pain. EXAM: CHEST  2 VIEW COMPARISON:  CT 04/08/2016.  Chest x-ray 04/08/2016. FINDINGS: Mediastinum Hilar structures normal. Lungs are clear. Heart size normal. Previously identified tiny 5 mm nodule again noted over the right lung base . No pleural effusion or pneumothorax. No focal bony abnormality. IMPRESSION: 1. Previously identified tiny 5 mm pulmonary nodule right lung base again noted. 2. Low lung volumes. Electronically Signed   By: Marcello Moores  Register   On: 10/15/2016 11:52       Patient Profile     Desiree [name] is a 51 y.o. 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   Assessment &  Plan    1. Chest pressure with elevated troponin - Has both typical and atypical features. More concerning for anginal pain. Similar to prior cardiac pain when she had a stent placement however with less intensity. Point-of-care troponin 0.15. EKG with new ST/T-wave changes in inferior lateral lead. chest pain-free. IV heparin started by ED provider. He also given GI cocktail for possible GI etiology.  - Continue aspirin, brillinta, metoprolol (increase to 50mg  BID to tachycardia), statin  and losartan. Continue Iv heparin, Nitro  Cath today.   The patient understands that risks include but are not limited to stroke (1 in 1000), death (1 in 89), kidney failure [usually temporary] (1 in 500), bleeding (1 in 200), allergic reaction [possibly serious] (1 in 200), and agrees to proceed.   2. Chronic diastolic CHF - She takes Lasix every other day for intermittent lower extremity edema. Noted trace edema today as well. Chest x-ray without evidence of CHF. She will likely need a small dose of daily Lasix.  3. Hypertension - Stable and well controlled on current regimen  4. HLD - 04/09/2016: Cholesterol 105; HDL 27; LDL Cholesterol 52; Triglycerides 130; VLDL 26. Lipitor increased this admission to Atorvastatin 10mg  daily, was previously on atorvastatin 3 times a week.   5. DM - Will place her on SSI.   6. Splenic marginal zone b-cell lymphoma  - Followed by Dr. Alvy Bimler. In remission.     Signed, Arbutus Leas, NP  10/16/2016, 10:25 AM    Patient seen and examined. Agree with assessment and plan. No recurrent chest pain. ECG NSR at 86 without significant ST changes.  Troponin 0.12 Plan cath today.   Troy Sine, MD, [facility] 10/16/2016 1:21 PM

## 2016-10-16 NOTE — Progress Notes (Signed)
1600 patient complained of headache in back of her head. Similar to head ache earlier today except it was on her right side of her head. She was told it was probably from nitroglycerin. Vision in right eye normal. Left eye noted white dots when looking all the way to the left. No pronator drift noted, Hand grasps equal and strong bilateral. Denies any decreased sensation.  Facial symmetry noted with no facial droop. Given patient 650 mg of tylenol for headache and reported findings to patients nurse, Chales Salmon, RN.

## 2016-10-16 NOTE — Progress Notes (Signed)
Inpatient Diabetes Program Recommendations  AACE/ADA: New Consensus Statement on Inpatient Glycemic Control (2015)  Target Ranges:  Prepandial:   less than 140 mg/dL      Peak postprandial:   less than 180 mg/dL (1-2 hours)      Critically ill patients:  140 - 180 mg/dL   Lab Results  Component Value Date   GLUCAP 207 (H) 10/16/2016   HGBA1C 12.7 (H) 04/08/2016    Review of Glycemic Control Results for BAKER, Desiree [name] (MRN [id number]) as of 10/16/2016 11:38  Ref. Range 10/15/2016 18:58 10/15/2016 21:39 10/16/2016 07:36 10/16/2016 11:15  Glucose-Capillary Latest Ref Range: 65 - 99 mg/dL 201 (H) 351 (H) 271 (H) 207 (H)   Diabetes history: DM2 Outpatient Diabetes medications: Metformin 1 gm bid + Novolog correction tid with meals Current orders for Inpatient glycemic control: Novolog correction 0-15 units tid + 0-5 units hs  Inpatient Diabetes Program Recommendations:  Last A1c 12.7 on 04/08/16. Noted for heart cath today. Will follow. Please consider: -A1c to determine prehospital glycemic control -While orals held, Lantus 12 units (0.1 unit/kg)  Thank you, Bethena Roys E. Forrestine Lecrone, RN, MSN, CDE Inpatient Glycemic Control Team Team Pager [phone] (8am-5pm) 10/16/2016 12:16 PM

## 2016-10-16 NOTE — Progress Notes (Signed)
TR BAND REMOVAL  LOCATION:    right radial  DEFLATED PER PROTOCOL:    Yes.    TIME BAND OFF / DRESSING APPLIED:    20:00   SITE UPON ARRIVAL:    Level 0  SITE AFTER BAND REMOVAL:    Level 0  CIRCULATION SENSATION AND MOVEMENT:    Within Normal Limits   Yes.    COMMENTS:   Post TR band instructions given. Pt tolerated well. 

## 2016-10-16 NOTE — Progress Notes (Signed)
ANTICOAGULATION CONSULT NOTE - Follow Up Consult  Pharmacy Consult for heparin Indication: chest pain/ACS  Labs:  Recent Labs  10/15/16 1102 10/15/16 1920 10/16/16 0234  HGB 12.6  --  11.3*  HCT 38.7  --  35.1*  PLT 185  --  174  HEPARINUNFRC  --  0.17* 0.29*  CREATININE 0.69  --   --     Assessment: 51yo female remains subtherapeutic on heparin after rate increase though very close to goal.  Goal of Therapy:  Heparin level 0.3-0.7 units/ml   Plan:  Will increase heparin gtt slightly to 1600 units/hr and check level in 6hr.  Wynona Neat, PharmD, BCPS  10/16/2016,3:16 AM

## 2016-10-17 ENCOUNTER — Telehealth: Payer: Self-pay | Admitting: Cardiovascular Disease

## 2016-10-17 DIAGNOSIS — Z955 Presence of coronary angioplasty implant and graft: Secondary | ICD-10-CM

## 2016-10-17 DIAGNOSIS — Z794 Long term (current) use of insulin: Secondary | ICD-10-CM

## 2016-10-17 DIAGNOSIS — E1159 Type 2 diabetes mellitus with other circulatory complications: Secondary | ICD-10-CM

## 2016-10-17 LAB — BASIC METABOLIC PANEL
ANION GAP: 7 (ref 5–15)
CO2: 24 mmol/L (ref 22–32)
Calcium: 8 mg/dL — ABNORMAL LOW (ref 8.9–10.3)
Chloride: 109 mmol/L (ref 101–111)
Creatinine, Ser: 0.59 mg/dL (ref 0.44–1.00)
GFR calc Af Amer: >60 mL/min (ref >60)
Glucose, Bld: 232 mg/dL — ABNORMAL HIGH (ref 65–99)
POTASSIUM: 3.5 mmol/L (ref 3.5–5.1)
SODIUM: 140 mmol/L (ref 135–145)

## 2016-10-17 LAB — CBC
HEMATOCRIT: 34.2 % — AB (ref 36.0–46.0)
HEMOGLOBIN: 10.8 g/dL — AB (ref 12.0–15.0)
MCH: 23 pg — ABNORMAL LOW (ref 26.0–34.0)
MCHC: 31.6 g/dL (ref 30.0–36.0)
MCV: 72.8 fL — ABNORMAL LOW (ref 78.0–100.0)
Platelets: 167 10*3/uL (ref 150–400)
RBC: 4.7 MIL/uL (ref 3.87–5.11)
RDW: 14.3 % (ref 11.5–15.5)
WBC: 8.5 10*3/uL (ref 4.0–10.5)

## 2016-10-17 LAB — GLUCOSE, CAPILLARY: Glucose-Capillary: 200 mg/dL — ABNORMAL HIGH (ref 65–99)

## 2016-10-17 MED ORDER — ATORVASTATIN CALCIUM 80 MG PO TABS: 80.0000 mg | ORAL_TABLET | Freq: Every day | ORAL | 12 refills | Status: DC

## 2016-10-17 MED ORDER — PANTOPRAZOLE SODIUM 40 MG PO TBEC: 40.0000 mg | DELAYED_RELEASE_TABLET | Freq: Every day | ORAL | 12 refills | Status: DC

## 2016-10-17 MED ORDER — FUROSEMIDE 20 MG PO TABS: 20.0000 mg | ORAL_TABLET | Freq: Every day | ORAL | 12 refills | Status: DC

## 2016-10-17 MED ORDER — METOPROLOL TARTRATE 50 MG PO TABS: 50.0000 mg | ORAL_TABLET | Freq: Two times a day (BID) | ORAL | 12 refills | Status: DC

## 2016-10-17 NOTE — Progress Notes (Signed)
Patient Name: Desiree [name] Date of Encounter: 10/17/2016  Primary Cardiologist: Dr. Clearnce Hasten Problem List     Active Problems:   NSTEMI (non-ST elevated myocardial infarction) [facility])     Subjective   Feels well, denies chest pain and SOB.   Inpatient Medications    Scheduled Meds: . aspirin  81 mg Oral Daily  . atorvastatin  80 mg Oral q1800  . insulin aspart  0-15 Units Subcutaneous TID WC  . insulin aspart  0-5 Units Subcutaneous QHS  . losartan  100 mg Oral Daily  . metoprolol tartrate  50 mg Oral BID  . multivitamin-prenatal  2 tablet Oral Q1200  . omega-3 acid ethyl esters  1 g Oral BID  . pantoprazole  40 mg Oral Daily  . sodium chloride flush  3 mL Intravenous Q12H  . ticagrelor  90 mg Oral BID   Continuous Infusions:  PRN Meds: sodium chloride, sodium chloride, acetaminophen, diphenhydrAMINE **OR** [DISCONTINUED] diphenhydrAMINE, nitroGLYCERIN, ondansetron (ZOFRAN) IV, oxyCODONE-acetaminophen, sodium chloride flush   Vital Signs    Vitals:   10/16/16 1912 10/16/16 2000 10/16/16 2014 10/17/16 0425  BP: (!) 97/59 (!) 90/51 (!) 98/48 108/74  Pulse: 95 96  91  Resp: (!) 24 20 16  (!) 27  Temp: 97.7 F (36.5 C)   97.5 F (36.4 C)  TempSrc: Oral   Axillary  SpO2: 96% 93% 96% 97%  Weight:    273 lb 9.5 oz (124.1 kg)  Height:        Intake/Output Summary (Last 24 hours) at 10/17/16 0757 Last data filed at 10/17/16 0200  Gross per 24 hour  Intake          2276.12 ml  Output                0 ml  Net          2276.12 ml   Filed Weights   10/15/16 1841 10/16/16 0554 10/17/16 0425  Weight: 265 lb 3.2 oz (120.3 kg) 265 lb 12.8 oz (120.6 kg) 273 lb 9.5 oz (124.1 kg)    Physical Exam   GEN: Well nourished, well developed, in no acute distress.  HEENT: Grossly normal.  Neck: Supple, no JVD, carotid bruits, or masses. Cardiac: RRR, no murmurs, rubs, or gallops. No clubbing, cyanosis, edema.  Radials/DP/PT 2+ and equal bilaterally. Right radial  site stable.  Respiratory:  Respirations regular and unlabored, clear to auscultation bilaterally. GI: Soft, nontender, nondistended, BS + x 4. MS: no deformity or atrophy. Skin: warm and dry, no rash. Neuro:  Strength and sensation are intact. Psych: AAOx3.  Normal affect.  Labs    CBC  Recent Labs  10/16/16 0234 10/17/16 0531  WBC 10.9* 8.5  HGB 11.3* 10.8*  HCT 35.1* 34.2*  MCV 71.5* 72.8*  PLT 174 A999333   Basic Metabolic Panel  Recent Labs  10/16/16 0234 10/17/16 0531  NA 135 140  K 3.2* 3.5  CL 103 109  CO2 25 24  GLUCOSE 266* 232*  BUN 8 <5*  CREATININE 0.61 0.59  CALCIUM 8.4* 8.0*   Liver Function Tests  Recent Labs  10/15/16 1102 10/16/16 0234  AST 17 16  ALT 19 18  ALKPHOS 91 71  BILITOT 0.5 0.4  PROT 6.5 5.6*  ALBUMIN 3.7 3.1*  Cardiac Enzymes  Recent Labs  10/16/16 0810  TROPONINI 0.12*   Fasting Lipid Panel  Recent Labs  10/16/16 0234  CHOL 105  HDL 27*  LDLCALC 52  TRIG 130  CHOLHDL 3.9     Telemetry    NSR - Personally Reviewed  ECG    NSR, no ST/T wave abnormalities - Personally Reviewed  Radiology    Dg Chest 2 View  Result Date: 10/15/2016 CLINICAL DATA:  Chest pain. EXAM: CHEST  2 VIEW COMPARISON:  CT 04/08/2016.  Chest x-ray 04/08/2016. FINDINGS: Mediastinum Hilar structures normal. Lungs are clear. Heart size normal. Previously identified tiny 5 mm nodule again noted over the right lung base . No pleural effusion or pneumothorax. No focal bony abnormality. IMPRESSION: 1. Previously identified tiny 5 mm pulmonary nodule right lung base again noted. 2. Low lung volumes. Electronically Signed   By: Marcello Moores  Register   On: 10/15/2016 11:52    Cardiac Studies   Coronary Stent Intervention  Intravascular Ultrasound/IVUS  Left Heart Cath and Coronary Angiography    The left ventricular systolic function is normal.  LV end diastolic pressure is normal.  The left ventricular ejection fraction is 55-65% by visual  estimate.  RPDA lesion, 30 %stenosed.  Mid LAD lesion, 60 %stenosed.  A STENT RESOLUTE ONYX 3.0X18 drug eluting stent was successfully placed, and does not overlap previously placed stent.  Post intervention, there is a 0% residual stenosis.  Dist LAD lesion, 99 %stenosed.  Post intervention with POBA, there is a 0% residual stenosis.  Prox Cx to Mid Cx lesion, 90 %stenosed.  A STENT RESOLUTE ONYX 3.5X15 drug eluting stent was successfully placed.  Post intervention, there is a 0% residual stenosis.   1. Severe 2 vessel obstructive CAD    - 60% mid LAD stenosis- new since June 2017. Severe area stenosis by IVUS    - 99% in stent restenosis in distal LAD    - 90% mid LCx- new since June 2017 2. Normal LV function 3. Normal LVEDP 4. Successful POBA of the distal LAD in stent restenosis 5. Successful stenting of the mid LAD with DES 6. Successful stenting of the mid LCX with DES  Plan: the patient has progressive coronary disease. She needs  aggressive risk factor modification. Needs high dose statin therapy. DAPT for at least one year. Lifestyle modification.    Patient Profile     Ms. 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   Assessment & Plan    1. NSTEMI: s/p POBA of distal LAD in stent restenosis, successful stenting of mid LAD with DES and stenting of the mid LCx.   She had progression of her CAD from 6 months prior when her LAD was stented. Also 90% mid LCx stenosis was new since June 2017. Needs aggressive risk factor modification.   Will continue DAPT for one year, atorvastatin increased to 80mg  this admission.   2. Chronic diastolic CHF: LVEDP 15 yesterday. Would start 20mg  Lasix daily.   3. Hypertension: Stable and  well controlled on current regimen - Losartan 100mg , metoprolol 50mg  BID.  4. HLD - 04/09/2016: Cholesterol 105; HDL 27; LDL Cholesterol 52; Triglycerides 130; VLDL 26.    5. DM: Resume metformin tomorrow. Talked to her about making an appointment with her PCP as her last A1c was 12.7 six months ago. She was agreeable.   6. Splenic marginal zone b-cell lymphoma  - Followed by Dr. Alvy Bimler. In remission.   Likely can dc  today. Will await final recommendations from Dr. Claiborne Billings.   Signed, Arbutus Leas, NP  10/17/2016, 7:57 AM   Patient seen and examined. Agree with assessment and plan. Feels much better; no recurrent angina; s/p PCI to mid and distal LAD and LCX.  LDL 52 with very low HDL at 27. DC today and f/u OV.   Troy Sine, MD, [facility] 10/17/2016 10:29 AM

## 2016-10-17 NOTE — Care Management Note (Signed)
Case Management Note  Patient Details  Name: Desiree [name] MRN: [id number] Date of Birth: [birth year]  Subjective/Objective:  S/p coronary stent intervention, pta indep, she is already on brilinta, for dc today, no needs.                  Action/Plan:   Expected Discharge Date:                  Expected Discharge Plan:  Home/Self Care  In-House Referral:     Discharge planning Services  CM Consult  Post Acute Care Choice:    Choice offered to:     DME Arranged:    DME Agency:     HH Arranged:    HH Agency:     Status of Service:  Completed, signed off  If discussed at [employer] of Stay Meetings, dates discussed:    Additional Comments:  Zenon Mayo, RN 10/17/2016, 12:22 PM

## 2016-10-17 NOTE — Telephone Encounter (Signed)
Spoke to patient. Noted that she is requesting work note for her husband to stay home w her post-hospital discharge until Monday. Explained that I'm not sure we can approve a note for this, but I'm happy to inquire. Explained that Dr. Claiborne Billings would probably not write the note and would request this from the hospital discharge provider. She does note husband was driving to [place] from [place] to stay with her during her hospital admission. She's requesting dates from 1/3 (wednesday) up to 1/8 (next Monday).   Please review and provide if able. Let me know, I will contact pt next week.

## 2016-10-17 NOTE — Progress Notes (Signed)
CARDIAC REHAB PHASE I   PRE:  Rate/Rhythm: 97 SR    BP: sitting 102/64    SaO2:   MODE:  Ambulation: 1000 ft   POST:  Rate/Rhythm: 105 ST    BP: sitting 111/67     SaO2:   Tolerated well. Does have chest "soreness", more when she is sitting than walking. Long discussion of how to make change with lifestyle as her mother just passed of DM/vascular disease. Pt very receptive. Will refer to [place] as she works here in [employer]. Understands importance of Brilinta and has some at home.  [place], ACSM 10/17/2016 9:18 AM

## 2016-10-17 NOTE — Discharge Summary (Signed)
Discharge Summary    Patient ID: Desiree [name],  MRN: [id number], DOB/AGE: [birth year] 50 y.o.  Admit date: 10/15/2016 Discharge date: 10/17/2016  Primary Care Provider: Rogers Blocker Primary Cardiologist: Dr. Martinique   Discharge Diagnoses    Active Problems:   NSTEMI (non-ST elevated myocardial infarction) [facility])   Status post coronary artery stent placement   Allergies Allergies  Allergen Reactions  . Erythromycin Anaphylaxis    Swelling and difficulty breathing.  . Lovastatin     Leg cramps  . Tolnaftate Hives    Reaction caused by pills and the cream.    Diagnostic Studies/Procedures    Coronary Stent Intervention  Intravascular Ultrasound/IVUS  Left Heart Cath and Coronary Angiography    The left ventricular systolic function is normal.  LV end diastolic pressure is normal.  The left ventricular ejection fraction is 55-65% by visual estimate.  RPDA lesion, 30 %stenosed.  Mid LAD lesion, 60 %stenosed.  A STENT RESOLUTE ONYX 3.0X18 drug eluting stent was successfully placed, and does not overlap previously placed stent.  Post intervention, there is a 0% residual stenosis.  Dist LAD lesion, 99 %stenosed.  Post intervention with POBA, there is a 0% residual stenosis.  Prox Cx to Mid Cx lesion, 90 %stenosed.  A STENT RESOLUTE ONYX 3.5X15 drug eluting stent was successfully placed.  Post intervention, there is a 0% residual stenosis.   1. Severe 2 vessel obstructive CAD    - 60% mid LAD stenosis- new since June 2017. Severe area stenosis by IVUS    - 99% in stent restenosis in distal LAD    - 90% mid LCx- new since June 2017 2. Normal LV function 3. Normal LVEDP 4. Successful POBA of the distal LAD in stent restenosis 5. Successful stenting of the mid LAD with DES 6. Successful stenting of the mid LCX with DES  Plan: the patient has progressive coronary disease. She needs  aggressive risk factor modification. Needs high dose statin therapy. DAPT  for at least one year. Lifestyle modification.   _____________   History of Present Illness     Desiree [name] is a 51 y.o. 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   Presented to [facility] ER for evaluation of chest pain. Patient states that she felt indigestion since Saturday 12/30. She felt burping sensation after eating anything. No vomiting. She takes Zantac daily and has also tried over-the-counter Tums without any improvement. Yesterday while at work patient had a chest pain while walking. She described the pain as a pressure sensation underneath her left breast radiating underneath her axilla. Associated with shortness of breath. She sat down and symptoms resolved within 30 minutes. She again had a reoccurrence of chest pressure this morning while going up and downstairs at home. This time her symptoms started at least 40 minutes with resolution at rest.  She again had a reoccurrence of pain while taking a shower leading to ER presentation for further evaluation. She has not tried any nitroglycerin. Compliant with her medication. This episode is similar to her prior cardiac pain when she had a stent placement however with less intensity. Patient denies any dizziness, PND, syncope, melena, blood in her stool or urine. No fever or chills. Admits to having intermittent lower extremity edema which improved his when necessary Lasix.  She was admitted for further evaluation.    Hospital Course   1. NSTEMI: s/p POBA of distal LAD in stent restenosis, successful stenting of mid LAD with DES and stenting of the mid LCx. Troponin 0.12>>0.15.   She had progression of her CAD from 6 months prior when her LAD was stented. Also 90% mid LCx stenosis was new since June 2017. Needs  aggressive risk factor modification.   Will continue DAPT for one year, atorvastatin increased to 80mg  this admission.   2. Chronic diastolic CHF: LVEDP 15 yesterday. Start 20mg  Lasix daily.   3. Hypertension: Stable and well controlled on current regimen - Losartan 100mg , metoprolol 50mg  BID.  4. HLD - 04/09/2016: Cholesterol 105; HDL 27; LDL Cholesterol 52; Triglycerides 130; VLDL 26.    5. DM: Resume metformin and insulin.Talked to her about making an appointment with her PCP as her last A1c was 12.7 six months ago. She was agreeable.   6. Splenic marginal zone b-cell lymphoma  - Followed by Dr. Alvy Bimler. In remission.   _____________  Discharge Vitals Blood pressure 102/64, pulse 97, temperature 97.5 F (36.4 C), temperature source Oral, resp. rate 19, height 5\' 9"  (1.753 m), weight 273 lb 9.5 oz (124.1 kg), SpO2 97 %.  Filed Weights   10/15/16 1841 10/16/16 0554 10/17/16 0425  Weight: 265 lb 3.2 oz (120.3 kg) 265 lb 12.8 oz (120.6 kg) 273 lb 9.5 oz (124.1 kg)    Labs & Radiologic Studies     CBC  Recent Labs  10/16/16 0234 10/17/16 0531  WBC 10.9* 8.5  HGB 11.3* 10.8*  HCT 35.1* 34.2*  MCV 71.5* 72.8*  PLT 174 [id number]   Basic Metabolic Panel  Recent Labs  10/16/16 0234 10/17/16 0531  NA 135 140  K 3.2* 3.5  CL 103 109  CO2 25 24  GLUCOSE 266* 232*  BUN 8 <5*  CREATININE 0.61 0.59  CALCIUM 8.4* 8.0*   Liver Function Tests  Recent Labs  10/15/16 1102 10/16/16 0234  AST 17 16  ALT 19 18  ALKPHOS 91 71  BILITOT 0.5 0.4  PROT 6.5 5.6*  ALBUMIN 3.7 3.1*   Cardiac Enzymes  Recent Labs  10/16/16 0810  TROPONINI 0.12*   Fasting Lipid Panel  Recent Labs  10/16/16 0234  CHOL 105  HDL 27*  LDLCALC 52  TRIG 130  CHOLHDL 3.9    Dg Chest 2 View  Result Date: 10/15/2016 CLINICAL DATA:  Chest pain. EXAM: CHEST  2 VIEW COMPARISON:  CT 04/08/2016.  Chest x-ray 04/08/2016. FINDINGS: Mediastinum Hilar structures normal. Lungs are clear. Heart  size normal. Previously identified tiny 5 mm nodule again noted over the right lung base . No pleural effusion or pneumothorax. No focal bony abnormality. IMPRESSION: 1. Previously identified tiny 5 mm pulmonary nodule right lung base again noted. 2. Low lung volumes. Electronically Signed   By: Marcello Moores  Register   On: 10/15/2016 11:52    Disposition   Pt is being discharged home today in good condition.  Follow-up Plans & Appointments    Follow-up Information    Shelva Majestic, MD Follow up on 11/06/2016.   Specialty:  Cardiology Why:  at 8:15 am for hospital follow up Contact information: [address] [place] [address] [phone]          Discharge Instructions    AMB Referral to Cardiac Rehabilitation - Phase II    Complete by:  As directed    Diagnosis:  NSTEMI   Amb  Referral to Cardiac Rehabilitation    Complete by:  As directed    Diagnosis:   Coronary Stents PTCA NSTEMI     Diet - low sodium heart healthy    Complete by:  As directed    Discharge instructions    Complete by:  As directed    Do not start taking Metformin until tomorrow.   Increase activity slowly    Complete by:  As directed       Discharge Medications   Current Discharge Medication List    START taking these medications   Details  pantoprazole (PROTONIX) 40 MG tablet Take 1 tablet (40 mg total) by mouth daily. Qty: 30 tablet, Refills: 12      CONTINUE these medications which have CHANGED   Details  atorvastatin (LIPITOR) 80 MG tablet Take 1 tablet (80 mg total) by mouth daily at 6 PM. Qty: 30 tablet, Refills: 12    furosemide (LASIX) 20 MG tablet Take 1 tablet (20 mg total) by mouth daily. Qty: 30 tablet, Refills: 12    metoprolol tartrate (LOPRESSOR) 50 MG tablet Take 1 tablet (50 mg total) by mouth 2 (two) times daily. Qty: 60 tablet, Refills: 12      CONTINUE these medications which have NOT CHANGED   Details  acetaminophen (TYLENOL) 500 MG tablet Take 1  tablet (500 mg total) by mouth every 8 (eight) hours as needed (pain). Qty: 30 tablet, Refills: 0    aspirin 81 MG chewable tablet Chew 1 tablet (81 mg total) by mouth daily. Qty: 30 tablet, Refills: 0    fish oil-omega-3 fatty acids 1000 MG capsule Take 1 g by mouth 2 (two) times daily.     insulin aspart (NOVOLOG FLEXPEN) 100 UNIT/ML FlexPen Before each meal 3 times a day, 140-199 - 2 units, 200-250 - 4 units, 251-299 - 6 units,  300-349 - 8 units,  350 or above 10 units. Insulin PEN if approved, provide syringes and needles if needed. Qty: 15 mL, Refills: 1    losartan (COZAAR) 100 MG tablet Take 100 mg by mouth daily.    metFORMIN (GLUCOPHAGE) 1000 MG tablet Take 1,000 mg by mouth 2 (two) times daily with a meal.      nitroGLYCERIN (NITROSTAT) 0.4 MG SL tablet Place 1 tablet (0.4 mg total) under the tongue every 5 (five) minutes as needed for chest pain. Qty: 30 tablet, Refills: 0    Potassium Chloride ER 20 MEQ TBCR Take 20 mEq by mouth daily.     Prenatal Vit-Fe Fumarate-FA (MULTIVITAMIN-PRENATAL) 27-0.8 MG TABS tablet Take 2 tablets by mouth daily at 12 noon.    ticagrelor (BRILINTA) 90 MG TABS tablet Take 1 tablet (90 mg total) by mouth 2 (two) times daily. Qty: 180 tablet, Refills: 3    valACYclovir (VALTREX) 500 MG tablet Take 500 mg by mouth 2 (two) times daily as needed. Takes for outbreaks only.   Associated Diagnoses: Splenic marginal zone b-cell lymphoma (HCC)      STOP taking these medications     ranitidine (ZANTAC) 150 MG tablet          Aspirin prescribed at discharge?  Yes High Intensity Statin Prescribed? (Lipitor 40-80mg  or Crestor 20-40mg ): Yes Beta Blocker Prescribed? Yes For EF 40% or less, Was ACEI/ARB Prescribed? No: EF normal  ADP Receptor Inhibitor Prescribed? (i.e. Plavix etc.-Includes Medically Managed Patients): Yes For EF <40%, Aldosterone Inhibitor Prescribed? No: EF normal  Was EF assessed during THIS hospitalization? No: Recent Echo Was  Cardiac Rehab  II ordered? (Included Medically managed Patients): Yes   Outstanding Labs/Studies     Duration of Discharge Encounter   Greater than 30 minutes including physician time.  Signed, Arbutus Leas NP 10/17/2016, 10:50 AM

## 2016-10-17 NOTE — Telephone Encounter (Signed)
Desiree [name], is calling because she was recently discharged from the hospital and her husband (Mr. Arlen) needs a Doctor's note because he was out of work from Thursday 10-16-16 through Monday 10-20-16. Please contact. Thanks.

## 2016-10-18 LAB — HEMOGLOBIN A1C
HEMOGLOBIN A1C: 12.3 % — AB (ref 4.8–5.6)
MEAN PLASMA GLUCOSE: 306 mg/dL

## 2016-10-20 ENCOUNTER — Encounter: Payer: Self-pay | Admitting: Cardiology

## 2016-11-06 ENCOUNTER — Ambulatory Visit (INDEPENDENT_AMBULATORY_CARE_PROVIDER_SITE_OTHER): Payer: BLUE CROSS/BLUE SHIELD | Admitting: Cardiovascular Disease

## 2016-11-06 ENCOUNTER — Encounter: Payer: Self-pay | Admitting: Cardiovascular Disease

## 2016-11-06 VITALS — BP 100/84 | HR 86 | Ht 69.0 in | Wt 262.4 lb

## 2016-11-06 DIAGNOSIS — I2510 Atherosclerotic heart disease of native coronary artery without angina pectoris: Secondary | ICD-10-CM | POA: Diagnosis not present

## 2016-11-06 DIAGNOSIS — I10 Essential (primary) hypertension: Secondary | ICD-10-CM | POA: Diagnosis not present

## 2016-11-06 DIAGNOSIS — E6609 Other obesity due to excess calories: Secondary | ICD-10-CM | POA: Diagnosis not present

## 2016-11-06 DIAGNOSIS — E785 Hyperlipidemia, unspecified: Secondary | ICD-10-CM

## 2016-11-06 DIAGNOSIS — I214 Non-ST elevation (NSTEMI) myocardial infarction: Secondary | ICD-10-CM

## 2016-11-06 DIAGNOSIS — IMO0001 Reserved for inherently not codable concepts without codable children: Secondary | ICD-10-CM

## 2016-11-06 DIAGNOSIS — Z6838 Body mass index (BMI) 38.0-38.9, adult: Secondary | ICD-10-CM

## 2016-11-06 MED ORDER — METOPROLOL TARTRATE 50 MG PO TABS: ORAL_TABLET | ORAL | 3 refills | Status: DC

## 2016-11-06 NOTE — Patient Instructions (Signed)
Medication Instructions:   The metoprolol ( lopressor) has been changed to 1.5 tablets in the morning and 1 tablet in the evening.  Labwork: none  Testing/Procedures:  none  Follow-Up:  3 months with Dr Claiborne Billings.  Any Other Special Instructions Will Be Listed Below (If Applicable).

## 2016-11-07 ENCOUNTER — Telehealth: Payer: Self-pay | Admitting: Cardiovascular Disease

## 2016-11-07 NOTE — Telephone Encounter (Signed)
Patient brought [place] and FMLA and Critical Illness Form.  Also FMLA forms for her husband Bebe Liter) Received Signed Auth/Pmt (Check to [facility]).  Sent to [place] (3 Forms) @ [place] for processing. Sent via [place] 11/06/16. lp

## 2016-11-07 NOTE — Progress Notes (Signed)
Cardiology Office Note    Date:  11/07/2016   ID:  Desiree [name], DOB Feb 27, 1966, MRN [id number]  PCP:  Rogers Blocker, MD  Cardiologist:  Shelva Majestic, MD   3 month follow-up following her non-ST segment elevation MI.  History of Present Illness:  Desiree [name] is a 51 y.o. female who has a history of type 2 diabetes mellitus, obesity, splenic marginal zone B-cell lymphoma in remission, anemia, dyslipidemia, and hypertension.  She was admitted to [facility] hospital on April 08, 2016  in the setting of a non-ST segment elevation MI.  Cardiac catheterization revealed 85% focal stenosis in the mid LAD for which she underwent successful stenting with a Promus premier 2.2512 mm DES stent.  She had mild concomitant CAD with 20% irregularity in the mid RCA and 50% PDA stenoses.  An echo Doppler study showed an EF of 55-60% with mild LVH and Grade 1 diastolic dysfunction.  She was discharged on dual antiplatelet therapy and was seen by Desiree [name], PAC on 04/17/2016 for initial follow-up evaluation.  She had been on only low-dose statin at 10 mg with a history of leg cramping.  When I saw her for initial office evaluation she denied any episodes of chest pain.  She had been on losartan 100 mg and metoprolol 12.5 mg twice a day.  She was not well beta blocker tonight titrated this to 25 mg twice a day.  She also had leg swelling and I recommended she increase her Lasix.  The past several months, she has felt improved.  However, last week she went to the emergency room in [place], [place] with atypical symptoms and was felt to have dyspepsia in etiology of her discomfort.  She denies any exertional chest pain.  She is diabetic on insulin and metformin.  On aspirin and Brilinta for dual antiplatelet therapy.  She presents for reevaluation.  Past Medical History:  Diagnosis Date  . 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 Fatigue 11/17/2013  . High cholesterol   . Hypertension   . Iron deficiency  anemia due to chronic blood loss   . Leukocytosis 07/27/2011  . Myocardial infarction 03/2016  . Non Hodgkin's lymphoma ([place])    recieving chemo 06/2014  . Palpitation 06/30/2014  . Splenic marginal zone b-cell lymphoma ([place]) 01/2009   On observation  . Tachycardia with 100 - 120 beats per minute 11/29/2014  . Type II diabetes mellitus ([place])     Past Surgical History:  Procedure Laterality Date  . BONE MARROW BIOPSY  0215 X 2  . CARDIAC CATHETERIZATION N/A 04/09/2016   Procedure: Left Heart Cath and Coronary Angiography;  Surgeon: Peter M Martinique, MD;  Location: [place] [facility];  Service: Cardiovascular;  Laterality: N/A;  . CARDIAC CATHETERIZATION N/A 04/09/2016   Procedure: Coronary Stent Intervention;  Surgeon: Peter M Martinique, MD;  Location: [place] [facility];  Service: Cardiovascular;  Laterality: N/A;  . CARDIAC CATHETERIZATION N/A 10/16/2016   Procedure: Left Heart Cath and Coronary Angiography;  Surgeon: Peter M Martinique, MD;  Location: [place] [facility];  Service: Cardiovascular;  Laterality: N/A;  . CARDIAC CATHETERIZATION N/A 10/16/2016   Procedure: Coronary Stent Intervention;  Surgeon: Peter M Martinique, MD;  Location: [place] [facility];  Service: Cardiovascular;  Laterality: N/A;  . CARDIAC CATHETERIZATION N/A 10/16/2016   Procedure: Intravascular Ultrasound/IVUS;  Surgeon: Peter M Martinique, MD;  Location: [place] [facility];  Service: Cardiovascular;  Laterality: N/A;  . WISDOM TOOTH EXTRACTION  Current Medications: Outpatient Medications Prior to Visit  Medication Sig Dispense Refill  . acetaminophen (TYLENOL) 500 MG tablet Take 1 tablet (500 mg total) by mouth every 8 (eight) hours as needed (pain). 30 tablet 0  . aspirin 81 MG chewable tablet Chew 1 tablet (81 mg total) by mouth daily. 30 tablet 0  . atorvastatin (LIPITOR) 80 MG tablet Take 1 tablet (80 mg total) by mouth daily at 6 PM. 30 tablet 12  . fish oil-omega-3 fatty acids 1000 MG capsule Take 1 g by mouth 2 (two)  times daily.     . furosemide (LASIX) 20 MG tablet Take 1 tablet (20 mg total) by mouth daily. 30 tablet 12  . insulin aspart (NOVOLOG FLEXPEN) 100 UNIT/ML FlexPen Before each meal 3 times a day, 140-199 - 2 units, 200-250 - 4 units, 251-299 - 6 units,  300-349 - 8 units,  350 or above 10 units. Insulin PEN if approved, provide syringes and needles if needed. 15 mL 1  . losartan (COZAAR) 100 MG tablet Take 100 mg by mouth daily.    . metFORMIN (GLUCOPHAGE) 1000 MG tablet Take 1,000 mg by mouth 2 (two) times daily with a meal.      . nitroGLYCERIN (NITROSTAT) 0.4 MG SL tablet Place 1 tablet (0.4 mg total) under the tongue every 5 (five) minutes as needed for chest pain. 30 tablet 0  . pantoprazole (PROTONIX) 40 MG tablet Take 1 tablet (40 mg total) by mouth daily. 30 tablet 12  . Potassium Chloride ER 20 MEQ TBCR Take 20 mEq by mouth daily.     . Prenatal Vit-Fe Fumarate-FA (MULTIVITAMIN-PRENATAL) 27-0.8 MG TABS tablet Take 2 tablets by mouth daily at 12 noon.    . ticagrelor (BRILINTA) 90 MG TABS tablet Take 1 tablet (90 mg total) by mouth 2 (two) times daily. 180 tablet 3  . valACYclovir (VALTREX) 500 MG tablet Take 500 mg by mouth 2 (two) times daily as needed. Takes for outbreaks only.    . metoprolol tartrate (LOPRESSOR) 50 MG tablet Take 1 tablet (50 mg total) by mouth 2 (two) times daily. 60 tablet 12   No facility-administered medications prior to visit.      Allergies:   Erythromycin; Lovastatin; and Tolnaftate   Social History   Social History  . Marital status: Married    Spouse name: N/A  . Number of children: N/A  . Years of education: N/A   Social History Main Topics  . Smoking status: Former Smoker    Packs/day: 0.50    Years: 22.00    Types: Cigarettes    Quit date: 10/13/2005  . Smokeless tobacco: Never Used  . Alcohol use Yes     Comment: 10/15/2016 "nothing in the last 3 years"  . Drug use: No  . Sexual activity: Yes    Birth control/ protection: None   Other  Topics Concern  . None   Social History Narrative  . None     Family History:  The patient's family history includes Cancer in her maternal aunt and maternal uncle.   ROS General: Positive for obesity;No fevers, chills, or night sweats;  HEENT: Negative; No changes in vision or hearing, sinus congestion, difficulty swallowing Pulmonary: Negative; No cough, wheezing, shortness of breath, hemoptysis Cardiovascular: Negative; No chest pain, presyncope, syncope, palpitations GI: Negative; No nausea, vomiting, diarrhea, or abdominal pain GU: Negative; No dysuria, hematuria, or difficulty voiding Musculoskeletal: Negative; no myalgias, joint pain, or weakness Hematologic/Oncology: Negative; no easy bruising, bleeding Endocrine:  Positive for diabetes mellitus Neuro: Negative; no changes in balance, headaches Skin: Negative; No rashes or skin lesions Psychiatric: Negative; No behavioral problems, depression Sleep: Negative; No snoring, daytime sleepiness, hypersomnolence, bruxism, restless legs, hypnogognic hallucinations, no cataplexy Other comprehensive 14 point system review is negative.   PHYSICAL EXAM:   VS:  BP 100/84 (BP Location: Left Arm, Patient Position: Sitting, Cuff Size: Large)   Pulse 86   Ht '5\' 9"'  (1.753 m)   Wt 262 lb 6 oz (119 kg)   BMI 38.75 kg/m     Repeat blood pressure by me 118/74. Wt Readings from Last 3 Encounters:  11/06/16 262 lb 6 oz (119 kg)  10/17/16 273 lb 9.5 oz (124.1 kg)  08/05/16 270 lb 12.8 oz (122.8 kg)    General: Alert, oriented, no distress.  Skin: normal turgor, no rashes, warm and dry HEENT: Normocephalic, atraumatic. Pupils equal round and reactive to light; sclera anicteric; extraocular muscles intact; Fundi Without hemorrhages or exudates Nose without nasal septal hypertrophy Mouth/Parynx benign; Mallinpatti scale 3 Neck: No JVD, no carotid bruits; normal carotid upstroke Lungs: clear to ausculatation and percussion; no wheezing or  rales Chest wall: without tenderness to palpitation Heart: PMI not displaced, RRR, s1 s2 normal, 1/6 systolic murmur, no diastolic murmur, no rubs, gallops, thrills, or heaves Abdomen: soft, nontender; no hepatosplenomehaly, BS+; abdominal aorta nontender and not dilated by palpation. Back: no CVA tenderness Pulses 2+ Musculoskeletal: full range of motion, normal strength, no joint deformities Extremities: no clubbing cyanosis or edema, Homan's sign negative  Neurologic: grossly nonfocal; Cranial nerves grossly wnl Psychologic: Normal mood and affect   Studies/Labs Reviewed:   ECG (independently read by me): Normal sinus rhythm at 87 bpm.  Nonspecific T changes.  October 2017 ECG (independently read by me): Sinus rhythm at 95 bpm with T-wave inversion in lead 3.  Recent Labs: BMP Latest Ref Rng & Units 10/17/2016 10/16/2016 10/15/2016  Glucose 65 - 99 mg/dL 232(H) 266(H) 282(H)  BUN 6 - 20 mg/dL <5(L) 8 9  Creatinine 0.44 - 1.00 mg/dL 0.59 0.61 0.69  Sodium 135 - 145 mmol/L 140 135 138  Potassium 3.5 - 5.1 mmol/L 3.5 3.2(L) 3.9  Chloride 101 - 111 mmol/L 109 103 101  CO2 22 - 32 mmol/L '24 25 27  ' Calcium 8.9 - 10.3 mg/dL 8.0(L) 8.4(L) 9.2     Hepatic Function Latest Ref Rng & Units 10/16/2016 10/15/2016 06/18/2016  Total Protein 6.5 - 8.1 g/dL 5.6(L) 6.5 6.9  Albumin 3.5 - 5.0 g/dL 3.1(L) 3.7 3.5  AST 15 - 41 U/L '16 17 11  ' ALT 14 - 54 U/L '18 19 16  ' Alk Phosphatase 38 - 126 U/L 71 91 120  Total Bilirubin 0.3 - 1.2 mg/dL 0.4 0.5 0.48  Bilirubin, Direct 0.1 - 0.5 mg/dL - <0.1(L) -    CBC Latest Ref Rng & Units 10/17/2016 10/16/2016 10/15/2016  WBC 4.0 - 10.5 K/uL 8.5 10.9(H) 10.7(H)  Hemoglobin 12.0 - 15.0 g/dL 10.8(L) 11.3(L) 12.6  Hematocrit 36.0 - 46.0 % 34.2(L) 35.1(L) 38.7  Platelets 150 - 400 K/uL 167 174 185   Lab Results  Component Value Date   MCV 72.8 (L) 10/17/2016   MCV 71.5 (L) 10/16/2016   MCV 71.8 (L) 10/15/2016   Lab Results  Component Value Date   TSH 2.266  04/09/2016   Lab Results  Component Value Date   HGBA1C 12.3 (H) 10/17/2016     BNP No results found for: BNP  ProBNP No results found for: PROBNP  Lipid Panel     Component Value Date/Time   CHOL 105 10/16/2016 0234   TRIG 130 10/16/2016 0234   HDL 27 (L) 10/16/2016 0234   CHOLHDL 3.9 10/16/2016 0234   VLDL 26 10/16/2016 0234   LDLCALC 52 10/16/2016 0234     RADIOLOGY: Dg Chest 2 View  Result Date: 10/15/2016 CLINICAL DATA:  Chest pain. EXAM: CHEST  2 VIEW COMPARISON:  CT 04/08/2016.  Chest x-ray 04/08/2016. FINDINGS: Mediastinum Hilar structures normal. Lungs are clear. Heart size normal. Previously identified tiny 5 mm nodule again noted over the right lung base . No pleural effusion or pneumothorax. No focal bony abnormality. IMPRESSION: 1. Previously identified tiny 5 mm pulmonary nodule right lung base again noted. 2. Low lung volumes. Electronically Signed   By: Marcello Moores  Register   On: 10/15/2016 11:52     Additional studies/ records that were reviewed today include:  I have reviewed her 68 through June 29 hospitalization and discharge summary; cardiac catheterization data, echo Doppler study, and PA follow-up office visits.   ASSESSMENT:    1. Essential hypertension   2. CAD in native artery   3. NSTEMI (non-ST elevated myocardial infarction) (HCC)   4. Class 2 obesity due to excess calories with serious comorbidity and body mass index (BMI) of 38.0 to 38.9 in adult      PLAN:  Ms. Desiree [name] is a 51 year old obese African-American female who suffered a non-ST segment elevation myocardial infarction on 04/08/2016 and on 04/09/2016 underwent successful DES stenting to a high-grade mid LAD stenosis.  She has mild concomitant CAD in her RCA.  She has normal LV function and did not sustain significant damage or wall motion abnormality.  She has not had any recurrent anginal symptoms.  Her blood pressure today is stable.  However, her resting pulse is still  elevated in the upper 80s and I am recommending slight additional titration of metoprolol to 75 mg in the morning and 50 mg at night.  She continues to be on losartan 100 mg daily.  She was changed to high potency statin therapy and is now tolerating atorvastatin 80 mg in addition to omega-3 fatty acids.  On 10/16/2016.  Repeat laboratory now showed an LDL that was excellent at 52.  Total cholesterol was 105.  Triglycerides 1:30.  Continues to be on dual antiplatelet therapy without bleeding.  She is diabetic on insulin.  She has GERD which is controlled with pantoprazole.  She recently had dyspeptic symptomatology leading to emergency room evaluation in [place].  I will see her in 3 months for cardiology reevaluation.  Medication Adjustments/Labs and Tests Ordered: Current medicines are reviewed at length with the patient today.  Concerns regarding medicines are outlined above.  Medication changes, Labs and Tests ordered today are listed in the Patient Instructions below. Patient Instructions  Medication Instructions:   The metoprolol ( lopressor) has been changed to 1.5 tablets in the morning and 1 tablet in the evening.  Labwork: none  Testing/Procedures:  none  Follow-Up:  3 months with Dr Claiborne Billings.  Any Other Special Instructions Will Be Listed Below (If Applicable).      Signed, Shelva Majestic, MD, [facility]  11/07/2016 2:00 PM    [place] [address] [place], [place], [place]  [address] Phone: [phone]

## 2016-11-11 ENCOUNTER — Telehealth: Payer: Self-pay | Admitting: Cardiovascular Disease

## 2016-11-11 NOTE — Telephone Encounter (Signed)
Received [employer] FMLA/Disability Statement/Attending Physicians Statement Forms (3 Forms) back from [facility] for Dr Claiborne Billings to review, complete and sign.  Forms given to [facility] for Dr Claiborne Billings to sign. lp

## 2016-11-17 ENCOUNTER — Telehealth (HOSPITAL_COMMUNITY): Payer: Self-pay | Admitting: Internal Medicine

## 2016-11-17 NOTE — Telephone Encounter (Signed)
S/w Nathaneil Canary with [employer], verifying coverage, Deductible $700.00 which has been met, No Co-pay, Out of Pocket, $4000.00 which $4000.00 has been met,  Reference # [id number]  .... Cherylann Banas

## 2016-12-04 IMAGING — US US ABDOMEN LIMITED
1 series · 14 of 25 positions shown · non-contrast
Comparison: Abdominal CT 12/13/2010

CLINICAL DATA: Chest pain syndrome

EXAM:
US ABDOMEN LIMITED - RIGHT UPPER QUADRANT

[Series 1: us abdomen limited · 0.26mm/px · 14 of 38 slices shown]
[im 1/38]
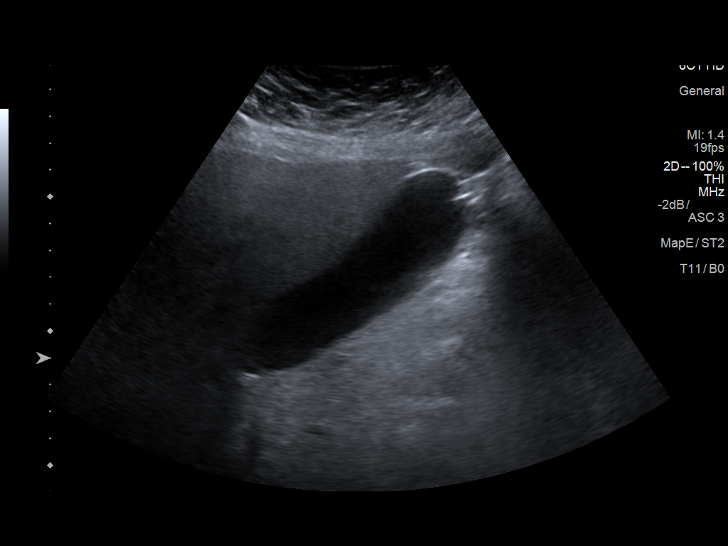
[im 4/38]
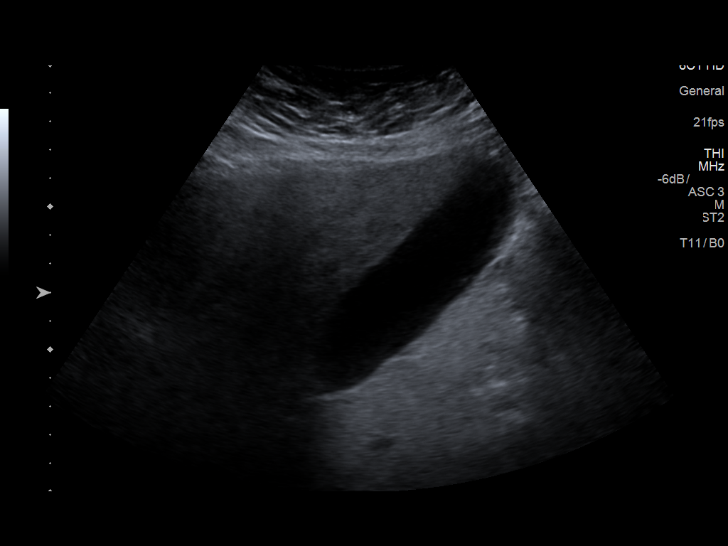
[im 7/38]
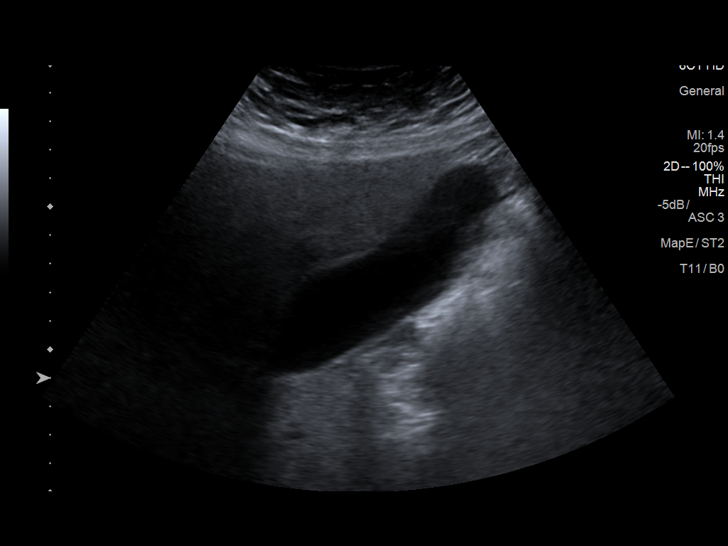
[im 10/38]
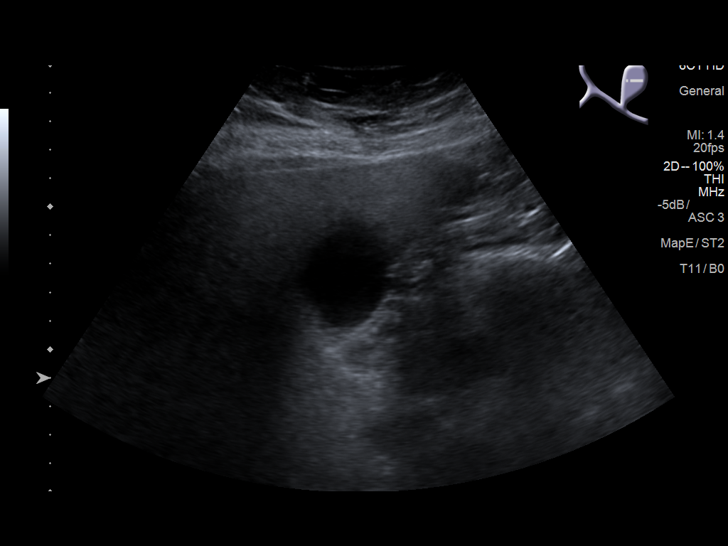
[im 13/38]
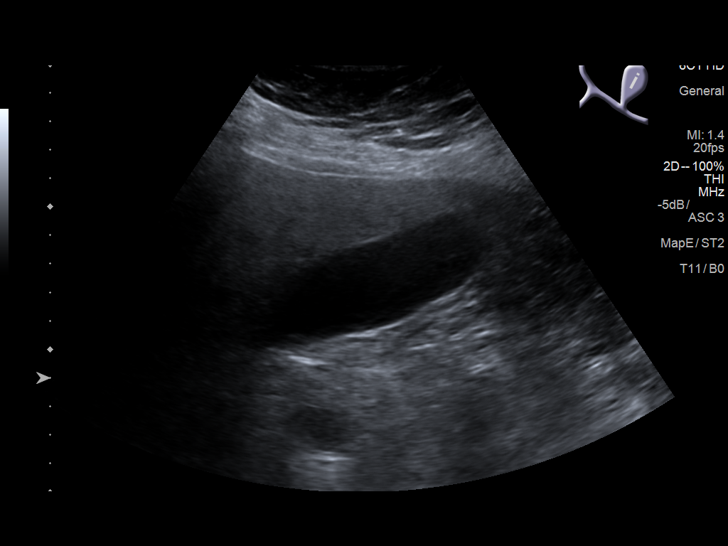
[im 14/38]
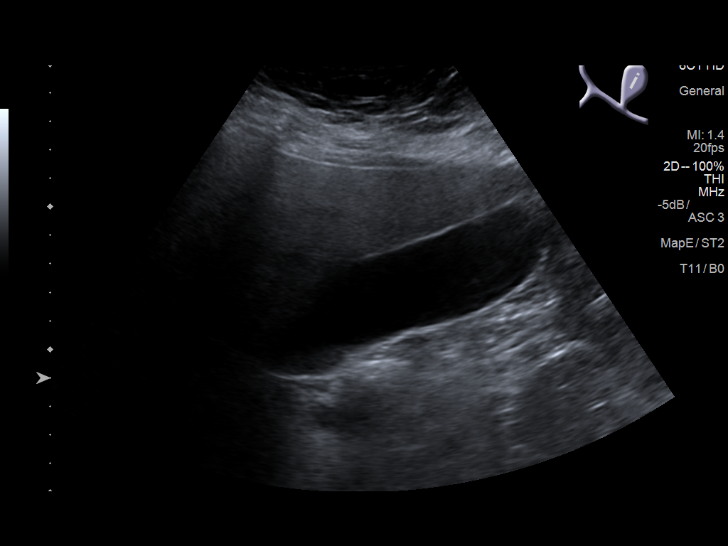
[im 17/38]
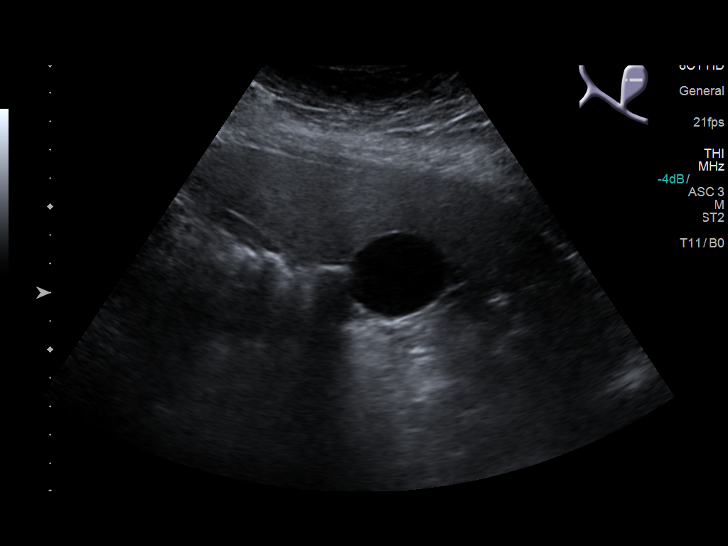
[im 21/38]
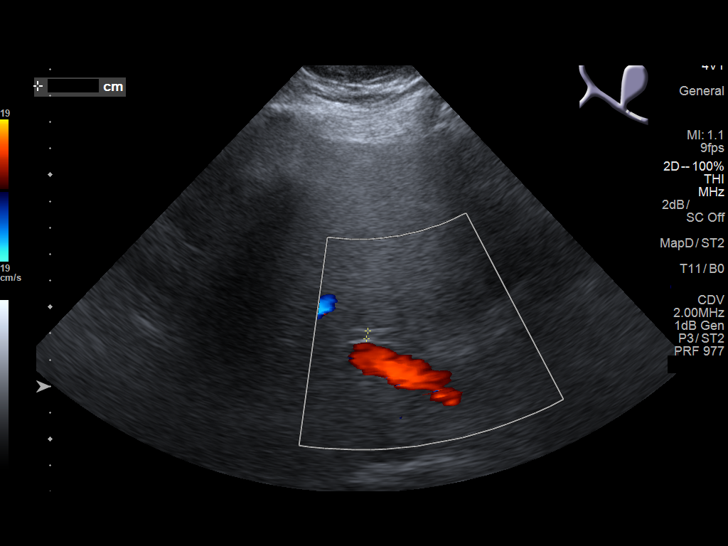
[im 24/38]
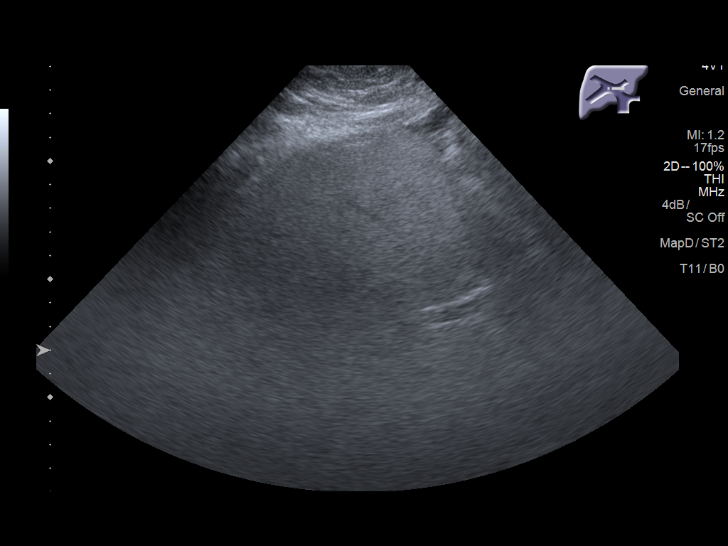
[im 25/38]
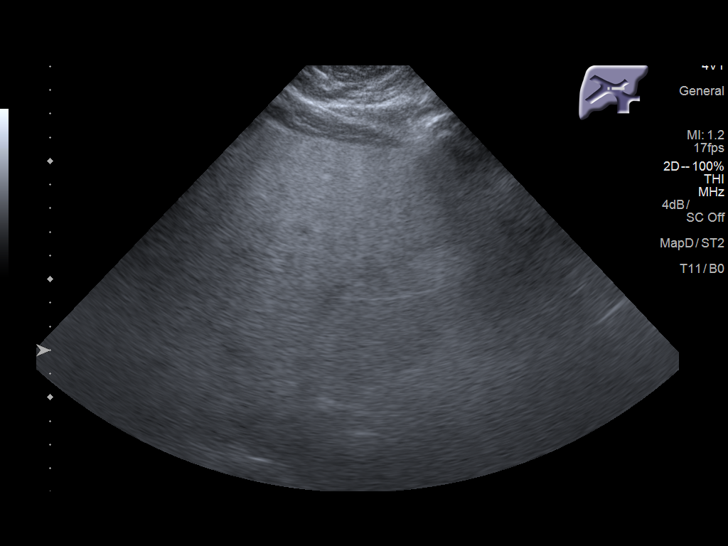
[im 28/38]
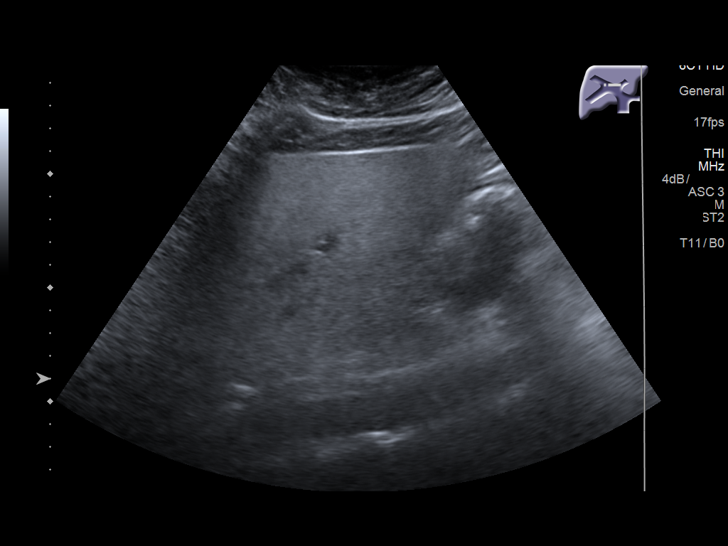
[im 31/38]
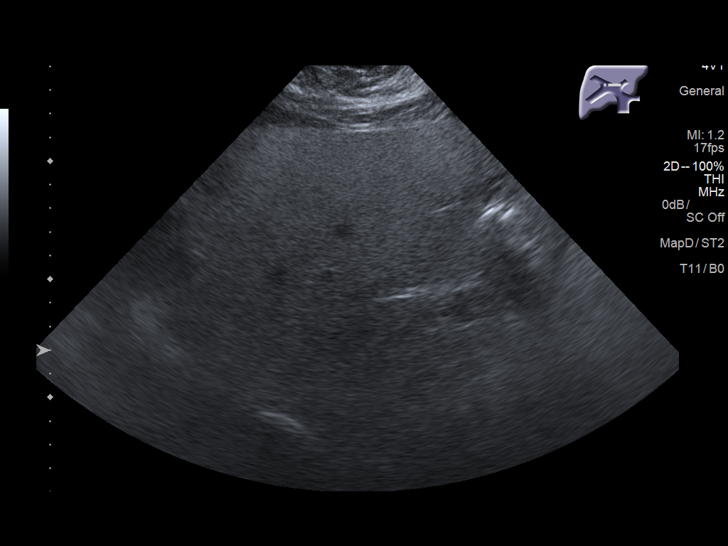
[im 34/38]
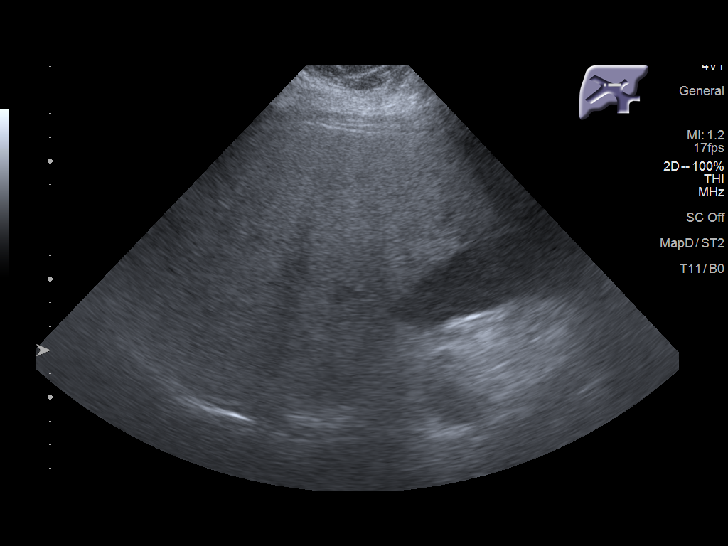
[im 38/38]
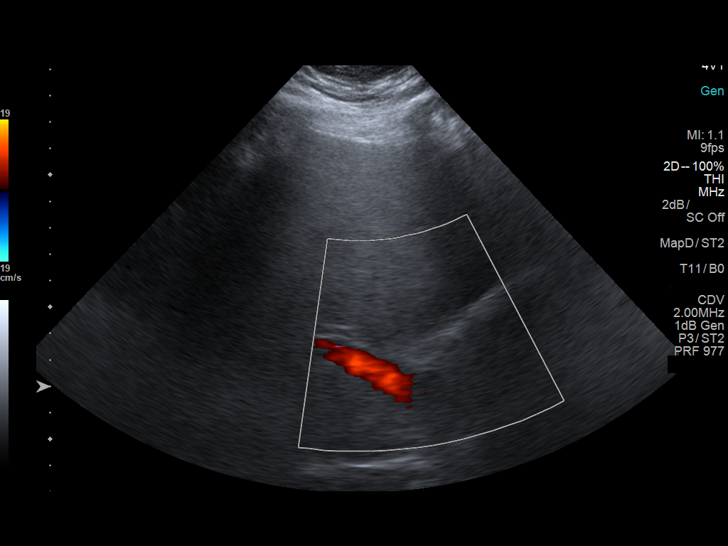

[14 of 25 positions shown; findings below may reference images not displayed]

FINDINGS: Gallbladder:

No gallstones or wall thickening visualized. No sonographic Murphy
sign noted by sonographer.

Common bile duct:

Diameter: 3 mm.  Where visualized, no filling defect.

Liver:

Diffusely echogenic with poor acoustic penetration, limiting the
sensitivity of sonography. Antegrade flow in the imaged portal
venous system. No evidence of mass.
IMPRESSION: 1. Hepatic steatosis.
2. Negative gallbladder.

## 2016-12-11 ENCOUNTER — Other Ambulatory Visit: Payer: Self-pay

## 2016-12-11 MED ORDER — METOPROLOL TARTRATE 50 MG PO TABS: ORAL_TABLET | ORAL | 3 refills | Status: DC

## 2016-12-15 ENCOUNTER — Encounter (HOSPITAL_COMMUNITY): Payer: Self-pay | Admitting: Family Medicine

## 2016-12-15 ENCOUNTER — Ambulatory Visit (HOSPITAL_COMMUNITY)
Admission: EM | Admit: 2016-12-15 | Discharge: 2016-12-15 | Disposition: A | Discharge status: Home / Self Care | Payer: BLUE CROSS/BLUE SHIELD | Attending: Family Medicine | Admitting: Family Medicine

## 2016-12-15 DIAGNOSIS — J0100 Acute maxillary sinusitis, unspecified: Secondary | ICD-10-CM

## 2016-12-15 DIAGNOSIS — R21 Rash and other nonspecific skin eruption: Secondary | ICD-10-CM

## 2016-12-15 MED ORDER — METHYLPREDNISOLONE ACETATE 80 MG/ML IJ SUSP
40.0000 mg | Freq: Once | INTRAMUSCULAR | Status: AC
  Administered 2016-12-15: 40 mg via INTRAMUSCULAR

## 2016-12-15 MED ORDER — METHYLPREDNISOLONE ACETATE 40 MG/ML IJ SUSP
INTRAMUSCULAR | Status: AC
  Filled 2016-12-15: qty 1

## 2016-12-15 MED ORDER — METHYLPREDNISOLONE SODIUM SUCC 125 MG IJ SOLR: 40.0000 mg | Freq: Once | INTRAMUSCULAR | Status: DC

## 2016-12-15 MED ORDER — AMOXICILLIN 875 MG PO TABS: 875.0000 mg | ORAL_TABLET | Freq: Two times a day (BID) | ORAL | 0 refills | Status: DC

## 2016-12-15 MED ORDER — PREDNISONE 10 MG PO TABS: ORAL_TABLET | ORAL | 0 refills | Status: DC

## 2016-12-15 NOTE — ED Provider Notes (Addendum)
Desiree [name]    CSN: [id number] Arrival date & time: 12/15/16  1009     History   Chief Complaint Chief Complaint  Patient presents with  . Facial Pain  . Nasal Congestion  . Dizziness    HPI Desiree [name] is a 51 y.o. female.   This a 51 year old woman who recently came back from [facility] where she was on vacation. She's developed sinus congestion and cough along with some mild dizziness. She's had no fever.  Patient also has developed some left shoulder, muscular type pain in the deltoid region. She had not any difficulty moving her arm but she's having constant soreness there. This is new and has not been present before.  Patient also has a recurring rash that's urticarial in texture and appearance but does not itch. This usually happens during her period.      Past Medical History:  Diagnosis Date  . 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 Fatigue 11/17/2013  . High cholesterol   . Hypertension   . Iron deficiency anemia due to chronic blood loss   . Leukocytosis 07/27/2011  . Myocardial infarction 03/2016  . Non Hodgkin's lymphoma ([place])    recieving chemo 06/2014  . Palpitation 06/30/2014  . Splenic marginal zone b-cell lymphoma ([place]) 01/2009   On observation  . Tachycardia with 100 - 120 beats per minute 11/29/2014  . Type II diabetes mellitus [facility])     Patient Active Problem List   Diagnosis Date Noted  . Status post coronary artery stent placement   . CAD in native artery   . Unstable angina ([place])   . Morbid obesity ([place])   . Hyperlipidemia LDL goal <70 08/07/2016  . Acute chest syndrome ([place]) 04/09/2016  . NSTEMI (non-ST elevated myocardial infarction) ([place])   . Chest pain syndrome 04/08/2016  . Dyslipidemia 04/08/2016  . Pulmonary nodule, right 04/08/2016  . Pain in the chest   . Diabetes mellitus ([place])   . Microcytosis 05/30/2015  . Tachycardia with 100 - 120 beats per minute 11/29/2014  . Fatty liver disease, nonalcoholic  67/67/2094  . Tachycardia 07/03/2014  . Palpitation 06/30/2014  . Acne 06/30/2014  . Hypersensitivity reaction 06/21/2014  . 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 Fatigue 11/17/2013  . Iron deficiency anemia due to chronic blood loss   . Hypertension   . Leukocytosis 07/27/2011  . Splenic marginal zone b-cell lymphoma ([place]) 01/11/2009  . 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 Diabetes mellitus type 2, uncontrolled ([place]) 08/20/2007  . OTHER DRUG ALLERGY 08/20/2007    Past Surgical History:  Procedure Laterality Date  . BONE MARROW BIOPSY  0215 X 2  . CARDIAC CATHETERIZATION N/A 04/09/2016   Procedure: Left Heart Cath and Coronary Angiography;  Surgeon: Peter M Martinique, MD;  Location: [place] [facility];  Service: Cardiovascular;  Laterality: N/A;  . CARDIAC CATHETERIZATION N/A 04/09/2016   Procedure: Coronary Stent Intervention;  Surgeon: Peter M Martinique, MD;  Location: [place] [facility];  Service: Cardiovascular;  Laterality: N/A;  . CARDIAC CATHETERIZATION N/A 10/16/2016   Procedure: Left Heart Cath and Coronary Angiography;  Surgeon: Peter M Martinique, MD;  Location: [place] [facility];  Service: Cardiovascular;  Laterality: N/A;  . CARDIAC CATHETERIZATION N/A 10/16/2016   Procedure: Coronary Stent Intervention;  Surgeon: Peter M Martinique, MD;  Location: [place] [facility];  Service: Cardiovascular;  Laterality: N/A;  . CARDIAC CATHETERIZATION N/A 10/16/2016   Procedure: Intravascular Ultrasound/IVUS;  Surgeon: Peter M Martinique, MD;  Location: [facility]  INVASIVE [facility] LAB;  Service: Cardiovascular;  Laterality: N/A;  . WISDOM TOOTH EXTRACTION      OB History    No data available       Home Medications    Prior to Admission medications   Medication Sig Start Date End Date Taking? Authorizing Provider  acetaminophen (TYLENOL) 500 MG tablet Take 1 tablet (500 mg total) by mouth every 8 (eight) hours as needed (pain). 04/10/16   Florencia Reasons, MD  amoxicillin (AMOXIL) 875 MG tablet Take 1 tablet (875 mg total) by mouth 2  (two) times daily. 12/15/16   Robyn Haber, MD  aspirin 81 MG chewable tablet Chew 1 tablet (81 mg total) by mouth daily. 04/10/16   Florencia Reasons, MD  atorvastatin (LIPITOR) 80 MG tablet Take 1 tablet (80 mg total) by mouth daily at 6 PM. 10/17/16   Arbutus Leas, NP  fish oil-omega-3 fatty acids 1000 MG capsule Take 1 g by mouth 2 (two) times daily.     Historical Provider, MD  furosemide (LASIX) 20 MG tablet Take 1 tablet (20 mg total) by mouth daily. 10/17/16   Arbutus Leas, NP  insulin aspart (NOVOLOG FLEXPEN) 100 UNIT/ML FlexPen Before each meal 3 times a day, 140-199 - 2 units, 200-250 - 4 units, 251-299 - 6 units,  300-349 - 8 units,  350 or above 10 units. Insulin PEN if approved, provide syringes and needles if needed. 04/10/16   Florencia Reasons, MD  losartan (COZAAR) 100 MG tablet Take 100 mg by mouth daily. 12/10/15   Historical Provider, MD  metFORMIN (GLUCOPHAGE) 1000 MG tablet Take 1,000 mg by mouth 2 (two) times daily with a meal.      Historical Provider, MD  metoprolol (LOPRESSOR) 50 MG tablet Take 1.5 tablet ( 75 mg) in the morning and 1 tablet in the PM (50 mg) 12/11/16   Troy Sine, MD  nitroGLYCERIN (NITROSTAT) 0.4 MG SL tablet Place 1 tablet (0.4 mg total) under the tongue every 5 (five) minutes as needed for chest pain. 04/10/16   Florencia Reasons, MD  pantoprazole (PROTONIX) 40 MG tablet Take 1 tablet (40 mg total) by mouth daily. 10/18/16   Arbutus Leas, NP  Potassium Chloride ER 20 MEQ TBCR Take 20 mEq by mouth daily.  05/29/16   Historical Provider, MD  predniSONE (DELTASONE) 10 MG tablet One daily with food 12/15/16   Robyn Haber, MD  Prenatal Vit-Fe Fumarate-FA (MULTIVITAMIN-PRENATAL) 27-0.8 MG TABS tablet Take 2 tablets by mouth daily at 12 noon.    Historical Provider, MD  ticagrelor (BRILINTA) 90 MG TABS tablet Take 1 tablet (90 mg total) by mouth 2 (two) times daily. 08/14/16   Troy Sine, MD  valACYclovir (VALTREX) 500 MG tablet Take 500 mg by mouth 2 (two) times daily as needed. Takes for  outbreaks only. 05/02/14   Historical Provider, MD    Family History Family History  Problem Relation Age of Onset  . Cancer Maternal Aunt     breast ca  . Cancer Maternal Uncle     lung ca    Social History Social History  Substance Use Topics  . Smoking status: Former Smoker    Packs/day: 0.50    Years: 22.00    Types: Cigarettes    Quit date: 10/13/2005  . Smokeless tobacco: Never Used  . Alcohol use Yes     Comment: 10/15/2016 "nothing in the last 3 years"     Allergies   Erythromycin; Lovastatin; and Tolnaftate  Review of Systems Review of Systems  Constitutional: Negative.   HENT: Positive for congestion and sinus pressure.   Respiratory: Positive for cough.   Gastrointestinal: Negative.   Skin: Positive for rash.  Neurological: Negative.      Physical Exam Triage Vital Signs ED Triage Vitals [12/15/16 1028]  Enc Vitals Group     BP 127/87     Pulse Rate 92     Resp 14     Temp 98 F (36.7 C)     Temp Source Oral     SpO2 100 %     Weight      Height      Head Circumference      Peak Flow      Pain Score      Pain Loc      Pain Edu?      Excl. in [place]?    No data found.   Updated Vital Signs BP 127/87 (BP Location: Left Arm)   Pulse 92   Temp 98 F (36.7 C) (Oral)   Resp 14   SpO2 100%    Physical Exam  Constitutional: She is oriented to person, place, and time. She appears well-developed and well-nourished.  HENT:  Head: Normocephalic.  Right Ear: External ear normal.  Left Ear: External ear normal.  Mouth/Throat: Oropharynx is clear and moist.  Eyes: Conjunctivae and EOM are normal. Pupils are equal, round, and reactive to light.  Neck: Normal range of motion. Neck supple.  Cardiovascular: Normal rate, regular rhythm and normal heart sounds.   Pulmonary/Chest: Effort normal. She has wheezes.  Musculoskeletal: Normal range of motion.  Neurological: She is alert and oriented to person, place, and time.  Skin: Skin is warm and dry.    Urticarial rash on arms and torso  Nursing note and vitals reviewed.    UC Treatments / Results  Labs (all labs ordered are listed, but only abnormal results are displayed) Labs Reviewed - No data to display  EKG  EKG Interpretation None       Radiology No results found.  Procedures Procedures (including critical care time)  Medications Ordered in UC Medications  methylPREDNISolone acetate (DEPO-MEDROL) injection 40 mg (not administered)     Initial Impression / Assessment and Plan / UC Course  I have reviewed the triage vital signs and the nursing notes.  Pertinent labs & imaging results that were available during my care of the patient were reviewed by me and considered in my medical decision making (see chart for details).     Final Clinical Impressions(s) / UC Diagnoses   Final diagnoses:  Subacute maxillary sinusitis  Rash    New Prescriptions New Prescriptions   AMOXICILLIN (AMOXIL) 875 MG TABLET    Take 1 tablet (875 mg total) by mouth 2 (two) times daily.   PREDNISONE (DELTASONE) 10 MG TABLET    One daily with food     Robyn Haber, MD 12/15/16 1103    Robyn Haber, MD 12/15/16 [phone]

## 2016-12-15 NOTE — Discharge Instructions (Signed)
Ask your doctor about the rash. 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  Monitor your blood sugar and report levels that are greater than 200 to your doctor

## 2016-12-15 NOTE — ED Triage Notes (Signed)
Pt here for lightheadedness, sinus drainage and congestion, equilibrium is off. sts no energy,

## 2016-12-17 ENCOUNTER — Encounter: Payer: Self-pay | Admitting: Hematology and Oncology

## 2016-12-17 ENCOUNTER — Ambulatory Visit (HOSPITAL_BASED_OUTPATIENT_CLINIC_OR_DEPARTMENT_OTHER): Payer: BLUE CROSS/BLUE SHIELD | Admitting: Hematology and Oncology

## 2016-12-17 ENCOUNTER — Other Ambulatory Visit (HOSPITAL_BASED_OUTPATIENT_CLINIC_OR_DEPARTMENT_OTHER): Payer: BLUE CROSS/BLUE SHIELD

## 2016-12-17 ENCOUNTER — Telehealth: Payer: Self-pay | Admitting: Hematology and Oncology

## 2016-12-17 VITALS — BP 109/72 | HR 83 | Temp 98.1°F | Resp 17 | Ht 69.0 in | Wt 258.7 lb

## 2016-12-17 DIAGNOSIS — C8307 Small cell B-cell lymphoma, spleen: Secondary | ICD-10-CM | POA: Diagnosis not present

## 2016-12-17 DIAGNOSIS — D72829 Elevated white blood cell count, unspecified: Secondary | ICD-10-CM | POA: Diagnosis not present

## 2016-12-17 DIAGNOSIS — R5383 Other fatigue: Secondary | ICD-10-CM | POA: Diagnosis not present

## 2016-12-17 DIAGNOSIS — L509 Urticaria, unspecified: Secondary | ICD-10-CM | POA: Insufficient documentation

## 2016-12-17 DIAGNOSIS — D500 Iron deficiency anemia secondary to blood loss (chronic): Secondary | ICD-10-CM | POA: Diagnosis not present

## 2016-12-17 DIAGNOSIS — M791 Myalgia, unspecified site: Secondary | ICD-10-CM | POA: Insufficient documentation

## 2016-12-17 DIAGNOSIS — N920 Excessive and frequent menstruation with regular cycle: Secondary | ICD-10-CM

## 2016-12-17 LAB — CBC & DIFF AND RETIC
BASO%: 0.9 % (ref 0.0–2.0)
Basophils Absolute: 0.1 10*3/uL (ref 0.0–0.1)
EOS ABS: 0.1 10*3/uL (ref 0.0–0.5)
EOS%: 0.8 % (ref 0.0–7.0)
HEMATOCRIT: 31.3 % — AB (ref 34.8–46.6)
HGB: 9.7 g/dL — ABNORMAL LOW (ref 11.6–15.9)
IMMATURE RETIC FRACT: 15.1 % — AB (ref 1.60–10.00)
LYMPH#: 3.1 10*3/uL (ref 0.9–3.3)
LYMPH%: 24.8 % (ref 14.0–49.7)
MCH: 21.9 pg — ABNORMAL LOW (ref 25.1–34.0)
MCHC: 31 g/dL — AB (ref 31.5–36.0)
MCV: 70.7 fL — ABNORMAL LOW (ref 79.5–101.0)
MONO#: 0.8 10*3/uL (ref 0.1–0.9)
MONO%: 6.5 % (ref 0.0–14.0)
NEUT#: 8.3 10*3/uL — ABNORMAL HIGH (ref 1.5–6.5)
NEUT%: 67 % (ref 38.4–76.8)
PLATELETS: 301 10*3/uL (ref 145–400)
RBC: 4.43 10*6/uL (ref 3.70–5.45)
RDW: 15.9 % — ABNORMAL HIGH (ref 11.2–14.5)
RETIC %: 2.23 % — AB (ref 0.70–2.10)
RETIC CT ABS: 98.79 10*3/uL — AB (ref 33.70–90.70)
WBC: 12.4 10*3/uL — ABNORMAL HIGH (ref 3.9–10.3)
nRBC: 0 % (ref 0–0)

## 2016-12-17 NOTE — Assessment & Plan Note (Signed)
The patient had microcytic anemia, likely due to iron deficiency anemia, secondary to menorrhagia. She is currently on  antiplatelet agents which has caused excessive menstrual bleeding. I would recheck iron study. If confirm iron deficiency anemia, I will get her started on intravenous iron replacement therapy

## 2016-12-17 NOTE — Assessment & Plan Note (Signed)
She complained of myalgia.  I am wondering whether that could be related to high-dose statin. I will check her LFTs in the next visit

## 2016-12-17 NOTE — Assessment & Plan Note (Signed)
She has recent hives of unknown etiology.  It has improved since prednisone therapy. I will reassess in the next visit

## 2016-12-17 NOTE — Progress Notes (Signed)
[place] OFFICE PROGRESS NOTE  Patient Care Team: Rogers Blocker, MD as PCP - General (Internal Medicine) Heath Lark, MD as Consulting Physician (Hematology and Oncology)  SUMMARY OF ONCOLOGIC HISTORY:   Splenic marginal zone b-cell lymphoma ([place])   01/11/2009 Initial Diagnosis    Splenic marginal zone b-cell lymphoma      06/06/2014 Bone Marrow Biopsy    Bone marrow aspirate and biopsy confirmed extensive involvement by CD20 positive non-Hodgkin's lymphoma.      06/07/2014 Imaging    She had a PET scan which showed predominant splenic involvement.      06/16/2014 - 07/07/2014 Chemotherapy    She started on weekly rituximab.      06/16/2014 Adverse Reaction    She had infusion reaction, resolved with IV dexamethasone.      08/18/2014 Imaging    PET CT scan show resolution of hypermetabolic activity.       INTERVAL HISTORY: Please see below for problem oriented charting. She returns today for her 6 months follow-up. She developed bronchitis/sinus infection.  She also broke out in hives recently and have muscle fatigue and spasm. She was prescribed antibiotic therapy and prednisone.  Her hives and sinus infection went away.  She complained of excessive fatigue.  Her cough is improving. The patient have diagnosis of non-ST MI.  After cardiac catheterization, she was placed on high-dose Lipitor and antiplatelet agent with Brilinta. Since then, she complained of heavy menstruation. The patient denies any recent signs or symptoms of bleeding such as spontaneous epistaxis, hematuria or hematochezia.  REVIEW OF SYSTEMS:   Constitutional: Denies fevers, chills or abnormal weight loss Eyes: Denies blurriness of vision Ears, nose, mouth, throat, and face: Denies mucositis or sore throat Cardiovascular: Denies palpitation, chest discomfort or lower extremity swelling Gastrointestinal:  Denies nausea, heartburn or change in bowel habits Skin: Denies abnormal skin  rashes Lymphatics: Denies new lymphadenopathy or easy bruising Neurological:Denies numbness, tingling or new weaknesses Behavioral/Psych: Mood is stable, no new changes  All other systems were reviewed with the patient and are negative.  I have reviewed the past medical history, past surgical history, social history and family history with the patient and they are unchanged from previous note.  ALLERGIES:  is allergic to erythromycin; lovastatin; and tolnaftate.  MEDICATIONS:  Current Outpatient Prescriptions  Medication Sig Dispense Refill  . acetaminophen (TYLENOL) 500 MG tablet Take 1 tablet (500 mg total) by mouth every 8 (eight) hours as needed (pain). 30 tablet 0  . amoxicillin (AMOXIL) 875 MG tablet Take 1 tablet (875 mg total) by mouth 2 (two) times daily. 20 tablet 0  . aspirin 81 MG chewable tablet Chew 1 tablet (81 mg total) by mouth daily. 30 tablet 0  . atorvastatin (LIPITOR) 80 MG tablet Take 1 tablet (80 mg total) by mouth daily at 6 PM. 30 tablet 12  . fish oil-omega-3 fatty acids 1000 MG capsule Take 1 g by mouth 2 (two) times daily.     . furosemide (LASIX) 20 MG tablet Take 1 tablet (20 mg total) by mouth daily. 30 tablet 12  . insulin aspart (NOVOLOG FLEXPEN) 100 UNIT/ML FlexPen Before each meal 3 times a day, 140-199 - 2 units, 200-250 - 4 units, 251-299 - 6 units,  300-349 - 8 units,  350 or above 10 units. Insulin PEN if approved, provide syringes and needles if needed. 15 mL 1  . losartan (COZAAR) 100 MG tablet Take 100 mg by mouth daily.    . metFORMIN (  GLUCOPHAGE) 1000 MG tablet Take 1,000 mg by mouth 2 (two) times daily with a meal.      . metoprolol (LOPRESSOR) 50 MG tablet Take 1.5 tablet ( 75 mg) in the morning and 1 tablet in the PM (50 mg) 225 tablet 3  . pantoprazole (PROTONIX) 40 MG tablet Take 1 tablet (40 mg total) by mouth daily. 30 tablet 12  . Potassium Chloride ER 20 MEQ TBCR Take 20 mEq by mouth daily.     . predniSONE (DELTASONE) 10 MG tablet One  daily with food 5 tablet 0  . Prenatal Vit-Fe Fumarate-FA (MULTIVITAMIN-PRENATAL) 27-0.8 MG TABS tablet Take 2 tablets by mouth daily at 12 noon.    . ticagrelor (BRILINTA) 90 MG TABS tablet Take 1 tablet (90 mg total) by mouth 2 (two) times daily. 180 tablet 3  . valACYclovir (VALTREX) 500 MG tablet Take 500 mg by mouth 2 (two) times daily as needed. Takes for outbreaks only.    . nitroGLYCERIN (NITROSTAT) 0.4 MG SL tablet Place 1 tablet (0.4 mg total) under the tongue every 5 (five) minutes as needed for chest pain. (Patient not taking: Reported on 12/17/2016) 30 tablet 0   No current facility-administered medications for this visit.     PHYSICAL EXAMINATION: ECOG PERFORMANCE STATUS: 1 - Symptomatic but completely ambulatory  Vitals:   12/17/16 1033  BP: 109/72  Pulse: 83  Resp: 17  Temp: 98.1 F (36.7 C)   Filed Weights   12/17/16 1033  Weight: 258 lb 11.2 oz (117.3 kg)    GENERAL:alert, no distress and comfortable SKIN: She has faded hives.  Her skin color is pale EYES: normal, Conjunctiva are pale and non-injected, sclera clear OROPHARYNX:no exudate, no erythema and lips, buccal mucosa, and tongue normal  NECK: supple, thyroid normal size, non-tender, without nodularity LYMPH:  no palpable lymphadenopathy in the cervical, axillary or inguinal LUNGS: clear to auscultation and percussion with normal breathing effort HEART: regular rate & rhythm and no murmurs and no lower extremity edema ABDOMEN:abdomen soft, non-tender and normal bowel sounds Musculoskeletal:no cyanosis of digits and no clubbing  NEURO: alert & oriented x 3 with fluent speech, no focal motor/sensory deficits  LABORATORY DATA:  I have reviewed the data as listed    Component Value Date/Time   NA 140 10/17/2016 0531   NA 138 06/18/2016 0943   K 3.5 10/17/2016 0531   K 4.4 06/18/2016 0943   CL 109 10/17/2016 0531   CL 106 11/19/2012 0837   CO2 24 10/17/2016 0531   CO2 24 06/18/2016 0943   GLUCOSE 232  (H) 10/17/2016 0531   GLUCOSE 275 (H) 06/18/2016 0943   GLUCOSE 107 (H) 11/19/2012 0837   BUN <5 (L) 10/17/2016 0531   BUN 11.7 06/18/2016 0943   CREATININE 0.59 10/17/2016 0531   CREATININE 0.8 06/18/2016 0943   CALCIUM 8.0 (L) 10/17/2016 0531   CALCIUM 9.1 06/18/2016 0943   PROT 5.6 (L) 10/16/2016 0234   PROT 6.9 06/18/2016 0943   ALBUMIN 3.1 (L) 10/16/2016 0234   ALBUMIN 3.5 06/18/2016 0943   AST 16 10/16/2016 0234   AST 11 06/18/2016 0943   ALT 18 10/16/2016 0234   ALT 16 06/18/2016 0943   ALKPHOS 71 10/16/2016 0234   ALKPHOS 120 06/18/2016 0943   BILITOT 0.4 10/16/2016 0234   BILITOT 0.48 06/18/2016 0943   GFRNONAA >60 10/17/2016 0531   GFRAA >60 10/17/2016 0531    No results found for: SPEP, UPEP  Lab Results  Component Value Date  WBC 12.4 (H) 12/17/2016   NEUTROABS 8.3 (H) 12/17/2016   HGB 9.7 (L) 12/17/2016   HCT 31.3 (L) 12/17/2016   MCV 70.7 (L) 12/17/2016   PLT 301 12/17/2016      Chemistry      Component Value Date/Time   NA 140 10/17/2016 0531   NA 138 06/18/2016 0943   K 3.5 10/17/2016 0531   K 4.4 06/18/2016 0943   CL 109 10/17/2016 0531   CL 106 11/19/2012 0837   CO2 24 10/17/2016 0531   CO2 24 06/18/2016 0943   BUN <5 (L) 10/17/2016 0531   BUN 11.7 06/18/2016 0943   CREATININE 0.59 10/17/2016 0531   CREATININE 0.8 06/18/2016 0943      Component Value Date/Time   CALCIUM 8.0 (L) 10/17/2016 0531   CALCIUM 9.1 06/18/2016 0943   ALKPHOS 71 10/16/2016 0234   ALKPHOS 120 06/18/2016 0943   AST 16 10/16/2016 0234   AST 11 06/18/2016 0943   ALT 18 10/16/2016 0234   ALT 16 06/18/2016 0943   BILITOT 0.4 10/16/2016 0234   BILITOT 0.48 06/18/2016 0943      ASSESSMENT & PLAN:  Splenic marginal zone b-cell lymphoma (HCC) I am concerned with the drop of her blood count, whether she is developing hemolytic anemia secondary to disease recurrence Exam is benign. I plan to see her back again in 12 days with repeat blood work and iron studies. If  her anemia is worse, I plan to repeat imaging study to exclude lymphoma recurrence  Iron deficiency anemia due to chronic blood loss The patient had microcytic anemia, likely due to iron deficiency anemia, secondary to menorrhagia. She is currently on  antiplatelet agents which has caused excessive menstrual bleeding. I would recheck iron study. If confirm iron deficiency anemia, I will get her started on intravenous iron replacement therapy  Hives of unknown origin She has recent hives of unknown etiology.  It has improved since prednisone therapy. I will reassess in the next visit  Leukocytosis She has recurrent infection/bronchitis recently and was started on prednisone therapy. The leukocytosis could be related to prednisone or infection. I recommend observe only and I plan to repeat CBC again in her next visit  Myalgia She complained of myalgia.  I am wondering whether that could be related to high-dose statin. I will check her LFTs in the next visit   Orders Placed This Encounter  Procedures  . CBC & Diff and Retic    Standing Status:   Future    Standing Expiration Date:   01/21/2018  . Comprehensive metabolic panel    Standing Status:   Future    Standing Expiration Date:   01/21/2018  . Ferritin    Standing Status:   Future    Standing Expiration Date:   01/21/2018  . Lactate dehydrogenase    Standing Status:   Future    Standing Expiration Date:   01/21/2018  . Iron and TIBC    Standing Status:   Future    Standing Expiration Date:   01/21/2018  . Sedimentation rate    Standing Status:   Future    Standing Expiration Date:   01/21/2018  . Hold Tube, Blood Bank    Standing Status:   Future    Standing Expiration Date:   01/21/2018   All questions were answered. The patient knows to call the clinic with any problems, questions or concerns. No barriers to learning was detected. I spent 20 minutes counseling the patient face  to face. The total time spent in the  appointment was 30 minutes and more than 50% was on counseling and review of test results     Heath Lark, MD 12/17/2016 2:23 PM

## 2016-12-17 NOTE — Assessment & Plan Note (Signed)
I am concerned with the drop of her blood count, whether she is developing hemolytic anemia secondary to disease recurrence Exam is benign. I plan to see her back again in 12 days with repeat blood work and iron studies. If her anemia is worse, I plan to repeat imaging study to exclude lymphoma recurrence

## 2016-12-17 NOTE — Telephone Encounter (Signed)
Gave patient AVS and calender per 12/17/2016 los 

## 2016-12-17 NOTE — Assessment & Plan Note (Signed)
She has recurrent infection/bronchitis recently and was started on prednisone therapy. The leukocytosis could be related to prednisone or infection. I recommend observe only and I plan to repeat CBC again in her next visit

## 2016-12-19 ENCOUNTER — Other Ambulatory Visit: Payer: Self-pay | Admitting: Cardiovascular Disease

## 2016-12-19 MED ORDER — TICAGRELOR 90 MG PO TABS: 90.0000 mg | ORAL_TABLET | Freq: Two times a day (BID) | ORAL | 5 refills | Status: DC

## 2016-12-19 NOTE — Telephone Encounter (Signed)
Rx(s) sent to pharmacy electronically.  

## 2016-12-19 NOTE — Telephone Encounter (Signed)
New message       *STAT* If patient is at the pharmacy, call can be transferred to refill team.   1. Which medications need to be refilled? (please list name of each medication and dose if known)  brilinta  2. Which pharmacy/location (including street and city if local pharmacy) is medication to be sent to? [facility] on [address] in [place]---new pharmacy  3. Do they need a 30 day or 90 day supply?  30 day

## 2016-12-29 ENCOUNTER — Ambulatory Visit (HOSPITAL_BASED_OUTPATIENT_CLINIC_OR_DEPARTMENT_OTHER): Payer: BLUE CROSS/BLUE SHIELD

## 2016-12-29 ENCOUNTER — Encounter: Payer: Self-pay | Admitting: Hematology and Oncology

## 2016-12-29 ENCOUNTER — Other Ambulatory Visit (HOSPITAL_BASED_OUTPATIENT_CLINIC_OR_DEPARTMENT_OTHER): Payer: BLUE CROSS/BLUE SHIELD

## 2016-12-29 ENCOUNTER — Telehealth: Payer: Self-pay | Admitting: Hematology and Oncology

## 2016-12-29 ENCOUNTER — Ambulatory Visit (HOSPITAL_BASED_OUTPATIENT_CLINIC_OR_DEPARTMENT_OTHER): Payer: BLUE CROSS/BLUE SHIELD | Admitting: Hematology and Oncology

## 2016-12-29 VITALS — BP 129/74 | HR 80 | Temp 97.7°F | Resp 20 | Ht 69.0 in | Wt 258.0 lb

## 2016-12-29 VITALS — BP 107/65 | HR 76 | Temp 97.1°F | Resp 18

## 2016-12-29 DIAGNOSIS — C884 Extranodal marginal zone B-cell lymphoma of mucosa-associated lymphoid tissue [MALT-lymphoma]: Secondary | ICD-10-CM

## 2016-12-29 DIAGNOSIS — C8307 Small cell B-cell lymphoma, spleen: Secondary | ICD-10-CM

## 2016-12-29 DIAGNOSIS — D509 Iron deficiency anemia, unspecified: Secondary | ICD-10-CM

## 2016-12-29 DIAGNOSIS — D500 Iron deficiency anemia secondary to blood loss (chronic): Secondary | ICD-10-CM

## 2016-12-29 DIAGNOSIS — D72829 Elevated white blood cell count, unspecified: Secondary | ICD-10-CM

## 2016-12-29 LAB — IRON AND TIBC
%SAT: 11 % — ABNORMAL LOW (ref 21–57)
IRON: 31 ug/dL — AB (ref 41–142)
TIBC: 280 ug/dL (ref 236–444)
UIBC: 249 ug/dL (ref 120–384)

## 2016-12-29 LAB — CBC & DIFF AND RETIC
BASO%: 0.3 % (ref 0.0–2.0)
BASOS ABS: 0 10*3/uL (ref 0.0–0.1)
EOS%: 0.9 % (ref 0.0–7.0)
Eosinophils Absolute: 0.1 10*3/uL (ref 0.0–0.5)
HEMATOCRIT: 37.4 % (ref 34.8–46.6)
HGB: 11.3 g/dL — ABNORMAL LOW (ref 11.6–15.9)
Immature Retic Fract: 12.3 % — ABNORMAL HIGH (ref 1.60–10.00)
LYMPH%: 29.1 % (ref 14.0–49.7)
MCH: 22.3 pg — AB (ref 25.1–34.0)
MCHC: 30.2 g/dL — AB (ref 31.5–36.0)
MCV: 73.9 fL — ABNORMAL LOW (ref 79.5–101.0)
MONO#: 0.5 10*3/uL (ref 0.1–0.9)
MONO%: 4.6 % (ref 0.0–14.0)
NEUT#: 7.3 10*3/uL — ABNORMAL HIGH (ref 1.5–6.5)
NEUT%: 65.1 % (ref 38.4–76.8)
Platelets: 193 10*3/uL (ref 145–400)
RBC: 5.06 10*6/uL (ref 3.70–5.45)
RDW: 16.7 % — ABNORMAL HIGH (ref 11.2–14.5)
RETIC %: 2.07 % (ref 0.70–2.10)
Retic Ct Abs: 104.74 10*3/uL — ABNORMAL HIGH (ref 33.70–90.70)
WBC: 11.2 10*3/uL — AB (ref 3.9–10.3)
lymph#: 3.3 10*3/uL (ref 0.9–3.3)

## 2016-12-29 LAB — COMPREHENSIVE METABOLIC PANEL
ALT: 47 U/L (ref 0–55)
ANION GAP: 13 meq/L — AB (ref 3–11)
AST: 13 U/L (ref 5–34)
Albumin: 3.6 g/dL (ref 3.5–5.0)
Alkaline Phosphatase: 125 U/L (ref 40–150)
BUN: 10.1 mg/dL (ref 7.0–26.0)
CALCIUM: 9.1 mg/dL (ref 8.4–10.4)
CHLORIDE: 103 meq/L (ref 98–109)
CO2: 27 mEq/L (ref 22–29)
Creatinine: 0.8 mg/dL (ref 0.6–1.1)
Glucose: 263 mg/dl — ABNORMAL HIGH (ref 70–140)
POTASSIUM: 3.8 meq/L (ref 3.5–5.1)
Sodium: 143 mEq/L (ref 136–145)
Total Bilirubin: 0.59 mg/dL (ref 0.20–1.20)
Total Protein: 6.6 g/dL (ref 6.4–8.3)

## 2016-12-29 LAB — LACTATE DEHYDROGENASE: LDH: 213 U/L (ref 125–245)

## 2016-12-29 LAB — FERRITIN: FERRITIN: 28 ng/mL (ref 9–269)

## 2016-12-29 MED ORDER — SODIUM CHLORIDE 0.9 % IV SOLN
510.0000 mg | Freq: Once | INTRAVENOUS | Status: AC
  Administered 2016-12-29: 510 mg via INTRAVENOUS
  Filled 2016-12-29: qty 17

## 2016-12-29 MED ORDER — SODIUM CHLORIDE 0.9 % IV SOLN
Freq: Once | INTRAVENOUS | Status: AC
  Administered 2016-12-29: 12:00:00 via INTRAVENOUS

## 2016-12-29 NOTE — Assessment & Plan Note (Signed)
She has improvement of her blood counts recently. I suspect the cause of her anemia is due to iron deficiency. She does not need blood transfusion. I plan to see her back again in 3 weeks after iron infusion

## 2016-12-29 NOTE — Patient Instructions (Signed)

## 2016-12-29 NOTE — Assessment & Plan Note (Signed)
The most likely cause of her anemia is due to chronic blood loss/malabsorption syndrome. We discussed some of the risks, benefits, and alternatives of intravenous iron infusions. The patient is symptomatic from anemia and the iron level is critically low. She tolerated oral iron supplement poorly and desires to achieved higher levels of iron faster for adequate hematopoesis. Some of the side-effects to be expected including risks of infusion reactions, phlebitis, headaches, nausea and fatigue.  The patient is willing to proceed. Patient education material was dispensed.  Goal is to keep ferritin level greater than 50 Due to her recent cardiac condition and menorrhagia, I would not wait for iron studies to come back. The patient had microcytosis dated back to 10 years. I suspect she may have undiagnosed thalassemia. I plan to check thalassemia workup in her next visit

## 2016-12-29 NOTE — Assessment & Plan Note (Signed)
She has recurrent infection/bronchitis recently and was started on prednisone therapy. The leukocytosis could be related to prednisone or infection. I recommend observe only

## 2016-12-29 NOTE — Progress Notes (Signed)
[facility] OFFICE PROGRESS NOTE  Patient Care Team: Rogers Blocker, MD as PCP - General (Internal Medicine) Heath Lark, MD as Consulting Physician (Hematology and Oncology)  SUMMARY OF ONCOLOGIC HISTORY:   Splenic marginal zone b-cell lymphoma ([place])   01/11/2009 Initial Diagnosis    Splenic marginal zone b-cell lymphoma      06/06/2014 Bone Marrow Biopsy    Bone marrow aspirate and biopsy confirmed extensive involvement by CD20 positive non-Hodgkin's lymphoma.      06/07/2014 Imaging    She had a PET scan which showed predominant splenic involvement.      06/16/2014 - 07/07/2014 Chemotherapy    She started on weekly rituximab.      06/16/2014 Adverse Reaction    She had infusion reaction, resolved with IV dexamethasone.      08/18/2014 Imaging    PET CT scan show resolution of hypermetabolic activity.       INTERVAL HISTORY: Please see below for problem oriented charting. She returns for further follow-up. She felt a little better. Denies recent chest pain or shortness of breath The patient denies any recent signs or symptoms of bleeding such as spontaneous epistaxis, hematuria or hematochezia. No recent lymphadenopathy No recent recurrence of hives  REVIEW OF SYSTEMS:   Constitutional: Denies fevers, chills or abnormal weight loss Eyes: Denies blurriness of vision Ears, nose, mouth, throat, and face: Denies mucositis or sore throat Respiratory: Denies cough, dyspnea or wheezes Cardiovascular: Denies palpitation, chest discomfort or lower extremity swelling Gastrointestinal:  Denies nausea, heartburn or change in bowel habits Skin: Denies abnormal skin rashes Lymphatics: Denies new lymphadenopathy or easy bruising Neurological:Denies numbness, tingling or new weaknesses Behavioral/Psych: Mood is stable, no new changes  All other systems were reviewed with the patient and are negative.  I have reviewed the past medical history, past surgical history, social  history and family history with the patient and they are unchanged from previous note.  ALLERGIES:  is allergic to erythromycin; lovastatin; and tolnaftate.  MEDICATIONS:  Current Outpatient Prescriptions  Medication Sig Dispense Refill  . acetaminophen (TYLENOL) 500 MG tablet Take 1 tablet (500 mg total) by mouth every 8 (eight) hours as needed (pain). 30 tablet 0  . aspirin 81 MG chewable tablet Chew 1 tablet (81 mg total) by mouth daily. 30 tablet 0  . atorvastatin (LIPITOR) 80 MG tablet Take 1 tablet (80 mg total) by mouth daily at 6 PM. 30 tablet 12  . fish oil-omega-3 fatty acids 1000 MG capsule Take 1 g by mouth 2 (two) times daily.     . furosemide (LASIX) 20 MG tablet Take 1 tablet (20 mg total) by mouth daily. 30 tablet 12  . insulin aspart (NOVOLOG FLEXPEN) 100 UNIT/ML FlexPen Before each meal 3 times a day, 140-199 - 2 units, 200-250 - 4 units, 251-299 - 6 units,  300-349 - 8 units,  350 or above 10 units. Insulin PEN if approved, provide syringes and needles if needed. 15 mL 1  . losartan (COZAAR) 100 MG tablet Take 100 mg by mouth daily.    . metFORMIN (GLUCOPHAGE) 1000 MG tablet Take 1,000 mg by mouth 2 (two) times daily with a meal.      . metoprolol (LOPRESSOR) 50 MG tablet Take 1.5 tablet ( 75 mg) in the morning and 1 tablet in the PM (50 mg) 225 tablet 3  . pantoprazole (PROTONIX) 40 MG tablet Take 1 tablet (40 mg total) by mouth daily. 30 tablet 12  . Potassium Chloride ER  20 MEQ TBCR Take 20 mEq by mouth daily.     . Prenatal Vit-Fe Fumarate-FA (MULTIVITAMIN-PRENATAL) 27-0.8 MG TABS tablet Take 2 tablets by mouth daily at 12 noon.    . ticagrelor (BRILINTA) 90 MG TABS tablet Take 1 tablet (90 mg total) by mouth 2 (two) times daily. 60 tablet 5  . valACYclovir (VALTREX) 500 MG tablet Take 500 mg by mouth 2 (two) times daily as needed. Takes for outbreaks only.    . nitroGLYCERIN (NITROSTAT) 0.4 MG SL tablet Place 1 tablet (0.4 mg total) under the tongue every 5 (five)  minutes as needed for chest pain. (Patient not taking: Reported on 12/17/2016) 30 tablet 0   No current facility-administered medications for this visit.     PHYSICAL EXAMINATION: ECOG PERFORMANCE STATUS: 1 - Symptomatic but completely ambulatory  Vitals:   12/29/16 1057  BP: 129/74  Pulse: 80  Resp: 20  Temp: 97.7 F (36.5 C)   Filed Weights   12/29/16 1057  Weight: 258 lb (117 kg)    GENERAL:alert, no distress and comfortable SKIN: skin color, texture, turgor are normal, no rashes or significant lesions EYES: normal, Conjunctiva are pink and non-injected, sclera clear Musculoskeletal:no cyanosis of digits and no clubbing  NEURO: alert & oriented x 3 with fluent speech, no focal motor/sensory deficits  LABORATORY DATA:  I have reviewed the data as listed    Component Value Date/Time   NA 140 10/17/2016 0531   NA 138 06/18/2016 0943   K 3.5 10/17/2016 0531   K 4.4 06/18/2016 0943   CL 109 10/17/2016 0531   CL 106 11/19/2012 0837   CO2 24 10/17/2016 0531   CO2 24 06/18/2016 0943   GLUCOSE 232 (H) 10/17/2016 0531   GLUCOSE 275 (H) 06/18/2016 0943   GLUCOSE 107 (H) 11/19/2012 0837   BUN <5 (L) 10/17/2016 0531   BUN 11.7 06/18/2016 0943   CREATININE 0.59 10/17/2016 0531   CREATININE 0.8 06/18/2016 0943   CALCIUM 8.0 (L) 10/17/2016 0531   CALCIUM 9.1 06/18/2016 0943   PROT 5.6 (L) 10/16/2016 0234   PROT 6.9 06/18/2016 0943   ALBUMIN 3.1 (L) 10/16/2016 0234   ALBUMIN 3.5 06/18/2016 0943   AST 16 10/16/2016 0234   AST 11 06/18/2016 0943   ALT 18 10/16/2016 0234   ALT 16 06/18/2016 0943   ALKPHOS 71 10/16/2016 0234   ALKPHOS 120 06/18/2016 0943   BILITOT 0.4 10/16/2016 0234   BILITOT 0.48 06/18/2016 0943   GFRNONAA >60 10/17/2016 0531   GFRAA >60 10/17/2016 0531    No results found for: SPEP, UPEP  Lab Results  Component Value Date   WBC 11.2 (H) 12/29/2016   NEUTROABS 7.3 (H) 12/29/2016   HGB 11.3 (L) 12/29/2016   HCT 37.4 12/29/2016   MCV 73.9 (L)  12/29/2016   PLT 193 12/29/2016      Chemistry      Component Value Date/Time   NA 140 10/17/2016 0531   NA 138 06/18/2016 0943   K 3.5 10/17/2016 0531   K 4.4 06/18/2016 0943   CL 109 10/17/2016 0531   CL 106 11/19/2012 0837   CO2 24 10/17/2016 0531   CO2 24 06/18/2016 0943   BUN <5 (L) 10/17/2016 0531   BUN 11.7 06/18/2016 0943   CREATININE 0.59 10/17/2016 0531   CREATININE 0.8 06/18/2016 0943      Component Value Date/Time   CALCIUM 8.0 (L) 10/17/2016 0531   CALCIUM 9.1 06/18/2016 0943   ALKPHOS 71 10/16/2016 0234  ALKPHOS 120 06/18/2016 0943   AST 16 10/16/2016 0234   AST 11 06/18/2016 0943   ALT 18 10/16/2016 0234   ALT 16 06/18/2016 0943   BILITOT 0.4 10/16/2016 0234   BILITOT 0.48 06/18/2016 0943     ASSESSMENT & PLAN:  Splenic marginal zone b-cell lymphoma ([facility]CC) She has improvement of her blood counts recently. I suspect the cause of her anemia is due to iron deficiency. She does not need blood transfusion. I plan to see her back again in 3 weeks after iron infusion  Iron deficiency anemia due to chronic blood loss The most likely cause of her anemia is due to chronic blood loss/malabsorption syndrome. We discussed some of the risks, benefits, and alternatives of intravenous iron infusions. The patient is symptomatic from anemia and the iron level is critically low. She tolerated oral iron supplement poorly and desires to achieved higher levels of iron faster for adequate hematopoesis. Some of the side-effects to be expected including risks of infusion reactions, phlebitis, headaches, nausea and fatigue.  The patient is willing to proceed. Patient education material was dispensed.  Goal is to keep ferritin level greater than 50 Due to her recent cardiac condition and menorrhagia, I would not wait for iron studies to come back. The patient had microcytosis dated back to 10 years. I suspect she may have undiagnosed thalassemia. I plan to check thalassemia workup  in her next visit   Leukocytosis She has recurrent infection/bronchitis recently and was started on prednisone therapy. The leukocytosis could be related to prednisone or infection. I recommend observe only    Orders Placed This Encounter  Procedures  . CBC & Diff and Retic    Standing Status:   Future    Standing Expiration Date:   02/02/2018  . Ferritin    Standing Status:   Future    Standing Expiration Date:   02/02/2018  . Hemoglobinopathy evaluation    Standing Status:   Future    Standing Expiration Date:   02/02/2018  . Haptoglobin    Standing Status:   Future    Standing Expiration Date:   02/02/2018  . Hold Tube, Blood Bank    Standing Status:   Future    Standing Expiration Date:   02/02/2018   All questions were answered. The patient knows to call the clinic with any problems, questions or concerns. No barriers to learning was detected. I spent 15 minutes counseling the patient face to face. The total time spent in the appointment was 20 minutes and more than 50% was on counseling and review of test results     Heath Lark, MD 12/29/2016 11:35 AM

## 2016-12-29 NOTE — Telephone Encounter (Signed)
Appointments scheduled per 3.19.18 LOS. Patient given AVS report and calendars with future scheduled appointments.  °

## 2016-12-30 LAB — SEDIMENTATION RATE: SED RATE: 4 mm/h (ref 0–40)

## 2016-12-30 MED ORDER — ACETAMINOPHEN 325 MG PO TABS
ORAL_TABLET | ORAL | Status: AC
  Filled 2016-12-30: qty 2

## 2016-12-30 MED ORDER — DIPHENOXYLATE-ATROPINE 2.5-0.025 MG PO TABS
ORAL_TABLET | ORAL | Status: AC
  Filled 2016-12-30: qty 2

## 2016-12-30 MED ORDER — DIPHENHYDRAMINE HCL 25 MG PO CAPS
ORAL_CAPSULE | ORAL | Status: AC
  Filled 2016-12-30: qty 2

## 2017-01-20 ENCOUNTER — Other Ambulatory Visit: Payer: Self-pay | Admitting: *Deleted

## 2017-01-20 MED ORDER — TICAGRELOR 90 MG PO TABS: 90.0000 mg | ORAL_TABLET | Freq: Two times a day (BID) | ORAL | 5 refills | Status: DC

## 2017-01-20 NOTE — Telephone Encounter (Signed)
REFILL 

## 2017-01-22 ENCOUNTER — Ambulatory Visit (INDEPENDENT_AMBULATORY_CARE_PROVIDER_SITE_OTHER): Payer: BLUE CROSS/BLUE SHIELD | Admitting: Cardiovascular Disease

## 2017-01-22 ENCOUNTER — Encounter: Payer: Self-pay | Admitting: Cardiovascular Disease

## 2017-01-22 VITALS — BP 124/86 | HR 93 | Ht 68.0 in | Wt 260.0 lb

## 2017-01-22 DIAGNOSIS — I2510 Atherosclerotic heart disease of native coronary artery without angina pectoris: Secondary | ICD-10-CM

## 2017-01-22 DIAGNOSIS — R000 Tachycardia, unspecified: Secondary | ICD-10-CM

## 2017-01-22 DIAGNOSIS — I10 Essential (primary) hypertension: Secondary | ICD-10-CM

## 2017-01-22 DIAGNOSIS — E785 Hyperlipidemia, unspecified: Secondary | ICD-10-CM

## 2017-01-22 DIAGNOSIS — E118 Type 2 diabetes mellitus with unspecified complications: Secondary | ICD-10-CM

## 2017-01-22 DIAGNOSIS — R002 Palpitations: Secondary | ICD-10-CM

## 2017-01-22 DIAGNOSIS — Z794 Long term (current) use of insulin: Secondary | ICD-10-CM | POA: Diagnosis not present

## 2017-01-22 DIAGNOSIS — R0689 Other abnormalities of breathing: Secondary | ICD-10-CM

## 2017-01-22 DIAGNOSIS — R400 Somnolence: Secondary | ICD-10-CM | POA: Diagnosis not present

## 2017-01-22 DIAGNOSIS — R5383 Other fatigue: Secondary | ICD-10-CM | POA: Diagnosis not present

## 2017-01-22 MED ORDER — METOPROLOL TARTRATE 100 MG PO TABS: 100.0000 mg | ORAL_TABLET | Freq: Two times a day (BID) | ORAL | 11 refills | Status: DC

## 2017-01-22 NOTE — Patient Instructions (Signed)
Your physician has recommended you make the following change in your medication:   1.) the metoprolol ( lopressor) has been increased to 100 mg twice a day. A new prescription has been sent to your pharmacy to reflect this change.  Your physician has recommended that you have a sleep study. This test records several body functions during sleep, including: brain activity, eye movement, oxygen and carbon dioxide blood levels, heart rate and rhythm, breathing rate and rhythm, the flow of air through your mouth and nose, snoring, body muscle movements, and chest and belly movement. This will be scheduled at [place].  Your physician recommends that you schedule a follow-up appointment in: 4 months with Dr Claiborne Billings.

## 2017-01-22 NOTE — Progress Notes (Signed)
Cardiology Office Note    Date:  01/22/2017   ID:  Desiree [name], DOB [birth year], MRN [id number]  PCP:  Desiree Blocker, MD  Cardiologist:  Shelva Majestic, MD   3 month follow-up   History of Present Illness:  Desiree [name] is a 51 y.o. female who has a history of type 2 diabetes mellitus, obesity, splenic marginal zone B-cell lymphoma in remission, anemia, dyslipidemia, and hypertension.  She was admitted to [facility] hospital on April 08, 2016  in the setting of a non-ST segment elevation MI.  Cardiac catheterization revealed 85% focal stenosis in the mid LAD for which she underwent successful stenting with a Promus premier 2.2512 mm DES stent.  She had mild concomitant CAD with 20% irregularity in the mid RCA and 50% PDA stenoses.  An echo Doppler study showed an EF of 55-60% with mild LVH and Grade 1 diastolic dysfunction.  She was discharged on dual antiplatelet therapy and was seen by Kathlene November, PAC on 04/17/2016 for initial follow-up evaluation.  She had been on only low-dose statin at 10 mg with a history of leg cramping.  When I saw her for initial office evaluation she denied any episodes of chest pain.  She had been on losartan 100 mg and metoprolol 12.5 mg twice a day.  She was not well beta [name] tonight titrated this to 25 mg twice a day.  She also had leg swelling and I recommended she increase her Lasix.  When I saw her 3 months ago she had recently gone to the emergency room in [place], [place] with atypical symptoms and was felt to have dyspepsia in etiology of her discomfort.  She dis not have any exertional chest pain.  She is diabetic on insulin and metformin.  On aspirin and Brilinta for dual antiplatelet therapy.    When I saw her, her resting pulse was elevated and I titrated metoprolol from 50 g twice a day to 75 mg in the morning and 50 mg at night.  She continued to be on losartan 100 mg daily.  I had previously changed her to atorvastatin 80 mg in addition to  omega-3 fatty acids and subsequent laboratory showed improvement in LDL down to 52.  She has not noticed any significant change in her pulse.  At times, she has been awakened with a sensation of her heart racing and believes she may awaken gasping for breath.  She has been trying to walk at least 5 days per week up to 30 minutes at a time.  Typically she goes to bed at 9:30 and wakes up at 5:30.  She continues to notice some feet and ankle swelling in the latter part of the day and has been taking her Lasix dose before going to bed.  She was recently iron deficient and has undergone a 13 infusion and will be having another one in the near future.  She presents for cardiology evaluation.    Past Medical History:  Diagnosis Date  . 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 Fatigue 11/17/2013  . High cholesterol   . Hypertension   . Iron deficiency anemia due to chronic blood loss   . Leukocytosis 07/27/2011  . Myocardial infarction 03/2016  . Non Hodgkin's lymphoma ([place])    recieving chemo 06/2014  . Palpitation 06/30/2014  . Splenic marginal zone b-cell lymphoma ([place]) 01/2009   On observation  . Tachycardia with 100 - 120 beats per minute 11/29/2014  . Type II diabetes mellitus ([place])  Past Surgical History:  Procedure Laterality Date  . BONE MARROW BIOPSY  0215 X 2  . CARDIAC CATHETERIZATION N/A 04/09/2016   Procedure: Left Heart Cath and Coronary Angiography;  Surgeon: Peter M Martinique, MD;  Location: [place] [facility];  Service: Cardiovascular;  Laterality: N/A;  . CARDIAC CATHETERIZATION N/A 04/09/2016   Procedure: Coronary Stent Intervention;  Surgeon: Peter M Martinique, MD;  Location: [place] [facility];  Service: Cardiovascular;  Laterality: N/A;  . CARDIAC CATHETERIZATION N/A 10/16/2016   Procedure: Left Heart Cath and Coronary Angiography;  Surgeon: Peter M Martinique, MD;  Location: [place] [facility];  Service: Cardiovascular;  Laterality: N/A;  . CARDIAC CATHETERIZATION N/A 10/16/2016    Procedure: Coronary Stent Intervention;  Surgeon: Peter M Martinique, MD;  Location: [place] [facility];  Service: Cardiovascular;  Laterality: N/A;  . CARDIAC CATHETERIZATION N/A 10/16/2016   Procedure: Intravascular Ultrasound/IVUS;  Surgeon: Peter M Martinique, MD;  Location: [place] [facility];  Service: Cardiovascular;  Laterality: N/A;  . WISDOM TOOTH EXTRACTION      Current Medications: Outpatient Medications Prior to Visit  Medication Sig Dispense Refill  . acetaminophen (TYLENOL) 500 MG tablet Take 1 tablet (500 mg total) by mouth every 8 (eight) hours as needed (pain). 30 tablet 0  . aspirin 81 MG chewable tablet Chew 1 tablet (81 mg total) by mouth daily. 30 tablet 0  . atorvastatin (LIPITOR) 80 MG tablet Take 1 tablet (80 mg total) by mouth daily at 6 PM. 30 tablet 12  . fish oil-omega-3 fatty acids 1000 MG capsule Take 1 g by mouth 2 (two) times daily.     . furosemide (LASIX) 20 MG tablet Take 1 tablet (20 mg total) by mouth daily. 30 tablet 12  . insulin aspart (NOVOLOG FLEXPEN) 100 UNIT/ML FlexPen Before each meal 3 times a day, 140-199 - 2 units, 200-250 - 4 units, 251-299 - 6 units,  300-349 - 8 units,  350 or above 10 units. Insulin PEN if approved, provide syringes and needles if needed. 15 mL 1  . losartan (COZAAR) 100 MG tablet Take 100 mg by mouth daily.    . metFORMIN (GLUCOPHAGE) 1000 MG tablet Take 1,000 mg by mouth 2 (two) times daily with a meal.      . nitroGLYCERIN (NITROSTAT) 0.4 MG SL tablet Place 1 tablet (0.4 mg total) under the tongue every 5 (five) minutes as needed for chest pain. 30 tablet 0  . pantoprazole (PROTONIX) 40 MG tablet Take 1 tablet (40 mg total) by mouth daily. 30 tablet 12  . Potassium Chloride ER 20 MEQ TBCR Take 20 mEq by mouth daily.     . Prenatal Vit-Fe Fumarate-FA (MULTIVITAMIN-PRENATAL) 27-0.8 MG TABS tablet Take 2 tablets by mouth daily at 12 noon.    . ticagrelor (BRILINTA) 90 MG TABS tablet Take 1 tablet (90 mg total) by mouth 2 (two) times  daily. 60 tablet 5  . valACYclovir (VALTREX) 500 MG tablet Take 500 mg by mouth 2 (two) times daily as needed. Takes for outbreaks only.    . metoprolol (LOPRESSOR) 50 MG tablet Take 1.5 tablet ( 75 mg) in the morning and 1 tablet in the PM (50 mg) 225 tablet 3   No facility-administered medications prior to visit.      Allergies:   Erythromycin; Lovastatin; and Tolnaftate   Social History   Social History  . Marital status: Married    Spouse name: N/A  . Number of children: N/A  . Years of  education: N/A   Social History Main Topics  . Smoking status: Former Smoker    Packs/day: 0.50    Years: 22.00    Types: Cigarettes    Quit date: 10/13/2005  . Smokeless tobacco: Never Used  . Alcohol use Yes     Comment: 10/15/2016 "nothing in the last 3 years"  . Drug use: No  . Sexual activity: Yes    Birth control/ protection: None   Other Topics Concern  . None   Social History Narrative  . None     Family History:  The patient's family history includes Cancer in her maternal aunt and maternal uncle.   ROS General: Positive for obesity;No fevers, chills, or night sweats;  HEENT: Negative; No changes in vision or hearing, sinus congestion, difficulty swallowing Pulmonary: Negative; No cough, wheezing, shortness of breath, hemoptysis Cardiovascular: Negative; No chest pain, presyncope, syncope, palpitations GI: Negative; No nausea, vomiting, diarrhea, or abdominal pain GU: Negative; No dysuria, hematuria, or difficulty voiding Musculoskeletal: Negative; no myalgias, joint pain, or weakness Hematologic/Oncology: Negative; no easy bruising, bleeding Endocrine: Positive for diabetes mellitus Neuro: Negative; no changes in balance, headaches Skin: Negative; No rashes or skin lesions Psychiatric: Negative; No behavioral problems, depression Sleep: Negative; No snoring, daytime sleepiness, hypersomnolence, bruxism, restless legs, hypnogognic hallucinations, no cataplexy Other  comprehensive 14 point system review is negative.   PHYSICAL EXAM:   VS:  BP 124/86   Pulse 93   Ht '5\' 8"'  (1.727 m)   Wt 260 lb (117.9 kg)   BMI 39.53 kg/m     Repeat blood pressure by me 140/78.  Wt Readings from Last 3 Encounters:  01/22/17 260 lb (117.9 kg)  12/29/16 258 lb (117 kg)  12/17/16 258 lb 11.2 oz (117.3 kg)    General: Alert, oriented, no distress.  Skin: normal turgor, no rashes, warm and dry HEENT: Normocephalic, atraumatic. Pupils equal round and reactive to light; sclera anicteric; extraocular muscles intact; Fundi Without hemorrhages or exudates Nose without nasal septal hypertrophy Mouth/Parynx benign; Mallinpatti scale 3 Neck: No JVD, no carotid bruits; normal carotid upstroke Lungs: clear to ausculatation and percussion; no wheezing or rales Chest wall: without tenderness to palpitation Heart: PMI not displaced, RRR  But heart rate is increase in the mid 90s., s1 s2 normal, 1/6 systolic murmur, no diastolic murmur, no rubs, gallops, thrills, or heaves Abdomen: soft, nontender; no hepatosplenomehaly, BS+; abdominal aorta nontender and not dilated by palpation. Back: no CVA tenderness Pulses 2+ Musculoskeletal: full range of motion, normal strength, no joint deformities Extremities: Swelling of the ankle and dorsum of her feet bilaterally;  clubbing cyanosis  Homan's sign negative  Neurologic: grossly nonfocal; Cranial nerves grossly wnl Psychologic: Normal mood and affect   Studies/Labs Reviewed:   ECG (independently read by me): Normal sinus rhythm at 87 bpm.  Nonspecific T changes.  October 2017 ECG (independently read by me): Sinus rhythm at 95 bpm with T-wave inversion in lead 3.  Recent Labs: BMP Latest Ref Rng & Units 12/29/2016 10/17/2016 10/16/2016  Glucose 70 - 140 mg/dl 263(H) 232(H) 266(H)  BUN 7.0 - 26.0 mg/dL 10.1 <5(L) 8  Creatinine 0.6 - 1.1 mg/dL 0.8 0.59 0.61  Sodium 136 - 145 mEq/L 143 140 135  Potassium 3.5 - 5.1 mEq/L 3.8 3.5 3.2(L)    Chloride 101 - 111 mmol/L - 109 103  CO2 22 - 29 mEq/L '27 24 25  ' Calcium 8.4 - 10.4 mg/dL 9.1 8.0(L) 8.4(L)     Hepatic Function Latest Ref Rng &  Units 12/29/2016 10/16/2016 10/15/2016  Total Protein 6.4 - 8.3 g/dL 6.6 5.6(L) 6.5  Albumin 3.5 - 5.0 g/dL 3.6 3.1(L) 3.7  AST 5 - 34 U/L '13 16 17  ' ALT 0 - 55 U/L 47 18 19  Alk Phosphatase 40 - 150 U/L 125 71 91  Total Bilirubin 0.20 - 1.20 mg/dL 0.59 0.4 0.5  Bilirubin, Direct 0.1 - 0.5 mg/dL - - <0.1(L)    CBC Latest Ref Rng & Units 12/29/2016 12/17/2016 10/17/2016  WBC 3.9 - 10.3 10e3/uL 11.2(H) 12.4(H) 8.5  Hemoglobin 11.6 - 15.9 g/dL 11.3(L) 9.7(L) 10.8(L)  Hematocrit 34.8 - 46.6 % 37.4 31.3(L) 34.2(L)  Platelets 145 - 400 10e3/uL 193 301 167   Lab Results  Component Value Date   MCV 73.9 (L) 12/29/2016   MCV 70.7 (L) 12/17/2016   MCV 72.8 (L) 10/17/2016   Lab Results  Component Value Date   TSH 2.266 04/09/2016   Lab Results  Component Value Date   HGBA1C 12.3 (H) 10/17/2016     BNP No results found for: BNP  ProBNP No results found for: PROBNP   Lipid Panel     Component Value Date/Time   CHOL 105 10/16/2016 0234   TRIG 130 10/16/2016 0234   HDL 27 (L) 10/16/2016 0234   CHOLHDL 3.9 10/16/2016 0234   VLDL 26 10/16/2016 0234   LDLCALC 52 10/16/2016 0234     RADIOLOGY: No results found.   Additional studies/ records that were reviewed today include:  I had reviewed her 42 through June 29 hospitalization and discharge summary; cardiac catheterization data, echo Doppler study, and PA follow-up office visits.  Laboratory from January 2018 was reviewed.   ASSESSMENT:    1. CAD in native artery   2. Essential hypertension   3. Fatigue, unspecified type   4. Palpitation   5. Tachycardia   6. Daytime somnolence   7. Gasping for breath   8. Type 2 diabetes mellitus with complication, with long-term current use of insulin ([place])   9. Hyperlipidemia with target LDL less than 70   10. Morbid obesity [facility])       PLAN:  Ms. Desiree [name] is a 65 -year-old obese African-American female who suffered a non-ST segment elevation myocardial infarction on 04/08/2016 and on 04/09/2016 underwent successful DES stenting to a high-grade mid LAD stenosis.  She has mild concomitant CAD in her RCA.  She has normal LV function and did not sustain significant damage or wall motion abnormality.  She has not had any recurrent anginal symptoms. When I last saw her, I did not feel she was well-padded block.  On her slight increased metoprolol dosing, she continues to have a resting heart rate in the 90s today.  She also has been awakened from sleep with episodes of tachycardia which may be associated with awakening gasping for breath.  I am recommending titration of metoprolol, tartrate up to 100 mg twice a day.  This should also improve her blood pressure which was mildly elevated today.  Based on new hypertensive guidelines.  She will continue taking losartan 100 mg and I suggested she change her furosemide timing and take this earlier in the day rather than before bedtime.  Her GERD is controlled with Protonix.  She is diabetic on insulin and metformin.  She continues to tolerate atorvastatin 80 mg for hyperlipidemia.  He continues to take fish oil.  Target LDL is less than 70.  With her cardiovascular comorbidities, near morbid obesity, and awakening possibly gasping for  breath associated with brief tachycardic episodes, I am recommending that she undergo a nonstick sleep study to evaluate for potential obstructive sleep apnea.  She tells me she will also be undergoing another ferritin infusion for her anemia.  I will see her in 4 months for reevaluation.   Time spent: 25 minutes  Medication Adjustments/Labs and Tests Ordered: Current medicines are reviewed at length with the patient today.  Concerns regarding medicines are outlined above.  Medication changes, Labs and Tests ordered today are listed in the Patient  Instructions below. Patient Instructions  Your physician has recommended you make the following change in your medication:   1.) the metoprolol ( lopressor) has been increased to 100 mg twice a day. A new prescription has been sent to your pharmacy to reflect this change.  Your physician has recommended that you have a sleep study. This test records several body functions during sleep, including: brain activity, eye movement, oxygen and carbon dioxide blood levels, heart rate and rhythm, breathing rate and rhythm, the flow of air through your mouth and nose, snoring, body muscle movements, and chest and belly movement. This will be scheduled at [facility].  Your physician recommends that you schedule a follow-up appointment in: 4 months with Dr Claiborne Billings.         Signed, Shelva Majestic, MD, [facility]  01/22/2017 12:19 PM    [facility] [address] [place], [place], [place]  [address] Phone: [phone]

## 2017-01-23 ENCOUNTER — Ambulatory Visit (HOSPITAL_BASED_OUTPATIENT_CLINIC_OR_DEPARTMENT_OTHER): Payer: BLUE CROSS/BLUE SHIELD | Admitting: Hematology and Oncology

## 2017-01-23 ENCOUNTER — Ambulatory Visit (HOSPITAL_BASED_OUTPATIENT_CLINIC_OR_DEPARTMENT_OTHER): Payer: BLUE CROSS/BLUE SHIELD

## 2017-01-23 ENCOUNTER — Other Ambulatory Visit (HOSPITAL_BASED_OUTPATIENT_CLINIC_OR_DEPARTMENT_OTHER): Payer: BLUE CROSS/BLUE SHIELD

## 2017-01-23 ENCOUNTER — Telehealth: Payer: Self-pay | Admitting: Hematology and Oncology

## 2017-01-23 ENCOUNTER — Encounter: Payer: Self-pay | Admitting: Hematology and Oncology

## 2017-01-23 VITALS — BP 123/74 | HR 84 | Temp 98.1°F | Resp 18 | Ht 68.0 in | Wt 260.5 lb

## 2017-01-23 VITALS — BP 122/71 | HR 87 | Temp 98.1°F | Resp 18

## 2017-01-23 DIAGNOSIS — K909 Intestinal malabsorption, unspecified: Secondary | ICD-10-CM

## 2017-01-23 DIAGNOSIS — D500 Iron deficiency anemia secondary to blood loss (chronic): Secondary | ICD-10-CM | POA: Diagnosis not present

## 2017-01-23 DIAGNOSIS — D72829 Elevated white blood cell count, unspecified: Secondary | ICD-10-CM

## 2017-01-23 DIAGNOSIS — C8307 Small cell B-cell lymphoma, spleen: Secondary | ICD-10-CM

## 2017-01-23 LAB — CBC & DIFF AND RETIC
BASO%: 0.2 % (ref 0.0–2.0)
Basophils Absolute: 0 10*3/uL (ref 0.0–0.1)
EOS%: 0.9 % (ref 0.0–7.0)
Eosinophils Absolute: 0.1 10*3/uL (ref 0.0–0.5)
HCT: 37.6 % (ref 34.8–46.6)
HGB: 11.8 g/dL (ref 11.6–15.9)
Immature Retic Fract: 11.5 % — ABNORMAL HIGH (ref 1.60–10.00)
LYMPH#: 4.9 10*3/uL — AB (ref 0.9–3.3)
LYMPH%: 36.7 % (ref 14.0–49.7)
MCH: 23.5 pg — ABNORMAL LOW (ref 25.1–34.0)
MCHC: 31.4 g/dL — AB (ref 31.5–36.0)
MCV: 74.8 fL — ABNORMAL LOW (ref 79.5–101.0)
MONO#: 0.6 10*3/uL (ref 0.1–0.9)
MONO%: 4.1 % (ref 0.0–14.0)
NEUT%: 58.1 % (ref 38.4–76.8)
NEUTROS ABS: 7.7 10*3/uL — AB (ref 1.5–6.5)
Platelets: 216 10*3/uL (ref 145–400)
RBC: 5.03 10*6/uL (ref 3.70–5.45)
RDW: 17.8 % — ABNORMAL HIGH (ref 11.2–14.5)
RETIC %: 2.03 % (ref 0.70–2.10)
Retic Ct Abs: 102.11 10*3/uL — ABNORMAL HIGH (ref 33.70–90.70)
WBC: 13.3 10*3/uL — AB (ref 3.9–10.3)
nRBC: 0 % (ref 0–0)

## 2017-01-23 LAB — FERRITIN: Ferritin: 109 ng/ml (ref 9–269)

## 2017-01-23 MED ORDER — SODIUM CHLORIDE 0.9 % IV SOLN
510.0000 mg | Freq: Once | INTRAVENOUS | Status: AC
  Administered 2017-01-23: 510 mg via INTRAVENOUS
  Filled 2017-01-23: qty 17

## 2017-01-23 MED ORDER — SODIUM CHLORIDE 0.9 % IV SOLN
Freq: Once | INTRAVENOUS | Status: AC
  Administered 2017-01-23: 11:00:00 via INTRAVENOUS

## 2017-01-23 NOTE — Telephone Encounter (Signed)
Appointments scheduled per 4.13.18 LOS. Patient given AVS report and calendars with future scheduled appointments. °

## 2017-01-23 NOTE — Patient Instructions (Signed)

## 2017-01-23 NOTE — Assessment & Plan Note (Signed)
The most likely cause of her anemia is due to chronic blood loss/malabsorption syndrome. We discussed some of the risks, benefits, and alternatives of intravenous iron infusions. The patient is symptomatic from anemia and the iron level is critically low. She tolerated oral iron supplement poorly and desires to achieved higher levels of iron faster for adequate hematopoesis. Some of the side-effects to be expected including risks of infusion reactions, phlebitis, headaches, nausea and fatigue.  The patient is willing to proceed. Patient education material was dispensed.  Goal is to keep ferritin level greater than 100 Due to her recent cardiac condition and menorrhagia, I would not wait for iron studies to come back. The patient had microcytosis dated back to 10 years. I suspect she may have undiagnosed thalassemia.

## 2017-01-23 NOTE — Progress Notes (Signed)
[facility] OFF[facility] PROGRESS NOTE  Patient Care Team: Rogers Blocker, MD as PCP - General (Internal Medicine) Heath Lark, MD as Consulting Physician (Hematology and Oncology)  SUMMARY OF ONCOLOGIC HISTORY:   Splenic marginal zone b-cell lymphoma ([place])   01/11/2009 Initial Diagnosis    Splenic marginal zone b-cell lymphoma      06/06/2014 Bone Marrow Biopsy    Bone marrow aspirate and biopsy confirmed extensive involvement by CD20 positive non-Hodgkin's lymphoma.      06/07/2014 Imaging    She had a PET scan which showed predominant splenic involvement.      06/16/2014 - 07/07/2014 Chemotherapy    She started on weekly rituximab.      06/16/2014 Adverse Reaction    She had infusion reaction, resolved with IV dexamethasone.      08/18/2014 Imaging    PET CT scan show resolution of hypermetabolic activity.       INTERVAL HISTORY: Please see below for problem oriented charting. She returns for further follow-up Her fatigue has improved with iron treatment last month She denies chest pain No recent lymphadenopathy or infection  REVIEW OF SYSTEMS:   Constitutional: Denies fevers, chills or abnormal weight loss Eyes: Denies blurriness of vision Ears, nose, mouth, throat, and face: Denies mucositis or sore throat Respiratory: Denies cough, dyspnea or wheezes Cardiovascular: Denies palpitation, chest discomfort or lower extremity swelling Gastrointestinal:  Denies nausea, heartburn or change in bowel habits Skin: Denies abnormal skin rashes Lymphatics: Denies new lymphadenopathy or easy bruising Neurological:Denies numbness, tingling or new weaknesses Behavioral/Psych: Mood is stable, no new changes  All other systems were reviewed with the patient and are negative.  I have reviewed the past medical history, past surgical history, social history and family history with the patient and they are unchanged from previous note.  ALLERGIES:  is allergic to erythromycin;  lovastatin; and tolnaftate.  MEDICATIONS:  Current Outpatient Prescriptions  Medication Sig Dispense Refill  . acetaminophen (TYLENOL) 500 MG tablet Take 1 tablet (500 mg total) by mouth every 8 (eight) hours as needed (pain). 30 tablet 0  . aspirin 81 MG chewable tablet Chew 1 tablet (81 mg total) by mouth daily. 30 tablet 0  . atorvastatin (LIPITOR) 80 MG tablet Take 1 tablet (80 mg total) by mouth daily at 6 PM. 30 tablet 12  . fish oil-omega-3 fatty acids 1000 MG capsule Take 1 g by mouth 2 (two) times daily.     . furosemide (LASIX) 20 MG tablet Take 1 tablet (20 mg total) by mouth daily. 30 tablet 12  . insulin aspart (NOVOLOG FLEXPEN) 100 UNIT/ML FlexPen Before each meal 3 times a day, 140-199 - 2 units, 200-250 - 4 units, 251-299 - 6 units,  300-349 - 8 units,  350 or above 10 units. Insulin PEN if approved, provide syringes and needles if needed. 15 mL 1  . losartan (COZAAR) 100 MG tablet Take 100 mg by mouth daily.    . metFORMIN (GLUCOPHAGE) 1000 MG tablet Take 1,000 mg by mouth 2 (two) times daily with a meal.      . metoprolol (LOPRESSOR) 100 MG tablet Take 1 tablet (100 mg total) by mouth 2 (two) times daily. 60 tablet 11  . pantoprazole (PROTONIX) 40 MG tablet Take 1 tablet (40 mg total) by mouth daily. 30 tablet 12  . Prenatal Vit-Fe Fumarate-FA (MULTIVITAMIN-PRENATAL) 27-0.8 MG TABS tablet Take 2 tablets by mouth daily at 12 noon.    . ticagrelor (BRILINTA) 90 MG TABS tablet Take  1 tablet (90 mg total) by mouth 2 (two) times daily. 60 tablet 5  . nitroGLYCERIN (NITROSTAT) 0.4 MG SL tablet Place 1 tablet (0.4 mg total) under the tongue every 5 (five) minutes as needed for chest pain. (Patient not taking: Reported on 01/23/2017) 30 tablet 0  . Potassium Chloride ER 20 MEQ TBCR Take 20 mEq by mouth daily.     . valACYclovir (VALTREX) 500 MG tablet Take 500 mg by mouth 2 (two) times daily as needed. Takes for outbreaks only.     No current facility-administered medications for this  visit.     PHYSICAL EXAMINATION: ECOG PERFORMANCE STATUS: 0 - Asymptomatic  Vitals:   01/23/17 0930  BP: 123/74  Pulse: 84  Resp: 18  Temp: 98.1 F (36.7 C)   Filed Weights   01/23/17 0930  Weight: 260 lb 8 oz (118.2 kg)    GENERAL:alert, no distress and comfortable SKIN: skin color, texture, turgor are normal, no rashes or significant lesions EYES: normal, Conjunctiva are pink and non-injected, sclera clear OROPHARYNX:no exudate, no erythema and lips, buccal mucosa, and tongue normal  NECK: supple, thyroid normal size, non-tender, without nodularity LYMPH:  no palpable lymphadenopathy in the cervical, axillary or inguinal LUNGS: clear to auscultation and percussion with normal breathing effort HEART: regular rate & rhythm and no murmurs and no lower extremity edema ABDOMEN:abdomen soft, non-tender and normal bowel sounds Musculoskeletal:no cyanosis of digits and no clubbing  NEURO: alert & oriented x 3 with fluent speech, no focal motor/sensory deficits  LABORATORY DATA:  I have reviewed the data as listed    Component Value Date/Time   NA 143 12/29/2016 1029   K 3.8 12/29/2016 1029   CL 109 10/17/2016 0531   CL 106 11/19/2012 0837   CO2 27 12/29/2016 1029   GLUCOSE 263 (H) 12/29/2016 1029   GLUCOSE 107 (H) 11/19/2012 0837   BUN 10.1 12/29/2016 1029   CREATININE 0.8 12/29/2016 1029   CALCIUM 9.1 12/29/2016 1029   PROT 6.6 12/29/2016 1029   ALBUMIN 3.6 12/29/2016 1029   AST 13 12/29/2016 1029   ALT 47 12/29/2016 1029   ALKPHOS 125 12/29/2016 1029   BILITOT 0.59 12/29/2016 1029   GFRNONAA >60 10/17/2016 0531   GFRAA >60 10/17/2016 0531    No results found for: SPEP, UPEP  Lab Results  Component Value Date   WBC 13.3 (H) 01/23/2017   NEUTROABS 7.7 (H) 01/23/2017   HGB 11.8 01/23/2017   HCT 37.6 01/23/2017   MCV 74.8 (L) 01/23/2017   PLT 216 01/23/2017      Chemistry      Component Value Date/Time   NA 143 12/29/2016 1029   K 3.8 12/29/2016 1029    CL 109 10/17/2016 0531   CL 106 11/19/2012 0837   CO2 27 12/29/2016 1029   BUN 10.1 12/29/2016 1029   CREATININE 0.8 12/29/2016 1029      Component Value Date/Time   CALCIUM 9.1 12/29/2016 1029   ALKPHOS 125 12/29/2016 1029   AST 13 12/29/2016 1029   ALT 47 12/29/2016 1029   BILITOT 0.59 12/29/2016 1029      ASSESSMENT & PLAN:  Splenic marginal zone b-cell lymphoma (HCC) She has improvement of her blood counts recently. I suspect the cause of her anemia is due to iron deficiency, however, recurrence of cancer cannot be excluded Her last CT imaging in July 2017 showed abnormal lung nodules I plan to repeat imaging study again in 3 months to exclude recurrence of lymphoma  Iron deficiency anemia due to chronic blood loss The most likely cause of her anemia is due to chronic blood loss/malabsorption syndrome. We discussed some of the risks, benefits, and alternatives of intravenous iron infusions. The patient is symptomatic from anemia and the iron level is critically low. She tolerated oral iron supplement poorly and desires to achieved higher levels of iron faster for adequate hematopoesis. Some of the side-effects to be expected including risks of infusion reactions, phlebitis, headaches, nausea and fatigue.  The patient is willing to proceed. Patient education material was dispensed.  Goal is to keep ferritin level greater than 100 Due to her recent cardiac condition and menorrhagia, I would not wait for iron studies to come back. The patient had microcytosis dated back to 10 years. I suspect she may have undiagnosed thalassemia.  Leukocytosis She has mild persistent leukocytosis of unknown reason She does not have signs of infection. I recommend observe only    Orders Placed This Encounter  Procedures  . CT ABDOMEN PELVIS W CONTRAST    Standing Status:   Future    Standing Expiration Date:   04/25/2018    Order Specific Question:   Reason for Exam (SYMPTOM  OR DIAGNOSIS  REQUIRED)    Answer:   staging lymphoma    Order Specific Question:   Is the patient pregnant?    Answer:   No    Order Specific Question:   Preferred imaging location?    Answer:   [facility]  . CT CHEST W CONTRAST    Standing Status:   Future    Standing Expiration Date:   04/25/2018    Order Specific Question:   Reason for Exam (SYMPTOM  OR DIAGNOSIS REQUIRED)    Answer:   staging lymphoma    Order Specific Question:   Is the patient pregnant?    Answer:   No    Order Specific Question:   Preferred imaging location?    Answer:   [facility]  . CBC & Diff and Retic    Standing Status:   Future    Standing Expiration Date:   04/25/2018  . Comprehensive metabolic panel    Standing Status:   Future    Standing Expiration Date:   04/25/2018  . Ferritin    Standing Status:   Future    Standing Expiration Date:   04/25/2018  . Lactate dehydrogenase    Standing Status:   Future    Standing Expiration Date:   04/25/2018   All questions were answered. The patient knows to call the clinic with any problems, questions or concerns. No barriers to learning was detected. I spent 15 minutes counseling the patient face to face. The total time spent in the appointment was 20 minutes and more than 50% was on counseling and review of test results     Heath Lark, MD 01/23/2017 12:04 PM

## 2017-01-23 NOTE — Assessment & Plan Note (Signed)
She has mild persistent leukocytosis of unknown reason She does not have signs of infection. I recommend observe only

## 2017-01-23 NOTE — Assessment & Plan Note (Signed)
She has improvement of her blood counts recently. I suspect the cause of her anemia is due to iron deficiency, however, recurrence of cancer cannot be excluded Her last CT imaging in July 2017 showed abnormal lung nodules I plan to repeat imaging study again in 3 months to exclude recurrence of lymphoma

## 2017-01-26 LAB — HEMOGLOBINOPATHY EVALUATION
HEMOGLOBIN A2 QUANTITATION: 2.1 % (ref 1.8–3.2)
HGB A: 97.9 % (ref 96.4–98.8)
HGB C: 0 %
HGB S: 0 %
HGB VARIANT: 0 %
Hemoglobin F Quantitation: 0 % (ref 0.0–2.0)

## 2017-01-26 LAB — HAPTOGLOBIN: Haptoglobin: 210 mg/dL — ABNORMAL HIGH (ref 34–200)

## 2017-01-28 ENCOUNTER — Other Ambulatory Visit: Payer: Self-pay

## 2017-01-28 MED ORDER — TICAGRELOR 90 MG PO TABS: 90.0000 mg | ORAL_TABLET | Freq: Two times a day (BID) | ORAL | 1 refills | Status: DC

## 2017-01-28 NOTE — Telephone Encounter (Signed)
Rx(s) sent to pharmacy electronically.  

## 2017-02-10 ENCOUNTER — Other Ambulatory Visit: Payer: Self-pay | Admitting: *Deleted

## 2017-02-10 DIAGNOSIS — R5383 Other fatigue: Secondary | ICD-10-CM

## 2017-02-10 DIAGNOSIS — G478 Other sleep disorders: Secondary | ICD-10-CM

## 2017-02-10 DIAGNOSIS — I10 Essential (primary) hypertension: Secondary | ICD-10-CM

## 2017-02-18 ENCOUNTER — Telehealth: Payer: Self-pay | Admitting: *Deleted

## 2017-02-18 NOTE — Telephone Encounter (Signed)
-----   Message from Lillia Pauls sent at 02/18/2017  8:27 AM EDT ----- Regarding: RE: Denied In Lab [place],  Appeal Faxed.  Please cancel in lab study for tomorrow night and notify pt until appeal results are determined.  Dr. Claiborne Billings may also call [phone] to intiate appeal.   TWK#[id number]   Thanks  Charmaine ----- Message ----- From: Lauralee Evener, CMA Sent: 02/06/2017   4:28 PM To: Lillia Pauls Subject: RE: Denied In Lab                              Dr Claiborne Billings would like appeal. This patient has tachycardia events as well as CAD. Feels she needs in lab study. ----- Message ----- From: Lillia Pauls Sent: 02/04/2017   1:19 PM To: Lauralee Evener, CMA Subject: Denied In Lab                                  Desiree [name]  Pt doesn't meet criteria for in lab study per insurance.  Needs HST.  Thanks  Charmaine

## 2017-02-18 NOTE — Telephone Encounter (Signed)
Left message on cell voice mail the insurance denied in lab sleep study. Waiting on appeal before rescheduling. Meantime we have scheduled a home sleep study for July 2nd 1:00. Call if questions.

## 2017-02-19 ENCOUNTER — Encounter (HOSPITAL_BASED_OUTPATIENT_CLINIC_OR_DEPARTMENT_OTHER): Payer: BLUE CROSS/BLUE SHIELD

## 2017-02-28 IMAGING — DX DG CHEST 2V
2 series · 2 of 2 positions shown · non-contrast
Comparison: 08/18/2014 PET scan

CLINICAL DATA: Chest pain starting yesterday

EXAM:
CHEST  2 VIEW

[w chest pa]
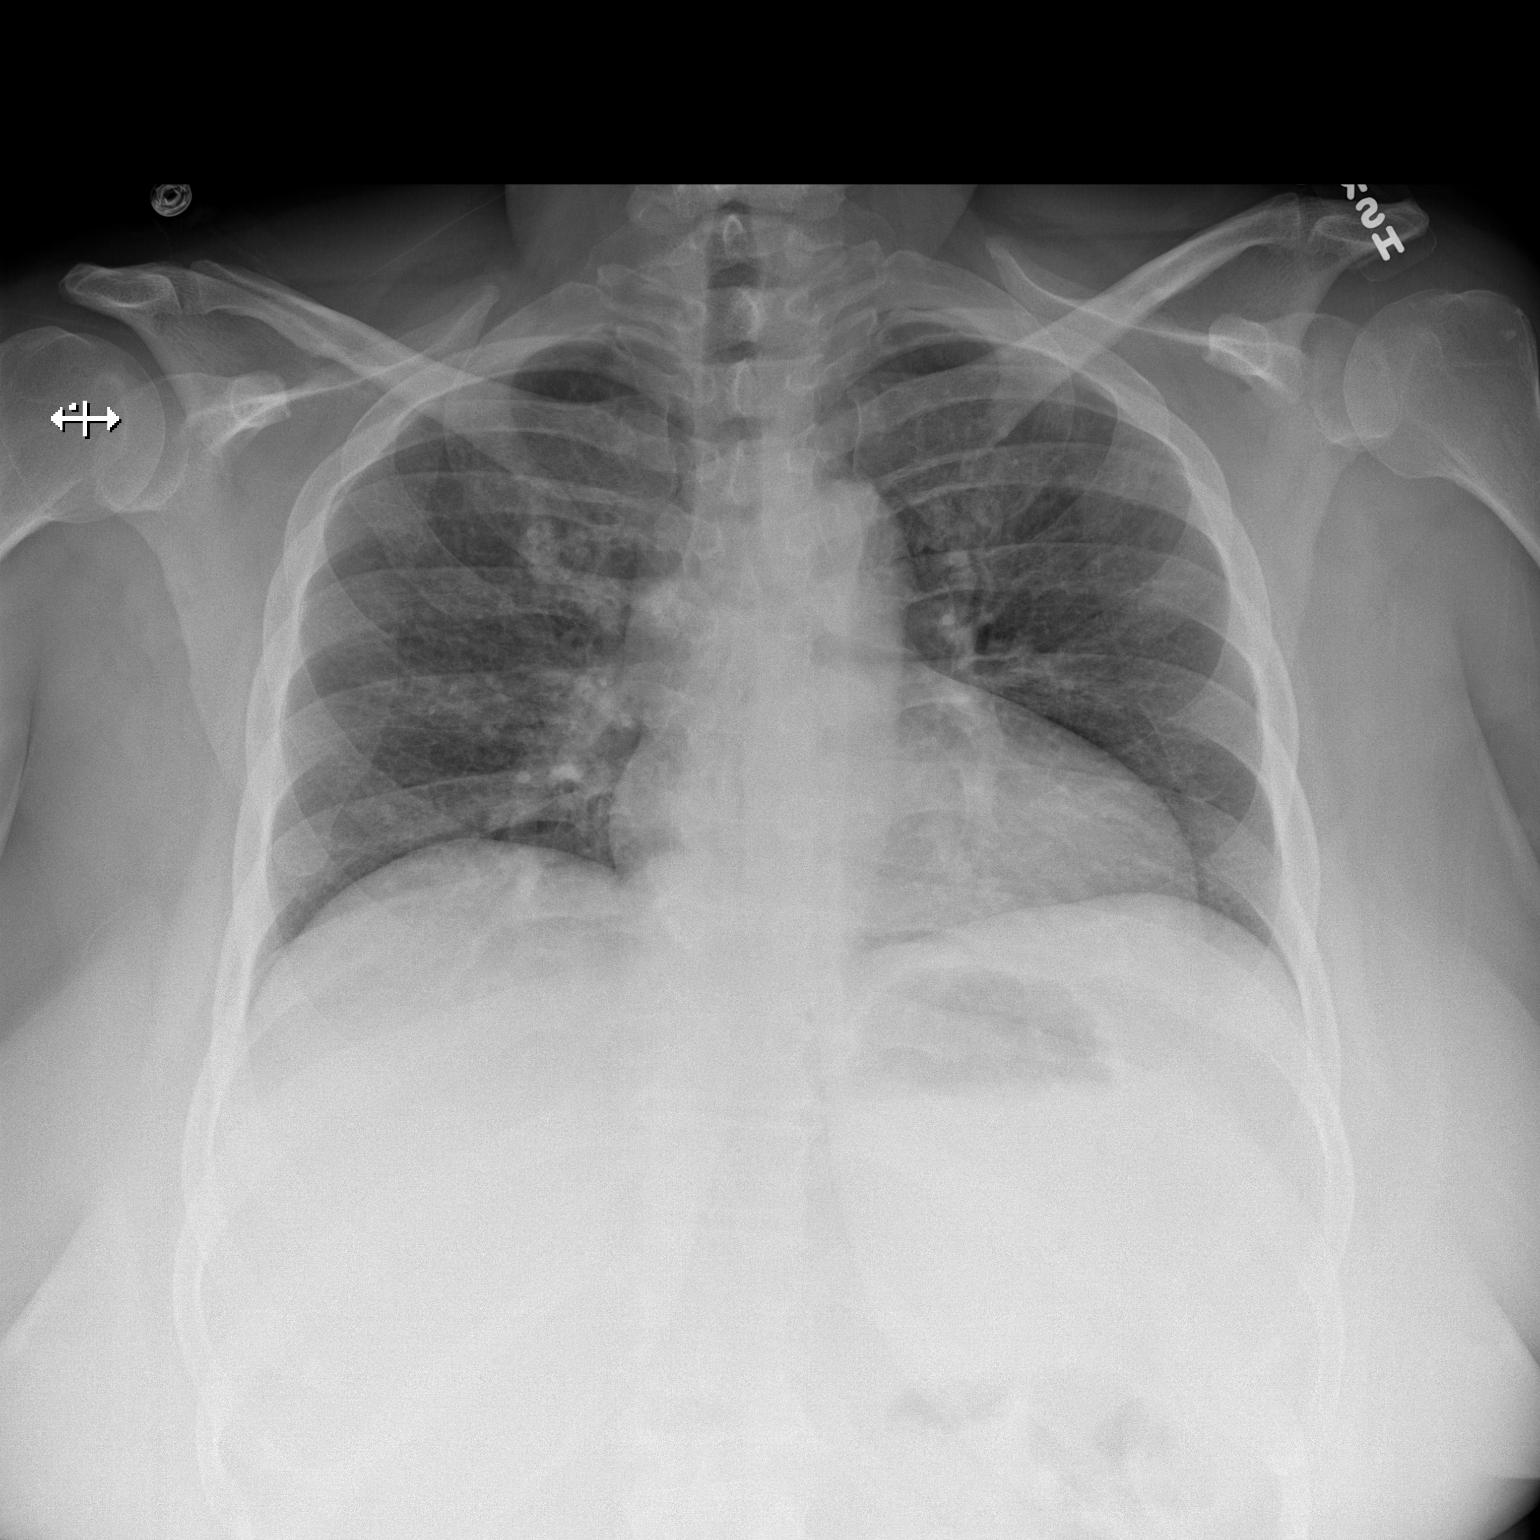

[w chest lat]
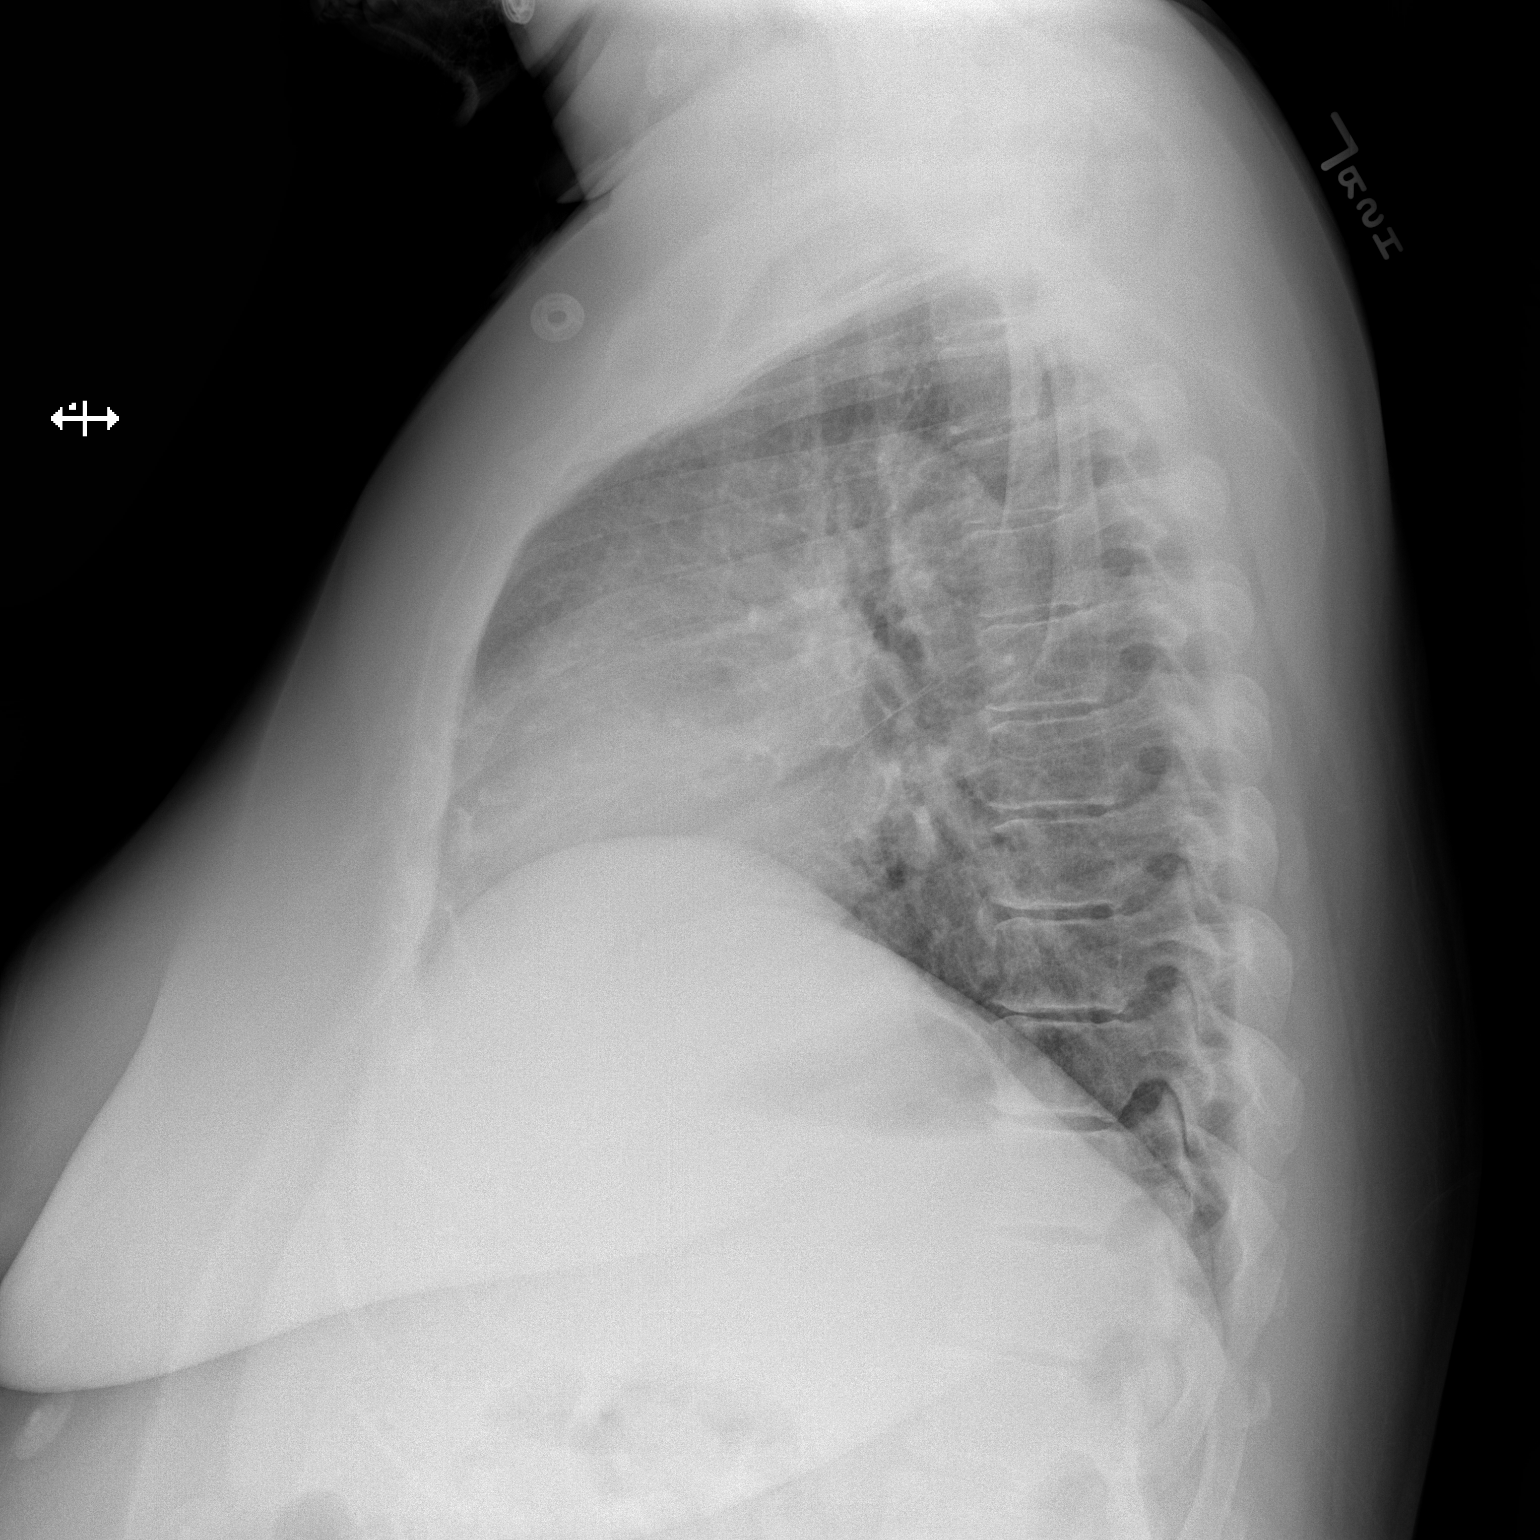

[2 of 2 positions shown; findings below may reference images not displayed]

FINDINGS: Cardiomediastinal silhouette is stable. Cardiomediastinal silhouette
is unremarkable. No acute infiltrate or pleural effusion. No
pulmonary edema. Bony thorax is unremarkable.
IMPRESSION: No active cardiopulmonary disease.

## 2017-02-28 IMAGING — CT CT ANGIO CHEST-ABD-PELV FOR DISSECTION W/ AND WO/W CM
2 of 7 series · 13 of 46 positions shown, 15 images · IV contrast (isovue)
Comparison: PET scan on 08/18/2014

CLINICAL DATA: New onset of chest pain beginning last evening with
progression since appearing pain radiates into her back. Associated
nausea. Pain is increased with exertion. Personal history of
lymphoma.

EXAM:
CT ANGIOGRAPHY CHEST, ABDOMEN AND PELVIS
TECHNIQUE: Multidetector CT imaging through the chest, abdomen and pelvis was
performed using the standard protocol during bolus administration of
intravenous contrast. Multiplanar reconstructed images and MIPs were
obtained and reviewed to evaluate the vascular anatomy.
CONTRAST:  100 mL Isovue 370

[Series 6: dissection 2mm · axial · 0.74mm/px · z∈[+1027,+1647]mm · 10 of 346 slices shown, 12 images]
[im 18/346  soft-tissue]
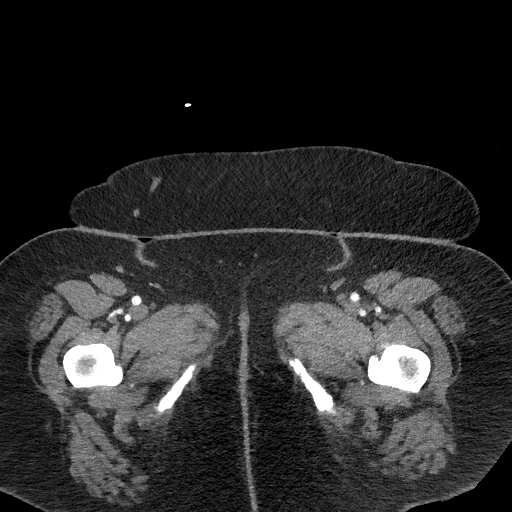
[im 18/346  bone]
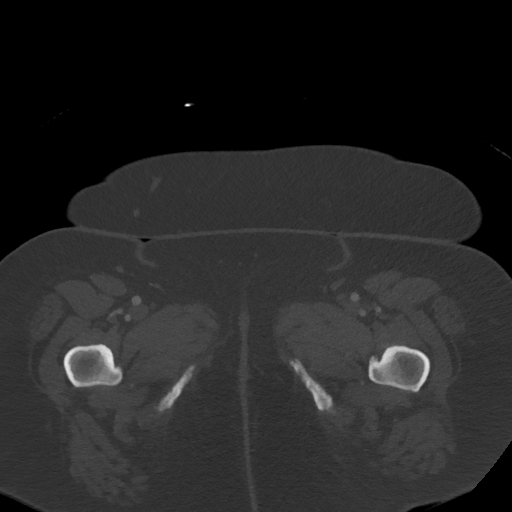
[im 52/346  soft-tissue]
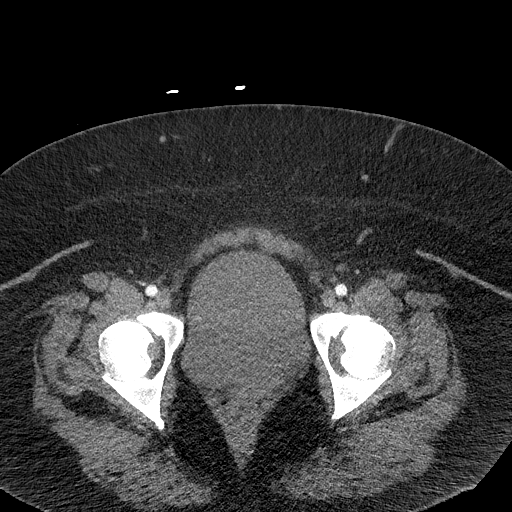
[im 87/346  soft-tissue]
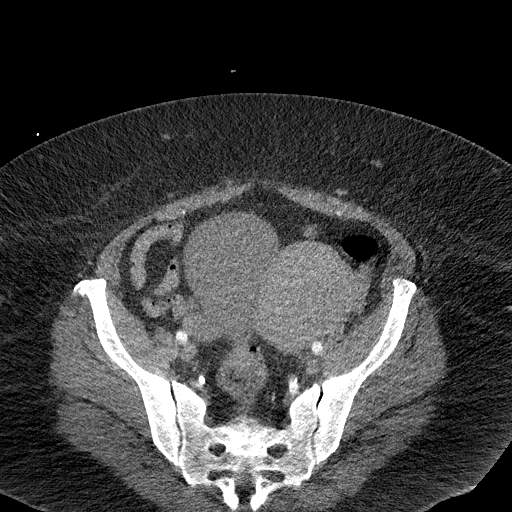
[im 121/346  soft-tissue]
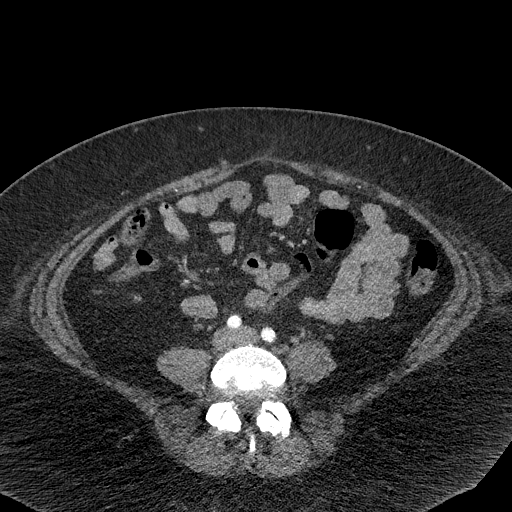
[im 156/346  soft-tissue]
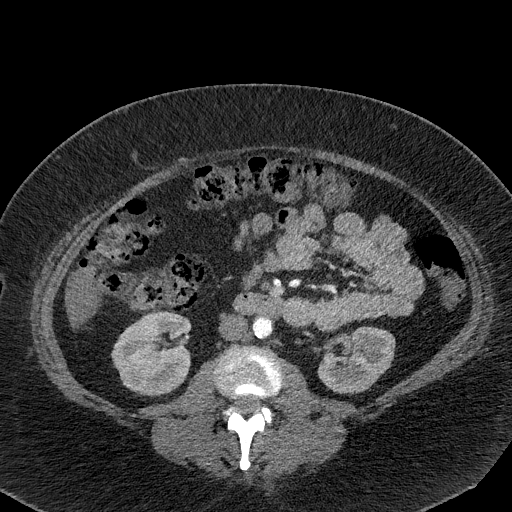
[im 190/346  soft-tissue]
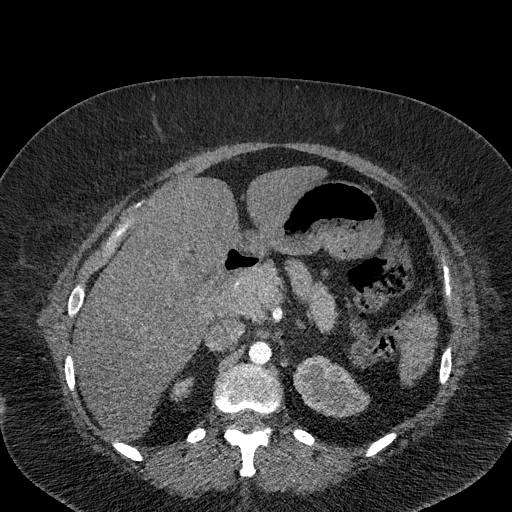
[im 225/346  soft-tissue]
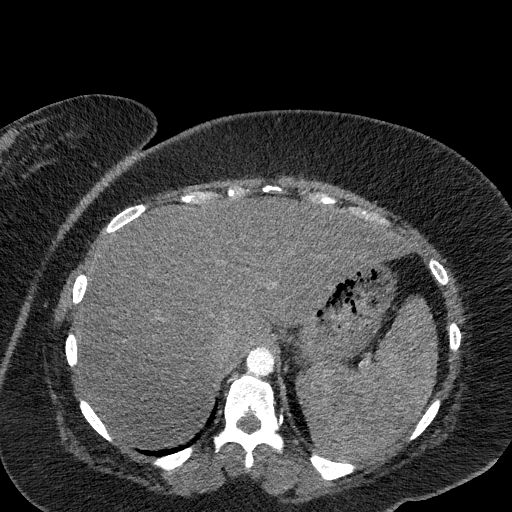
[im 259/346  soft-tissue]
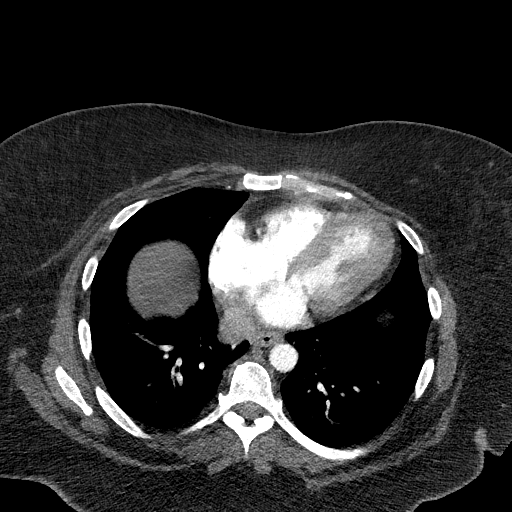
[im 294/346  soft-tissue]
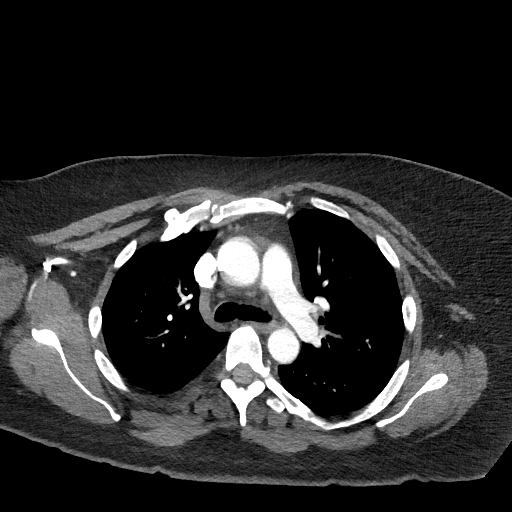
[im 294/346  bone]
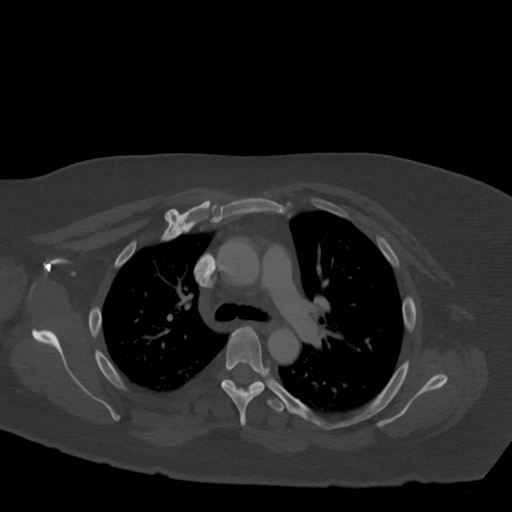
[im 328/346  soft-tissue]
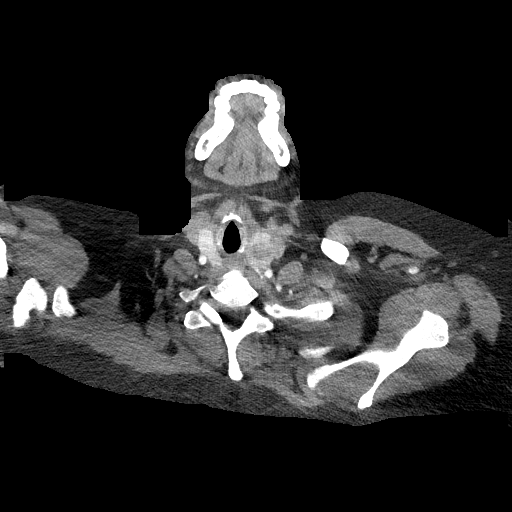

[Series 9: dissection 2mm cor · coronal · 0.82mm/px · 3 of 134 slices shown]
[im 34/134  soft-tissue]
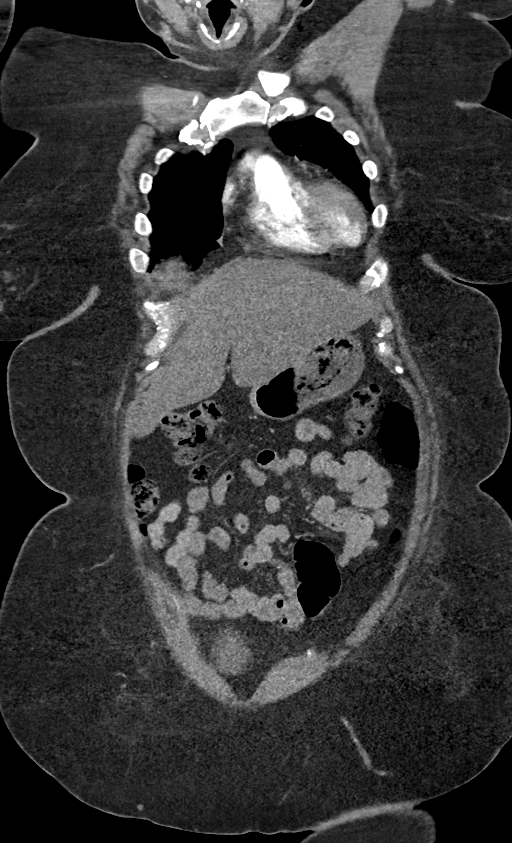
[im 67/134  soft-tissue]
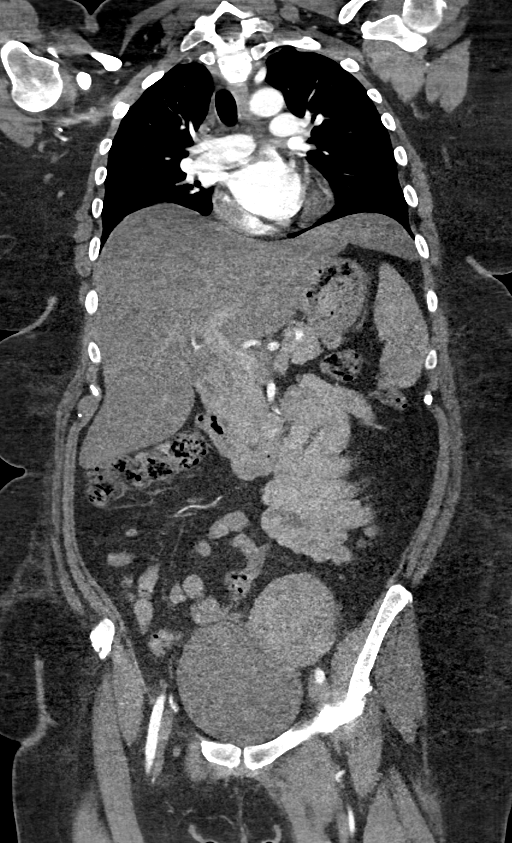
[im 100/134  soft-tissue]
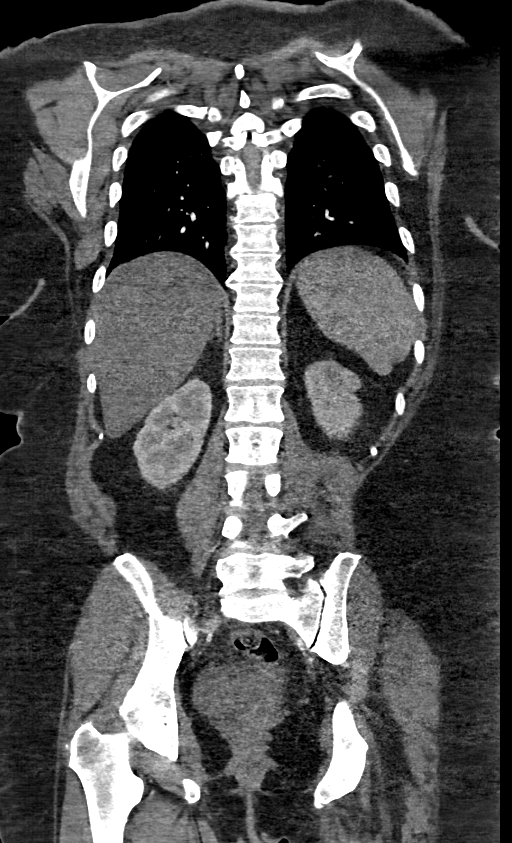

[13 of 46 positions shown; findings below may reference images not displayed]

FINDINGS: CTA CHEST FINDINGS

New the ascending aorta is within normal limits. There no focal
calcifications or intramural hemorrhage. The aortic arch is
unremarkable. A 3 vessel arch configuration is present. The
descending thoracic aorta is within normal limits. Pulmonary
arteries are normal. Heart size is normal. There is no significant
pericardial effusion.

No significant mediastinal or axillary adenopathy is present. The
thoracic inlet demonstrates stable appearance of left thyroid nodule
measuring 2.5 x 1.9 x 3.0 cm.

A 5 mm nodule at the right lung base has slightly increased in size.
No other focal nodule, mass, or airspace disease is present. Minimal
dependent atelectasis is present bilaterally.

Bone windows are unremarkable. Vertebral body heights alignment are
maintained. The ribs are within normal limits.

Review of the MIP images confirms the above findings.

CTA ABDOMEN AND PELVIS FINDINGS

The abdominal aorta is within normal limits. Minimal calcifications
are present. There is no aneurysm or evidence for dissection. The
celiac artery and superior mesenteric artery are within normal
limits. The inferior mesenteric artery is identified and normal.
Both renal arteries are within normal limits. The aortic bifurcation
is unremarkable there are calcifications are present within the
internal iliac arteries bilaterally.

The liver is unremarkable. The common bile duct and gallbladder are
normal. The spleen is mildly enlarged without significant interval
change. The pancreas is within normal limits. The adrenal glands and
kidneys are normal bilaterally. The ureters and urinary bladder are
within normal limits. Urinary bladder is mildly distended.

The stomach and duodenum are within normal limits. Small bowel is
unremarkable. The appendix is visualized and normal. The ascending
and transverse colon are within normal limits. The descending and
rectosigmoid colon are unremarkable.

The uterus is mildly enlarged, without significant interval change.
The adnexa are within normal limits bilaterally. No significant
adenopathy is present.

The axial skeleton is within normal limits. Pelvis is intact.
Degenerative changes are noted at the SI joints bilaterally.

Review of the MIP images confirms the above findings.
IMPRESSION: 1. No evidence for aortic dissection or vascular injury.
2. Mild atherosclerotic changes within the abdominal aorta and
branch vessels without aneurysm.
3. No significant adenopathy or evidence for recurrent lymphoma.
4. Stable mild splenomegaly.
5. 5 mm nodule in the right lower lobe demonstrates interval
increase in size. No follow-up needed if patient is low-risk.
Non-contrast chest CT can be considered in 12 months if patient is
high-risk. This recommendation follows the consensus statement:
Guidelines for Management of Incidental Pulmonary Nodules Detected
on CT Images:From the [HOSPITAL] 2336; published online
before print (10.1148/radiol.2448424291).
6. Degenerate changes at the SI joints bilaterally.

## 2017-03-10 ENCOUNTER — Other Ambulatory Visit: Payer: Self-pay | Admitting: *Deleted

## 2017-03-10 MED ORDER — TICAGRELOR 90 MG PO TABS: 90.0000 mg | ORAL_TABLET | Freq: Two times a day (BID) | ORAL | 2 refills | Status: DC

## 2017-03-19 ENCOUNTER — Telehealth: Payer: Self-pay | Admitting: Cardiovascular Disease

## 2017-03-19 NOTE — Telephone Encounter (Signed)
LM on pt's w# to c/b to my direct line [phone].  Need to know if pt received and mailed back letter of consent for our office to act on pt's behalf to file appeal for auth for in lab sleep study.  Ins appeals dept mailed consent form to pt's address on file 02-24-17.  We are unable to move forward w/o pt's signed consent form.

## 2017-03-31 ENCOUNTER — Other Ambulatory Visit: Payer: Self-pay | Admitting: *Deleted

## 2017-03-31 MED ORDER — METOPROLOL TARTRATE 100 MG PO TABS: 100.0000 mg | ORAL_TABLET | Freq: Two times a day (BID) | ORAL | 2 refills | Status: DC

## 2017-04-09 ENCOUNTER — Telehealth: Payer: Self-pay

## 2017-04-09 NOTE — Telephone Encounter (Signed)
Called patient per instructions from Dr. Alvy Bimler. Informed patient she will not have a PET scan prior to seeing Dr. Alvy Bimler. Informed patient that her upcoming appt are changing with Labs on ly on 7-12 and 7-13 she will see Dr. Alvy Bimler at [phone] then have IV iron in the infusion room.

## 2017-04-13 ENCOUNTER — Ambulatory Visit (HOSPITAL_BASED_OUTPATIENT_CLINIC_OR_DEPARTMENT_OTHER): Payer: BLUE CROSS/BLUE SHIELD

## 2017-04-23 ENCOUNTER — Telehealth: Payer: Self-pay | Admitting: *Deleted

## 2017-04-23 ENCOUNTER — Other Ambulatory Visit: Payer: Self-pay | Admitting: Hematology and Oncology

## 2017-04-23 ENCOUNTER — Other Ambulatory Visit (HOSPITAL_BASED_OUTPATIENT_CLINIC_OR_DEPARTMENT_OTHER): Payer: BLUE CROSS/BLUE SHIELD

## 2017-04-23 ENCOUNTER — Ambulatory Visit: Payer: BLUE CROSS/BLUE SHIELD

## 2017-04-23 DIAGNOSIS — D500 Iron deficiency anemia secondary to blood loss (chronic): Secondary | ICD-10-CM

## 2017-04-23 DIAGNOSIS — C8307 Small cell B-cell lymphoma, spleen: Secondary | ICD-10-CM | POA: Diagnosis not present

## 2017-04-23 LAB — TECHNOLOGIST REVIEW

## 2017-04-23 LAB — CBC & DIFF AND RETIC
BASO%: 0.3 % (ref 0.0–2.0)
BASOS ABS: 0 10*3/uL (ref 0.0–0.1)
EOS%: 1.1 % (ref 0.0–7.0)
Eosinophils Absolute: 0.1 10*3/uL (ref 0.0–0.5)
HEMATOCRIT: 41.1 % (ref 34.8–46.6)
HGB: 13.6 g/dL (ref 11.6–15.9)
IMMATURE RETIC FRACT: 8.9 % (ref 1.60–10.00)
LYMPH#: 6 10*3/uL — AB (ref 0.9–3.3)
LYMPH%: 46.2 % (ref 14.0–49.7)
MCH: 25.9 pg (ref 25.1–34.0)
MCHC: 33.1 g/dL (ref 31.5–36.0)
MCV: 78.1 fL — ABNORMAL LOW (ref 79.5–101.0)
MONO#: 0.8 10*3/uL (ref 0.1–0.9)
MONO%: 6.2 % (ref 0.0–14.0)
NEUT#: 6 10*3/uL (ref 1.5–6.5)
NEUT%: 46.2 % (ref 38.4–76.8)
PLATELETS: 188 10*3/uL (ref 145–400)
RBC: 5.26 10*6/uL (ref 3.70–5.45)
RDW: 15 % — ABNORMAL HIGH (ref 11.2–14.5)
RETIC %: 1.8 % (ref 0.70–2.10)
RETIC CT ABS: 94.68 10*3/uL — AB (ref 33.70–90.70)
WBC: 13 10*3/uL — ABNORMAL HIGH (ref 3.9–10.3)

## 2017-04-23 LAB — COMPREHENSIVE METABOLIC PANEL
ALT: 26 U/L (ref 0–55)
AST: 13 U/L (ref 5–34)
Albumin: 3.7 g/dL (ref 3.5–5.0)
Alkaline Phosphatase: 119 U/L (ref 40–150)
Anion Gap: 13 mEq/L — ABNORMAL HIGH (ref 3–11)
BUN: 13 mg/dL (ref 7.0–26.0)
CHLORIDE: 99 meq/L (ref 98–109)
CO2: 25 meq/L (ref 22–29)
CREATININE: 0.8 mg/dL (ref 0.6–1.1)
Calcium: 9.7 mg/dL (ref 8.4–10.4)
EGFR: >90 mL/min/{1.73_m2} (ref >90)
Glucose: 331 mg/dl — ABNORMAL HIGH (ref 70–140)
POTASSIUM: 4.5 meq/L (ref 3.5–5.1)
Sodium: 137 mEq/L (ref 136–145)
Total Bilirubin: 0.8 mg/dL (ref 0.20–1.20)
Total Protein: 6.9 g/dL (ref 6.4–8.3)

## 2017-04-23 LAB — LACTATE DEHYDROGENASE: LDH: 219 U/L (ref 125–245)

## 2017-04-23 LAB — FERRITIN: Ferritin: 132 ng/ml (ref 9–269)

## 2017-04-23 NOTE — Telephone Encounter (Signed)
Notified of message below. OK to reschedule in 3 months.

## 2017-04-23 NOTE — Telephone Encounter (Signed)
-----   Message from Heath Lark, MD sent at 04/23/2017 10:53 AM EDT ----- Regarding: call patient Her labs are better No need IV iron  OK to cancel IV iron Her insurance has denied CT If she feels OK, I do not feel strongly she needs to be seen tomorrow We can reschedule labs and appt to see me in 3 months Let me know and I will place orders and scheduling msg ----- Message ----- From: Interface, Lab In Three Zero One Sent: 04/23/2017   8:42 AM To: Heath Lark, MD

## 2017-04-24 ENCOUNTER — Other Ambulatory Visit: Payer: Self-pay | Admitting: Internal Medicine

## 2017-04-24 ENCOUNTER — Telehealth: Payer: Self-pay | Admitting: Hematology and Oncology

## 2017-04-24 ENCOUNTER — Ambulatory Visit: Payer: BLUE CROSS/BLUE SHIELD | Admitting: Hematology and Oncology

## 2017-04-24 ENCOUNTER — Ambulatory Visit: Payer: BLUE CROSS/BLUE SHIELD

## 2017-04-24 DIAGNOSIS — Z1231 Encounter for screening mammogram for malignant neoplasm of breast: Secondary | ICD-10-CM

## 2017-04-24 NOTE — Telephone Encounter (Signed)
Scheduled appt per sch message from 7/12 - left message with appt date and time and sent reminder letter in the mail.

## 2017-04-28 ENCOUNTER — Encounter: Payer: Self-pay | Admitting: Radiology

## 2017-04-28 ENCOUNTER — Ambulatory Visit
Admission: RE | Admit: 2017-04-28 | Discharge: 2017-04-28 | Disposition: A | Discharge status: Home / Self Care | Payer: BLUE CROSS/BLUE SHIELD | Source: Ambulatory Visit | Attending: Internal Medicine | Admitting: Internal Medicine

## 2017-04-28 DIAGNOSIS — Z1231 Encounter for screening mammogram for malignant neoplasm of breast: Secondary | ICD-10-CM

## 2017-05-06 ENCOUNTER — Ambulatory Visit (HOSPITAL_BASED_OUTPATIENT_CLINIC_OR_DEPARTMENT_OTHER): Payer: BLUE CROSS/BLUE SHIELD | Attending: Cardiovascular Disease | Admitting: Cardiovascular Disease

## 2017-05-06 VITALS — Ht 69.0 in | Wt 264.0 lb

## 2017-05-06 DIAGNOSIS — R400 Somnolence: Secondary | ICD-10-CM | POA: Diagnosis not present

## 2017-05-06 DIAGNOSIS — R0902 Hypoxemia: Secondary | ICD-10-CM | POA: Insufficient documentation

## 2017-05-06 DIAGNOSIS — G478 Other sleep disorders: Secondary | ICD-10-CM

## 2017-05-06 DIAGNOSIS — I2510 Atherosclerotic heart disease of native coronary artery without angina pectoris: Secondary | ICD-10-CM | POA: Insufficient documentation

## 2017-05-06 DIAGNOSIS — G4733 Obstructive sleep apnea (adult) (pediatric): Secondary | ICD-10-CM | POA: Diagnosis not present

## 2017-05-06 DIAGNOSIS — R0602 Shortness of breath: Secondary | ICD-10-CM | POA: Insufficient documentation

## 2017-05-06 DIAGNOSIS — I10 Essential (primary) hypertension: Secondary | ICD-10-CM | POA: Diagnosis not present

## 2017-05-06 DIAGNOSIS — R5383 Other fatigue: Secondary | ICD-10-CM | POA: Insufficient documentation

## 2017-05-06 DIAGNOSIS — R000 Tachycardia, unspecified: Secondary | ICD-10-CM | POA: Insufficient documentation

## 2017-05-06 DIAGNOSIS — R002 Palpitations: Secondary | ICD-10-CM | POA: Insufficient documentation

## 2017-05-12 ENCOUNTER — Telehealth: Payer: Self-pay | Admitting: Cardiovascular Disease

## 2017-05-12 NOTE — Telephone Encounter (Signed)
° °  [facility] Pre-operative Risk Assessment    Request for surgical clearance:  1. What type of surgery is being performed? Tooth Extraction*   2. When is this surgery scheduled? Have not schedule    3. Are there any medications that need to be held prior to surgery and how long?Waants to know if pt can stop her Brilinta?If so,how many days before extraction  And how many days after?  4. Name of physician performing surgery?Dr Nicholaus Corolla   5. What is your office phone and fax number? [phone] and fax is [phone]   Desiree [name] 05/12/2017, 2:45 PM  _________________________________________________________________   (provider comments below)

## 2017-05-12 NOTE — Telephone Encounter (Signed)
Will forward to dr Claiborne Billings to review and advise

## 2017-05-18 NOTE — Telephone Encounter (Signed)
Patient had STEMI in June 2017.  Okay to have tooth extraction but should hold Brilinta  for 5 days and resume once stable after the extraction in 1-2 days as long as no bleeding

## 2017-05-19 NOTE — Telephone Encounter (Signed)
Sent via Epic

## 2017-06-05 NOTE — Procedures (Signed)
Patient Name: Desiree [name], Desiree [name] Date: 05/06/2017 Gender: Female D.O.B: 1966/03/10 Age (years): 86 Referring Provider: Shelva Majestic MD, ABSM Height (inches): 44 Interpreting Physician: Shelva Majestic MD, ABSM Weight (lbs): 264 RPSGT: Jacolyn Reedy BMI: 39 MRN: [id number] Neck Size: 15.00  CLINICAL INFORMATION Sleep Study Type: Home Sleep Test  Indication for sleep study: Fatigue  Epworth Sleepiness Score: 7  SLEEP STUDY TECHNIQUE A multi-channel overnight portable sleep study was performed. The channels recorded were: nasal airflow, thoracic respiratory movement, and oxygen saturation with a pulse oximetry. Snoring was also monitored.  MEDICATIONS acetaminophen (TYLENOL) 500 MG tablet    Take 1 tablet (500 mg total) by mouth every 8 (eight) hours as needed (pain).     aspirin 81 MG chewable tablet    Chew 1 tablet (81 mg total) by mouth daily.     atorvastatin (LIPITOR) 80 MG tablet    Take 1 tablet (80 mg total) by mouth daily at 6 PM.    fish oil-omega-3 fatty acids 1000 MG capsule    Take 1 g by mouth 2 (two) times daily.            furosemide (LASIX) 20 MG tablet    Take 1 tablet (20 mg total) by mouth daily.     insulin aspart (NOVOLOG FLEXPEN) 100 UNIT/ML FlexPen    Before each meal 3 times a day, 140-199 - 2 units, 200-250 - 4 units, 251-299 - 6 units,  300-349 - 8 units,  350 or above 10 units. Insulin PEN if approved, provide syringes and needles if needed.     losartan (COZAAR) 100 MG tablet    Take 100 mg by mouth daily.           metFORMIN (GLUCOPHAGE) 1000 MG tablet    Take 1,000 mg by mouth 2 (two) times daily with a meal.     nitroGLYCERIN (NITROSTAT) 0.4 MG SL tablet    Place 1 tablet (0.4 mg total) under the tongue every 5 (five) minutes as needed for chest pain.    pantoprazole (PROTONIX) 40 MG tablet    Take 1 tablet (40 mg total) by mouth daily.     Potassium Chloride ER 20 MEQ TBCR    Take 20 mEq by mouth daily.            Prenatal Vit-Fe  Fumarate-FA (MULTIVITAMIN-PRENATAL) 27-0.8 MG TABS tablet    Take 2 tablets by mouth daily at 12 noon.           ticagrelor (BRILINTA) 90 MG TABS tablet    Take 1 tablet (90 mg total) by mouth 2 (two) times daily.    valACYclovir (VALTREX) 500 MG tablet    Take 500 mg by mouth 2 (two) times daily as needed. Takes for outbreaks only.           metoprolol (LOPRESSOR) 50 MG tablet    Take 1.5 tablet ( 75 mg) in the morning and 1 tablet in the PM (50 mg)          Patient self administered medications include: N/A.  SLEEP ARCHITECTURE Patient was studied for 372.7 minutes. The sleep efficiency was 100.0 % and the patient was supine for 36.7%. The arousal index was 0.0 per hour.  RESPIRATORY PARAMETERS The overall AHI was 5.3 per hour, with a central apnea index of 0.0 per hour.  The AHI during supine sleep was 7.0 per hour.  The oxygen nadir was 85% during sleep.  CARDIAC DATA Mean heart rate during  sleep was 86.4 bpm.  IMPRESSIONS - Mild obstructive sleep apnea occurred during this study (AHI = 5.3/h). - No significant central sleep apnea occurred during this study (CAI = 0.0/h). - Moderate oxygen desaturation was noted during this study (Min O2 = 85%). - Patient snored 17.7% during the sleep.  DIAGNOSIS - Obstructive Sleep Apnea (327.23 [G47.33 ICD-10]) - Nocturnal Hypoxemia (327.26 [G47.36 ICD-10])  RECOMMENDATIONS - In this patient with significant cardiovascular comorbidities recommend therapeutic CPAP titration to determine optimal pressure required to alleviate sleep disordered breathing.   - Effort should be made to optimize nasal and oral pharyngeal patency. - The patient should be advised to avoid supine sleep; consider positional therapy. - If patient is against consideration of CPAP titration, consider alternatives to CPAP such as customized oral dental appliance. - Avoid alcohol, sedatives and other CNS depressants that may worsen sleep apnea and disrupt normal sleep  architecture. - Sleep hygiene should be reviewed to assess factors that may improve sleep quality. - Weight management and regular exercise should be initiated or continued. - Patient may benefit from in-lab study. - Recommend sleep clinic evaluation following initiation of CPAP therapy or to discuss alternatives to CPAP treatment.  [Electronically signed] 06/05/2017 01:57 PM  Shelva Majestic MD, [facility], [place], American Board of Sleep Medicine NPI: [id number] [place] PH: [phone]   FX: [phone] [place]

## 2017-06-10 ENCOUNTER — Telehealth: Payer: Self-pay | Admitting: *Deleted

## 2017-06-10 DIAGNOSIS — G4733 Obstructive sleep apnea (adult) (pediatric): Secondary | ICD-10-CM

## 2017-06-10 DIAGNOSIS — I2510 Atherosclerotic heart disease of native coronary artery without angina pectoris: Secondary | ICD-10-CM

## 2017-06-10 DIAGNOSIS — I10 Essential (primary) hypertension: Secondary | ICD-10-CM

## 2017-06-10 NOTE — Telephone Encounter (Signed)
lmtcb 8/29

## 2017-06-10 NOTE — Telephone Encounter (Signed)
-----   Message from Troy Sine, MD sent at 06/05/2017  2:03 PM EDT ----- Hildred Alamin, please notify the patient of the results of her sleep study.  She has mild sleep apnea.  Other significant cardiovascular comorbidities, ideal treatment.  His CPAP titration and this is recommended.  If she does not want to pursue this, alternatives such as a customized oral dental appliance can be considered, and I can see her in the office to discuss options as needed.

## 2017-06-10 NOTE — Telephone Encounter (Signed)
New message    Pt is returning call. She states that she is at work and would like a voicemail left.

## 2017-06-10 NOTE — Telephone Encounter (Signed)
Returned call and left message per patient request. Advised of results and request to call back to discuss CPAP titration vs. Oral appliance.

## 2017-06-10 NOTE — Telephone Encounter (Signed)
Left message to call back  

## 2017-06-18 NOTE — Telephone Encounter (Signed)
Left message to call back  

## 2017-07-02 NOTE — Telephone Encounter (Signed)
Left message to call back  

## 2017-07-24 ENCOUNTER — Telehealth: Payer: Self-pay

## 2017-07-24 ENCOUNTER — Other Ambulatory Visit: Payer: Self-pay | Admitting: Hematology and Oncology

## 2017-07-24 ENCOUNTER — Other Ambulatory Visit (HOSPITAL_BASED_OUTPATIENT_CLINIC_OR_DEPARTMENT_OTHER): Payer: BLUE CROSS/BLUE SHIELD

## 2017-07-24 DIAGNOSIS — C8307 Small cell B-cell lymphoma, spleen: Secondary | ICD-10-CM | POA: Diagnosis not present

## 2017-07-24 DIAGNOSIS — D500 Iron deficiency anemia secondary to blood loss (chronic): Secondary | ICD-10-CM

## 2017-07-24 LAB — COMPREHENSIVE METABOLIC PANEL
ALBUMIN: 3.4 g/dL — AB (ref 3.5–5.0)
ALK PHOS: 112 U/L (ref 40–150)
ALT: 21 U/L (ref 0–55)
ANION GAP: 11 meq/L (ref 3–11)
AST: 13 U/L (ref 5–34)
BILIRUBIN TOTAL: 0.55 mg/dL (ref 0.20–1.20)
BUN: 10 mg/dL (ref 7.0–26.0)
CALCIUM: 8.8 mg/dL (ref 8.4–10.4)
CO2: 26 mEq/L (ref 22–29)
Chloride: 102 mEq/L (ref 98–109)
Creatinine: 0.8 mg/dL (ref 0.6–1.1)
GLUCOSE: 362 mg/dL — AB (ref 70–140)
Potassium: 4.2 mEq/L (ref 3.5–5.1)
Sodium: 138 mEq/L (ref 136–145)
TOTAL PROTEIN: 6.3 g/dL — AB (ref 6.4–8.3)

## 2017-07-24 LAB — IRON AND TIBC
%SAT: 14 % — ABNORMAL LOW (ref 21–57)
IRON: 37 ug/dL — AB (ref 41–142)
TIBC: 268 ug/dL (ref 236–444)
UIBC: 231 ug/dL (ref 120–384)

## 2017-07-24 LAB — CBC WITH DIFFERENTIAL/PLATELET
BASO%: 0.4 % (ref 0.0–2.0)
BASOS ABS: 0.1 10*3/uL (ref 0.0–0.1)
EOS%: 1.2 % (ref 0.0–7.0)
Eosinophils Absolute: 0.2 10*3/uL (ref 0.0–0.5)
HEMATOCRIT: 38.2 % (ref 34.8–46.6)
HEMOGLOBIN: 12.4 g/dL (ref 11.6–15.9)
LYMPH#: 5.8 10*3/uL — AB (ref 0.9–3.3)
LYMPH%: 44.5 % (ref 14.0–49.7)
MCH: 25.3 pg (ref 25.1–34.0)
MCHC: 32.6 g/dL (ref 31.5–36.0)
MCV: 77.8 fL — AB (ref 79.5–101.0)
MONO#: 0.7 10*3/uL (ref 0.1–0.9)
MONO%: 5.1 % (ref 0.0–14.0)
NEUT%: 48.8 % (ref 38.4–76.8)
NEUTROS ABS: 6.4 10*3/uL (ref 1.5–6.5)
PLATELETS: 193 10*3/uL (ref 145–400)
RBC: 4.91 10*6/uL (ref 3.70–5.45)
RDW: 14.5 % (ref 11.2–14.5)
WBC: 13.1 10*3/uL — ABNORMAL HIGH (ref 3.9–10.3)

## 2017-07-24 LAB — LACTATE DEHYDROGENASE: LDH: 198 U/L (ref 125–245)

## 2017-07-24 LAB — FERRITIN: FERRITIN: 81 ng/mL (ref 9–269)

## 2017-07-24 NOTE — Telephone Encounter (Signed)
-----   Message from Heath Lark, MD sent at 07/24/2017 12:35 PM EDT ----- Regarding: keep appt next week for IV iron pls tell her labs suggest she still need IV iron next week Keep her appt ----- Message ----- From: Interface, Lab In Three Zero One Sent: 07/24/2017  11:17 AM To: Heath Lark, MD

## 2017-07-24 NOTE — Telephone Encounter (Signed)
Called and spoke to patient-aware of sleep study results and recommendations.  Patient request to call back next week to speak with me about scheduling.   Advised I will go ahead and get scheduled as we are scheduling out in November but will await call back next week to verify date, can change if needed.  Patient aware and verbalized understanding, will call back next week.   CPAP titration ordered.

## 2017-07-24 NOTE — Telephone Encounter (Signed)
Called with below message, verbalized understanding. 

## 2017-07-27 ENCOUNTER — Other Ambulatory Visit: Payer: Self-pay

## 2017-07-27 MED ORDER — FUROSEMIDE 20 MG PO TABS: 20.0000 mg | ORAL_TABLET | Freq: Every day | ORAL | 3 refills | Status: DC

## 2017-07-31 ENCOUNTER — Ambulatory Visit (HOSPITAL_BASED_OUTPATIENT_CLINIC_OR_DEPARTMENT_OTHER): Payer: BLUE CROSS/BLUE SHIELD | Admitting: Hematology and Oncology

## 2017-07-31 ENCOUNTER — Encounter: Payer: Self-pay | Admitting: Hematology and Oncology

## 2017-07-31 ENCOUNTER — Telehealth: Payer: Self-pay | Admitting: Hematology and Oncology

## 2017-07-31 ENCOUNTER — Ambulatory Visit (HOSPITAL_BASED_OUTPATIENT_CLINIC_OR_DEPARTMENT_OTHER): Payer: BLUE CROSS/BLUE SHIELD

## 2017-07-31 VITALS — BP 139/85 | HR 87 | Temp 97.5°F | Resp 18 | Ht 69.0 in | Wt 273.1 lb

## 2017-07-31 VITALS — BP 119/77 | HR 479 | Temp 97.9°F | Resp 18

## 2017-07-31 DIAGNOSIS — C8307 Small cell B-cell lymphoma, spleen: Secondary | ICD-10-CM | POA: Diagnosis not present

## 2017-07-31 DIAGNOSIS — D509 Iron deficiency anemia, unspecified: Secondary | ICD-10-CM

## 2017-07-31 DIAGNOSIS — K909 Intestinal malabsorption, unspecified: Secondary | ICD-10-CM

## 2017-07-31 DIAGNOSIS — R911 Solitary pulmonary nodule: Secondary | ICD-10-CM

## 2017-07-31 DIAGNOSIS — E1165 Type 2 diabetes mellitus with hyperglycemia: Secondary | ICD-10-CM

## 2017-07-31 DIAGNOSIS — R918 Other nonspecific abnormal finding of lung field: Secondary | ICD-10-CM

## 2017-07-31 DIAGNOSIS — D500 Iron deficiency anemia secondary to blood loss (chronic): Secondary | ICD-10-CM

## 2017-07-31 MED ORDER — SODIUM CHLORIDE 0.9 % IV SOLN
510.0000 mg | Freq: Once | INTRAVENOUS | Status: AC
  Administered 2017-07-31: 510 mg via INTRAVENOUS
  Filled 2017-07-31: qty 17

## 2017-07-31 NOTE — Assessment & Plan Note (Signed)
She has poorly controlled diabetes We discussed the importance of getting her diabetes under control given her coronary artery disease I recommend close follow-up with her endocrinologist for medication adjustment

## 2017-07-31 NOTE — Telephone Encounter (Signed)
Scheduled appt per 10/19 los - Gave patient AVS and calender per los.  

## 2017-07-31 NOTE — Assessment & Plan Note (Signed)
She has improvement of her blood counts recently. Her last CT imaging in July 2017 showed abnormal lung nodules I plan to repeat imaging study again to exclude recurrence of lymphoma

## 2017-07-31 NOTE — Assessment & Plan Note (Signed)
The most likely cause of her anemia is due to chronic blood loss/malabsorption syndrome. We discussed some of the risks, benefits, and alternatives of intravenous iron infusions. The patient is symptomatic from anemia and the iron level is critically low. She tolerated oral iron supplement poorly and desires to achieved higher levels of iron faster for adequate hematopoesis. Some of the side-effects to be expected including risks of infusion reactions, phlebitis, headaches, nausea and fatigue.  The patient is willing to proceed. Patient education material was dispensed.  Goal is to keep ferritin level greater than 100 and normalization of iron saturation We will give her 1 dose and recheck in her next visit

## 2017-07-31 NOTE — Patient Instructions (Signed)

## 2017-07-31 NOTE — Progress Notes (Signed)
[facility] OFFICE PROGRESS NOTE  Patient Care Team: Rogers Blocker, MD as PCP - General (Internal Medicine) Heath Lark, MD as Consulting Physician (Hematology and Oncology)  SUMMARY OF ONCOLOGIC HISTORY:   Splenic marginal zone b-cell lymphoma ([place])   01/11/2009 Initial Diagnosis    Splenic marginal zone b-cell lymphoma      06/06/2014 Bone Marrow Biopsy    Bone marrow aspirate and biopsy confirmed extensive involvement by CD20 positive non-Hodgkin's lymphoma.      06/07/2014 Imaging    She had a PET scan which showed predominant splenic involvement.      06/16/2014 - 07/07/2014 Chemotherapy    She started on weekly rituximab.      06/16/2014 Adverse Reaction    She had infusion reaction, resolved with IV dexamethasone.      08/18/2014 Imaging    PET CT scan show resolution of hypermetabolic activity.       INTERVAL HISTORY: Please see below for problem oriented charting. She returns for further follow-up She denies recent infection No recent chest pain or shortness of breath She bruises easily. The patient denies any recent signs or symptoms of bleeding such as spontaneous epistaxis, hematuria or hematochezia.  REVIEW OF SYSTEMS:   Constitutional: Denies fevers, chills or abnormal weight loss Eyes: Denies blurriness of vision Ears, nose, mouth, throat, and face: Denies mucositis or sore throat Respiratory: Denies cough, dyspnea or wheezes Cardiovascular: Denies palpitation, chest discomfort or lower extremity swelling Gastrointestinal:  Denies nausea, heartburn or change in bowel habits Skin: Denies abnormal skin rashes Lymphatics: Denies new lymphadenopathy  Neurological:Denies numbness, tingling or new weaknesses Behavioral/Psych: Mood is stable, no new changes  All other systems were reviewed with the patient and are negative.  I have reviewed the past medical history, past surgical history, social history and family history with the patient and they  are unchanged from previous note.  ALLERGIES:  is allergic to erythromycin; lovastatin; and tolnaftate.  MEDICATIONS:  Current Outpatient Prescriptions  Medication Sig Dispense Refill  . acetaminophen (TYLENOL) 500 MG tablet Take 1 tablet (500 mg total) by mouth every 8 (eight) hours as needed (pain). 30 tablet 0  . aspirin 81 MG chewable tablet Chew 1 tablet (81 mg total) by mouth daily. 30 tablet 0  . atorvastatin (LIPITOR) 80 MG tablet Take 1 tablet (80 mg total) by mouth daily at 6 PM. 30 tablet 12  . fish oil-omega-3 fatty acids 1000 MG capsule Take 1 g by mouth 2 (two) times daily.     . furosemide (LASIX) 20 MG tablet Take 1 tablet (20 mg total) by mouth daily. 30 tablet 3  . insulin aspart (NOVOLOG FLEXPEN) 100 UNIT/ML FlexPen Before each meal 3 times a day, 140-199 - 2 units, 200-250 - 4 units, 251-299 - 6 units,  300-349 - 8 units,  350 or above 10 units. Insulin PEN if approved, provide syringes and needles if needed. 15 mL 1  . losartan (COZAAR) 100 MG tablet Take 100 mg by mouth daily.    . metFORMIN (GLUCOPHAGE) 1000 MG tablet Take 1,000 mg by mouth 2 (two) times daily with a meal.      . metoprolol tartrate (LOPRESSOR) 100 MG tablet Take 1 tablet (100 mg total) by mouth 2 (two) times daily. 180 tablet 2  . nitroGLYCERIN (NITROSTAT) 0.4 MG SL tablet Place 1 tablet (0.4 mg total) under the tongue every 5 (five) minutes as needed for chest pain. (Patient not taking: Reported on 01/23/2017) 30 tablet 0  .  pantoprazole (PROTONIX) 40 MG tablet Take 1 tablet (40 mg total) by mouth daily. 30 tablet 12  . Potassium Chloride ER 20 MEQ TBCR Take 20 mEq by mouth daily.     . Prenatal Vit-Fe Fumarate-FA (MULTIVITAMIN-PRENATAL) 27-0.8 MG TABS tablet Take 2 tablets by mouth daily at 12 noon.    . ticagrelor (BRILINTA) 90 MG TABS tablet Take 1 tablet (90 mg total) by mouth 2 (two) times daily. 90 tablet 2  . valACYclovir (VALTREX) 500 MG tablet Take 500 mg by mouth 2 (two) times daily as needed.  Takes for outbreaks only.     No current facility-administered medications for this visit.     PHYSICAL EXAMINATION: ECOG PERFORMANCE STATUS: 1 - Symptomatic but completely ambulatory  Vitals:   07/31/17 1055  BP: 139/85  Pulse: 87  Resp: 18  Temp: (!) 97.5 F (36.4 C)  SpO2: 97%   Filed Weights   07/31/17 1055  Weight: 273 lb 1.6 oz (123.9 kg)    GENERAL:alert, no distress and comfortable.  She is morbidly obese SKIN: skin color, texture, turgor are normal, no rashes or significant lesions.  Noted skin bruising EYES: normal, Conjunctiva are pink and non-injected, sclera clear OROPHARYNX:no exudate, no erythema and lips, buccal mucosa, and tongue normal  NECK: supple, thyroid normal size, non-tender, without nodularity LYMPH:  no palpable lymphadenopathy in the cervical, axillary or inguinal LUNGS: clear to auscultation and percussion with normal breathing effort HEART: regular rate & rhythm and no murmurs and no lower extremity edema ABDOMEN:abdomen soft, non-tender and normal bowel sounds Musculoskeletal:no cyanosis of digits and no clubbing  NEURO: alert & oriented x 3 with fluent speech, no focal motor/sensory deficits  LABORATORY DATA:  I have reviewed the data as listed    Component Value Date/Time   NA 138 07/24/2017 1105   K 4.2 07/24/2017 1105   CL 109 10/17/2016 0531   CL 106 11/19/2012 0837   CO2 26 07/24/2017 1105   GLUCOSE 362 (H) 07/24/2017 1105   GLUCOSE 107 (H) 11/19/2012 0837   BUN 10.0 07/24/2017 1105   CREATININE 0.8 07/24/2017 1105   CALCIUM 8.8 07/24/2017 1105   PROT 6.3 (L) 07/24/2017 1105   ALBUMIN 3.4 (L) 07/24/2017 1105   AST 13 07/24/2017 1105   ALT 21 07/24/2017 1105   ALKPHOS 112 07/24/2017 1105   BILITOT 0.55 07/24/2017 1105   GFRNONAA >60 10/17/2016 0531   GFRAA >60 10/17/2016 0531    No results found for: SPEP, UPEP  Lab Results  Component Value Date   WBC 13.1 (H) 07/24/2017   NEUTROABS 6.4 07/24/2017   HGB 12.4  07/24/2017   HCT 38.2 07/24/2017   MCV 77.8 (L) 07/24/2017   PLT 193 07/24/2017      Chemistry      Component Value Date/Time   NA 138 07/24/2017 1105   K 4.2 07/24/2017 1105   CL 109 10/17/2016 0531   CL 106 11/19/2012 0837   CO2 26 07/24/2017 1105   BUN 10.0 07/24/2017 1105   CREATININE 0.8 07/24/2017 1105      Component Value Date/Time   CALCIUM 8.8 07/24/2017 1105   ALKPHOS 112 07/24/2017 1105   AST 13 07/24/2017 1105   ALT 21 07/24/2017 1105   BILITOT 0.55 07/24/2017 1105       ASSESSMENT & PLAN:  Splenic marginal zone b-cell lymphoma ([place]) She has improvement of her blood counts recently. Her last CT imaging in July 2017 showed abnormal lung nodules I plan to repeat imaging  study again to exclude recurrence of lymphoma  Pulmonary nodule, right The patient had incidental finding of a pulmonary nodule I recommend ordering CT scan for evaluation We will call her with test results  Iron deficiency anemia due to chronic blood loss The most likely cause of her anemia is due to chronic blood loss/malabsorption syndrome. We discussed some of the risks, benefits, and alternatives of intravenous iron infusions. The patient is symptomatic from anemia and the iron level is critically low. She tolerated oral iron supplement poorly and desires to achieved higher levels of iron faster for adequate hematopoesis. Some of the side-effects to be expected including risks of infusion reactions, phlebitis, headaches, nausea and fatigue.  The patient is willing to proceed. Patient education material was dispensed.  Goal is to keep ferritin level greater than 100 and normalization of iron saturation We will give her 1 dose and recheck in her next visit   Diabetes mellitus type 2, uncontrolled ([place]) She has poorly controlled diabetes We discussed the importance of getting her diabetes under control given her coronary artery disease I recommend close follow-up with her endocrinologist for  medication adjustment   Orders Placed This Encounter  Procedures  . CT CHEST WO CONTRAST    Standing Status:   Future    Standing Expiration Date:   07/31/2018    Order Specific Question:   Preferred imaging location?    Answer:   [facility]    Order Specific Question:   Radiology Contrast Protocol - do NOT remove file path    Answer:   [email or web address]    Order Specific Question:   Is patient pregnant?    Answer:   No  . CBC with Differential/Platelet    Standing Status:   Future    Standing Expiration Date:   09/04/2018  . Comprehensive metabolic panel    Standing Status:   Future    Standing Expiration Date:   09/04/2018  . Ferritin    Standing Status:   Future    Standing Expiration Date:   07/31/2018  . Iron and TIBC    Standing Status:   Future    Standing Expiration Date:   09/04/2018  . Lactate dehydrogenase    Standing Status:   Future    Standing Expiration Date:   07/31/2018   All questions were answered. The patient knows to call the clinic with any problems, questions or concerns. No barriers to learning was detected. I spent 20 minutes counseling the patient face to face. The total time spent in the appointment was 25 minutes and more than 50% was on counseling and review of test results     Heath Lark, MD 07/31/2017 12:59 PM

## 2017-07-31 NOTE — Assessment & Plan Note (Signed)
The patient had incidental finding of a pulmonary nodule I recommend ordering CT scan for evaluation We will call her with test results

## 2017-08-10 ENCOUNTER — Ambulatory Visit (HOSPITAL_COMMUNITY)
Admission: RE | Admit: 2017-08-10 | Discharge: 2017-08-10 | Disposition: A | Discharge status: Home / Self Care | Payer: BLUE CROSS/BLUE SHIELD | Source: Ambulatory Visit | Attending: Hematology and Oncology | Admitting: Hematology and Oncology

## 2017-08-10 ENCOUNTER — Telehealth: Payer: Self-pay

## 2017-08-10 DIAGNOSIS — E041 Nontoxic single thyroid nodule: Secondary | ICD-10-CM | POA: Insufficient documentation

## 2017-08-10 DIAGNOSIS — R911 Solitary pulmonary nodule: Secondary | ICD-10-CM | POA: Diagnosis not present

## 2017-08-10 DIAGNOSIS — R918 Other nonspecific abnormal finding of lung field: Secondary | ICD-10-CM | POA: Diagnosis present

## 2017-08-10 DIAGNOSIS — I2510 Atherosclerotic heart disease of native coronary artery without angina pectoris: Secondary | ICD-10-CM | POA: Insufficient documentation

## 2017-08-10 NOTE — Telephone Encounter (Signed)
Called with below message.  She would like a referral to ENT/endocrinology.

## 2017-08-10 NOTE — Telephone Encounter (Signed)
-----   Message from Heath Lark, MD sent at 08/10/2017  3:42 PM EDT ----- Please let her know 1) Ct chest OK. No need further chest CT 2) incidental thyroid nodule. Not sure what that is. Does she wants to follow with primary doctor for this or we can refer her to see ENT/endocrinology ----- Message ----- From: Interface, Rad Results In Sent: 08/10/2017   9:34 AM To: Heath Lark, MD

## 2017-08-11 ENCOUNTER — Other Ambulatory Visit: Payer: Self-pay | Admitting: Hematology and Oncology

## 2017-08-11 ENCOUNTER — Telehealth: Payer: Self-pay | Admitting: *Deleted

## 2017-08-11 DIAGNOSIS — E041 Nontoxic single thyroid nodule: Secondary | ICD-10-CM | POA: Insufficient documentation

## 2017-08-11 NOTE — Telephone Encounter (Signed)
I sent referral to endocrinology

## 2017-08-11 NOTE — Telephone Encounter (Signed)
LM that referral was sent to endocrinology

## 2017-09-06 IMAGING — DX DG CHEST 2V
2 series · 2 of 2 positions shown · non-contrast
Comparison: CT 04/08/2016.  Chest x-ray 04/08/2016.

CLINICAL DATA: Chest pain.

EXAM:
CHEST  2 VIEW

[w chest pa]
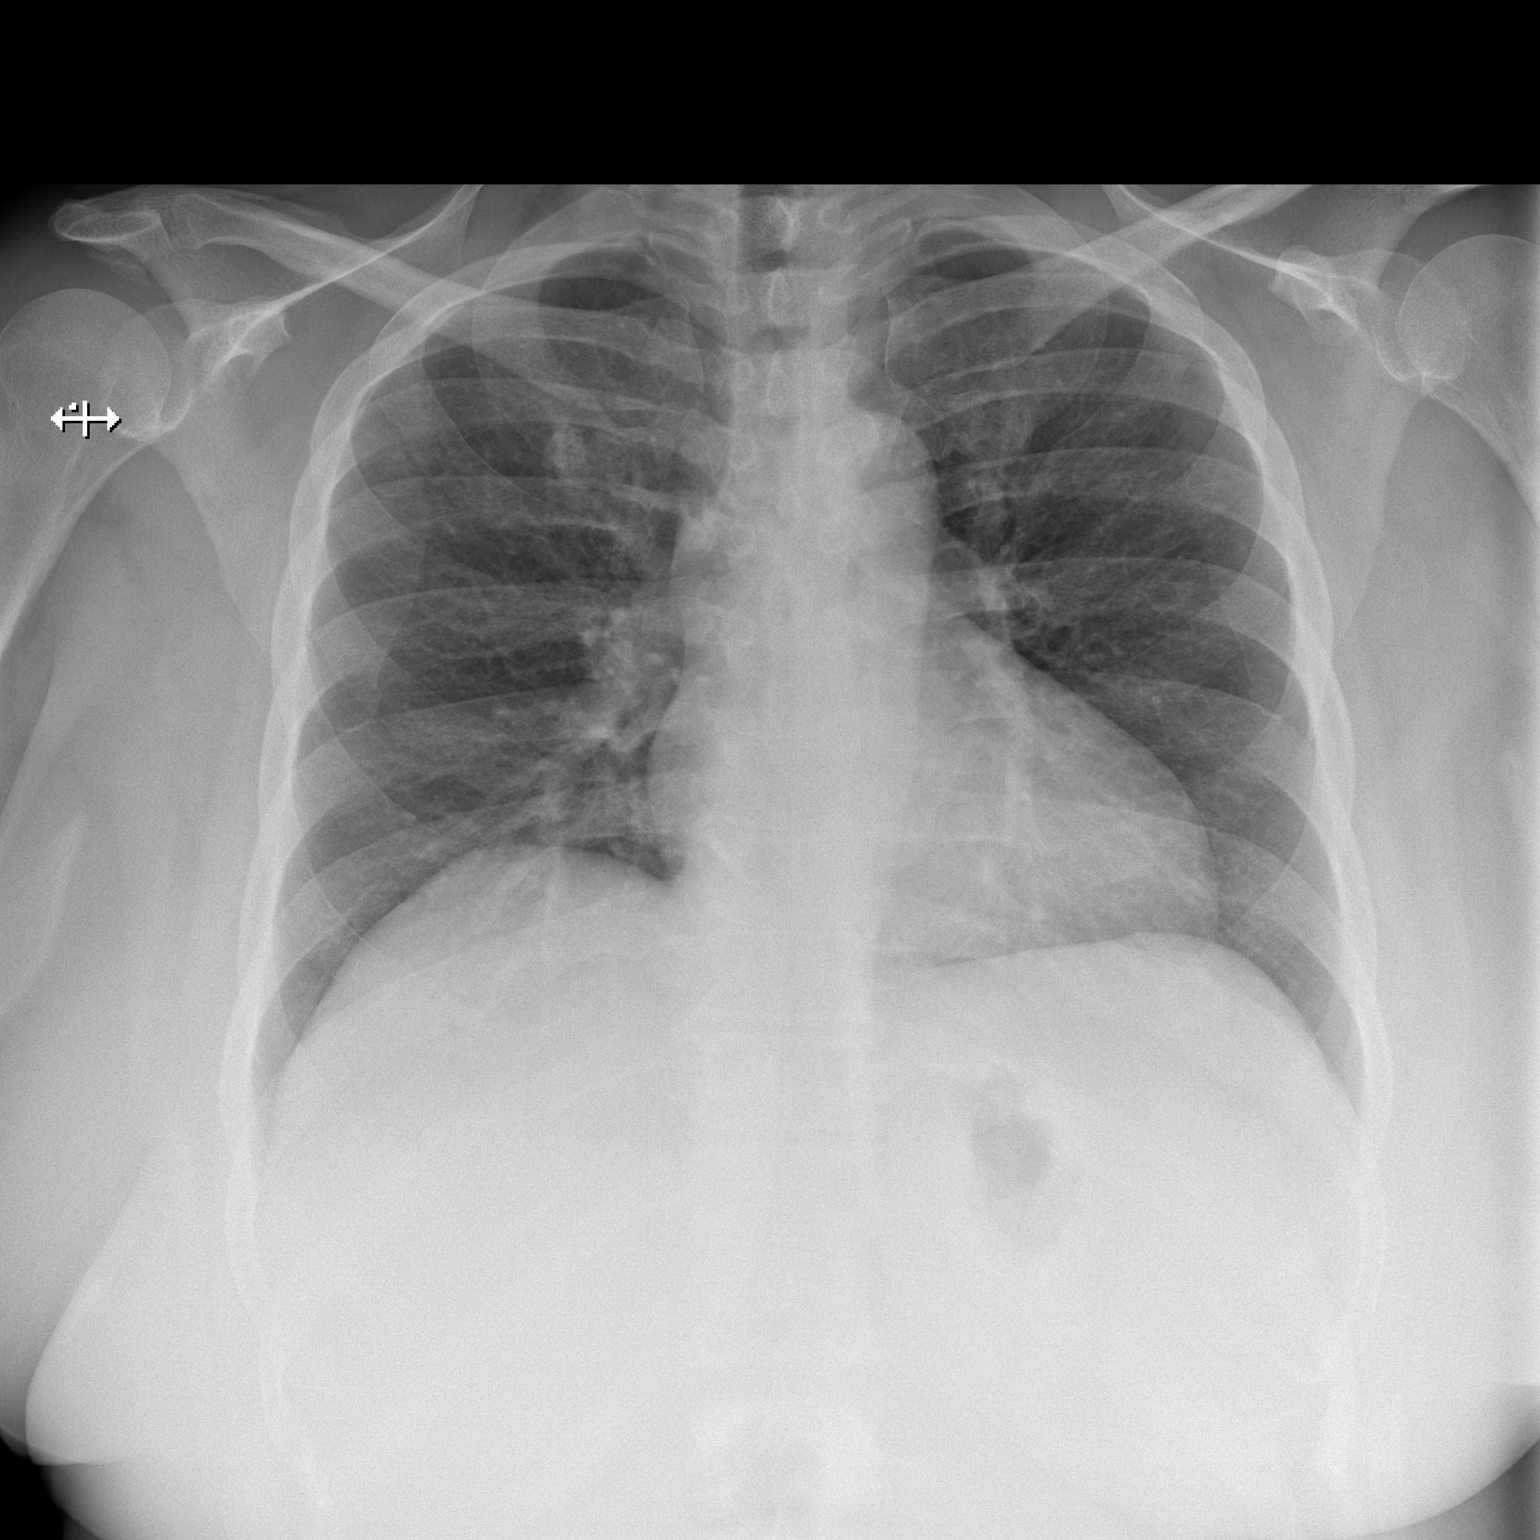

[w chest lat]
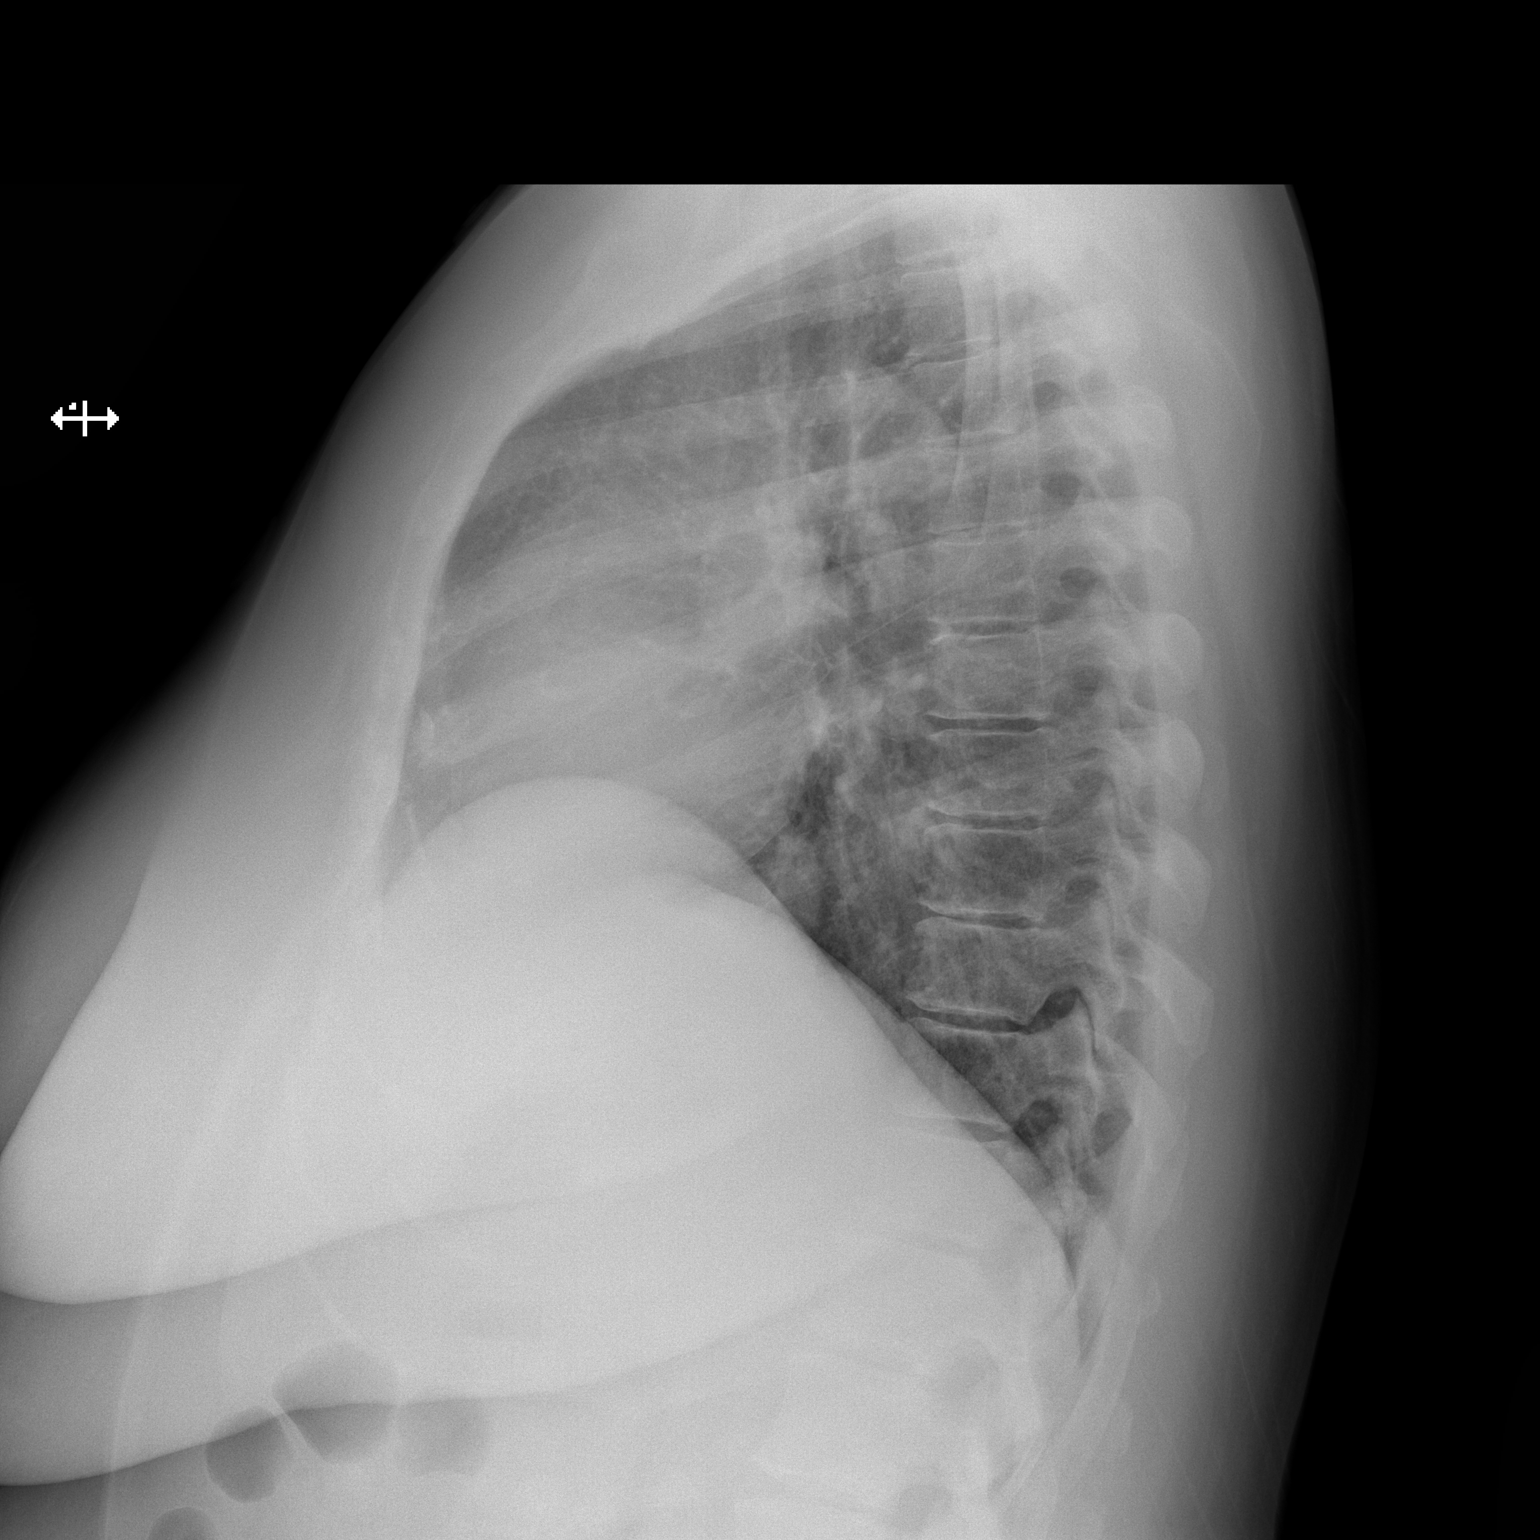

[2 of 2 positions shown; findings below may reference images not displayed]

FINDINGS: Mediastinum

Hilar structures normal. Lungs are clear. Heart size normal.
Previously identified tiny 5 mm nodule again noted over the right
lung base . No pleural effusion or pneumothorax. No focal bony
abnormality.
IMPRESSION: 1. Previously identified tiny 5 mm pulmonary nodule right lung base
again noted.

2. Low lung volumes.

## 2017-09-16 NOTE — Telephone Encounter (Signed)
Left message (ok per DPR) to follow up on scheduling CPAP titration.  Advised it is possible that if this is not done within a certain amount of time, insurance may require to start the process over.   Advised to call back to schedule titration.

## 2017-12-07 ENCOUNTER — Other Ambulatory Visit: Payer: Self-pay

## 2018-01-12 ENCOUNTER — Other Ambulatory Visit: Payer: Self-pay | Admitting: Cardiovascular Disease

## 2018-01-12 NOTE — Telephone Encounter (Signed)
Rx has been sent to the pharmacy electronically. ° °

## 2018-01-15 ENCOUNTER — Other Ambulatory Visit: Payer: Self-pay | Admitting: Hematology and Oncology

## 2018-01-15 ENCOUNTER — Telehealth: Payer: Self-pay | Admitting: Hematology and Oncology

## 2018-01-15 ENCOUNTER — Inpatient Hospital Stay: Payer: BLUE CROSS/BLUE SHIELD | Attending: Hematology and Oncology

## 2018-01-15 DIAGNOSIS — C8307 Small cell B-cell lymphoma, spleen: Secondary | ICD-10-CM | POA: Diagnosis not present

## 2018-01-15 DIAGNOSIS — E041 Nontoxic single thyroid nodule: Secondary | ICD-10-CM | POA: Insufficient documentation

## 2018-01-15 DIAGNOSIS — E1165 Type 2 diabetes mellitus with hyperglycemia: Secondary | ICD-10-CM | POA: Diagnosis not present

## 2018-01-15 DIAGNOSIS — I2510 Atherosclerotic heart disease of native coronary artery without angina pectoris: Secondary | ICD-10-CM | POA: Diagnosis not present

## 2018-01-15 DIAGNOSIS — R911 Solitary pulmonary nodule: Secondary | ICD-10-CM

## 2018-01-15 LAB — CBC WITH DIFFERENTIAL/PLATELET
BASOS ABS: 0.1 10*3/uL (ref 0.0–0.1)
Basophils Relative: 1 %
EOS ABS: 0.1 10*3/uL (ref 0.0–0.5)
Eosinophils Relative: 1 %
HCT: 43.2 % (ref 34.8–46.6)
HEMOGLOBIN: 14 g/dL (ref 11.6–15.9)
LYMPHS ABS: 6.4 10*3/uL — AB (ref 0.9–3.3)
Lymphocytes Relative: 53 %
MCH: 25.4 pg (ref 25.1–34.0)
MCHC: 32.5 g/dL (ref 31.5–36.0)
MCV: 78.1 fL — ABNORMAL LOW (ref 79.5–101.0)
Monocytes Absolute: 0.6 10*3/uL (ref 0.1–0.9)
Monocytes Relative: 5 %
NEUTROS PCT: 40 %
Neutro Abs: 4.9 10*3/uL (ref 1.5–6.5)
Platelets: 180 10*3/uL (ref 145–400)
RBC: 5.52 MIL/uL — AB (ref 3.70–5.45)
RDW: 14.9 % — ABNORMAL HIGH (ref 11.2–14.5)
WBC: 12.1 10*3/uL — ABNORMAL HIGH (ref 3.9–10.3)

## 2018-01-15 LAB — FERRITIN: Ferritin: 129 ng/mL (ref 9–269)

## 2018-01-15 LAB — COMPREHENSIVE METABOLIC PANEL
ALK PHOS: 115 U/L (ref 40–150)
ALT: 37 U/L (ref 0–55)
AST: 18 U/L (ref 5–34)
Albumin: 3.7 g/dL (ref 3.5–5.0)
Anion gap: 11 (ref 3–11)
BUN: 10 mg/dL (ref 7–26)
CALCIUM: 9.6 mg/dL (ref 8.4–10.4)
CO2: 27 mmol/L (ref 22–29)
CREATININE: 0.83 mg/dL (ref 0.60–1.10)
Chloride: 101 mmol/L (ref 98–109)
GFR calc non Af Amer: >60 mL/min (ref >60)
GLUCOSE: 350 mg/dL — AB (ref 70–140)
Potassium: 4.1 mmol/L (ref 3.5–5.1)
SODIUM: 139 mmol/L (ref 136–145)
Total Bilirubin: 0.6 mg/dL (ref 0.2–1.2)
Total Protein: 6.9 g/dL (ref 6.4–8.3)

## 2018-01-15 LAB — IRON AND TIBC
IRON: 44 ug/dL (ref 41–142)
Saturation Ratios: 14 % — ABNORMAL LOW (ref 21–57)
TIBC: 319 ug/dL (ref 236–444)
UIBC: 275 ug/dL

## 2018-01-15 LAB — LACTATE DEHYDROGENASE: LDH: 238 U/L (ref 125–245)

## 2018-01-15 NOTE — Telephone Encounter (Signed)
I have review blood work with the patient over the telephone Lymphocyte count is higher Serum iron is satisfactory The patient complained of mild worsening fatigue I recommend repeat CT scan for further evaluation given worsening lymphocytosis and symptoms to exclude lymphoma recurrence She agreed to proceed

## 2018-01-20 ENCOUNTER — Encounter (HOSPITAL_COMMUNITY): Payer: Self-pay | Admitting: Radiology

## 2018-01-20 ENCOUNTER — Ambulatory Visit (HOSPITAL_COMMUNITY)
Admission: RE | Admit: 2018-01-20 | Discharge: 2018-01-20 | Disposition: A | Discharge status: Home / Self Care | Payer: BLUE CROSS/BLUE SHIELD | Source: Ambulatory Visit | Attending: Hematology and Oncology | Admitting: Hematology and Oncology

## 2018-01-20 DIAGNOSIS — R161 Splenomegaly, not elsewhere classified: Secondary | ICD-10-CM | POA: Diagnosis not present

## 2018-01-20 DIAGNOSIS — E041 Nontoxic single thyroid nodule: Secondary | ICD-10-CM | POA: Insufficient documentation

## 2018-01-20 DIAGNOSIS — I700 Atherosclerosis of aorta: Secondary | ICD-10-CM | POA: Insufficient documentation

## 2018-01-20 DIAGNOSIS — C8307 Small cell B-cell lymphoma, spleen: Secondary | ICD-10-CM | POA: Diagnosis not present

## 2018-01-20 DIAGNOSIS — I2510 Atherosclerotic heart disease of native coronary artery without angina pectoris: Secondary | ICD-10-CM | POA: Insufficient documentation

## 2018-01-20 DIAGNOSIS — R911 Solitary pulmonary nodule: Secondary | ICD-10-CM | POA: Diagnosis not present

## 2018-01-20 MED ORDER — IOHEXOL 300 MG/ML  SOLN
100.0000 mL | Freq: Once | INTRAMUSCULAR | Status: AC | PRN
  Administered 2018-01-20: 100 mL via INTRAVENOUS

## 2018-01-22 ENCOUNTER — Encounter: Payer: Self-pay | Admitting: Hematology and Oncology

## 2018-01-22 ENCOUNTER — Inpatient Hospital Stay (HOSPITAL_BASED_OUTPATIENT_CLINIC_OR_DEPARTMENT_OTHER): Payer: BLUE CROSS/BLUE SHIELD | Admitting: Hematology and Oncology

## 2018-01-22 ENCOUNTER — Ambulatory Visit: Payer: BLUE CROSS/BLUE SHIELD

## 2018-01-22 ENCOUNTER — Telehealth: Payer: Self-pay | Admitting: Hematology and Oncology

## 2018-01-22 VITALS — BP 127/83 | HR 90 | Temp 98.2°F | Resp 18 | Ht 69.0 in | Wt 273.2 lb

## 2018-01-22 DIAGNOSIS — D500 Iron deficiency anemia secondary to blood loss (chronic): Secondary | ICD-10-CM

## 2018-01-22 DIAGNOSIS — E1165 Type 2 diabetes mellitus with hyperglycemia: Secondary | ICD-10-CM | POA: Diagnosis not present

## 2018-01-22 DIAGNOSIS — I2510 Atherosclerotic heart disease of native coronary artery without angina pectoris: Secondary | ICD-10-CM

## 2018-01-22 DIAGNOSIS — C8307 Small cell B-cell lymphoma, spleen: Secondary | ICD-10-CM | POA: Diagnosis not present

## 2018-01-22 DIAGNOSIS — E041 Nontoxic single thyroid nodule: Secondary | ICD-10-CM

## 2018-01-22 NOTE — Assessment & Plan Note (Signed)
She has poorly controlled diabetes We have extensive discussion about dietary modification and weight loss strategy

## 2018-01-22 NOTE — Assessment & Plan Note (Signed)
Recent iron studies show adequate iron reserves She is not anemic Observe for now

## 2018-01-22 NOTE — Telephone Encounter (Signed)
Gave patient AVs and calendar of upcoming July appointments.  °

## 2018-01-22 NOTE — Assessment & Plan Note (Signed)
CT scan show significant coronary calcification We discussed risk factor modification

## 2018-01-22 NOTE — Assessment & Plan Note (Signed)
Recent blood count show lymphocytosis, likely indicative of early disease recurrence CT scan showed no evidence of lymphoma but persistent involvement of bone marrow or spleen is possible Overall, she is not symptomatic I plan to see her back in 3 months for further follow-up She is educated to watch for signs and symptoms of cancer recurrence

## 2018-01-22 NOTE — Progress Notes (Signed)
[facility] OFFICE PROGRESS NOTE  Patient Care Team: Rogers Blocker, MD as PCP - General (Internal Medicine) Heath Lark, MD as Consulting Physician (Hematology and Oncology)  ASSESSMENT & PLAN:  Splenic marginal zone b-cell lymphoma ([place]) Recent blood count show lymphocytosis, likely indicative of early disease recurrence CT scan showed no evidence of lymphoma but persistent involvement of bone marrow or spleen is possible Overall, she is not symptomatic I plan to see her back in 3 months for further follow-up She is educated to watch for signs and symptoms of cancer recurrence  Diabetes mellitus type 2, uncontrolled ([place]) She has poorly controlled diabetes We have extensive discussion about dietary modification and weight loss strategy  CAD in native artery CT scan show significant coronary calcification We discussed risk factor modification  Iron deficiency anemia due to chronic blood loss Recent iron studies show adequate iron reserves She is not anemic Observe for now  Thyroid nodule CT showed thyroid nodule I will refer her to endocrinologist for management and follow-up   Orders Placed This Encounter  Procedures  . Ambulatory referral to Endocrinology    Referral Priority:   Routine    Referral Type:   Consultation    Referral Reason:   Specialty Services Required    Number of Visits Requested:   1    INTERVAL HISTORY: Please see below for problem oriented charting. She returns for further follow-up of imaging study and blood work She is not symptomatic with infection, anorexia or abnormal weight loss No new lymphadenopathy She denies recent cardiac issues The patient denies any recent signs or symptoms of bleeding such as spontaneous epistaxis, hematuria or hematochezia.  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      06/07/2014 Imaging    She had a PET scan which showed predominant splenic involvement.      06/16/2014 - 07/07/2014 Chemotherapy    She started on weekly rituximab.      06/16/2014 Adverse Reaction    She had infusion reaction, resolved with IV dexamethasone.      08/18/2014 Imaging    PET CT scan show resolution of hypermetabolic activity.      04/08/2016 Imaging    1. No evidence for aortic dissection or vascular injury. 2. Mild atherosclerotic changes within the abdominal aorta and branch vessels without aneurysm. 3. No significant adenopathy or evidence for recurrent lymphoma. 4. Stable mild splenomegaly. 5. 5 mm nodule in the right lower lobe demonstrates interval increase in size. No follow-up needed if patient is low-risk. Non-contrast chest CT can be considered in 12 months if patient is high-risk.  6. Degenerate changes at the SI joints bilaterally.      01/21/2018 Imaging    1. Persistent mild splenomegaly, similar to prior examinations. No other findings to suggest recurrent disease in the chest, abdomen or pelvis. 2. Aortic atherosclerosis, in addition to two vessel coronary artery disease. Please note that although the presence of coronary artery calcium documents the presence of coronary artery disease, the severity of this disease and any potential stenosis cannot be assessed on this non-gated CT examination. Assessment for potential risk factor modification, dietary therapy or pharmacologic therapy may be warranted, if clinically indicated.  3. Enlarging heterogeneously enhancing and partially  calcified left thyroid lobe nodule. Further evaluation with nonemergent thyroid ultrasound is recommended in the near future to better evaluate this lesion and determine if there is a need for fine-needle aspiration. 4. Additional incidental findings, as above.        REVIEW OF SYSTEMS:   Constitutional: Denies fevers, chills or abnormal weight loss Eyes: Denies blurriness of vision Ears, nose, mouth, throat, and face: Denies mucositis or sore throat Respiratory: Denies cough, dyspnea or wheezes Cardiovascular: Denies palpitation, chest discomfort or lower extremity swelling Gastrointestinal:  Denies nausea, heartburn or change in bowel habits Skin: Denies abnormal skin rashes Lymphatics: Denies new lymphadenopathy or easy bruising Neurological:Denies numbness, tingling or new weaknesses Behavioral/Psych: Mood is stable, no new changes  All other systems were reviewed with the patient and are negative.  I have reviewed the past medical history, past surgical history, social history and family history with the patient and they are unchanged from previous note.  ALLERGIES:  is allergic to erythromycin; lovastatin; and tolnaftate.  MEDICATIONS:  Current Outpatient Medications  Medication Sig Dispense Refill  . acetaminophen (TYLENOL) 500 MG tablet Take 1 tablet (500 mg total) by mouth every 8 (eight) hours as needed (pain). 30 tablet 0  . aspirin 81 MG chewable tablet Chew 1 tablet (81 mg total) by mouth daily. 30 tablet 0  . atorvastatin (LIPITOR) 80 MG tablet Take 1 tablet (80 mg total) by mouth daily at 6 PM. 30 tablet 12  . fish oil-omega-3 fatty acids 1000 MG capsule Take 1 g by mouth 2 (two) times daily.     . furosemide (LASIX) 20 MG tablet Take 1 tablet (20 mg total) by mouth daily. 30 tablet 3  . insulin aspart (NOVOLOG FLEXPEN) 100 UNIT/ML FlexPen Before each meal 3 times a day, 140-199 - 2 units, 200-250 - 4 units, 251-299 - 6 units,  300-349 - 8 units,  350 or above 10 units. Insulin PEN if approved, provide syringes and needles if needed. 15 mL 1  . losartan (COZAAR) 100 MG tablet Take 100 mg by mouth daily.    . metFORMIN (GLUCOPHAGE) 1000 MG tablet Take 1,000 mg by mouth 2 (two) times daily with a meal.      . metoprolol tartrate  (LOPRESSOR) 100 MG tablet TAKE 1 TABLET BY MOUTH TWICE A DAY 180 tablet 1  . nitroGLYCERIN (NITROSTAT) 0.4 MG SL tablet Place 1 tablet (0.4 mg total) under the tongue every 5 (five) minutes as needed for chest pain. (Patient not taking: Reported on 01/23/2017) 30 tablet 0  . pantoprazole (PROTONIX) 40 MG tablet Take 1 tablet (40 mg total) by mouth daily. 30 tablet 12  . Potassium Chloride ER 20 MEQ TBCR Take 20 mEq by mouth daily.     . Prenatal Vit-Fe Fumarate-FA (MULTIVITAMIN-PRENATAL) 27-0.8 MG TABS tablet Take 2 tablets by mouth daily at 12 noon.    . ticagrelor (BRILINTA) 90 MG TABS tablet Take 1 tablet (90 mg total) by mouth 2 (two) times daily. 90 tablet 2  . valACYclovir (VALTREX) 500 MG tablet Take 500 mg by mouth 2 (two) times daily as needed. Takes for outbreaks only.     No current facility-administered medications for this visit.     PHYSICAL EXAMINATION: ECOG PERFORMANCE STATUS: 0 - Asymptomatic  Vitals:   01/22/18 0840  BP: 127/83  Pulse: 90  Resp: 18  Temp: 98.2 F (36.8 C)  SpO2: 99%   Filed Weights   01/22/18 0840  Weight: 273 lb 3.2 oz (123.9 kg)  GENERAL:alert, no distress and comfortable NEURO: alert & oriented x 3 with fluent speech, no focal motor/sensory deficits  LABORATORY DATA:  I have reviewed the data as listed    Component Value Date/Time   NA 139 01/15/2018 0822   NA 138 07/24/2017 1105   K 4.1 01/15/2018 0822   K 4.2 07/24/2017 1105   CL 101 01/15/2018 0822   CL 106 11/19/2012 0837   CO2 27 01/15/2018 0822   CO2 26 07/24/2017 1105   GLUCOSE 350 (H) 01/15/2018 0822   GLUCOSE 362 (H) 07/24/2017 1105   GLUCOSE 107 (H) 11/19/2012 0837   BUN 10 01/15/2018 0822   BUN 10.0 07/24/2017 1105   CREATININE 0.83 01/15/2018 0822   CREATININE 0.8 07/24/2017 1105   CALCIUM 9.6 01/15/2018 0822   CALCIUM 8.8 07/24/2017 1105   PROT 6.9 01/15/2018 0822   PROT 6.3 (L) 07/24/2017 1105   ALBUMIN 3.7 01/15/2018 0822   ALBUMIN 3.4 (L) 07/24/2017 1105    AST 18 01/15/2018 0822   AST 13 07/24/2017 1105   ALT 37 01/15/2018 0822   ALT 21 07/24/2017 1105   ALKPHOS 115 01/15/2018 0822   ALKPHOS 112 07/24/2017 1105   BILITOT 0.6 01/15/2018 0822   BILITOT 0.55 07/24/2017 1105   GFRNONAA >60 01/15/2018 0822   GFRAA >60 01/15/2018 0822    No results found for: SPEP, UPEP  Lab Results  Component Value Date   WBC 12.1 (H) 01/15/2018   NEUTROABS 4.9 01/15/2018   HGB 14.0 01/15/2018   HCT 43.2 01/15/2018   MCV 78.1 (L) 01/15/2018   PLT 180 01/15/2018      Chemistry      Component Value Date/Time   NA 139 01/15/2018 0822   NA 138 07/24/2017 1105   K 4.1 01/15/2018 0822   K 4.2 07/24/2017 1105   CL 101 01/15/2018 0822   CL 106 11/19/2012 0837   CO2 27 01/15/2018 0822   CO2 26 07/24/2017 1105   BUN 10 01/15/2018 0822   BUN 10.0 07/24/2017 1105   CREATININE 0.83 01/15/2018 0822   CREATININE 0.8 07/24/2017 1105      Component Value Date/Time   CALCIUM 9.6 01/15/2018 0822   CALCIUM 8.8 07/24/2017 1105   ALKPHOS 115 01/15/2018 0822   ALKPHOS 112 07/24/2017 1105   AST 18 01/15/2018 0822   AST 13 07/24/2017 1105   ALT 37 01/15/2018 0822   ALT 21 07/24/2017 1105   BILITOT 0.6 01/15/2018 0822   BILITOT 0.55 07/24/2017 1105       RADIOGRAPHIC STUDIES: I have reviewed recent imaging study with the patient and sister I have personally reviewed the radiological images as listed and agreed with the findings in the report. Ct Chest W Contrast  Result Date: 01/21/2018 CLINICAL DATA:  52 year old female with history of splenic marginal zone B-cell lymphoma diagnosed in 2010. Follow-up study. EXAM: CT CHEST, ABDOMEN, AND PELVIS WITH CONTRAST TECHNIQUE: Multidetector CT imaging of the chest, abdomen and pelvis was performed following the standard protocol during bolus administration of intravenous contrast. CONTRAST:  164mL OMNIPAQUE IOHEXOL 300 MG/ML  SOLN COMPARISON:  Chest CT 10/10/2017. FINDINGS: CT CHEST FINDINGS Cardiovascular: Heart  size is normal. There is no significant pericardial fluid, thickening or pericardial calcification. There is aortic atherosclerosis, as well as atherosclerosis of the great vessels of the mediastinum and the coronary arteries, including calcified atherosclerotic plaque in the left anterior descending and left circumflex coronary arteries. Mediastinum/Nodes: Mild enlargement of a heterogeneously enhancing and partially calcified nodule in  the left lobe of the thyroid gland which currently measures 3.0 cm (axial image 4 of series 3) as compared with 2.5 cm on 08/10/2017. No pathologically enlarged mediastinal or hilar lymph nodes. Esophagus is unremarkable in appearance. Lungs/Pleura: No suspicious pulmonary nodules or masses. No acute consolidative airspace disease. No pleural effusions. Musculoskeletal: There are no aggressive appearing lytic or blastic lesions noted in the visualized portions of the skeleton. CT ABDOMEN PELVIS FINDINGS Hepatobiliary: No suspicious cystic or solid hepatic lesions. No intra or extrahepatic biliary ductal dilatation. Gallbladder is normal in appearance. Pancreas: No pancreatic mass. No pancreatic ductal dilatation. No pancreatic or peripancreatic fluid or inflammatory changes. Spleen: Spleen is enlarged measuring 6.1 x 15.1 x 12.0 cm (estimated splenic volume of 553 mL). Adrenals/Urinary Tract: Bilateral kidneys and bilateral adrenal glands are normal in appearance. No hydroureteronephrosis. Urinary bladder is normal in appearance. Stomach/Bowel: Normal appearance of the stomach. No pathologic dilatation of small bowel or colon. Normal appendix. Vascular/Lymphatic: Atherosclerosis in the abdominal aorta, without evidence of aneurysm or dissection in the abdominal or pelvic vasculature. No lymphadenopathy noted in the abdomen or pelvis. Reproductive: 7.8 x 7.5 x 7.7 cm mass in the uterine fundus slightly to the left of midline, presumably a fibroid, similar to prior PET-CT. Ovaries are  unremarkable in appearance. Other: No significant volume of ascites.  No pneumoperitoneum. Musculoskeletal: There are no aggressive appearing lytic or blastic lesions noted in the visualized portions of the skeleton. IMPRESSION: 1. Persistent mild splenomegaly, similar to prior examinations. No other findings to suggest recurrent disease in the chest, abdomen or pelvis. 2. Aortic atherosclerosis, in addition to two vessel coronary artery disease. Please note that although the presence of coronary artery calcium documents the presence of coronary artery disease, the severity of this disease and any potential stenosis cannot be assessed on this non-gated CT examination. Assessment for potential risk factor modification, dietary therapy or pharmacologic therapy may be warranted, if clinically indicated. 3. Enlarging heterogeneously enhancing and partially calcified left thyroid lobe nodule. Further evaluation with nonemergent thyroid ultrasound is recommended in the near future to better evaluate this lesion and determine if there is a need for fine-needle aspiration. 4. Additional incidental findings, as above. Electronically Signed   By: Vinnie Langton M.D.   On: 01/21/2018 09:44   Ct Abdomen Pelvis W Contrast  Result Date: 01/21/2018 CLINICAL DATA:  52 year old female with history of splenic marginal zone B-cell lymphoma diagnosed in 2010. Follow-up study. EXAM: CT CHEST, ABDOMEN, AND PELVIS WITH CONTRAST TECHNIQUE: Multidetector CT imaging of the chest, abdomen and pelvis was performed following the standard protocol during bolus administration of intravenous contrast. CONTRAST:  149mL OMNIPAQUE IOHEXOL 300 MG/ML  SOLN COMPARISON:  Chest CT 10/10/2017. FINDINGS: CT CHEST FINDINGS Cardiovascular: Heart size is normal. There is no significant pericardial fluid, thickening or pericardial calcification. There is aortic atherosclerosis, as well as atherosclerosis of the great vessels of the mediastinum and the  coronary arteries, including calcified atherosclerotic plaque in the left anterior descending and left circumflex coronary arteries. Mediastinum/Nodes: Mild enlargement of a heterogeneously enhancing and partially calcified nodule in the left lobe of the thyroid gland which currently measures 3.0 cm (axial image 4 of series 3) as compared with 2.5 cm on 08/10/2017. No pathologically enlarged mediastinal or hilar lymph nodes. Esophagus is unremarkable in appearance. Lungs/Pleura: No suspicious pulmonary nodules or masses. No acute consolidative airspace disease. No pleural effusions. Musculoskeletal: There are no aggressive appearing lytic or blastic lesions noted in the visualized portions of the skeleton. CT  ABDOMEN PELVIS FINDINGS Hepatobiliary: No suspicious cystic or solid hepatic lesions. No intra or extrahepatic biliary ductal dilatation. Gallbladder is normal in appearance. Pancreas: No pancreatic mass. No pancreatic ductal dilatation. No pancreatic or peripancreatic fluid or inflammatory changes. Spleen: Spleen is enlarged measuring 6.1 x 15.1 x 12.0 cm (estimated splenic volume of 553 mL). Adrenals/Urinary Tract: Bilateral kidneys and bilateral adrenal glands are normal in appearance. No hydroureteronephrosis. Urinary bladder is normal in appearance. Stomach/Bowel: Normal appearance of the stomach. No pathologic dilatation of small bowel or colon. Normal appendix. Vascular/Lymphatic: Atherosclerosis in the abdominal aorta, without evidence of aneurysm or dissection in the abdominal or pelvic vasculature. No lymphadenopathy noted in the abdomen or pelvis. Reproductive: 7.8 x 7.5 x 7.7 cm mass in the uterine fundus slightly to the left of midline, presumably a fibroid, similar to prior PET-CT. Ovaries are unremarkable in appearance. Other: No significant volume of ascites.  No pneumoperitoneum. Musculoskeletal: There are no aggressive appearing lytic or blastic lesions noted in the visualized portions of the  skeleton. IMPRESSION: 1. Persistent mild splenomegaly, similar to prior examinations. No other findings to suggest recurrent disease in the chest, abdomen or pelvis. 2. Aortic atherosclerosis, in addition to two vessel coronary artery disease. Please note that although the presence of coronary artery calcium documents the presence of coronary artery disease, the severity of this disease and any potential stenosis cannot be assessed on this non-gated CT examination. Assessment for potential risk factor modification, dietary therapy or pharmacologic therapy may be warranted, if clinically indicated. 3. Enlarging heterogeneously enhancing and partially calcified left thyroid lobe nodule. Further evaluation with nonemergent thyroid ultrasound is recommended in the near future to better evaluate this lesion and determine if there is a need for fine-needle aspiration. 4. Additional incidental findings, as above. Electronically Signed   By: Vinnie Langton M.D.   On: 01/21/2018 09:44    All questions were answered. The patient knows to call the clinic with any problems, questions or concerns. No barriers to learning was detected.  I spent 20 minutes counseling the patient face to face. The total time spent in the appointment was 30 minutes and more than 50% was on counseling and review of test results  Heath Lark, MD 01/22/2018 2:37 PM

## 2018-01-22 NOTE — Assessment & Plan Note (Signed)
CT showed thyroid nodule I will refer her to endocrinologist for management and follow-up

## 2018-02-18 ENCOUNTER — Ambulatory Visit: Payer: BLUE CROSS/BLUE SHIELD | Admitting: Endocrinology

## 2018-02-24 ENCOUNTER — Encounter: Payer: Self-pay | Admitting: Endocrinology

## 2018-02-24 ENCOUNTER — Ambulatory Visit: Payer: BLUE CROSS/BLUE SHIELD | Admitting: Endocrinology

## 2018-02-24 VITALS — BP 110/70 | HR 96 | Ht 69.0 in | Wt 260.0 lb

## 2018-02-24 DIAGNOSIS — E041 Nontoxic single thyroid nodule: Secondary | ICD-10-CM

## 2018-02-24 LAB — TSH: TSH: 2.23 u[IU]/mL (ref 0.35–4.50)

## 2018-02-24 LAB — T4, FREE: FREE T4: 0.96 ng/dL (ref 0.60–1.60)

## 2018-02-24 NOTE — Progress Notes (Signed)
Subjective:    Patient ID: Desiree [name], female    DOB: November 12, 1965, 52 y.o.   MRN: [id number]  HPI Pt is a new pt today, for nodular thyroid.  In early 2019, pt was incidentally noted on CT to have a nodule at the left thyroid lobe.  she is unaware of ever having had thyroid problems in the past.  she has no h/o XRT or surgery to the neck.  She reports slight weight loss, but no assoc neck swelling.   Past Medical History:  Diagnosis Date  . 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 Fatigue 11/17/2013  . High cholesterol   . Hypertension   . Iron deficiency anemia due to chronic blood loss   . Leukocytosis 07/27/2011  . Myocardial infarction ([place]) 03/2016  . Non Hodgkin's lymphoma ([place])    recieving chemo 06/2014  . Palpitation 06/30/2014  . 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 Tachycardia with 100 - 120 beats per minute 11/29/2014  . Type II diabetes mellitus ([place])     Past Surgical History:  Procedure Laterality Date  . BONE MARROW BIOPSY  0215 X 2  . CARDIAC CATHETERIZATION N/A 04/09/2016   Procedure: Left Heart Cath and Coronary Angiography;  Surgeon: Peter M Martinique, MD;  Location: [facility];  Service: Cardiovascular;  Laterality: N/A;  . CARDIAC CATHETERIZATION N/A 04/09/2016   Procedure: Coronary Stent Intervention;  Surgeon: Peter M Martinique, MD;  Location: [facility];  Service: Cardiovascular;  Laterality: N/A;  . CARDIAC CATHETERIZATION N/A 10/16/2016   Procedure: Left Heart Cath and Coronary Angiography;  Surgeon: Peter M Martinique, MD;  Location: [facility];  Service: Cardiovascular;  Laterality: N/A;  . CARDIAC CATHETERIZATION N/A 10/16/2016   Procedure: Coronary Stent Intervention;  Surgeon: Peter M Martinique, MD;  Location: [facility];  Service: Cardiovascular;  Laterality: N/A;  . CARDIAC CATHETERIZATION N/A 10/16/2016   Procedure: Intravascular Ultrasound/IVUS;  Surgeon: Peter M Martinique, MD;  Location: [facility];  Service: Cardiovascular;  Laterality: N/A;  . WISDOM TOOTH EXTRACTION      Social History   Socioeconomic History  . Marital status: Married    Spouse name: Not on file  . Number of children: Not on file  . Years of education: Not on file  . Highest education level: Not on file  Occupational History  . Not on file  Social Needs  . Financial resource strain: Not on file  . Food insecurity:    Worry: Not on file    Inability: Not on file  . Transportation needs:    Medical: Not on file    Non-medical: Not on file  Tobacco Use  . Smoking status: Former Smoker    Packs/day: 0.50    Years: 22.00    Pack years: 11.00    Types: Cigarettes    Last attempt to quit: 10/13/2005    Years since quitting: 12.3  . Smokeless tobacco: Never Used  Substance and Sexual Activity  . Alcohol use: Yes    Comment: 10/15/2016 "nothing in the last 3 years"  . Drug use: No  . Sexual activity: Yes    Birth control/protection: None  Lifestyle  . Physical activity:    Days per week: Not on file    Minutes per session: Not on file  . Stress: Not on file  Relationships  . Social connections:    Talks on phone: Not on file  Gets together: Not on file    Attends religious service: Not on file    Active member of club or organization: Not on file    Attends meetings of clubs or organizations: Not on file    Relationship status: Not on file  . Intimate partner violence:    Fear of current or ex partner: Not on file    Emotionally abused: Not on file    Physically abused: Not on file    Forced sexual activity: Not on file  Other Topics Concern  . Not on file  Social History Narrative  . Not on file    Current Outpatient Medications on File Prior to Visit  Medication Sig Dispense Refill  . acetaminophen (TYLENOL) 500 MG tablet Take 1 tablet (500 mg total) by mouth every 8 (eight) hours as needed (pain). 30 tablet 0  . aspirin 81 MG chewable tablet Chew 1 tablet (81 mg total)  by mouth daily. 30 tablet 0  . atorvastatin (LIPITOR) 80 MG tablet Take 1 tablet (80 mg total) by mouth daily at 6 PM. 30 tablet 12  . fish oil-omega-3 fatty acids 1000 MG capsule Take 1 g by mouth 2 (two) times daily.     . furosemide (LASIX) 20 MG tablet Take 1 tablet (20 mg total) by mouth daily. 30 tablet 3  . losartan (COZAAR) 100 MG tablet Take 100 mg by mouth daily.    . metFORMIN (GLUCOPHAGE) 1000 MG tablet Take 1,000 mg by mouth 2 (two) times daily with a meal.      . metoprolol tartrate (LOPRESSOR) 100 MG tablet TAKE 1 TABLET BY MOUTH TWICE A DAY 180 tablet 1  . nitroGLYCERIN (NITROSTAT) 0.4 MG SL tablet Place 1 tablet (0.4 mg total) under the tongue every 5 (five) minutes as needed for chest pain. 30 tablet 0  . pantoprazole (PROTONIX) 40 MG tablet Take 1 tablet (40 mg total) by mouth daily. 30 tablet 12  . Potassium Chloride ER 20 MEQ TBCR Take 20 mEq by mouth daily.     . Prenatal Vit-Fe Fumarate-FA (MULTIVITAMIN-PRENATAL) 27-0.8 MG TABS tablet Take 2 tablets by mouth daily at 12 noon.    . ticagrelor (BRILINTA) 90 MG TABS tablet Take 1 tablet (90 mg total) by mouth 2 (two) times daily. 90 tablet 2  . valACYclovir (VALTREX) 500 MG tablet Take 500 mg by mouth 2 (two) times daily as needed. Takes for outbreaks only.    . insulin aspart (NOVOLOG FLEXPEN) 100 UNIT/ML FlexPen Before each meal 3 times a day, 140-199 - 2 units, 200-250 - 4 units, 251-299 - 6 units,  300-349 - 8 units,  350 or above 10 units. Insulin PEN if approved, provide syringes and needles if needed. (Patient not taking: Reported on 02/24/2018) 15 mL 1   No current facility-administered medications on file prior to visit.     Allergies  Allergen Reactions  . Erythromycin Anaphylaxis    Swelling and difficulty breathing.  . Lovastatin     Leg cramps  . Tolnaftate Hives    Reaction caused by pills and the cream.    Family History  Problem Relation Age of Onset  . Cancer Maternal Aunt        breast ca  .  Breast cancer Maternal Aunt   . Cancer Maternal Uncle        lung ca  . Thyroid disease Neg Hx     BP 110/70 (BP Location: Right Arm, Patient Position: Sitting, Cuff Size: Normal)     Pulse 96   Ht 5' 9" (1.753 m)   Wt 260 lb (117.9 kg)   SpO2 98%   BMI 38.40 kg/m   Review of Systems Denies hoarseness, neck pain, visual loss, sob, cough, dysphagia, diarrhea, itching, flushing, easy bruising, depression, cold intolerance, headache, numbness, and rhinorrhea.  She has chronic leg swelling.      Objective:   Physical Exam VS: see vs page GEN: no distress HEAD: head: no deformity eyes: no periorbital swelling, no proptosis.  external nose and ears are normal.   mouth: no lesion seen NECK: 3 cm left thyroid nodule is palpable.   CHEST WALL: no deformity LUNGS: clear to auscultation CV: reg rate and rhythm, no murmur ABD: abdomen is soft, nontender.  no hepatosplenomegaly.  not distended.  no hernia MUSCULOSKELETAL: muscle bulk and strength are grossly normal.  no obvious joint swelling.  gait is normal and steady EXTEMITIES: no deformity.  1+ bilat leg edema.  There is bilateral onychomycosis of the toenails PULSES: no carotid bruit NEURO:  cn 2-12 grossly intact.   readily moves all 4's.  sensation is intact to touch on all 4's SKIN:  Normal texture and temperature.  No rash or suspicious lesion is visible.   NODES:  None palpable at the neck PSYCH: alert, well-oriented.  Does not appear anxious nor depressed.  CT: enlarging heterogeneously enhancing and partially calcified left thyroid lobe nodule.   Lab Results  Component Value Date   TSH 2.23 02/24/2018   I have reviewed outside records, and summarized: Pt was noted to have thyroid nodule, and referred here.  She had CT to f/u lymphoma, and the thyroid nodule was an incidental finding.      Assessment & Plan:  Thyroid nodule, new, uncertain etiology.  Euthyroid.   Patient Instructions  Let's recheck the ultrasound.   you will receive a phone call, about a day and time for an appointment. blood tests are requested for you today.  We'll let you know about the results.  When we confirm that the blood test is no overactive, you can schedule the biopsy right after the ultrasound.     

## 2018-02-24 NOTE — Patient Instructions (Signed)
Let's recheck the ultrasound.  you will receive a phone call, about a day and time for an appointment. blood tests are requested for you today.  We'll let you know about the results.  When we confirm that the blood test is no overactive, you can schedule the biopsy right after the ultrasound.

## 2018-03-12 ENCOUNTER — Other Ambulatory Visit: Payer: Self-pay | Admitting: Cardiovascular Disease

## 2018-03-18 ENCOUNTER — Ambulatory Visit
Admission: RE | Admit: 2018-03-18 | Discharge: 2018-03-18 | Disposition: A | Discharge status: Home / Self Care | Payer: BLUE CROSS/BLUE SHIELD | Source: Ambulatory Visit | Attending: Endocrinology | Admitting: Endocrinology

## 2018-03-18 DIAGNOSIS — E041 Nontoxic single thyroid nodule: Secondary | ICD-10-CM

## 2018-04-20 ENCOUNTER — Other Ambulatory Visit: Payer: Self-pay | Admitting: Hematology and Oncology

## 2018-04-20 DIAGNOSIS — C8307 Small cell B-cell lymphoma, spleen: Secondary | ICD-10-CM

## 2018-04-23 ENCOUNTER — Encounter: Payer: Self-pay | Admitting: Hematology and Oncology

## 2018-04-23 ENCOUNTER — Inpatient Hospital Stay: Payer: BLUE CROSS/BLUE SHIELD | Admitting: Hematology and Oncology

## 2018-04-23 ENCOUNTER — Telehealth: Payer: Self-pay | Admitting: Hematology and Oncology

## 2018-04-23 ENCOUNTER — Inpatient Hospital Stay: Payer: BLUE CROSS/BLUE SHIELD | Attending: Hematology and Oncology

## 2018-04-23 DIAGNOSIS — E1165 Type 2 diabetes mellitus with hyperglycemia: Secondary | ICD-10-CM | POA: Insufficient documentation

## 2018-04-23 DIAGNOSIS — C8307 Small cell B-cell lymphoma, spleen: Secondary | ICD-10-CM | POA: Insufficient documentation

## 2018-04-23 LAB — LACTATE DEHYDROGENASE: LDH: 219 U/L — ABNORMAL HIGH (ref 98–192)

## 2018-04-23 LAB — COMPREHENSIVE METABOLIC PANEL
ALT: 19 U/L (ref 0–44)
AST: 17 U/L (ref 15–41)
Albumin: 3.8 g/dL (ref 3.5–5.0)
Alkaline Phosphatase: 120 U/L (ref 38–126)
Anion gap: 8 (ref 5–15)
BUN: 9 mg/dL (ref 6–20)
CHLORIDE: 102 mmol/L (ref 98–111)
CO2: 29 mmol/L (ref 22–32)
CREATININE: 0.78 mg/dL (ref 0.44–1.00)
Calcium: 9.3 mg/dL (ref 8.9–10.3)
GFR calc Af Amer: >60 mL/min (ref >60)
Glucose, Bld: 260 mg/dL — ABNORMAL HIGH (ref 70–99)
POTASSIUM: 4.4 mmol/L (ref 3.5–5.1)
SODIUM: 139 mmol/L (ref 135–145)
Total Bilirubin: 0.6 mg/dL (ref 0.3–1.2)
Total Protein: 6.8 g/dL (ref 6.5–8.1)

## 2018-04-23 LAB — CBC WITH DIFFERENTIAL/PLATELET
BASOS PCT: 1 %
Basophils Absolute: 0.1 10*3/uL (ref 0.0–0.1)
EOS ABS: 0.1 10*3/uL (ref 0.0–0.5)
EOS PCT: 1 %
HCT: 39.4 % (ref 34.8–46.6)
Hemoglobin: 12.9 g/dL (ref 11.6–15.9)
Lymphocytes Relative: 55 %
Lymphs Abs: 6.8 10*3/uL — ABNORMAL HIGH (ref 0.9–3.3)
MCH: 25.5 pg (ref 25.1–34.0)
MCHC: 32.8 g/dL (ref 31.5–36.0)
MCV: 77.9 fL — ABNORMAL LOW (ref 79.5–101.0)
Monocytes Absolute: 0.6 10*3/uL (ref 0.1–0.9)
Monocytes Relative: 5 %
Neutro Abs: 4.6 10*3/uL (ref 1.5–6.5)
Neutrophils Relative %: 38 %
PLATELETS: 158 10*3/uL (ref 145–400)
RBC: 5.06 MIL/uL (ref 3.70–5.45)
RDW: 15.4 % — ABNORMAL HIGH (ref 11.2–14.5)
WBC: 12.2 10*3/uL — AB (ref 3.9–10.3)

## 2018-04-23 NOTE — Telephone Encounter (Signed)
Gave patient avs and calendar of upcoming oct appts.  °

## 2018-04-23 NOTE — Progress Notes (Signed)
Desiree [name] OFFICE PROGRESS NOTE  Patient Care Team: Rogers Blocker, MD as PCP - General (Internal Medicine) Heath Lark, MD as Consulting Physician (Hematology and Oncology)  ASSESSMENT & PLAN:  Splenic marginal zone b-cell lymphoma ([facility]) Recent blood count showed lymphocytosis, likely indicative of early disease recurrence CT scan showed no evidence of lymphoma but persistent involvement of bone marrow or spleen is possible Overall, she is not symptomatic I plan to see her back in 3 months for further follow-up She is educated to watch for signs and symptoms of cancer recurrence  Diabetes mellitus type 2, uncontrolled ([place]) She has lost some weight with dietary modification and increased exercise activity She is highly motivated to get better I congratulated her efforts   No orders of the defined types were placed in this encounter.   INTERVAL HISTORY: Please see below for problem oriented charting. She returns for further follow-up She has lost some weight through dietary modification and increase exercise activity She felt better She denies new lymphadenopathy No recent infection The patient denies any recent signs or symptoms of bleeding such as spontaneous epistaxis, hematuria or hematochezia.   SUMMARY OF ONCOLOGIC HISTORY:   Splenic marginal zone b-cell lymphoma ([place])   01/11/2009 Initial Diagnosis    Splenic marginal zone b-cell lymphoma      06/06/2014 Bone Marrow Biopsy    Bone marrow aspirate and biopsy confirmed extensive involvement by CD20 positive non-Hodgkin's lymphoma.      06/07/2014 Imaging    She had a PET scan which showed predominant splenic involvement.      06/16/2014 - 07/07/2014 Chemotherapy    She started on weekly rituximab.      06/16/2014 Adverse Reaction    She had infusion reaction, resolved with IV dexamethasone.      08/18/2014 Imaging    PET CT scan show resolution of hypermetabolic activity.      04/08/2016 Imaging   1. No evidence for aortic dissection or vascular injury. 2. Mild atherosclerotic changes within the abdominal aorta and branch vessels without aneurysm. 3. No significant adenopathy or evidence for recurrent lymphoma. 4. Stable mild splenomegaly. 5. 5 mm nodule in the right lower lobe demonstrates interval increase in size. No follow-up needed if patient is low-risk. Non-contrast chest CT can be considered in 12 months if patient is high-risk.  6. Degenerate changes at the SI joints bilaterally.      01/21/2018 Imaging    1. Persistent mild splenomegaly, similar to prior examinations. No other findings to suggest recurrent disease in the chest, abdomen or pelvis. 2. Aortic atherosclerosis, in addition to two vessel coronary artery disease. Please note that although the presence of coronary artery calcium documents the presence of coronary artery disease, the severity of this disease and any potential stenosis cannot be assessed on this non-gated CT examination. Assessment for potential risk factor modification, dietary therapy or pharmacologic therapy may be warranted, if clinically indicated.  3. Enlarging heterogeneously enhancing and partially calcified left thyroid lobe nodule. Further evaluation with nonemergent thyroid ultrasound is recommended in the near future to better evaluate this lesion and determine if there is a need for fine-needle aspiration. 4. Additional incidental findings, as above.       REVIEW OF SYSTEMS:   Constitutional: Denies fevers, chills  Eyes: Denies blurriness of vision Ears, nose, mouth, throat, and face: Denies mucositis or sore throat Respiratory: Denies cough, dyspnea or wheezes Cardiovascular: Denies palpitation, chest discomfort or lower extremity swelling Gastrointestinal:  Denies nausea, heartburn or  change in bowel habits Skin: Denies abnormal skin rashes Lymphatics: Denies new lymphadenopathy or easy bruising Neurological:Denies numbness, tingling or  new weaknesses Behavioral/Psych: Mood is stable, no new changes  All other systems were reviewed with the patient and are negative.  I have reviewed the past medical history, past surgical history, social history and family history with the patient and they are unchanged from previous note.  ALLERGIES:  is allergic to erythromycin; lovastatin; and tolnaftate.  MEDICATIONS:  Current Outpatient Medications  Medication Sig Dispense Refill  . acetaminophen (TYLENOL) 500 MG tablet Take 1 tablet (500 mg total) by mouth every 8 (eight) hours as needed (pain). 30 tablet 0  . aspirin 81 MG chewable tablet Chew 1 tablet (81 mg total) by mouth daily. 30 tablet 0  . atorvastatin (LIPITOR) 80 MG tablet Take 1 tablet (80 mg total) by mouth daily at 6 PM. 30 tablet 12  . BRILINTA 90 MG TABS tablet TAKE 1 TABLET BY MOUTH TWICE A DAY 60 tablet 4  . fish oil-omega-3 fatty acids 1000 MG capsule Take 1 g by mouth 2 (two) times daily.     . furosemide (LASIX) 20 MG tablet Take 1 tablet (20 mg total) by mouth daily. 30 tablet 3  . insulin aspart (NOVOLOG FLEXPEN) 100 UNIT/ML FlexPen Before each meal 3 times a day, 140-199 - 2 units, 200-250 - 4 units, 251-299 - 6 units,  300-349 - 8 units,  350 or above 10 units. Insulin PEN if approved, provide syringes and needles if needed. (Patient not taking: Reported on 02/24/2018) 15 mL 1  . losartan (COZAAR) 100 MG tablet Take 100 mg by mouth daily.    . metFORMIN (GLUCOPHAGE) 1000 MG tablet Take 1,000 mg by mouth 2 (two) times daily with a meal.      . metoprolol tartrate (LOPRESSOR) 100 MG tablet TAKE 1 TABLET BY MOUTH TWICE A DAY 180 tablet 1  . nitroGLYCERIN (NITROSTAT) 0.4 MG SL tablet Place 1 tablet (0.4 mg total) under the tongue every 5 (five) minutes as needed for chest pain. 30 tablet 0  . pantoprazole (PROTONIX) 40 MG tablet Take 1 tablet (40 mg total) by mouth daily. 30 tablet 12  . Potassium Chloride ER 20 MEQ TBCR Take 20 mEq by mouth daily.     . Prenatal  Vit-Fe Fumarate-FA (MULTIVITAMIN-PRENATAL) 27-0.8 MG TABS tablet Take 2 tablets by mouth daily at 12 noon.    . ticagrelor (BRILINTA) 90 MG TABS tablet Take 1 tablet (90 mg total) by mouth 2 (two) times daily. 90 tablet 2  . valACYclovir (VALTREX) 500 MG tablet Take 500 mg by mouth 2 (two) times daily as needed. Takes for outbreaks only.     No current facility-administered medications for this visit.     PHYSICAL EXAMINATION: ECOG PERFORMANCE STATUS: 0 - Asymptomatic  Vitals:   04/23/18 0846  BP: 117/78  Pulse: 81  Resp: 18  Temp: 98.2 F (36.8 C)  SpO2: 99%   Filed Weights   04/23/18 0846  Weight: 250 lb (113.4 kg)    GENERAL:alert, no distress and comfortable SKIN: skin color, texture, turgor are normal, no rashes or significant lesions EYES: normal, Conjunctiva are pink and non-injected, sclera clear OROPHARYNX:no exudate, no erythema and lips, buccal mucosa, and tongue normal  NECK: supple, thyroid normal size, non-tender, without nodularity LYMPH:  no palpable lymphadenopathy in the cervical, axillary or inguinal LUNGS: clear to auscultation and percussion with normal breathing effort HEART: regular rate & rhythm and no murmurs and  no lower extremity edema ABDOMEN:abdomen soft, non-tender and normal bowel sounds Musculoskeletal:no cyanosis of digits and no clubbing  NEURO: alert & oriented x 3 with fluent speech, no focal motor/sensory deficits  LABORATORY DATA:  I have reviewed the data as listed    Component Value Date/Time   NA 139 04/23/2018 0812   NA 138 07/24/2017 1105   K 4.4 04/23/2018 0812   K 4.2 07/24/2017 1105   CL 102 04/23/2018 0812   CL 106 11/19/2012 0837   CO2 29 04/23/2018 0812   CO2 26 07/24/2017 1105   GLUCOSE 260 (H) 04/23/2018 0812   GLUCOSE 362 (H) 07/24/2017 1105   GLUCOSE 107 (H) 11/19/2012 0837   BUN 9 04/23/2018 0812   BUN 10.0 07/24/2017 1105   CREATININE 0.78 04/23/2018 0812   CREATININE 0.8 07/24/2017 1105   CALCIUM 9.3  04/23/2018 0812   CALCIUM 8.8 07/24/2017 1105   PROT 6.8 04/23/2018 0812   PROT 6.3 (L) 07/24/2017 1105   ALBUMIN 3.8 04/23/2018 0812   ALBUMIN 3.4 (L) 07/24/2017 1105   AST 17 04/23/2018 0812   AST 13 07/24/2017 1105   ALT 19 04/23/2018 0812   ALT 21 07/24/2017 1105   ALKPHOS 120 04/23/2018 0812   ALKPHOS 112 07/24/2017 1105   BILITOT 0.6 04/23/2018 0812   BILITOT 0.55 07/24/2017 1105   GFRNONAA >60 04/23/2018 0812   GFRAA >60 04/23/2018 0812    No results found for: SPEP, UPEP  Lab Results  Component Value Date   WBC 12.2 (H) 04/23/2018   NEUTROABS 4.6 04/23/2018   HGB 12.9 04/23/2018   HCT 39.4 04/23/2018   MCV 77.9 (L) 04/23/2018   PLT 158 04/23/2018      Chemistry      Component Value Date/Time   NA 139 04/23/2018 0812   NA 138 07/24/2017 1105   K 4.4 04/23/2018 0812   K 4.2 07/24/2017 1105   CL 102 04/23/2018 0812   CL 106 11/19/2012 0837   CO2 29 04/23/2018 0812   CO2 26 07/24/2017 1105   BUN 9 04/23/2018 0812   BUN 10.0 07/24/2017 1105   CREATININE 0.78 04/23/2018 0812   CREATININE 0.8 07/24/2017 1105      Component Value Date/Time   CALCIUM 9.3 04/23/2018 0812   CALCIUM 8.8 07/24/2017 1105   ALKPHOS 120 04/23/2018 0812   ALKPHOS 112 07/24/2017 1105   AST 17 04/23/2018 0812   AST 13 07/24/2017 1105   ALT 19 04/23/2018 0812   ALT 21 07/24/2017 1105   BILITOT 0.6 04/23/2018 0812   BILITOT 0.55 07/24/2017 1105       All questions were answered. The patient knows to call the clinic with any problems, questions or concerns. No barriers to learning was detected.  I spent 10 minutes counseling the patient face to face. The total time spent in the appointment was 15 minutes and more than 50% was on counseling and review of test results  Heath Lark, MD 04/23/2018 11:39 AM

## 2018-04-23 NOTE — Assessment & Plan Note (Signed)
She has lost some weight with dietary modification and increased exercise activity She is highly motivated to get better I congratulated her efforts

## 2018-04-23 NOTE — Assessment & Plan Note (Signed)
Recent blood count showed lymphocytosis, likely indicative of early disease recurrence CT scan showed no evidence of lymphoma but persistent involvement of bone marrow or spleen is possible Overall, she is not symptomatic I plan to see her back in 3 months for further follow-up She is educated to watch for signs and symptoms of cancer recurrence

## 2018-05-31 ENCOUNTER — Other Ambulatory Visit: Payer: Self-pay | Admitting: Internal Medicine

## 2018-05-31 DIAGNOSIS — Z1231 Encounter for screening mammogram for malignant neoplasm of breast: Secondary | ICD-10-CM

## 2018-06-24 ENCOUNTER — Ambulatory Visit
Admission: RE | Admit: 2018-06-24 | Discharge: 2018-06-24 | Disposition: A | Discharge status: Home / Self Care | Payer: BLUE CROSS/BLUE SHIELD | Source: Ambulatory Visit | Attending: Internal Medicine | Admitting: Internal Medicine

## 2018-06-24 DIAGNOSIS — Z1231 Encounter for screening mammogram for malignant neoplasm of breast: Secondary | ICD-10-CM

## 2018-07-02 IMAGING — CT CT CHEST W/O CM
2 of 3 series · 15 of 36 positions shown, 18 images · non-contrast
Comparison: 04/08/2016 and 08/18/2014

CLINICAL DATA: Evaluate pulmonary nodule

EXAM:
CT CHEST WITHOUT CONTRAST
TECHNIQUE: Multidetector CT imaging of the chest was performed following the
standard protocol without IV contrast.

[Series 2: thorax · axial · 0.80mm/px · z∈[+1489,+1741]mm · 12 of 148 slices shown, 15 images]
[im 11/148  mediastinal]
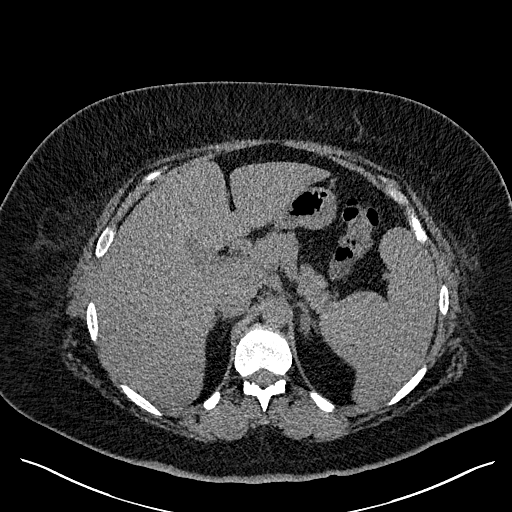
[im 11/148  lung]
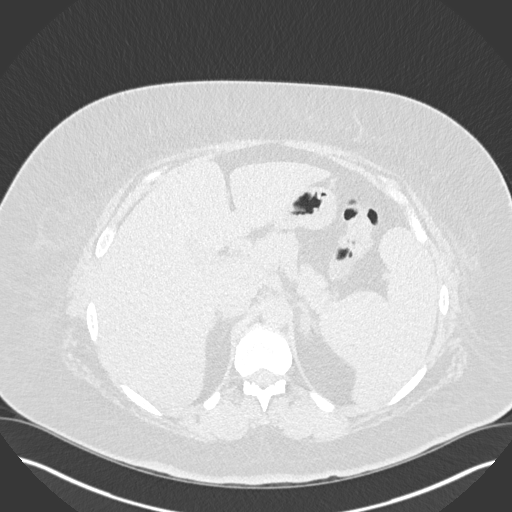
[im 22/148  lung]
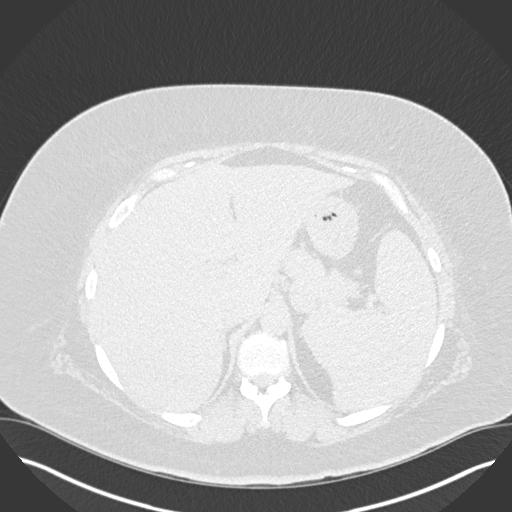
[im 33/148  lung]
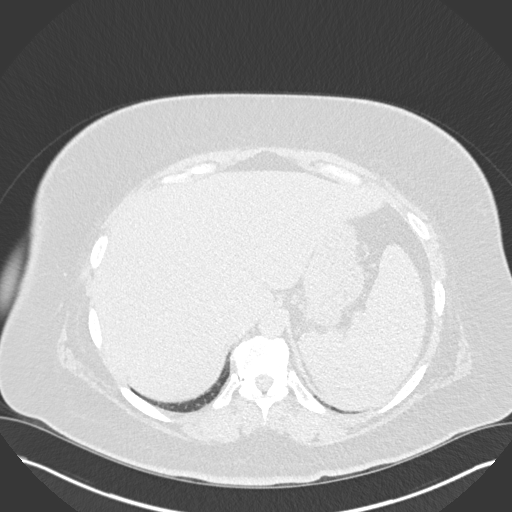
[im 44/148  lung]
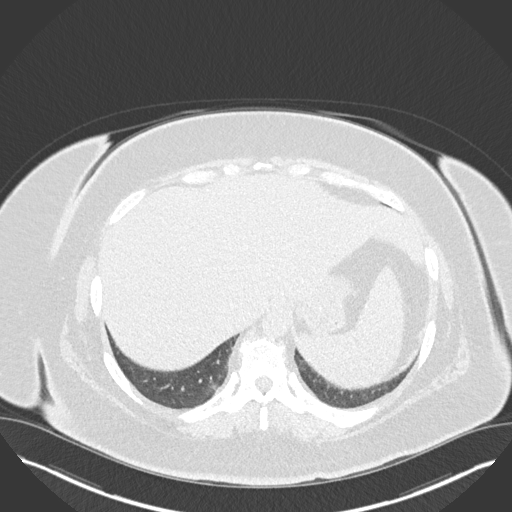
[im 55/148  mediastinal]
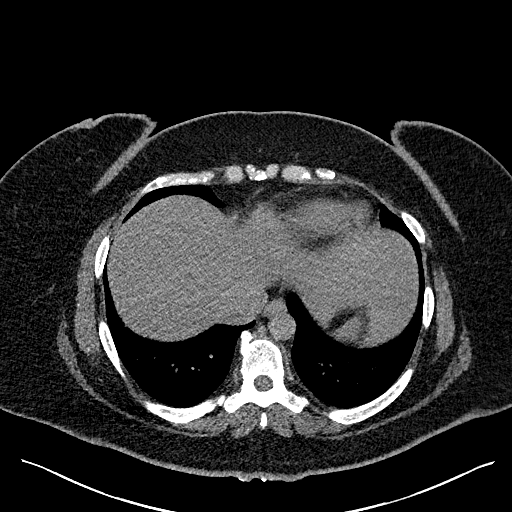
[im 55/148  lung]
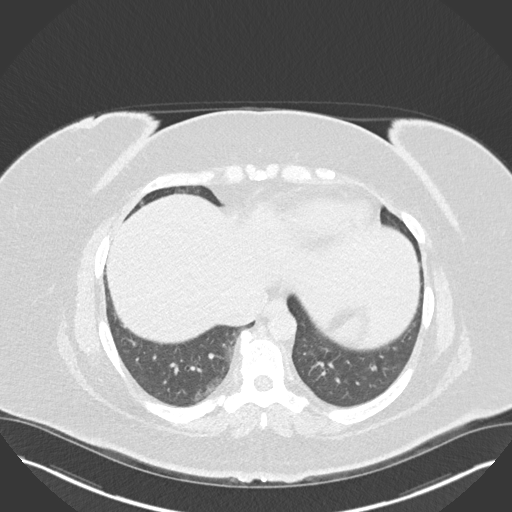
[im 66/148  lung]
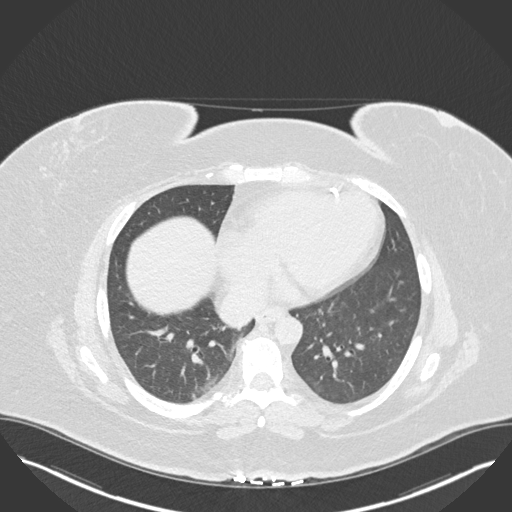
[im 82/148  lung]
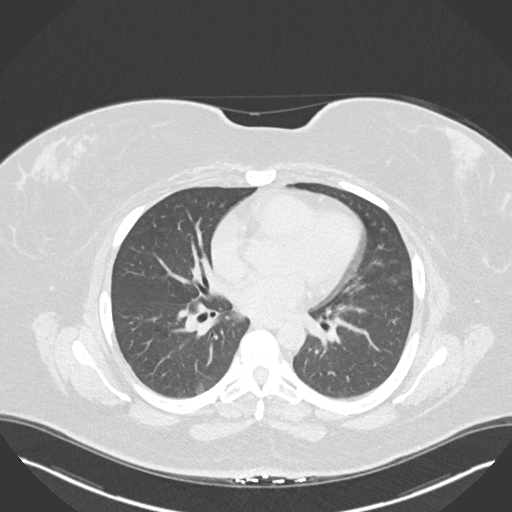
[im 93/148  lung]
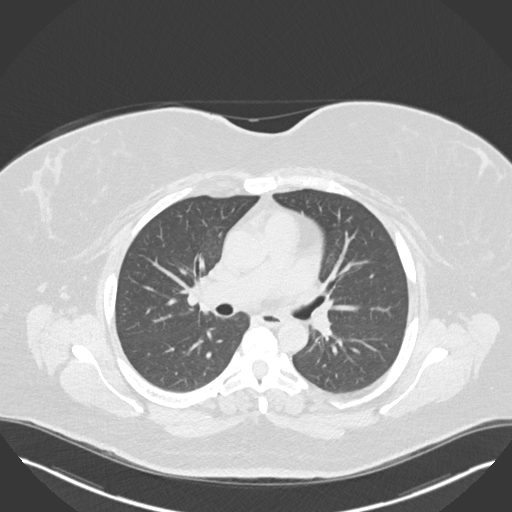
[im 104/148  mediastinal]
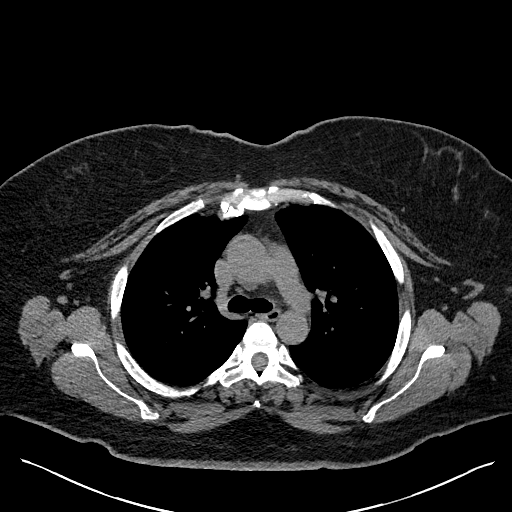
[im 104/148  lung]
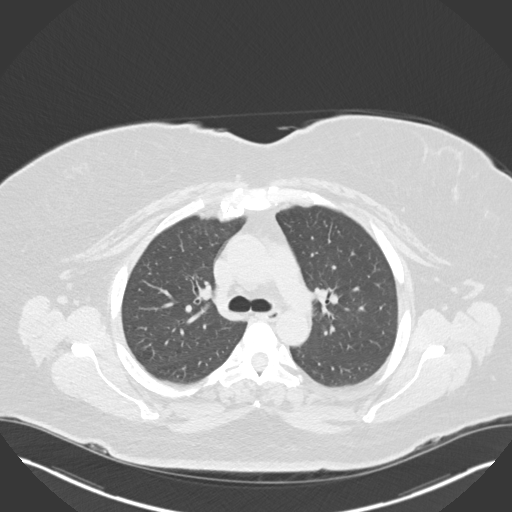
[im 115/148  lung]
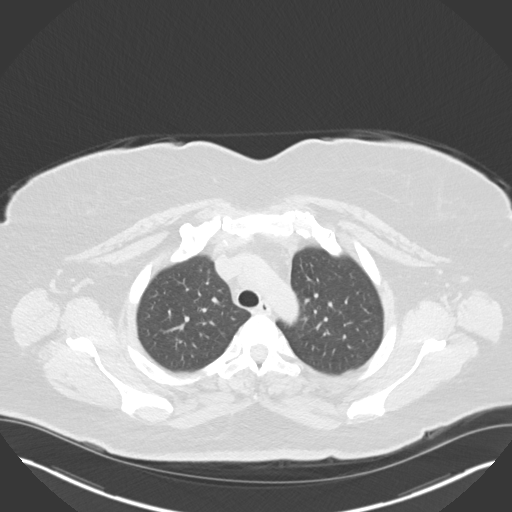
[im 126/148  lung]
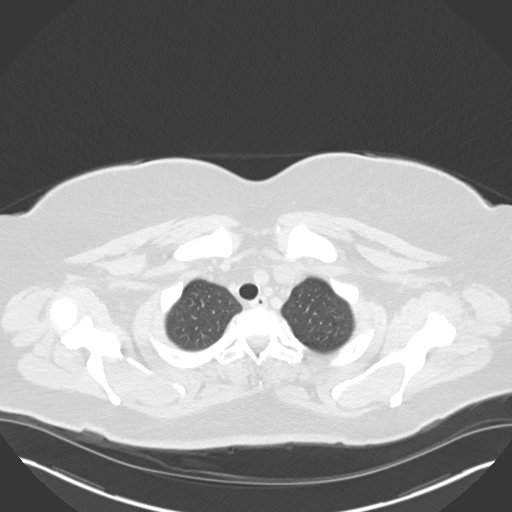
[im 137/148  lung]
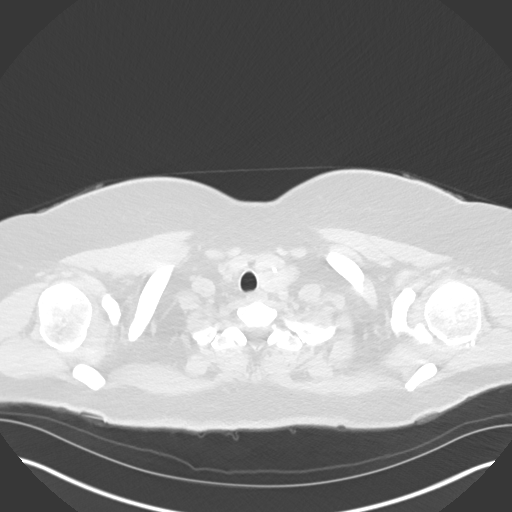

[Series 6: coronal · coronal · 0.57mm/px · 3 of 148 slices shown]
[im 30/148  lung]
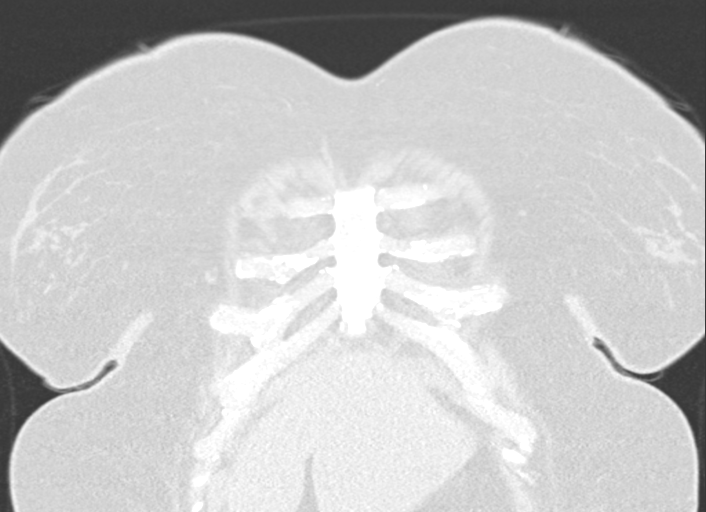
[im 59/148  lung]
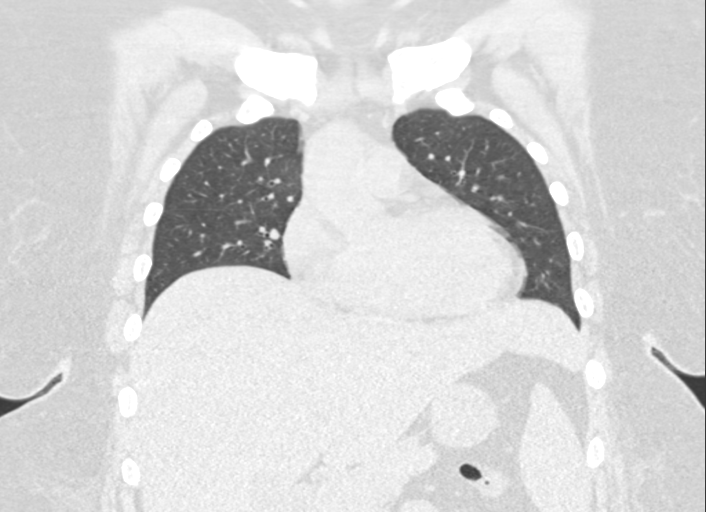
[im 89/148  lung]
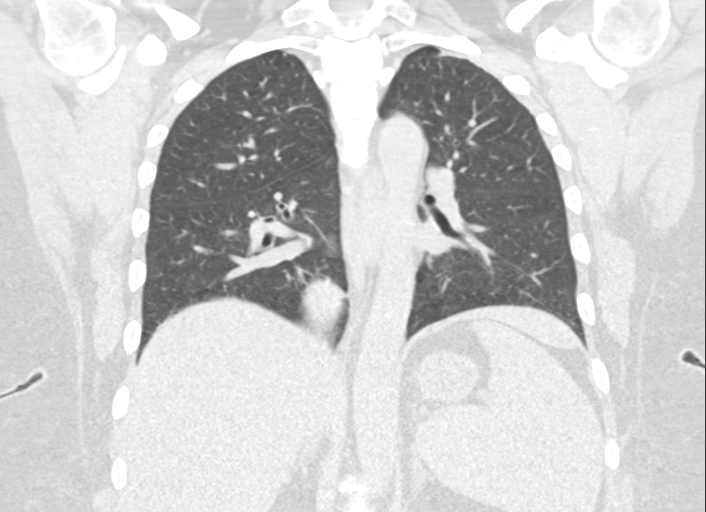

[15 of 36 positions shown; findings below may reference images not displayed]

FINDINGS: Cardiovascular: The heart size appears within normal limits. There
is no pericardial effusion identified. Calcifications within the LAD
and left circumflex coronary artery is identified.

Mediastinum/Nodes: No enlarged mediastinal or axillary lymph nodes.
2.5 cm low-attenuation nodule in the left lobe of thyroid gland is
again noted. The trachea appears patent and is midline. Normal
appearance of the esophagus.

Lungs/Pleura: No pleural effusion.Subpleural nodule within the
posterior right lower lobe measures 5 mm, image 84 of series 5. This
is remained stable since 08/18/2014 and is compatible with a benign
abnormality.

Upper Abdomen: No acute abnormality.

Musculoskeletal: No aggressive lytic or sclerotic bone lesions
identified.
IMPRESSION: 1. Stable pulmonary nodule within the posterior right lower lobe
compared with 08/18/2014 and compatible with a benign abnormality No
further follow-up of this nodule is indicated at this time. This
recommendation follows the consensus statement: Guidelines for
Management of Incidental Pulmonary Nodules Detected on CT Images:
2. Incidentally noted is a thyroid nodule involving the left lobe of
the thyroid gland measuring 2.5 cm. Consider further evaluation with
thyroid ultrasound. If patient is clinically hyperthyroid, consider
nuclear medicine thyroid uptake and scan.

## 2018-07-15 ENCOUNTER — Other Ambulatory Visit: Payer: Self-pay | Admitting: Cardiovascular Disease

## 2018-07-21 ENCOUNTER — Other Ambulatory Visit: Payer: Self-pay | Admitting: Hematology and Oncology

## 2018-07-21 DIAGNOSIS — D500 Iron deficiency anemia secondary to blood loss (chronic): Secondary | ICD-10-CM

## 2018-07-21 DIAGNOSIS — C8307 Small cell B-cell lymphoma, spleen: Secondary | ICD-10-CM

## 2018-07-23 ENCOUNTER — Telehealth: Payer: Self-pay | Admitting: Hematology and Oncology

## 2018-07-23 ENCOUNTER — Encounter: Payer: Self-pay | Admitting: Hematology and Oncology

## 2018-07-23 ENCOUNTER — Inpatient Hospital Stay: Payer: BLUE CROSS/BLUE SHIELD | Attending: Hematology and Oncology

## 2018-07-23 ENCOUNTER — Inpatient Hospital Stay: Payer: BLUE CROSS/BLUE SHIELD | Admitting: Hematology and Oncology

## 2018-07-23 ENCOUNTER — Telehealth: Payer: Self-pay | Admitting: *Deleted

## 2018-07-23 VITALS — BP 125/66 | HR 83 | Temp 98.3°F | Resp 18 | Ht 69.0 in | Wt 246.4 lb

## 2018-07-23 DIAGNOSIS — E1165 Type 2 diabetes mellitus with hyperglycemia: Secondary | ICD-10-CM

## 2018-07-23 DIAGNOSIS — Z23 Encounter for immunization: Secondary | ICD-10-CM | POA: Insufficient documentation

## 2018-07-23 DIAGNOSIS — I2510 Atherosclerotic heart disease of native coronary artery without angina pectoris: Secondary | ICD-10-CM | POA: Diagnosis not present

## 2018-07-23 DIAGNOSIS — D500 Iron deficiency anemia secondary to blood loss (chronic): Secondary | ICD-10-CM

## 2018-07-23 DIAGNOSIS — C8307 Small cell B-cell lymphoma, spleen: Secondary | ICD-10-CM | POA: Diagnosis present

## 2018-07-23 LAB — CBC WITH DIFFERENTIAL/PLATELET
BASOS ABS: 0 10*3/uL (ref 0.0–0.1)
Basophils Relative: 0 %
EOS ABS: 0.2 10*3/uL (ref 0.0–0.5)
EOS PCT: 1 %
HCT: 42.4 % (ref 36.0–46.0)
Hemoglobin: 13.4 g/dL (ref 12.0–15.0)
LYMPHS PCT: 63 %
Lymphs Abs: 10.8 10*3/uL — ABNORMAL HIGH (ref 0.7–4.0)
MCH: 24.6 pg — ABNORMAL LOW (ref 26.0–34.0)
MCHC: 31.6 g/dL (ref 30.0–36.0)
MCV: 77.9 fL — AB (ref 80.0–100.0)
Monocytes Absolute: 0.7 10*3/uL (ref 0.1–1.0)
Monocytes Relative: 4 %
NEUTROS PCT: 31 %
NRBC: 0 % (ref 0.0–0.2)
Neutro Abs: 5.2 10*3/uL (ref 1.7–7.7)
PLATELETS: 177 10*3/uL (ref 150–400)
RBC: 5.44 MIL/uL — AB (ref 3.87–5.11)
RDW: 14.3 % (ref 11.5–15.5)
WBC: 17 10*3/uL — AB (ref 4.0–10.5)

## 2018-07-23 LAB — COMPREHENSIVE METABOLIC PANEL
ALBUMIN: 3.8 g/dL (ref 3.5–5.0)
ALT: 12 U/L (ref 0–44)
AST: 9 U/L — ABNORMAL LOW (ref 15–41)
Alkaline Phosphatase: 120 U/L (ref 38–126)
Anion gap: 10 (ref 5–15)
BUN: 12 mg/dL (ref 6–20)
CHLORIDE: 100 mmol/L (ref 98–111)
CO2: 30 mmol/L (ref 22–32)
CREATININE: 0.81 mg/dL (ref 0.44–1.00)
Calcium: 9.4 mg/dL (ref 8.9–10.3)
GFR calc Af Amer: >60 mL/min (ref >60)
GLUCOSE: 310 mg/dL — AB (ref 70–99)
POTASSIUM: 4.2 mmol/L (ref 3.5–5.1)
SODIUM: 140 mmol/L (ref 135–145)
Total Bilirubin: 0.5 mg/dL (ref 0.3–1.2)
Total Protein: 7.1 g/dL (ref 6.5–8.1)

## 2018-07-23 LAB — IRON AND TIBC
Iron: 38 ug/dL — ABNORMAL LOW (ref 41–142)
Saturation Ratios: 13 % — ABNORMAL LOW (ref 21–57)
TIBC: 285 ug/dL (ref 236–444)
UIBC: 247 ug/dL

## 2018-07-23 LAB — LACTATE DEHYDROGENASE: LDH: 228 U/L — AB (ref 98–192)

## 2018-07-23 LAB — FERRITIN: Ferritin: 129 ng/mL (ref 11–307)

## 2018-07-23 MED ORDER — INFLUENZA VAC SPLIT QUAD 0.5 ML IM SUSY
0.5000 mL | PREFILLED_SYRINGE | Freq: Once | INTRAMUSCULAR | Status: AC
  Administered 2018-07-23: 0.5 mL via INTRAMUSCULAR

## 2018-07-23 MED ORDER — INFLUENZA VAC SPLIT QUAD 0.5 ML IM SUSY
PREFILLED_SYRINGE | INTRAMUSCULAR | Status: AC
  Filled 2018-07-23: qty 0.5

## 2018-07-23 NOTE — Assessment & Plan Note (Addendum)
She has lost some weight with dietary modification and increased exercise activity She is highly motivated to get better I congratulated her efforts of losing weight recently Her blood sugar is quite high.  I recommend close follow-up with primary care doctor for medical management

## 2018-07-23 NOTE — Telephone Encounter (Signed)
Gave pt avs and calendar  °

## 2018-07-23 NOTE — Telephone Encounter (Signed)
-----   Message from Heath Lark, MD sent at 07/23/2018  9:32 AM EDT ----- Regarding: high blood glucose Let her know blood glucose is very high She needs to drink more fluids and consult with primary doctor for management

## 2018-07-23 NOTE — Assessment & Plan Note (Signed)
She has mild progressive lymphocytosis but remained asymptomatic She will be monitored carefully I plan to see her again in 3 months with history, physical examination and blood work  We discussed the importance of preventive care and reviewed the vaccination programs. She does not have any prior allergic reactions to influenza vaccination. She agrees to proceed with influenza vaccination today and we will administer it today at the clinic.

## 2018-07-23 NOTE — Assessment & Plan Note (Signed)
She is on aggressive risk factors modification She has recently stopped taking Brilinta because she attributed that to causing hair loss I told her not to discontinue medication without consulting with her cardiologist due to high risk of coronary artery disease.

## 2018-07-23 NOTE — Progress Notes (Signed)
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 She does not have any prior allergic reactions to influenza vaccination. She agrees to proceed with influenza vaccination today and we will administer it today at the clinic.   Diabetes mellitus type 2, uncontrolled ([place]) She has lost some weight with dietary modification and increased exercise activity She is highly motivated to get better I congratulated her efforts of losing weight recently Her blood sugar is quite high.  I recommend close follow-up with primary care doctor for medical management   CAD in native artery She is on aggressive risk factors modification She has recently stopped taking Brilinta because she attributed that to causing hair loss I told her not to discontinue medication without consulting with her cardiologist due to high risk of coronary artery disease.   No orders of the defined types were placed in this encounter.   INTERVAL HISTORY: Please see below for problem oriented charting. She returns for further follow-up She denies recent infection, fever or chills She has no new lymphadenopathy Energy level is fair On review of her medication list, she has stopped taking Brilinta because she attributed that to causing hair loss and bruising The patient denies any recent signs or symptoms of bleeding such as spontaneous epistaxis, hematuria or hematochezia. Denies abnormal night sweats  SUMMARY OF ONCOLOGIC HISTORY:   Splenic marginal  zone b-cell lymphoma ([place])   01/11/2009 Initial Diagnosis    Splenic marginal zone b-cell lymphoma    06/06/2014 Bone Marrow Biopsy    Bone marrow aspirate and biopsy confirmed extensive involvement by CD20 positive non-Hodgkin's lymphoma.    06/07/2014 Imaging    She had a PET scan which showed predominant splenic involvement.    06/16/2014 - 07/07/2014 Chemotherapy    She started on weekly rituximab.    06/16/2014 Adverse Reaction    She had infusion reaction, resolved with IV dexamethasone.    08/18/2014 Imaging    PET CT scan show resolution of hypermetabolic activity.    04/08/2016 Imaging    1. No evidence for aortic dissection or vascular injury. 2. Mild atherosclerotic changes within the abdominal aorta and branch vessels without aneurysm. 3. No significant adenopathy or evidence for recurrent lymphoma. 4. Stable mild splenomegaly. 5. 5 mm nodule in the right lower lobe demonstrates interval increase in size. No follow-up needed if patient is low-risk. Non-contrast chest CT can be considered in 12 months if patient is high-risk.  6. Degenerate changes at the SI joints bilaterally.    01/21/2018 Imaging    1. Persistent mild splenomegaly, similar to prior examinations. No other findings to suggest recurrent disease in the chest, abdomen or pelvis. 2. Aortic atherosclerosis, in addition to two vessel coronary artery disease. Please note that although the presence of coronary artery calcium documents the presence of coronary artery disease, the severity of this disease and any potential stenosis cannot be assessed on this non-gated CT examination. Assessment for potential risk factor modification, dietary therapy or pharmacologic therapy may be warranted, if clinically indicated.  3. Enlarging heterogeneously enhancing and partially calcified left  thyroid lobe nodule. Further evaluation with nonemergent thyroid ultrasound is recommended in the near future to better evaluate this lesion and  determine if there is a need for fine-needle aspiration. 4. Additional incidental findings, as above.     REVIEW OF SYSTEMS:   Constitutional: Denies fevers, chills or abnormal weight loss Eyes: Denies blurriness of vision Ears, nose, mouth, throat, and face: Denies mucositis or sore throat Respiratory: Denies cough, dyspnea or wheezes Cardiovascular: Denies palpitation, chest discomfort or lower extremity swelling Gastrointestinal:  Denies nausea, heartburn or change in bowel habits Skin: Denies abnormal skin rashes Lymphatics: Denies new lymphadenopathy or easy bruising Neurological:Denies numbness, tingling or new weaknesses Behavioral/Psych: Mood is stable, no new changes  All other systems were reviewed with the patient and are negative.  I have reviewed the past medical history, past surgical history, social history and family history with the patient and they are unchanged from previous note.  ALLERGIES:  is allergic to erythromycin; lovastatin; and tolnaftate.  MEDICATIONS:  Current Outpatient Medications  Medication Sig Dispense Refill  . acetaminophen (TYLENOL) 500 MG tablet Take 1 tablet (500 mg total) by mouth every 8 (eight) hours as needed (pain). 30 tablet 0  . aspirin 81 MG chewable tablet Chew 1 tablet (81 mg total) by mouth daily. 30 tablet 0  . atorvastatin (LIPITOR) 80 MG tablet Take 1 tablet (80 mg total) by mouth daily at 6 PM. 30 tablet 12  . BRILINTA 90 MG TABS tablet TAKE 1 TABLET BY MOUTH TWICE A DAY 60 tablet 4  . fish oil-omega-3 fatty acids 1000 MG capsule Take 1 g by mouth 2 (two) times daily.     . furosemide (LASIX) 20 MG tablet Take 1 tablet (20 mg total) by mouth daily. 30 tablet 3  . insulin aspart (NOVOLOG FLEXPEN) 100 UNIT/ML FlexPen Before each meal 3 times a day, 140-199 - 2 units, 200-250 - 4 units, 251-299 - 6 units,  300-349 - 8 units,  350 or above 10 units. Insulin PEN if approved, provide syringes and needles if needed. (Patient not taking:  Reported on 02/24/2018) 15 mL 1  . losartan (COZAAR) 100 MG tablet Take 100 mg by mouth daily.    . metFORMIN (GLUCOPHAGE) 1000 MG tablet Take 1,000 mg by mouth 2 (two) times daily with a meal.      . metoprolol tartrate (LOPRESSOR) 100 MG tablet Take 1 tablet (100 mg total) by mouth 2 (two) times daily. PT OVERDUE FOR OV PLEASE CALL FOR APPT 60 tablet 0  . nitroGLYCERIN (NITROSTAT) 0.4 MG SL tablet Place 1 tablet (0.4 mg total) under the tongue every 5 (five) minutes as needed for chest pain. 30 tablet 0  . pantoprazole (PROTONIX) 40 MG tablet Take 1 tablet (40 mg total) by mouth daily. 30 tablet 12  . Potassium Chloride ER 20 MEQ TBCR Take 20 mEq by mouth daily.     . Prenatal Vit-Fe Fumarate-FA (MULTIVITAMIN-PRENATAL) 27-0.8 MG TABS tablet Take 2 tablets by mouth daily at 12 noon.    . ticagrelor (BRILINTA) 90 MG TABS tablet Take 1 tablet (90 mg total) by mouth 2 (two) times daily. 90 tablet 2  . valACYclovir (VALTREX) 500 MG tablet Take 500 mg by mouth 2 (two) times daily as needed. Takes for outbreaks only.     No current facility-administered medications for this visit.     PHYSICAL EXAMINATION: ECOG PERFORMANCE STATUS: 1 - Symptomatic but completely ambulatory  Vitals:   07/23/18 0823  BP: 125/66  Pulse: 83  Resp: 18  Temp: 98.3 F (36.8 C)  SpO2: 100%   Filed Weights   07/23/18 0823  Weight: 246 lb 6.4 oz (111.8 kg)    GENERAL:alert, no distress and comfortable SKIN: skin color, texture, turgor are normal, no rashes or significant lesions EYES: normal, Conjunctiva are pink and non-injected, sclera clear OROPHARYNX:no exudate, no erythema and lips, buccal mucosa, and tongue normal  NECK: supple, thyroid normal size, non-tender, without nodularity LYMPH:  no palpable lymphadenopathy in the cervical, axillary or inguinal LUNGS: clear to auscultation and percussion with normal breathing effort HEART: regular rate & rhythm and no murmurs and no lower extremity  edema ABDOMEN:abdomen soft, non-tender and normal bowel sounds Musculoskeletal:no cyanosis of digits and no clubbing  NEURO: alert & oriented x 3 with fluent speech, no focal motor/sensory deficits  LABORATORY DATA:  I have reviewed the data as listed    Component Value Date/Time   NA 140 07/23/2018 0804   NA 138 07/24/2017 1105   K 4.2 07/23/2018 0804   K 4.2 07/24/2017 1105   CL 100 07/23/2018 0804   CL 106 11/19/2012 0837   CO2 30 07/23/2018 0804   CO2 26 07/24/2017 1105   GLUCOSE 310 (H) 07/23/2018 0804   GLUCOSE 362 (H) 07/24/2017 1105   GLUCOSE 107 (H) 11/19/2012 0837   BUN 12 07/23/2018 0804   BUN 10.0 07/24/2017 1105   CREATININE 0.81 07/23/2018 0804   CREATININE 0.8 07/24/2017 1105   CALCIUM 9.4 07/23/2018 0804   CALCIUM 8.8 07/24/2017 1105   PROT 7.1 07/23/2018 0804   PROT 6.3 (L) 07/24/2017 1105   ALBUMIN 3.8 07/23/2018 0804   ALBUMIN 3.4 (L) 07/24/2017 1105   AST 9 (L) 07/23/2018 0804   AST 13 07/24/2017 1105   ALT 12 07/23/2018 0804   ALT 21 07/24/2017 1105   ALKPHOS 120 07/23/2018 0804   ALKPHOS 112 07/24/2017 1105   BILITOT 0.5 07/23/2018 0804   BILITOT 0.55 07/24/2017 1105   GFRNONAA >60 07/23/2018 0804   GFRAA >60 07/23/2018 0804    No results found for: SPEP, UPEP  Lab Results  Component Value Date   WBC 17.0 (H) 07/23/2018   NEUTROABS PENDING 07/23/2018   HGB 13.4 07/23/2018   HCT 42.4 07/23/2018   MCV 77.9 (L) 07/23/2018   PLT 177 07/23/2018      Chemistry      Component Value Date/Time   NA 140 07/23/2018 0804   NA 138 07/24/2017 1105   K 4.2 07/23/2018 0804   K 4.2 07/24/2017 1105   CL 100 07/23/2018 0804   CL 106 11/19/2012 0837   CO2 30 07/23/2018 0804   CO2 26 07/24/2017 1105   BUN 12 07/23/2018 0804   BUN 10.0 07/24/2017 1105   CREATININE 0.81 07/23/2018 0804   CREATININE 0.8 07/24/2017 1105      Component Value Date/Time   CALCIUM 9.4 07/23/2018 0804   CALCIUM 8.8 07/24/2017 1105   ALKPHOS 120 07/23/2018 0804    ALKPHOS 112 07/24/2017 1105   AST 9 (L) 07/23/2018 0804   AST 13 07/24/2017 1105   ALT 12 07/23/2018 0804   ALT 21 07/24/2017 1105   BILITOT 0.5 07/23/2018 0804   BILITOT 0.55 07/24/2017 1105       RADIOGRAPHIC STUDIES: I have personally reviewed the radiological images as listed and agreed with the findings in the report. Mm Digital Screening Bilateral  Result Date: 06/24/2018 CLINICAL DATA:  Screening. EXAM: DIGITAL SCREENING BILATERAL MAMMOGRAM WITH CAD COMPARISON:  Previous exam(s).  ACR Breast Density Category b: There are scattered areas of fibroglandular density. FINDINGS: There are no findings suspicious for malignancy. Images were processed with CAD. IMPRESSION: No mammographic evidence of malignancy. A result letter of this screening mammogram will be mailed directly to the patient. RECOMMENDATION: Screening mammogram in one year. (Code:[id number]-[id number]) BI-RADS CATEGORY  1: Negative. Electronically Signed   By: Everlean Alstrom M.D.   On: 06/24/2018 07:58    All questions were answered. The patient knows to call the clinic with any problems, questions or concerns. No barriers to learning was detected.  I spent 15 minutes counseling the patient face to face. The total time spent in the appointment was 20 minutes and more than 50% was on counseling and review of test results  Heath Lark, MD 07/23/2018 10:13 AM

## 2018-07-23 NOTE — Telephone Encounter (Signed)
Telephone call to patient to advise lab result. She will contact her PCP to discuss management.

## 2018-10-21 ENCOUNTER — Other Ambulatory Visit: Payer: Self-pay

## 2018-10-21 DIAGNOSIS — C8307 Small cell B-cell lymphoma, spleen: Secondary | ICD-10-CM

## 2018-10-22 ENCOUNTER — Telehealth: Payer: Self-pay | Admitting: Hematology and Oncology

## 2018-10-22 ENCOUNTER — Inpatient Hospital Stay: Payer: BLUE CROSS/BLUE SHIELD | Attending: Hematology and Oncology

## 2018-10-22 ENCOUNTER — Inpatient Hospital Stay: Payer: BLUE CROSS/BLUE SHIELD | Admitting: Hematology and Oncology

## 2018-10-22 ENCOUNTER — Encounter: Payer: Self-pay | Admitting: Hematology and Oncology

## 2018-10-22 VITALS — BP 114/74 | HR 87 | Temp 98.1°F | Resp 18 | Ht 69.0 in | Wt 249.8 lb

## 2018-10-22 DIAGNOSIS — Z9221 Personal history of antineoplastic chemotherapy: Secondary | ICD-10-CM | POA: Insufficient documentation

## 2018-10-22 DIAGNOSIS — Z79899 Other long term (current) drug therapy: Secondary | ICD-10-CM | POA: Insufficient documentation

## 2018-10-22 DIAGNOSIS — C8307 Small cell B-cell lymphoma, spleen: Secondary | ICD-10-CM

## 2018-10-22 DIAGNOSIS — Z7984 Long term (current) use of oral hypoglycemic drugs: Secondary | ICD-10-CM | POA: Diagnosis not present

## 2018-10-22 DIAGNOSIS — E1165 Type 2 diabetes mellitus with hyperglycemia: Secondary | ICD-10-CM | POA: Insufficient documentation

## 2018-10-22 DIAGNOSIS — D630 Anemia in neoplastic disease: Secondary | ICD-10-CM

## 2018-10-22 DIAGNOSIS — Z7982 Long term (current) use of aspirin: Secondary | ICD-10-CM | POA: Insufficient documentation

## 2018-10-22 DIAGNOSIS — R635 Abnormal weight gain: Secondary | ICD-10-CM | POA: Diagnosis not present

## 2018-10-22 LAB — CBC WITH DIFFERENTIAL (CANCER CENTER ONLY)
Abs Immature Granulocytes: 0 10*3/uL (ref 0.00–0.07)
Basophils Absolute: 0.2 10*3/uL — ABNORMAL HIGH (ref 0.0–0.1)
Basophils Relative: 1 %
EOS PCT: 2 %
Eosinophils Absolute: 0.3 10*3/uL (ref 0.0–0.5)
HCT: 43 % (ref 36.0–46.0)
Hemoglobin: 13.6 g/dL (ref 12.0–15.0)
Lymphocytes Relative: 74 %
Lymphs Abs: 12 10*3/uL — ABNORMAL HIGH (ref 0.7–4.0)
MCH: 24.2 pg — ABNORMAL LOW (ref 26.0–34.0)
MCHC: 31.6 g/dL (ref 30.0–36.0)
MCV: 76.4 fL — ABNORMAL LOW (ref 80.0–100.0)
Monocytes Absolute: 1 10*3/uL (ref 0.1–1.0)
Monocytes Relative: 6 %
Neutro Abs: 2.8 10*3/uL (ref 1.7–17.7)
Neutrophils Relative %: 17 %
Platelet Count: 173 10*3/uL (ref 150–400)
RBC: 5.63 MIL/uL — ABNORMAL HIGH (ref 3.87–5.11)
RDW: 14.1 % (ref 11.5–15.5)
WBC Count: 16.2 10*3/uL — ABNORMAL HIGH (ref 4.0–10.5)
nRBC: 0 % (ref 0.0–0.2)

## 2018-10-22 LAB — CMP (CANCER CENTER ONLY)
ALT: 16 U/L (ref 0–44)
AST: 11 U/L — ABNORMAL LOW (ref 15–41)
Albumin: 3.7 g/dL (ref 3.5–5.0)
Alkaline Phosphatase: 109 U/L (ref 38–126)
Anion gap: 11 (ref 5–15)
BUN: 13 mg/dL (ref 6–20)
CO2: 28 mmol/L (ref 22–32)
Calcium: 9.2 mg/dL (ref 8.9–10.3)
Chloride: 101 mmol/L (ref 98–111)
Creatinine: 0.91 mg/dL (ref 0.44–1.00)
GFR, Est AFR Am: >60 mL/min (ref >60)
GFR, Estimated: >60 mL/min (ref >60)
GLUCOSE: 243 mg/dL — AB (ref 70–99)
Potassium: 3.9 mmol/L (ref 3.5–5.1)
Sodium: 140 mmol/L (ref 135–145)
Total Bilirubin: 0.7 mg/dL (ref 0.3–1.2)
Total Protein: 7 g/dL (ref 6.5–8.1)

## 2018-10-22 LAB — LACTATE DEHYDROGENASE: LDH: 227 U/L — ABNORMAL HIGH (ref 98–192)

## 2018-10-22 NOTE — Telephone Encounter (Signed)
Gave avs and calendar ° °

## 2018-10-22 NOTE — Assessment & Plan Note (Signed)
She has mild progressive lymphocytosis but remained asymptomatic She will be monitored carefully I plan to see her again in 3 months with history, physical examination and blood work

## 2018-10-22 NOTE — Progress Notes (Signed)
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  I recommend close follow-up with primary care doctor for medical management We had extensive discussion about weight loss strategy. The patient has made a commitment to lose 5 pounds before I see her in 3 months   Anemia in neoplastic disease This is likely anemia of chronic disease. The patient denies recent history of bleeding such as epistaxis, hematuria or hematochezia. She is asymptomatic from the anemia. We will observe for now.   Orders Placed This Encounter  Procedures  . Comprehensive metabolic panel    Standing Status:   Future    Standing Expiration Date:   11/26/2019  . CBC with Differential/Platelet    Standing Status:   Future    Standing Expiration Date:   11/26/2019    INTERVAL HISTORY: Please see below for problem oriented charting. She returns for further follow-up She feels well No recent infection, fever or chills No new lymphadenopathy She has gained some weight due to dietary noncompliance over the holidays She denies recent exacerbation of heart disease such as heart failure, chest pain or shortness of breath  SUMMARY OF ONCOLOGIC HISTORY:   Splenic marginal zone b-cell lymphoma ([place])   01/11/2009 Initial Diagnosis    Splenic marginal zone b-cell lymphoma    06/06/2014 Bone Marrow Biopsy    Bone marrow aspirate and biopsy confirmed  extensive involvement by CD20 positive non-Hodgkin's lymphoma.    06/07/2014 Imaging    She had a PET scan which showed predominant splenic involvement.    06/16/2014 - 07/07/2014 Chemotherapy    She started on weekly rituximab.    06/16/2014 Adverse Reaction    She had infusion reaction, resolved with IV dexamethasone.    08/18/2014 Imaging    PET CT scan show resolution of hypermetabolic activity.    04/08/2016 Imaging    1. No evidence for aortic dissection or vascular injury. 2. Mild atherosclerotic changes within the abdominal aorta and branch vessels without aneurysm. 3. No significant adenopathy or evidence for recurrent lymphoma. 4. Stable mild splenomegaly. 5. 5 mm nodule in the right lower lobe demonstrates interval increase in size. No follow-up needed if patient is low-risk. Non-contrast chest CT can be considered in 12 months if patient is high-risk.  6. Degenerate changes at the SI joints bilaterally.    01/21/2018 Imaging    1. Persistent mild splenomegaly, similar to prior examinations. No other findings to suggest recurrent disease in the chest, abdomen or pelvis. 2. Aortic atherosclerosis, in addition to two vessel coronary artery disease. Please note that although the presence of coronary artery calcium documents the presence of coronary artery disease, the severity of this disease and any potential stenosis cannot be assessed on this non-gated CT examination. Assessment for potential risk factor modification, dietary therapy or pharmacologic therapy may be warranted, if clinically indicated.  3. Enlarging heterogeneously enhancing and partially calcified left thyroid lobe nodule. Further evaluation with nonemergent thyroid ultrasound is recommended  in the near future to better evaluate this lesion and determine if there is a need for fine-needle aspiration. 4. Additional incidental findings, as above.     REVIEW OF SYSTEMS:   Constitutional: Denies fevers, chills or abnormal  weight loss Eyes: Denies blurriness of vision Ears, nose, mouth, throat, and face: Denies mucositis or sore throat Respiratory: Denies cough, dyspnea or wheezes Cardiovascular: Denies palpitation, chest discomfort or lower extremity swelling Gastrointestinal:  Denies nausea, heartburn or change in bowel habits Skin: Denies abnormal skin rashes Lymphatics: Denies new lymphadenopathy or easy bruising Neurological:Denies numbness, tingling or new weaknesses Behavioral/Psych: Mood is stable, no new changes  All other systems were reviewed with the patient and are negative.  I have reviewed the past medical history, past surgical history, social history and family history with the patient and they are unchanged from previous note.  ALLERGIES:  is allergic to erythromycin; lovastatin; and tolnaftate.  MEDICATIONS:  Current Outpatient Medications  Medication Sig Dispense Refill  . acetaminophen (TYLENOL) 500 MG tablet Take 1 tablet (500 mg total) by mouth every 8 (eight) hours as needed (pain). 30 tablet 0  . aspirin 81 MG chewable tablet Chew 1 tablet (81 mg total) by mouth daily. 30 tablet 0  . atorvastatin (LIPITOR) 80 MG tablet Take 1 tablet (80 mg total) by mouth daily at 6 PM. 30 tablet 12  . BRILINTA 90 MG TABS tablet TAKE 1 TABLET BY MOUTH TWICE A DAY 60 tablet 4  . fish oil-omega-3 fatty acids 1000 MG capsule Take 1 g by mouth 2 (two) times daily.     . furosemide (LASIX) 20 MG tablet Take 1 tablet (20 mg total) by mouth daily. 30 tablet 3  . insulin aspart (NOVOLOG FLEXPEN) 100 UNIT/ML FlexPen Before each meal 3 times a day, 140-199 - 2 units, 200-250 - 4 units, 251-299 - 6 units,  300-349 - 8 units,  350 or above 10 units. Insulin PEN if approved, provide syringes and needles if needed. (Patient not taking: Reported on 02/24/2018) 15 mL 1  . losartan (COZAAR) 100 MG tablet Take 100 mg by mouth daily.    . metFORMIN (GLUCOPHAGE) 1000 MG tablet Take 1,000 mg by mouth 2 (two) times daily  with a meal.      . metoprolol tartrate (LOPRESSOR) 100 MG tablet Take 1 tablet (100 mg total) by mouth 2 (two) times daily. PT OVERDUE FOR OV PLEASE CALL FOR APPT 60 tablet 0  . nitroGLYCERIN (NITROSTAT) 0.4 MG SL tablet Place 1 tablet (0.4 mg total) under the tongue every 5 (five) minutes as needed for chest pain. 30 tablet 0  . pantoprazole (PROTONIX) 40 MG tablet Take 1 tablet (40 mg total) by mouth daily. 30 tablet 12  . Potassium Chloride ER 20 MEQ TBCR Take 20 mEq by mouth daily.     . Prenatal Vit-Fe Fumarate-FA (MULTIVITAMIN-PRENATAL) 27-0.8 MG TABS tablet Take 2 tablets by mouth daily at 12 noon.    . ticagrelor (BRILINTA) 90 MG TABS tablet Take 1 tablet (90 mg total) by mouth 2 (two) times daily. 90 tablet 2  . valACYclovir (VALTREX) 500 MG tablet Take 500 mg by mouth 2 (two) times daily as needed. Takes for outbreaks only.     No current facility-administered medications for this visit.     PHYSICAL EXAMINATION: ECOG PERFORMANCE STATUS: 1 - Symptomatic but completely ambulatory  Vitals:   10/22/18 0835  BP: 114/74  Pulse: 87  Resp: 18  Temp: 98.1 F (36.7 C)  SpO2:  100%   Filed Weights   10/22/18 0835  Weight: 249 lb 12.8 oz (113.3 kg)    GENERAL:alert, no distress and comfortable SKIN: skin color, texture, turgor are normal, no rashes or significant lesions EYES: normal, Conjunctiva are pink and non-injected, sclera clear OROPHARYNX:no exudate, no erythema and lips, buccal mucosa, and tongue normal  NECK: supple, thyroid normal size, non-tender, without nodularity LYMPH:  no palpable lymphadenopathy in the cervical, axillary or inguinal LUNGS: clear to auscultation and percussion with normal breathing effort HEART: regular rate & rhythm and no murmurs and no lower extremity edema ABDOMEN:abdomen soft, non-tender and normal bowel sounds Musculoskeletal:no cyanosis of digits and no clubbing  NEURO: alert & oriented x 3 with fluent speech, no focal motor/sensory  deficits  LABORATORY DATA:  I have reviewed the data as listed    Component Value Date/Time   NA 140 10/22/2018 0809   NA 138 07/24/2017 1105   K 3.9 10/22/2018 0809   K 4.2 07/24/2017 1105   CL 101 10/22/2018 0809   CL 106 11/19/2012 0837   CO2 28 10/22/2018 0809   CO2 26 07/24/2017 1105   GLUCOSE 243 (H) 10/22/2018 0809   GLUCOSE 362 (H) 07/24/2017 1105   GLUCOSE 107 (H) 11/19/2012 0837   BUN 13 10/22/2018 0809   BUN 10.0 07/24/2017 1105   CREATININE 0.91 10/22/2018 0809   CREATININE 0.8 07/24/2017 1105   CALCIUM 9.2 10/22/2018 0809   CALCIUM 8.8 07/24/2017 1105   PROT 7.0 10/22/2018 0809   PROT 6.3 (L) 07/24/2017 1105   ALBUMIN 3.7 10/22/2018 0809   ALBUMIN 3.4 (L) 07/24/2017 1105   AST 11 (L) 10/22/2018 0809   AST 13 07/24/2017 1105   ALT 16 10/22/2018 0809   ALT 21 07/24/2017 1105   ALKPHOS 109 10/22/2018 0809   ALKPHOS 112 07/24/2017 1105   BILITOT 0.7 10/22/2018 0809   BILITOT 0.55 07/24/2017 1105   GFRNONAA >60 10/22/2018 0809   GFRAA >60 10/22/2018 0809    No results found for: SPEP, UPEP  Lab Results  Component Value Date   WBC 16.2 (H) 10/22/2018   NEUTROABS 2.8 10/22/2018   HGB 13.6 10/22/2018   HCT 43.0 10/22/2018   MCV 76.4 (L) 10/22/2018   PLT 173 10/22/2018      Chemistry      Component Value Date/Time   NA 140 10/22/2018 0809   NA 138 07/24/2017 1105   K 3.9 10/22/2018 0809   K 4.2 07/24/2017 1105   CL 101 10/22/2018 0809   CL 106 11/19/2012 0837   CO2 28 10/22/2018 0809   CO2 26 07/24/2017 1105   BUN 13 10/22/2018 0809   BUN 10.0 07/24/2017 1105   CREATININE 0.91 10/22/2018 0809   CREATININE 0.8 07/24/2017 1105      Component Value Date/Time   CALCIUM 9.2 10/22/2018 0809   CALCIUM 8.8 07/24/2017 1105   ALKPHOS 109 10/22/2018 0809   ALKPHOS 112 07/24/2017 1105   AST 11 (L) 10/22/2018 0809   AST 13 07/24/2017 1105   ALT 16 10/22/2018 0809   ALT 21 07/24/2017 1105   BILITOT 0.7 10/22/2018 0809   BILITOT 0.55 07/24/2017 1105        All questions were answered. The patient knows to call the clinic with any problems, questions or concerns. No barriers to learning was detected.  I spent 15 minutes counseling the patient face to face. The total time spent in the appointment was 20 minutes and more than 50% was on counseling and  review of test results  Heath Lark, MD 10/22/2018 10:09 AM

## 2018-10-22 NOTE — Assessment & Plan Note (Signed)
This is likely anemia of chronic disease. The patient denies recent history of bleeding such as epistaxis, hematuria or hematochezia. She is asymptomatic from the anemia. We will observe for now.  

## 2018-10-22 NOTE — Assessment & Plan Note (Signed)
She has gained some weight due to dietary noncompliance over the holidays She is highly motivated to get better Her blood sugar is quite high.  I recommend close follow-up with primary care doctor for medical management We had extensive discussion about weight loss strategy. The patient has made a commitment to lose 5 pounds before I see her in 3 months

## 2018-11-13 IMAGING — US US THYROID
1 series · 13 of 25 positions shown · non-contrast
Comparison: CT scan of the chest 01/20/2018

CLINICAL DATA: Incidental on CT. 52-year-old female with a
left-sided thyroid nodule noticed on prior CT scan of the chest

EXAM:
THYROID ULTRASOUND
TECHNIQUE: Ultrasound examination of the thyroid gland and adjacent soft
tissues was performed.

[Series 1: us thyroid · 0.03mm/px · 13 of 63 slices shown]
[im 1/63]
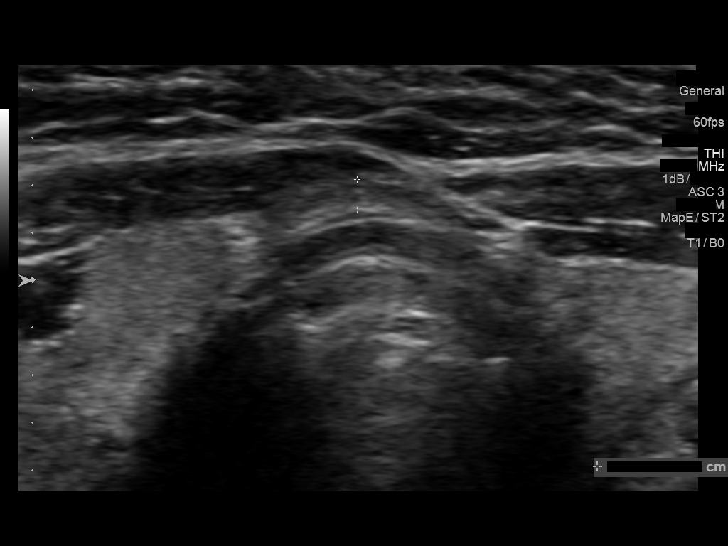
[im 6/63]
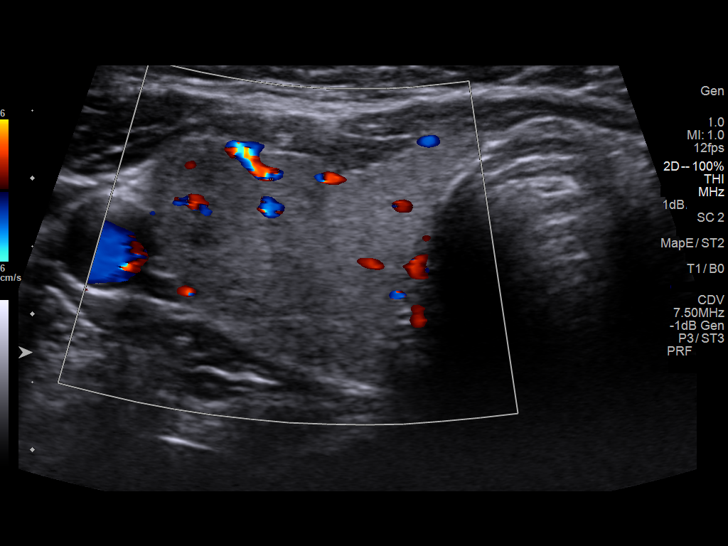
[im 11/63]
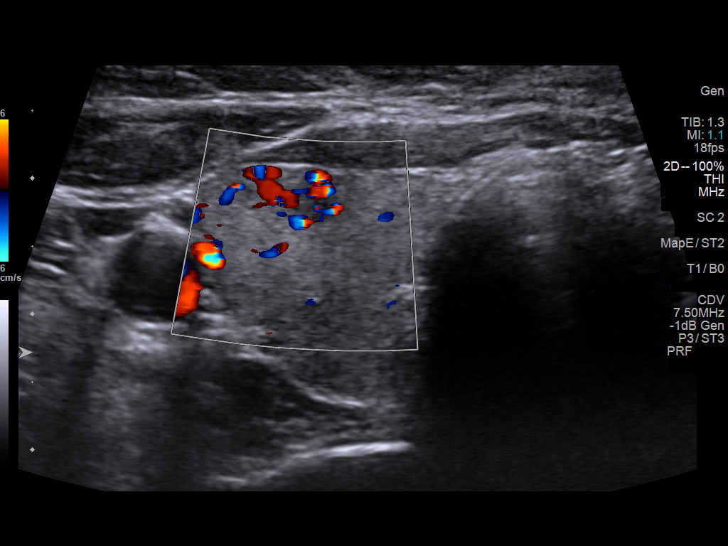
[im 16/63]
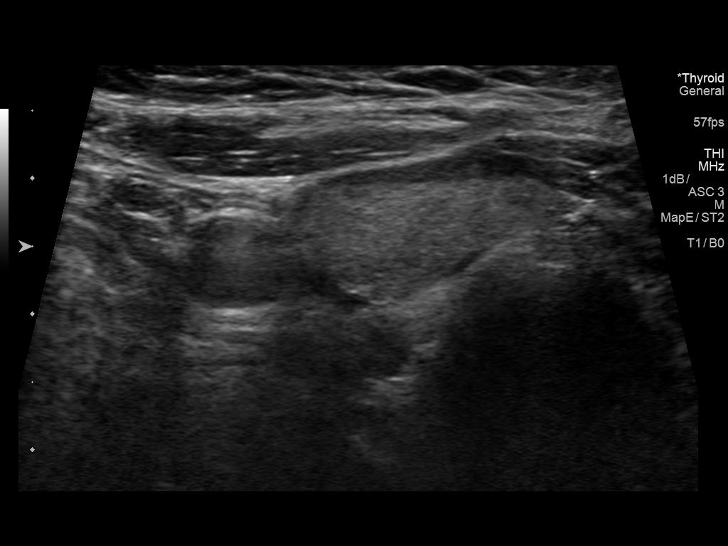
[im 21/63]
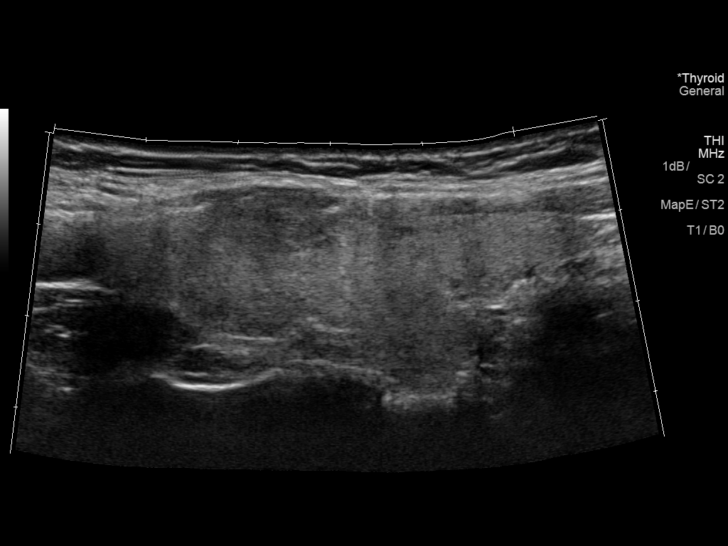
[im 26/63]
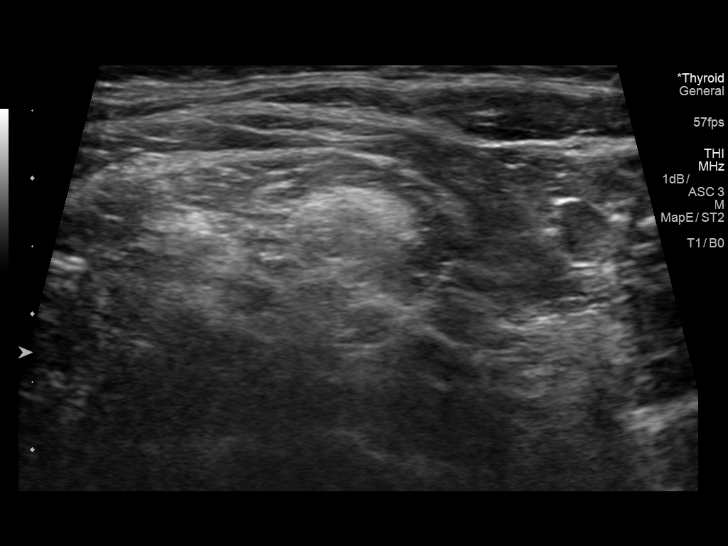
[im 32/63]
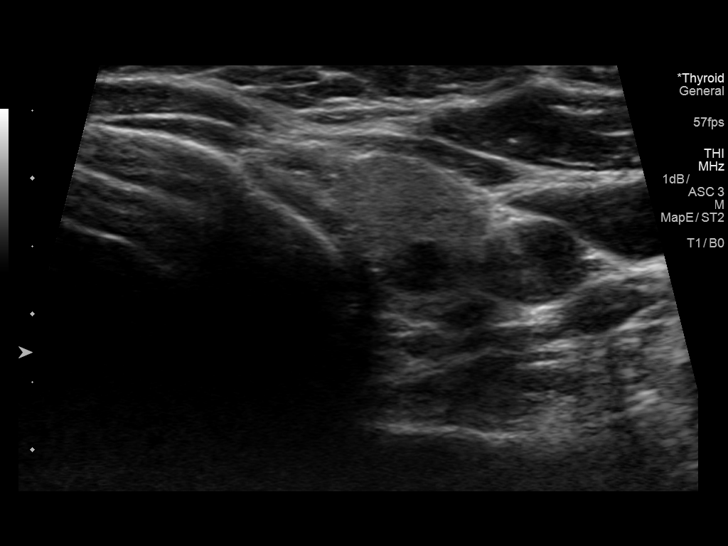
[im 37/63]
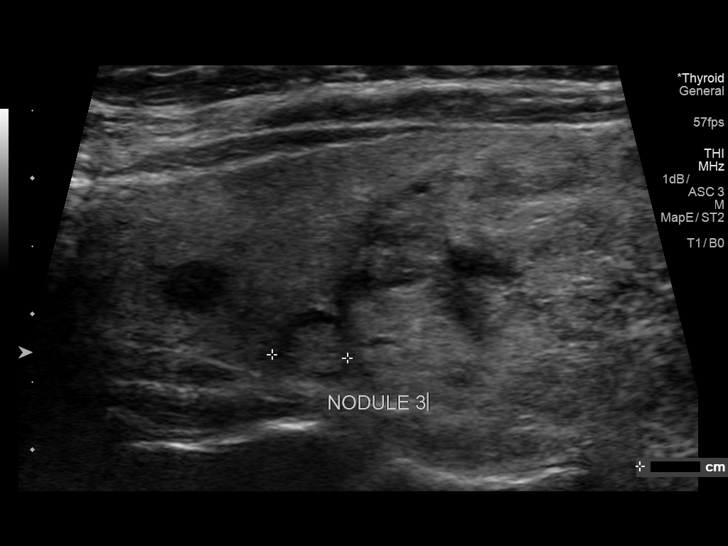
[im 42/63]
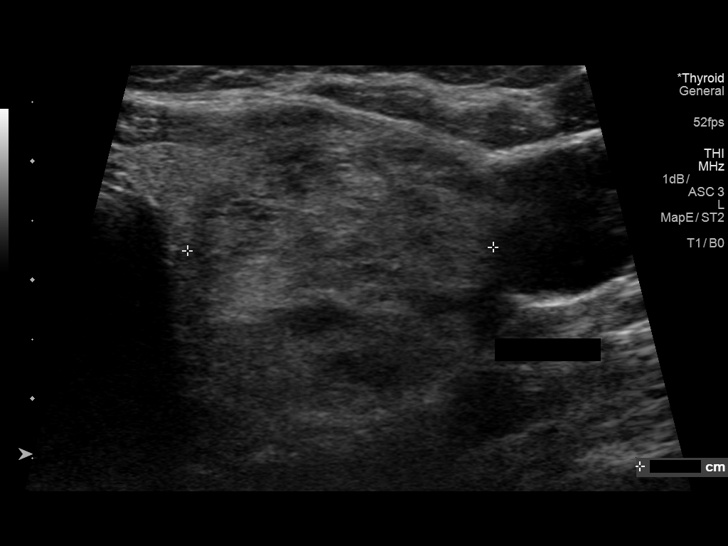
[im 47/63]
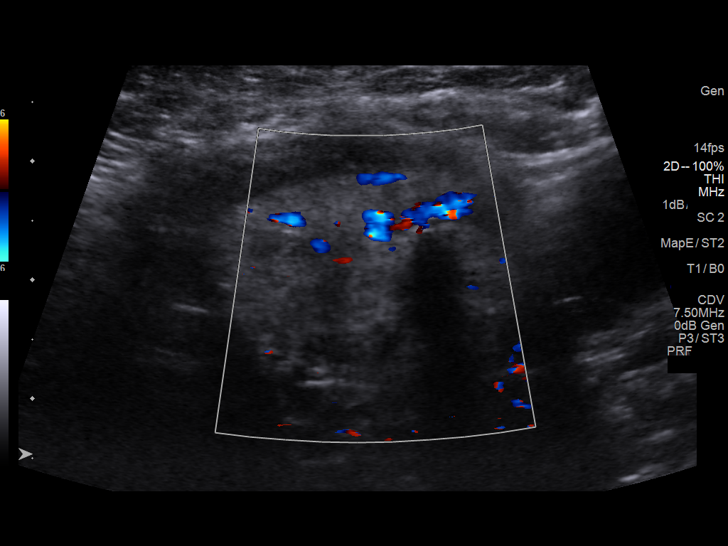
[im 52/63]
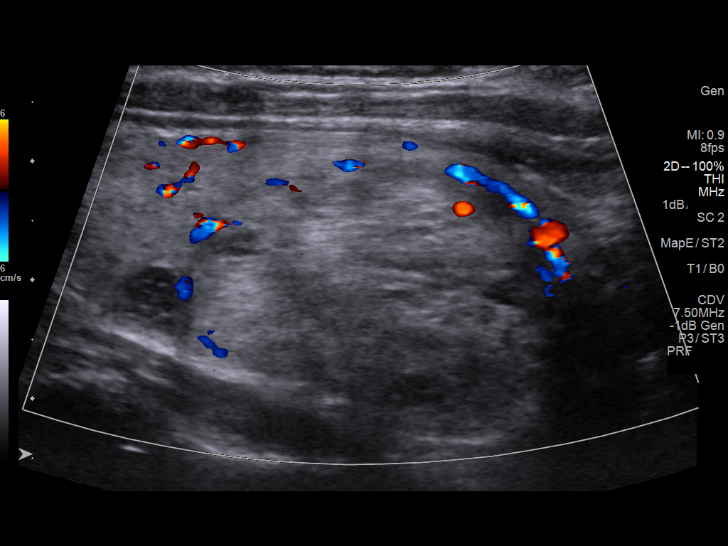
[im 57/63]
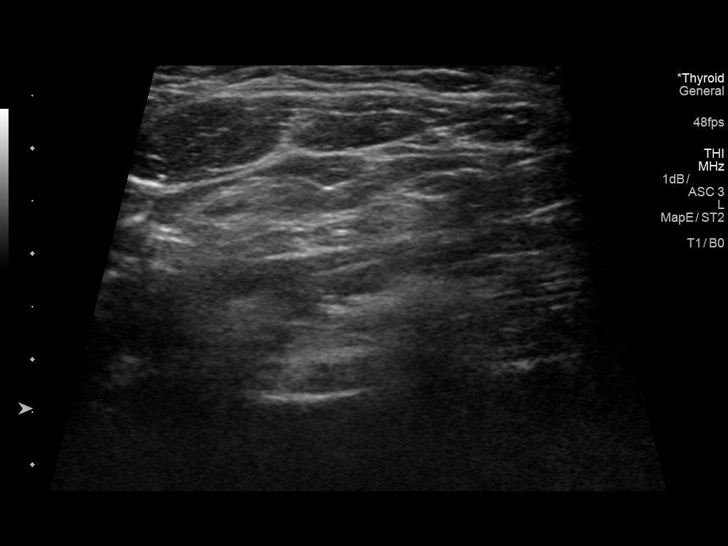
[im 63/63]
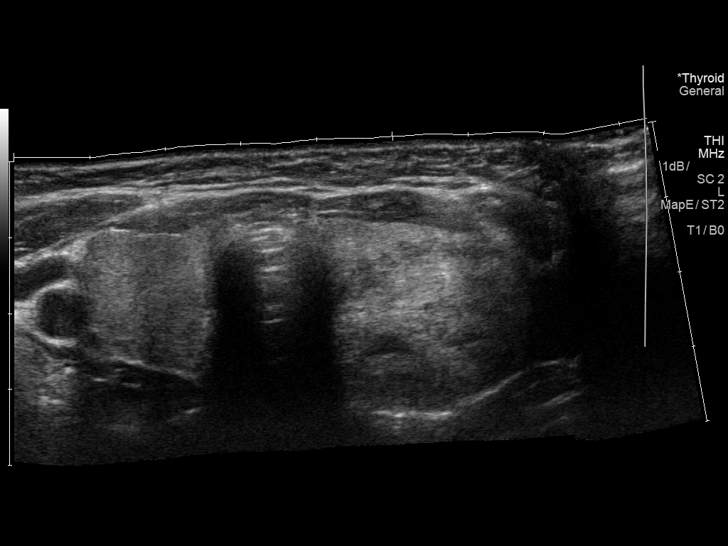

[13 of 25 positions shown; findings below may reference images not displayed]

FINDINGS: Parenchymal Echotexture: Mildly heterogenous

Isthmus: 0.1 cm

Right lobe: 5.3 x 2.1 x 2.2 cm

Left lobe: 5.7 x 2.3 x 3.3 cm

_________________________________________________________

Estimated total number of nodules >/= 1 cm: 1

Number of spongiform nodules >/=  2 cm not described below (TR1): 0

Number of mixed cystic and solid nodules >/= 1.5 cm not described
below (TR2): 0

_________________________________________________________

Nodule # 4:

Location: Left; Inferior

Maximum size: 4.1 cm; Other 2 dimensions: 2.5 x 2.6 cm

Composition: solid/almost completely solid (2)

Echogenicity: isoechoic (1)

Shape: not taller-than-wide (0)

Margins: ill-defined (0)

Echogenic foci: none (0)

ACR TI-RADS total points: 3.

ACR TI-RADS risk category: TR3 (3 points).

ACR TI-RADS recommendations:

**Given size (>/= 2.5 cm) and appearance, fine needle aspiration of
this mildly suspicious nodule should be considered based on TI-RADS
criteria.

_________________________________________________________

A few additional tiny subcentimeter hypoechoic nodules are noted
bilaterally. These nodules do not meet criteria for either biopsy or
further imaging follow-up.
IMPRESSION: The abnormality seen on the prior chest CT corresponds with a 4.1 cm
TI-RADS category 3 nodule in the left inferior thyroid gland which
meets criteria to consider fine-needle aspiration biopsy.

The above is in keeping with the ACR TI-RADS recommendations - [HOSPITAL] 0375;[DATE].

## 2018-12-07 ENCOUNTER — Encounter (INDEPENDENT_AMBULATORY_CARE_PROVIDER_SITE_OTHER): Payer: Self-pay

## 2018-12-12 IMAGING — CT CT CHEST W/ CM
2 of 5 series · 14 of 46 positions shown, 16 images · IV contrast (omnipaque)
Comparison: Chest CT 10/10/2017.

CLINICAL DATA: 52-year-old female with history of splenic marginal
zone B-cell lymphoma diagnosed in 1969. Follow-up study.

EXAM:
CT CHEST, ABDOMEN, AND PELVIS WITH CONTRAST
TECHNIQUE: Multidetector CT imaging of the chest, abdomen and pelvis was
performed following the standard protocol during bolus
administration of intravenous contrast.
CONTRAST:  100mL OMNIPAQUE IOHEXOL 300 MG/ML  SOLN

[Series 3: cap with · axial · 0.98mm/px · z∈[+1093,+1673]mm · 11 of 136 slices shown, 13 images]
[im 10/136  soft-tissue]
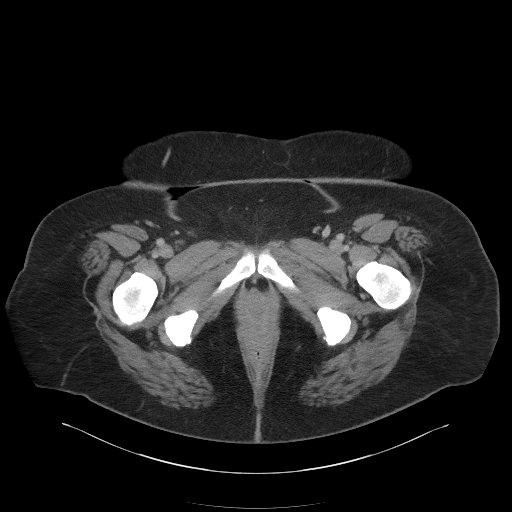
[im 10/136  bone]
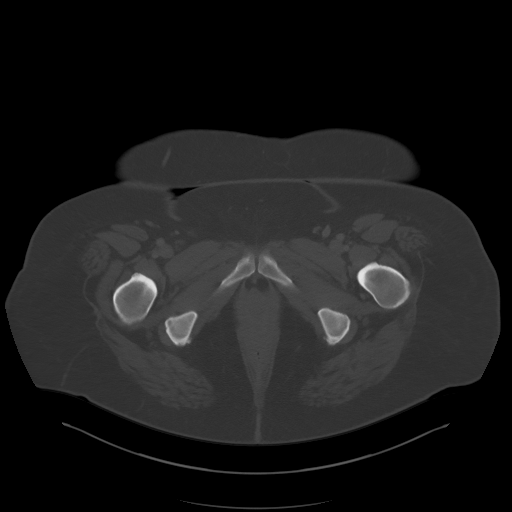
[im 20/136  soft-tissue]
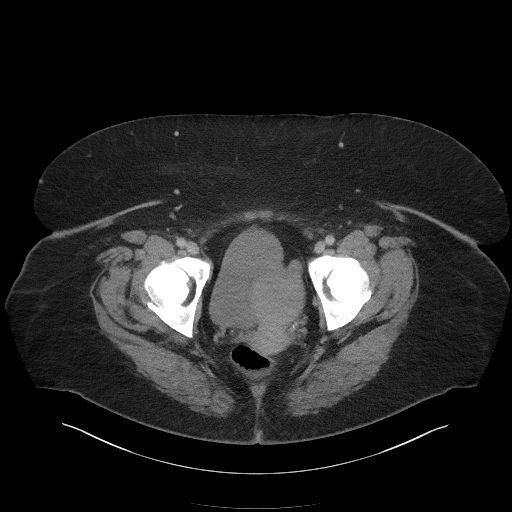
[im 29/136  soft-tissue]
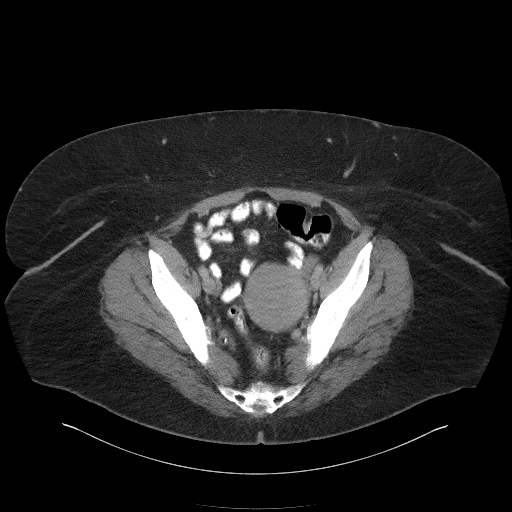
[im 49/136  soft-tissue]
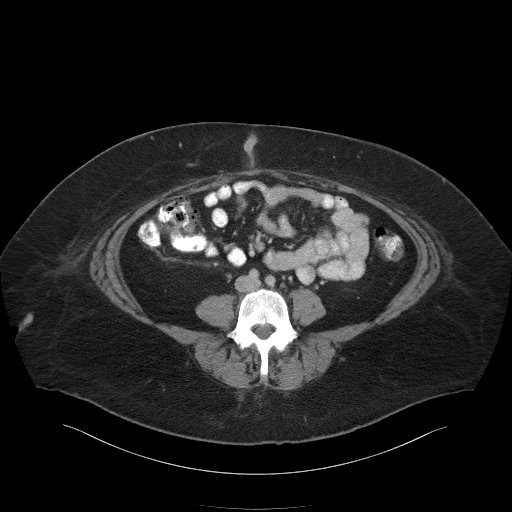
[im 58/136  soft-tissue]
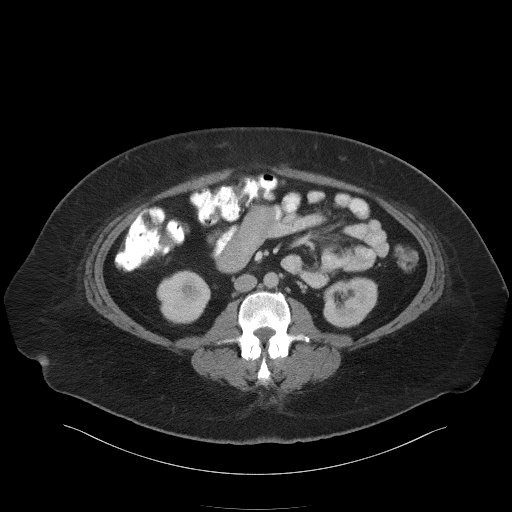
[im 68/136  soft-tissue]
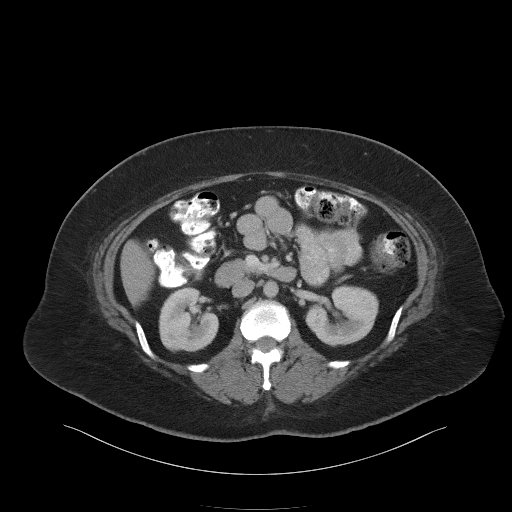
[im 78/136  soft-tissue]
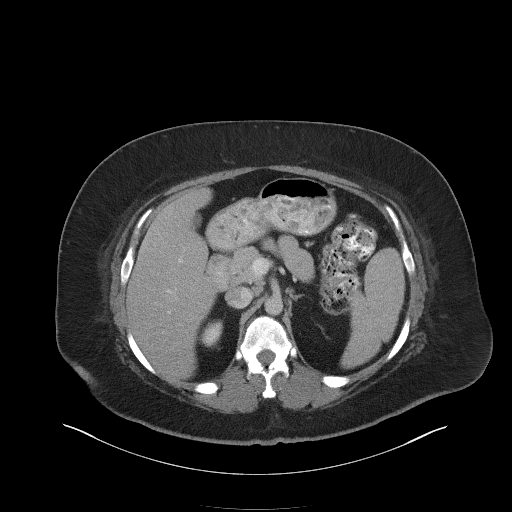
[im 87/136  soft-tissue]
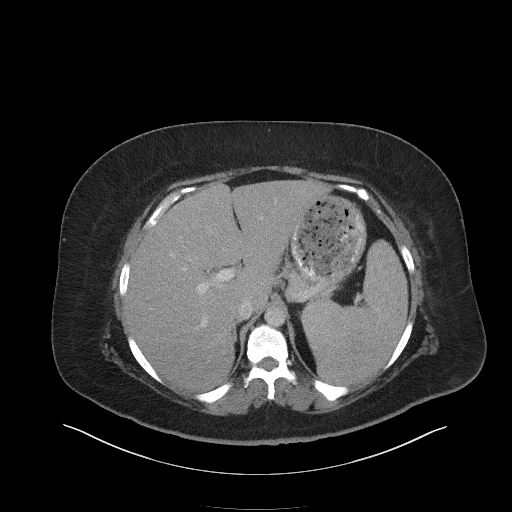
[im 107/136  soft-tissue]
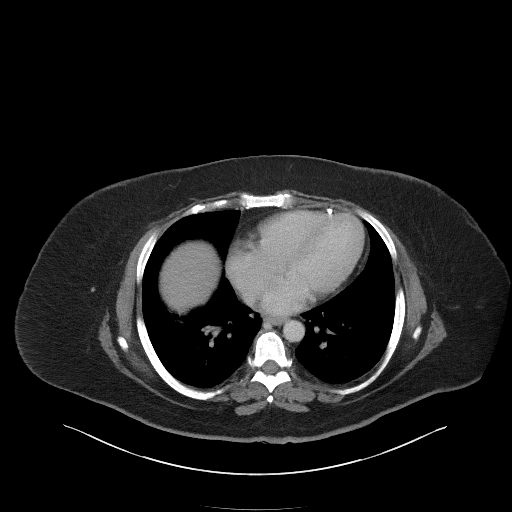
[im 107/136  bone]
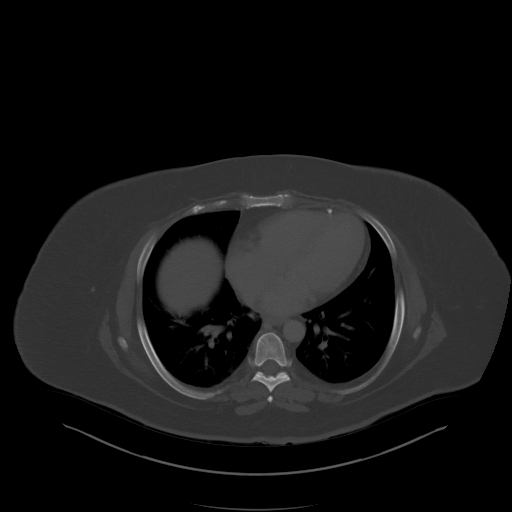
[im 116/136  soft-tissue]
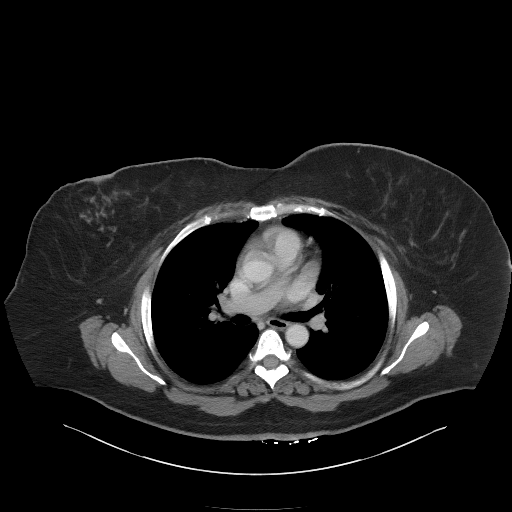
[im 126/136  soft-tissue]
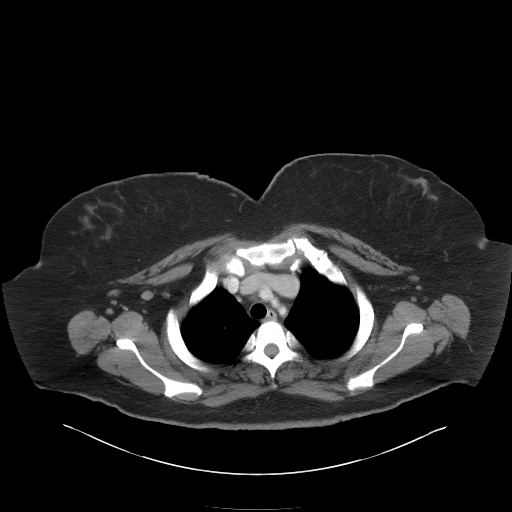

[Series 6: coronals · coronal · 0.94mm/px · 3 of 170 slices shown]
[im 57/170  soft-tissue]
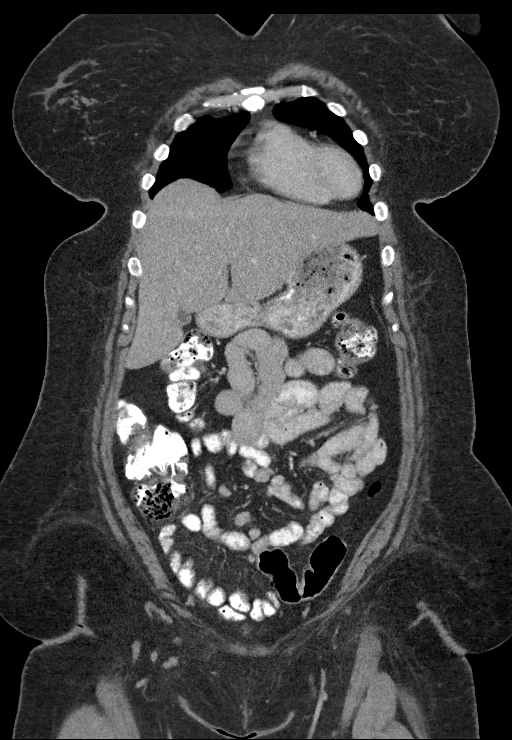
[im 76/170  soft-tissue]
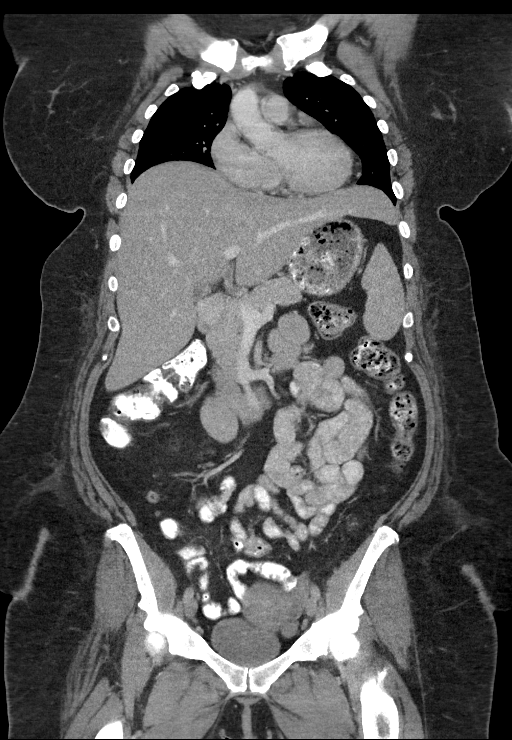
[im 94/170  soft-tissue]
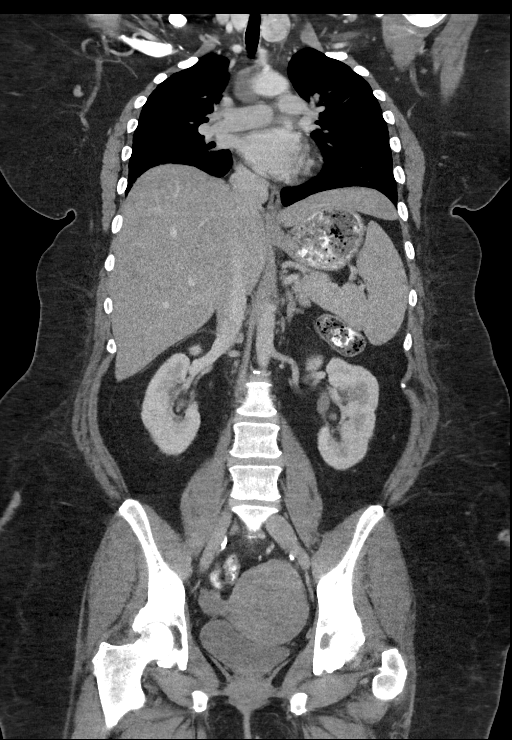

[14 of 46 positions shown; findings below may reference images not displayed]

FINDINGS: CT CHEST FINDINGS

Cardiovascular: Heart size is normal. There is no significant
pericardial fluid, thickening or pericardial calcification. There is
aortic atherosclerosis, as well as atherosclerosis of the great
vessels of the mediastinum and the coronary arteries, including
calcified atherosclerotic plaque in the left anterior descending and
left circumflex coronary arteries.

Mediastinum/Nodes: Mild enlargement of a heterogeneously enhancing
and partially calcified nodule in the left lobe of the thyroid gland
which currently measures 3.0 cm (axial image 4 of series 3) as
compared with 2.5 cm on 08/10/2017. No pathologically enlarged
mediastinal or hilar lymph nodes. Esophagus is unremarkable in
appearance.

Lungs/Pleura: No suspicious pulmonary nodules or masses. No acute
consolidative airspace disease. No pleural effusions.

Musculoskeletal: There are no aggressive appearing lytic or blastic
lesions noted in the visualized portions of the skeleton.

CT ABDOMEN PELVIS FINDINGS

Hepatobiliary: No suspicious cystic or solid hepatic lesions. No
intra or extrahepatic biliary ductal dilatation. Gallbladder is
normal in appearance.

Pancreas: No pancreatic mass. No pancreatic ductal dilatation. No
pancreatic or peripancreatic fluid or inflammatory changes.

Spleen: Spleen is enlarged measuring 6.1 x 15.1 x 12.0 cm (estimated
splenic volume of 553 mL).

Adrenals/Urinary Tract: Bilateral kidneys and bilateral adrenal
glands are normal in appearance. No hydroureteronephrosis. Urinary
bladder is normal in appearance.

Stomach/Bowel: Normal appearance of the stomach. No pathologic
dilatation of small bowel or colon. Normal appendix.

Vascular/Lymphatic: Atherosclerosis in the abdominal aorta, without
evidence of aneurysm or dissection in the abdominal or pelvic
vasculature. No lymphadenopathy noted in the abdomen or pelvis.

Reproductive: 7.8 x 7.5 x 7.7 cm mass in the uterine fundus slightly
to the left of midline, presumably a fibroid, similar to prior
PET-CT. Ovaries are unremarkable in appearance.

Other: No significant volume of ascites.  No pneumoperitoneum.

Musculoskeletal: There are no aggressive appearing lytic or blastic
lesions noted in the visualized portions of the skeleton.
IMPRESSION: 1. Persistent mild splenomegaly, similar to prior examinations. No
other findings to suggest recurrent disease in the chest, abdomen or
pelvis.
2. Aortic atherosclerosis, in addition to two vessel coronary artery
disease. Please note that although the presence of coronary artery
calcium documents the presence of coronary artery disease, the
severity of this disease and any potential stenosis cannot be
assessed on this non-gated CT examination. Assessment for potential
risk factor modification, dietary therapy or pharmacologic therapy
may be warranted, if clinically indicated.
3. Enlarging heterogeneously enhancing and partially calcified left
thyroid lobe nodule. Further evaluation with nonemergent thyroid
ultrasound is recommended in the near future to better evaluate this
lesion and determine if there is a need for fine-needle aspiration.
4. Additional incidental findings, as above.

## 2018-12-21 ENCOUNTER — Encounter (INDEPENDENT_AMBULATORY_CARE_PROVIDER_SITE_OTHER): Payer: Self-pay | Admitting: Bariatrics

## 2018-12-21 ENCOUNTER — Ambulatory Visit (INDEPENDENT_AMBULATORY_CARE_PROVIDER_SITE_OTHER): Payer: BLUE CROSS/BLUE SHIELD | Admitting: Bariatrics

## 2018-12-21 VITALS — BP 124/75 | HR 81 | Temp 97.5°F | Ht 68.0 in | Wt 258.0 lb

## 2018-12-21 DIAGNOSIS — R0602 Shortness of breath: Secondary | ICD-10-CM | POA: Diagnosis not present

## 2018-12-21 DIAGNOSIS — E559 Vitamin D deficiency, unspecified: Secondary | ICD-10-CM

## 2018-12-21 DIAGNOSIS — I10 Essential (primary) hypertension: Secondary | ICD-10-CM

## 2018-12-21 DIAGNOSIS — I214 Non-ST elevation (NSTEMI) myocardial infarction: Secondary | ICD-10-CM

## 2018-12-21 DIAGNOSIS — Z6839 Body mass index (BMI) 39.0-39.9, adult: Secondary | ICD-10-CM

## 2018-12-21 DIAGNOSIS — F3289 Other specified depressive episodes: Secondary | ICD-10-CM | POA: Diagnosis not present

## 2018-12-21 DIAGNOSIS — Z0289 Encounter for other administrative examinations: Secondary | ICD-10-CM

## 2018-12-21 DIAGNOSIS — R5383 Other fatigue: Secondary | ICD-10-CM | POA: Diagnosis not present

## 2018-12-21 DIAGNOSIS — E7849 Other hyperlipidemia: Secondary | ICD-10-CM

## 2018-12-21 DIAGNOSIS — Z9189 Other specified personal risk factors, not elsewhere classified: Secondary | ICD-10-CM | POA: Diagnosis not present

## 2018-12-21 DIAGNOSIS — E119 Type 2 diabetes mellitus without complications: Secondary | ICD-10-CM

## 2018-12-21 DIAGNOSIS — K760 Fatty (change of) liver, not elsewhere classified: Secondary | ICD-10-CM

## 2018-12-21 NOTE — Progress Notes (Signed)
.  Office: [phone]  /  Fax: [phone]   HPI:   Chief Complaint: Desiree [name] (MR# [id number]) is a 53 y.o. female who presents on 12/21/2018 for obesity evaluation and treatment. Current BMI is Body mass index is 39.23 kg/m.Marland Kitchen Desiree [name] has struggled with obesity for years and has been unsuccessful in either losing weight or maintaining long term weight loss. Desiree [name] attended our information session and states she is currently in the action stage of change and ready to dedicate time achieving and maintaining a healthier weight.  Desiree [name] states her family eats meals together she thinks her family will eat healthier with her her desired weight loss is 68-78 lbs she has been heavy most of  her life she started gaining weight progressively her heaviest weight ever was 288 lbs. she has significant food cravings issues with potatoes she snacks frequently in the evenings she wakes up frquently in the middle of the night to eat she skips either breakfast or lunch every other day she frequently makes poor food choices she sometimes has problems with excessive hunger  she frequently eats larger portions than normal  she has binge eating behaviors she struggles with emotional eating   Desiree [name] states she likes to cook sometimes and reports craving potatoes. She also states she craves food around bedtime and reports waking up at night sometimes and eats. She reports skipping both breakfast and lunch every other day.   Fatigue Desiree [name] feels her energy is lower than it should be. This has worsened with weight gain and has worsened recently. Desiree [name] admits to daytime somnolence and admits to waking up sometimes refreshed and sometimes still tired. Patient is at risk for obstructive sleep apnea. Patent has a history of symptoms of daytime fatigue. Patient generally gets 6-7 hours of sleep per night, and states they generally have restful sleep. Snoring is not present.  Apneic episodes are not present. Epworth Sleepiness Score is 6.  Dyspnea on exertion Desiree [name] notes increasing shortness of breath with exercising and seems to be worsening over time with weight gain. She notes getting out of breath sooner with activity than she used to. This has not gotten worse recently. Desiree [name] denies orthopnea.  Hypertension Desiree [name] is a 53 y.o. female with hypertension which is well controlled on metoprolol and furosemide.  Desiree [name] denies chest pain or shortness of breath on exertion. She is working weight loss to help control her blood pressure with the goal of decreasing her risk of heart attack and stroke.   NSTEMI Desiree [name] states she has a history of CAD and cardiac stents and is not on a statin. She reports myalgias with Lipitor.  Fatty Liver Desiree [name] has not had an ultrasound performed and reports her liver enzymes are elevated.  Diabetes II Desiree [name] has a diagnosis of diabetes type II and is currently on metformin. Desiree [name] states fasting blood sugar is 200. Last A1c was reported to be 12.3 on 10/17/2016. She has been working on intensive lifestyle modifications including diet, exercise, and weight loss to help control her blood glucose levels.  Hyperlipidemia Desiree [name] has hyperlipidemia and is not taking Lipitor due to myalgias. She has been trying to improve her cholesterol levels with intensive lifestyle modification including a low saturated fat diet, exercise and weight loss.   Vitamin D deficiency Desiree [name] has a diagnosis of Vitamin D deficiency. She is currently not taking Vit D.  At risk for osteopenia and osteoporosis Desiree [name] is at higher risk of osteopenia and osteoporosis due to Vitamin  D deficiency.   Depression Screen Desiree [name] had a negative depression screening but does have nighttime eating behavior. Depression is commonly associated with obesity and often results in emotional eating behaviors. We will monitor  this closely and work on CBT to help improve the non-hunger eating patterns. Referral to Psychology may be required if no improvement is seen as she continues in our clinic.  Desiree [name]'s Food and Mood (modified PHQ-9) score was 4.  Depression screen PHQ 2/9 12/21/2018  Decreased Interest 1  Down, Depressed, Hopeless 0  PHQ - 2 Score 1  Altered sleeping 1  Tired, decreased energy 1  Change in appetite 1  Feeling bad or failure about yourself  0  Trouble concentrating 0  Moving slowly or fidgety/restless 0  Suicidal thoughts 0  PHQ-9 Score 4  Difficult doing work/chores Not difficult at all   ASSESSMENT AND PLAN:  Other fatigue - Plan: EKG 12-Lead, T3, T4, free, TSH  Shortness of breath on exertion  Essential hypertension  NSTEMI (non-ST elevated myocardial infarction) (HCC)  Fatty liver  Type 2 diabetes mellitus without complication, without long-term current use of insulin (HCC) - Plan: Hemoglobin A1c, Insulin, random  Other hyperlipidemia - Plan: Lipid Panel With LDL/HDL Ratio  Vitamin D deficiency - Plan: VITAMIN D 25 Hydroxy (Vit-D Deficiency, Fractures)  Other depression - with emotional eating  At risk for osteoporosis  Class 2 severe obesity with serious comorbidity and body mass index (BMI) of 39.0 to 39.9 in adult, unspecified obesity type (HCC)  PLAN:  Fatigue Desiree [name] was informed that her fatigue may be related to obesity, depression or many other causes. Labs will be ordered, and in the meanwhile Desiree [name] has agreed to work on diet, exercise and weight loss to help with fatigue. Proper sleep hygiene was discussed including the need for 7-8 hours of quality sleep each night. A sleep study was not ordered based on symptoms and Epworth score.  Dyspnea on exertion Desiree [name]'s shortness of breath appears to be obesity related and exercise induced. She has agreed to work on weight loss and gradually increase exercise to treat her exercise induced shortness of  breath. If Desiree [name] follows our instructions and loses weight without improvement of her shortness of breath, we will plan to refer to pulmonology. We will monitor this condition regularly. Desiree [name] was advised to increase her stairs and she agrees to this plan.  Hypertension We discussed sodium restriction, working on healthy weight loss, and a regular exercise program as the means to achieve improved blood pressure control. Desiree [name] agreed with this plan and agreed to follow up as directed. We will continue to monitor her blood pressure as well as her progress with the above lifestyle modifications. She will continue her medications as prescribed and will watch for signs of hypotension as she continues her lifestyle modifications.  NSTEMI Desiree [name] will see her cardiologist yearly. She was advised that we will consider adding Zetia at her next visit.  Fatty Liver  Desiree [name] was advised that we will watch this over time and if there is no improvement we will order an ultrasound of her liver.   Diabetes II Desiree [name] has been given extensive diabetes education by myself today including ideal fasting and post-prandial blood glucose readings, individual ideal HgA1c goals  and hypoglycemia prevention. We discussed the importance of good blood sugar control to decrease the likelihood of diabetic complications such as nephropathy, neuropathy, limb loss, blindness, coronary artery disease, and death. We discussed the importance of intensive lifestyle modification including diet, exercise  and weight loss as the first line treatment for diabetes. Desiree [name] was advised that we will check a blood sugar, FBS and 2 hour postprandial. She agrees to continue her diabetes medications and will follow-up at the agreed upon time.  Hyperlipidemia Desiree [name] was informed of the American Heart Association Guidelines emphasizing intensive lifestyle modifications as the first line treatment for hyperlipidemia. We discussed  many lifestyle modifications today in depth, and Desiree [name] will continue to work on decreasing saturated fats such as fatty red meat, butter and many fried foods. She will also increase vegetables and lean protein in her diet and continue to work on exercise and weight loss efforts. We will consider adding Zetia at her next visit.  Vitamin D Deficiency Herlinda was informed that low Vitamin D levels contributes to fatigue and are associated with obesity, breast, and colon cancer. She will have routine testing of Vitamin D today. Kynadee agrees to follow-up with our clinic in 2 weeks.  At risk for osteopenia and osteoporosis Vernis was given extended  (15 minutes) osteoporosis prevention counseling today. Ashara is at risk for osteopenia and osteoporsis due to her Vitamin D deficiency. She was encouraged to take her Vitamin D and follow her higher calcium diet and increase strengthening exercise to help strengthen her bones and decrease her risk of osteopenia and osteoporosis.  Depression with Emotional Eating Behaviors We discussed behavior modification techniques today to help Levette deal with her emotional eating and depression. Donette was referred to Dr. Mallie Mussel, our bariatric psychologist, for evaluation due to elevated PHQ-9 score and significant struggles with emotional eating.  Obesity Gaynel is currently in the action stage of change and her goal is to continue with weight loss efforts. She has agreed to follow the Category 3 plan. Khamya has been instructed to work up to a goal of 150 minutes of combined cardio and strengthening exercise per week for weight loss and overall health benefits. We discussed the following Behavioral Modification Strategies today: increasing lean protein intake, decreasing simple carbohydrates, increasing vegetables, increase H20 intake, decrease eating out, no skipping meals, work on meal planning and easy cooking plans, keeping healthy foods  in the home, and ways to avoid night time snacking.  Evarose has agreed to follow-up with our clinic in 2 weeks. She was informed of the importance of frequent follow-up visits to maximize her success with intensive lifestyle modifications for her multiple health conditions. She was informed we would discuss her lab results at her next visit unless there is a critical issue that needs to be addressed sooner. Mora agreed to keep her next visit at the agreed upon time to discuss these results.  ALLERGIES: Allergies  Allergen Reactions  . Erythromycin Anaphylaxis    Swelling and difficulty breathing.  . Lovastatin     Leg cramps  . Tolnaftate Hives    Reaction caused by pills and the cream.    MEDICATIONS: Current Outpatient Medications on File Prior to Visit  Medication Sig Dispense Refill  . acetaminophen (TYLENOL) 500 MG tablet Take 1 tablet (500 mg total) by mouth every 8 (eight) hours as needed (pain). 30 tablet 0  . fish oil-omega-3 fatty acids 1000 MG capsule Take 1 g by mouth 2 (two) times daily.     . furosemide (LASIX) 20 MG tablet Take 1 tablet (20 mg total) by mouth daily. 30 tablet 3  . metFORMIN (GLUCOPHAGE) 1000 MG tablet Take 1,000 mg by mouth 2 (two) times daily with a meal.      .  metoprolol tartrate (LOPRESSOR) 100 MG tablet Take 1 tablet (100 mg total) by mouth 2 (two) times daily. PT OVERDUE FOR OV PLEASE CALL FOR APPT 60 tablet 0  . aspirin 81 MG chewable tablet Chew 1 tablet (81 mg total) by mouth daily. (Patient not taking: Reported on 12/21/2018) 30 tablet 0  . atorvastatin (LIPITOR) 80 MG tablet Take 1 tablet (80 mg total) by mouth daily at 6 PM. (Patient not taking: Reported on 12/21/2018) 30 tablet 12  . BRILINTA 90 MG TABS tablet TAKE 1 TABLET BY MOUTH TWICE A DAY (Patient not taking: Reported on 12/21/2018) 60 tablet 4  . insulin aspart (NOVOLOG FLEXPEN) 100 UNIT/ML FlexPen Before each meal 3 times a day, 140-199 - 2 units, 200-250 - 4 units, 251-299 - 6  units,  300-349 - 8 units,  350 or above 10 units. Insulin PEN if approved, provide syringes and needles if needed. (Patient not taking: Reported on 02/24/2018) 15 mL 1  . losartan (COZAAR) 100 MG tablet Take 100 mg by mouth daily.    . nitroGLYCERIN (NITROSTAT) 0.4 MG SL tablet Place 1 tablet (0.4 mg total) under the tongue every 5 (five) minutes as needed for chest pain. (Patient not taking: Reported on 12/21/2018) 30 tablet 0  . pantoprazole (PROTONIX) 40 MG tablet Take 1 tablet (40 mg total) by mouth daily. (Patient not taking: Reported on 12/21/2018) 30 tablet 12  . Potassium Chloride ER 20 MEQ TBCR Take 20 mEq by mouth daily.     . Prenatal Vit-Fe Fumarate-FA (MULTIVITAMIN-PRENATAL) 27-0.8 MG TABS tablet Take 2 tablets by mouth daily at 12 noon.    . ticagrelor (BRILINTA) 90 MG TABS tablet Take 1 tablet (90 mg total) by mouth 2 (two) times daily. (Patient not taking: Reported on 12/21/2018) 90 tablet 2  . valACYclovir (VALTREX) 500 MG tablet Take 500 mg by mouth 2 (two) times daily as needed. Takes for outbreaks only.     No current facility-administered medications on file prior to visit.     PAST MEDICAL HISTORY: Past Medical History:  Diagnosis Date  . 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 Diabetes ([place])   . Fatigue 11/17/2013  . Female infertility   . High cholesterol   . Hx of heart artery stent   . Hypertension   . Iron deficiency anemia due to chronic blood loss   . Leukocytosis 07/27/2011  . Myocardial infarction ([place]) 03/2016  . Non Hodgkin's lymphoma ([place])    recieving chemo 06/2014  . Palpitation 06/30/2014  . 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 Tachycardia with 100 - 120 beats per minute 11/29/2014  . Type II diabetes mellitus ([place])     PAST SURGICAL HISTORY: Past Surgical History:  Procedure Laterality Date  . BONE MARROW BIOPSY  0215 X 2  . CARDIAC CATHETERIZATION N/A 04/09/2016   Procedure: Left Heart Cath and Coronary  Angiography;  Surgeon: Peter M Martinique, MD;  Location: [facility];  Service: Cardiovascular;  Laterality: N/A;  . CARDIAC CATHETERIZATION N/A 04/09/2016   Procedure: Coronary Stent Intervention;  Surgeon: Peter M Martinique, MD;  Location: [facility];  Service: Cardiovascular;  Laterality: N/A;  . CARDIAC CATHETERIZATION N/A 10/16/2016   Procedure: Left Heart Cath and Coronary Angiography;  Surgeon: Peter M Martinique, MD;  Location: [place];  Service: Cardiovascular;  Laterality: N/A;  . CARDIAC CATHETERIZATION N/A 10/16/2016   Procedure: Coronary Stent Intervention;  Surgeon: Peter M Martinique, MD;  Location: [facility];  Service: Cardiovascular;  Laterality: N/A;  . CARDIAC CATHETERIZATION N/A 10/16/2016   Procedure: Intravascular Ultrasound/IVUS;  Surgeon: Peter M Martinique, MD;  Location: [place];  Service: Cardiovascular;  Laterality: N/A;  . WISDOM TOOTH EXTRACTION      SOCIAL HISTORY: Social History   Tobacco Use  . Smoking status: Former Smoker    Packs/day: 0.50    Years: 22.00    Pack years: 11.00    Types: Cigarettes    Last attempt to quit: 10/13/2005    Years since quitting: 13.1  . Smokeless tobacco: Never Used  Substance Use Topics  . Alcohol use: Yes    Comment: 10/15/2016 "nothing in the last 3 years"  . Drug use: No    FAMILY HISTORY: Family History  Problem Relation Age of Onset  . Diabetes Mother   . High blood pressure Mother   . Heart disease Mother   . Cancer Mother   . Cancer Maternal Aunt        breast ca  . Breast cancer Maternal Aunt   . Cancer Maternal Uncle        lung ca  . Thyroid disease Neg Hx    ROS: Review of Systems  Eyes:       Positive for floaters.  Musculoskeletal:       Positive for leg cramping.   PHYSICAL EXAM: Blood pressure 124/75, pulse 81, temperature (!) 97.5 F (36.4 C), temperature source Oral, height '5\' 8"'  (1.727 m), weight 258 lb (117 kg), last menstrual period 01/20/2017, SpO2 99 %. Body  mass index is 39.23 kg/m. Physical Exam Vitals signs reviewed.  Constitutional:      Appearance: Normal appearance. She is well-developed. She is obese.  HENT:     Head: Normocephalic and atraumatic.     Comments: Mallampati = 4.    Nose: Nose normal.  Eyes:     General: No scleral icterus. Neck:     Musculoskeletal: Normal range of motion.  Cardiovascular:     Rate and Rhythm: Normal rate and regular rhythm.  Pulmonary:     Effort: Pulmonary effort is normal. No respiratory distress.  Abdominal:     Palpations: Abdomen is soft.     Tenderness: There is no abdominal tenderness.  Musculoskeletal: Normal range of motion.     Right lower leg: Edema (trace) present.     Left lower leg: Edema (trace) present.     Comments: Range of motion normal in all four extremities.  Skin:    General: Skin is warm and dry.  Neurological:     Mental Status: She is alert and oriented to person, place, and time.     Coordination: Coordination normal.  Psychiatric:        Mood and Affect: Mood and affect normal.        Behavior: Behavior normal.   RECENT LABS AND TESTS: BMET    Component Value Date/Time   NA 140 10/22/2018 0809   NA 138 07/24/2017 1105   K 3.9 10/22/2018 0809   K 4.2 07/24/2017 1105   CL 101 10/22/2018 0809   CL 106 11/19/2012 0837   CO2 28 10/22/2018 0809   CO2 26 07/24/2017 1105   GLUCOSE 243 (H) 10/22/2018 0809   GLUCOSE 362 (H) 07/24/2017 1105   GLUCOSE 107 (H) 11/19/2012 0837   BUN 13 10/22/2018 0809   BUN 10.0 07/24/2017 1105   CREATININE 0.91 10/22/2018 0809   CREATININE 0.8 07/24/2017 1105  CALCIUM 9.2 10/22/2018 0809   CALCIUM 8.8 07/24/2017 1105   GFRNONAA >60 10/22/2018 0809   GFRAA >60 10/22/2018 0809   Lab Results  Component Value Date   HGBA1C 12.3 (H) 10/17/2016   No results found for: INSULIN CBC    Component Value Date/Time   WBC 16.2 (H) 10/22/2018 0809   WBC 17.0 (H) 07/23/2018 0804   RBC 5.63 (H) 10/22/2018 0809   HGB 13.6  10/22/2018 0809   HGB 12.4 07/24/2017 1105   HCT 43.0 10/22/2018 0809   HCT 38.2 07/24/2017 1105   PLT 173 10/22/2018 0809   PLT 193 07/24/2017 1105   MCV 76.4 (L) 10/22/2018 0809   MCV 77.8 (L) 07/24/2017 1105   MCH 24.2 (L) 10/22/2018 0809   MCHC 31.6 10/22/2018 0809   RDW 14.1 10/22/2018 0809   RDW 14.5 07/24/2017 1105   LYMPHSABS 12.0 (H) 10/22/2018 0809   LYMPHSABS 5.8 (H) 07/24/2017 1105   MONOABS 1.0 10/22/2018 0809   MONOABS 0.7 07/24/2017 1105   EOSABS 0.3 10/22/2018 0809   EOSABS 0.2 07/24/2017 1105   BASOSABS 0.2 (H) 10/22/2018 0809   BASOSABS 0.1 07/24/2017 1105   Iron/TIBC/Ferritin/ %Sat    Component Value Date/Time   IRON 38 (L) 07/23/2018 0804   IRON 37 (L) 07/24/2017 1105   TIBC 285 07/23/2018 0804   TIBC 268 07/24/2017 1105   FERRITIN 129 07/23/2018 0804   FERRITIN 81 07/24/2017 1105   IRONPCTSAT 13 (L) 07/23/2018 0804   IRONPCTSAT 14 (L) 07/24/2017 1105   IRONPCTSAT 8 (L) 11/19/2012 0837   Lipid Panel     Component Value Date/Time   CHOL 105 10/16/2016 0234   TRIG 130 10/16/2016 0234   HDL 27 (L) 10/16/2016 0234   CHOLHDL 3.9 10/16/2016 0234   VLDL 26 10/16/2016 0234   LDLCALC 52 10/16/2016 0234   Hepatic Function Panel     Component Value Date/Time   PROT 7.0 10/22/2018 0809   PROT 6.3 (L) 07/24/2017 1105   ALBUMIN 3.7 10/22/2018 0809   ALBUMIN 3.4 (L) 07/24/2017 1105   AST 11 (L) 10/22/2018 0809   AST 13 07/24/2017 1105   ALT 16 10/22/2018 0809   ALT 21 07/24/2017 1105   ALKPHOS 109 10/22/2018 0809   ALKPHOS 112 07/24/2017 1105   BILITOT 0.7 10/22/2018 0809   BILITOT 0.55 07/24/2017 1105   BILIDIR <0.1 (L) 10/15/2016 1102   IBILI NOT CALCULATED 10/15/2016 1102      Component Value Date/Time   TSH 2.23 02/24/2018 0831   No Results Found for: Vitamin D.  ECG shows NSR with a rate of 85 BPM.  INDIRECT CALORIMETER done today shows a VO2 of 268 and a REE of 1863. Her calculated basal metabolic rate is 4132 thus her basal metabolic  rate is worse than expected.  OBESITY BEHAVIORAL INTERVENTION VISIT  Today's visit was #1   Starting weight: 258 lbs Starting date: 12/21/2018 Today's weight: 258 lbs Today's date: 12/21/2018 Total lbs lost to date: 0    12/21/2018  Height '5\' 8"'  (1.727 m)  Weight 258 lb (117 kg)  BMI (Calculated) 39.24  BLOOD PRESSURE - SYSTOLIC 440  BLOOD PRESSURE - DIASTOLIC 75  Waist Measurement  47 inches   Body Fat % 49 %  Total Body Water (lbs) 105 lbs  RMR 1863   ASK: We discussed the diagnosis of obesity with Allen Norris today and Shaton agreed to give [place] permission to discuss obesity behavioral modification therapy today.  ASSESS: Annessa has the diagnosis  of obesity and her BMI today is 39.24. Danett is in the action stage of change.  ADVISE: Aliya was educated on the multiple health risks of obesity as well as the benefit of weight loss to improve her health. She was advised of the need for long term treatment and the importance of lifestyle modifications to improve her current health and to decrease her risk of future health problems.  AGREE: Multiple dietary modification options and treatment options were discussed and  Kody agreed to follow the recommendations documented in the above note.  ARRANGE: Baila was educated on the importance of frequent visits to treat obesity as outlined per CMS and USPSTF guidelines and agreed to schedule her next follow-up appointment today.   Migdalia Dk, am acting as Location manager for [employer], DO  I have reviewed the above documentation for accuracy and completeness, and I agree with the above. -Jearld Lesch, DO

## 2018-12-22 LAB — LIPID PANEL WITH LDL/HDL RATIO
Cholesterol, Total: 172 mg/dL (ref 100–199)
HDL: 33 mg/dL — ABNORMAL LOW (ref >39)
LDL Calculated: 118 mg/dL — ABNORMAL HIGH (ref 0–99)
LDl/HDL Ratio: 3.6 ratio — ABNORMAL HIGH (ref 0.0–3.2)
Triglycerides: 106 mg/dL (ref 0–149)
VLDL Cholesterol Cal: 21 mg/dL (ref 5–40)

## 2018-12-22 LAB — INSULIN, RANDOM: INSULIN: 6 u[IU]/mL (ref 2.6–24.9)

## 2018-12-22 LAB — T3: T3, Total: 110 ng/dL (ref 71–180)

## 2018-12-22 LAB — HEMOGLOBIN A1C
Est. average glucose Bld gHb Est-mCnc: 289 mg/dL
Hgb A1c MFr Bld: 11.7 % — ABNORMAL HIGH (ref 4.8–5.6)

## 2018-12-22 LAB — T4, FREE: Free T4: 1.41 ng/dL (ref 0.82–1.77)

## 2018-12-22 LAB — TSH: TSH: 2.65 u[IU]/mL (ref 0.450–4.500)

## 2018-12-22 LAB — VITAMIN D 25 HYDROXY (VIT D DEFICIENCY, FRACTURES): Vit D, 25-Hydroxy: 10.9 ng/mL — ABNORMAL LOW (ref 30.0–100.0)

## 2019-01-04 ENCOUNTER — Ambulatory Visit (INDEPENDENT_AMBULATORY_CARE_PROVIDER_SITE_OTHER): Payer: Self-pay | Admitting: Bariatrics

## 2019-01-05 ENCOUNTER — Encounter (INDEPENDENT_AMBULATORY_CARE_PROVIDER_SITE_OTHER): Payer: Self-pay

## 2019-01-06 ENCOUNTER — Ambulatory Visit (INDEPENDENT_AMBULATORY_CARE_PROVIDER_SITE_OTHER): Payer: BLUE CROSS/BLUE SHIELD | Admitting: Psychology

## 2019-01-11 ENCOUNTER — Telehealth: Payer: Self-pay

## 2019-01-11 NOTE — Telephone Encounter (Signed)
Spoke with pt by phone and gave new appt date, time for July 9.

## 2019-01-19 ENCOUNTER — Encounter (INDEPENDENT_AMBULATORY_CARE_PROVIDER_SITE_OTHER): Payer: Self-pay

## 2019-01-19 ENCOUNTER — Ambulatory Visit (INDEPENDENT_AMBULATORY_CARE_PROVIDER_SITE_OTHER): Payer: Self-pay | Admitting: Bariatrics

## 2019-01-21 ENCOUNTER — Other Ambulatory Visit: Payer: BLUE CROSS/BLUE SHIELD

## 2019-01-21 ENCOUNTER — Ambulatory Visit: Payer: BLUE CROSS/BLUE SHIELD | Admitting: Hematology and Oncology

## 2019-04-21 ENCOUNTER — Inpatient Hospital Stay: Payer: BC Managed Care – PPO

## 2019-04-21 ENCOUNTER — Ambulatory Visit: Payer: BLUE CROSS/BLUE SHIELD | Admitting: Hematology and Oncology

## 2019-04-28 ENCOUNTER — Telehealth: Payer: Self-pay | Admitting: Cardiovascular Disease

## 2019-04-28 NOTE — Telephone Encounter (Signed)
Lm for recall °

## 2019-06-09 ENCOUNTER — Ambulatory Visit: Payer: Self-pay | Admitting: Hematology and Oncology

## 2019-06-09 ENCOUNTER — Other Ambulatory Visit: Payer: Self-pay

## 2019-07-14 ENCOUNTER — Other Ambulatory Visit: Payer: Self-pay | Admitting: Internal Medicine

## 2019-07-14 DIAGNOSIS — Z1231 Encounter for screening mammogram for malignant neoplasm of breast: Secondary | ICD-10-CM

## 2019-07-18 ENCOUNTER — Telehealth: Payer: Self-pay

## 2019-07-18 ENCOUNTER — Inpatient Hospital Stay: Payer: BC Managed Care – PPO | Attending: Hematology and Oncology

## 2019-07-18 ENCOUNTER — Other Ambulatory Visit: Payer: Self-pay

## 2019-07-18 ENCOUNTER — Inpatient Hospital Stay: Payer: BC Managed Care – PPO | Admitting: Hematology and Oncology

## 2019-07-18 ENCOUNTER — Encounter: Payer: Self-pay | Admitting: Hematology and Oncology

## 2019-07-18 VITALS — BP 117/88 | HR 79 | Temp 97.8°F | Resp 18 | Ht 68.0 in | Wt 257.3 lb

## 2019-07-18 DIAGNOSIS — Z23 Encounter for immunization: Secondary | ICD-10-CM

## 2019-07-18 DIAGNOSIS — E1165 Type 2 diabetes mellitus with hyperglycemia: Secondary | ICD-10-CM | POA: Diagnosis not present

## 2019-07-18 DIAGNOSIS — C8307 Small cell B-cell lymphoma, spleen: Secondary | ICD-10-CM

## 2019-07-18 DIAGNOSIS — Z8572 Personal history of non-Hodgkin lymphomas: Secondary | ICD-10-CM | POA: Insufficient documentation

## 2019-07-18 LAB — CBC WITH DIFFERENTIAL/PLATELET
Abs Immature Granulocytes: 0.21 10*3/uL — ABNORMAL HIGH (ref 0.00–0.07)
Basophils Absolute: 0.1 10*3/uL (ref 0.0–0.1)
Basophils Relative: 0 %
Eosinophils Absolute: 0.2 10*3/uL (ref 0.0–0.5)
Eosinophils Relative: 1 %
HCT: 38.8 % (ref 36.0–46.0)
Hemoglobin: 12.1 g/dL (ref 12.0–15.0)
Immature Granulocytes: 1 %
Lymphocytes Relative: 78 %
Lymphs Abs: 22.2 10*3/uL — ABNORMAL HIGH (ref 0.7–4.0)
MCH: 23.1 pg — ABNORMAL LOW (ref 26.0–34.0)
MCHC: 31.2 g/dL (ref 30.0–36.0)
MCV: 74.2 fL — ABNORMAL LOW (ref 80.0–100.0)
Monocytes Absolute: 0.9 10*3/uL (ref 0.1–1.0)
Monocytes Relative: 3 %
Neutro Abs: 4.9 10*3/uL (ref 1.7–7.7)
Neutrophils Relative %: 17 %
Platelets: 163 10*3/uL (ref 150–400)
RBC: 5.23 MIL/uL — ABNORMAL HIGH (ref 3.87–5.11)
RDW: 15.3 % (ref 11.5–15.5)
WBC: 28.5 10*3/uL — ABNORMAL HIGH (ref 4.0–10.5)
nRBC: 0 % (ref 0.0–0.2)

## 2019-07-18 LAB — COMPREHENSIVE METABOLIC PANEL
ALT: 12 U/L (ref 0–44)
AST: 12 U/L — ABNORMAL LOW (ref 15–41)
Albumin: 3.7 g/dL (ref 3.5–5.0)
Alkaline Phosphatase: 120 U/L (ref 38–126)
Anion gap: 11 (ref 5–15)
BUN: 9 mg/dL (ref 6–20)
CO2: 27 mmol/L (ref 22–32)
Calcium: 9.1 mg/dL (ref 8.9–10.3)
Chloride: 102 mmol/L (ref 98–111)
Creatinine, Ser: 0.85 mg/dL (ref 0.44–1.00)
GFR calc Af Amer: >60 mL/min (ref >60)
GFR calc non Af Amer: >60 mL/min (ref >60)
Glucose, Bld: 374 mg/dL — ABNORMAL HIGH (ref 70–99)
Potassium: 4.3 mmol/L (ref 3.5–5.1)
Sodium: 140 mmol/L (ref 135–145)
Total Bilirubin: 0.6 mg/dL (ref 0.3–1.2)
Total Protein: 6.6 g/dL (ref 6.5–8.1)

## 2019-07-18 MED ORDER — INFLUENZA VAC SPLIT QUAD 0.5 ML IM SUSY
PREFILLED_SYRINGE | INTRAMUSCULAR | Status: AC
  Filled 2019-07-18: qty 0.5

## 2019-07-18 MED ORDER — INFLUENZA VAC SPLIT QUAD 0.5 ML IM SUSY
0.5000 mL | PREFILLED_SYRINGE | Freq: Once | INTRAMUSCULAR | Status: AC
  Administered 2019-07-18: 0.5 mL via INTRAMUSCULAR

## 2019-07-18 NOTE — Telephone Encounter (Signed)
Called and given below message. She verbalized understanding. 

## 2019-07-18 NOTE — Progress Notes (Signed)
[facility] OFF[facility] PROGRESS NOTE  Patient Care Team: Lennie Odor, PA-C as PCP - General (Nurse Practitioner) Heath Lark, MD as Consulting Physician (Hematology and Oncology)  ASSESSMENT & PLAN:  Splenic marginal zone b-cell lymphoma ([place]) Clinically, I did not feel any new lymphadenopathy She has worsening lymphocytosis but it has not affected her hemoglobin or platelet count She is educated to watch out for signs and symptoms of cancer recurrence She has appointment to see her primary care doctor in December and will get another CBC recheck then She will call me with test results I plan to see her in 4 months for further follow-up We discussed the importance of preventive care and reviewed the vaccination programs. She does not have any prior allergic reactions to influenza vaccination. She agrees to proceed with influenza vaccination today and we will administer it today at the clinic.   Diabetes mellitus type 2, uncontrolled ([place]) She has poorly controlled diabetes I am concerned about her cardiovascular risk factors I recommend her to contact her primary care doctor for medical management   No orders of the defined types were placed in this encounter.   INTERVAL HISTORY: Please see below for problem oriented charting. She returns for lymphoma follow-up She feels well No recent infection, fever or chills No new lymphadenopathy Her appetite is stable Denies abnormal night sweats  SUMMARY OF ONCOLOGIC HISTORY: Oncology History  Splenic marginal zone b-cell lymphoma ([place])  01/11/2009 Initial Diagnosis   Splenic marginal zone b-cell lymphoma   06/06/2014 Bone Marrow Biopsy   Bone marrow aspirate and biopsy confirmed extensive involvement by CD20 positive non-Hodgkin's lymphoma.   06/07/2014 Imaging   She had a PET scan which showed predominant splenic involvement.   06/16/2014 - 07/07/2014 Chemotherapy   She started on weekly rituximab.   06/16/2014 Adverse  Reaction   She had infusion reaction, resolved with IV dexamethasone.   08/18/2014 Imaging   PET CT scan show resolution of hypermetabolic activity.   04/08/2016 Imaging   1. No evidence for aortic dissection or vascular injury. 2. Mild atherosclerotic changes within the abdominal aorta and branch vessels without aneurysm. 3. No significant adenopathy or evidence for recurrent lymphoma. 4. Stable mild splenomegaly. 5. 5 mm nodule in the right lower lobe demonstrates interval increase in size. No follow-up needed if patient is low-risk. Non-contrast chest CT can be considered in 12 months if patient is high-risk.  6. Degenerate changes at the SI joints bilaterally.   01/21/2018 Imaging   1. Persistent mild splenomegaly, similar to prior examinations. No other findings to suggest recurrent disease in the chest, abdomen or pelvis. 2. Aortic atherosclerosis, in addition to two vessel coronary artery disease. Please note that although the presence of coronary artery calcium documents the presence of coronary artery disease, the severity of this disease and any potential stenosis cannot be assessed on this non-gated CT examination. Assessment for potential risk factor modification, dietary therapy or pharmacologic therapy may be warranted, if clinically indicated.  3. Enlarging heterogeneously enhancing and partially calcified left thyroid lobe nodule. Further evaluation with nonemergent thyroid ultrasound is recommended in the near future to better evaluate this lesion and determine if there is a need for fine-needle aspiration. 4. Additional incidental findings, as above.     REVIEW OF SYSTEMS:   Constitutional: Denies fevers, chills or abnormal weight loss Eyes: Denies blurriness of vision Ears, nose, mouth, throat, and face: Denies mucositis or sore throat Respiratory: Denies cough, dyspnea or wheezes Cardiovascular: Denies palpitation, chest discomfort  or lower extremity  swelling Gastrointestinal:  Denies nausea, heartburn or change in bowel habits Skin: Denies abnormal skin rashes Lymphatics: Denies new lymphadenopathy or easy bruising Neurological:Denies numbness, tingling or new weaknesses Behavioral/Psych: Mood is stable, no new changes  All other systems were reviewed with the patient and are negative.  I have reviewed the past medical history, past surgical history, social history and family history with the patient and they are unchanged from previous note.  ALLERGIES:  is allergic to erythromycin; lovastatin; and tolnaftate.  MEDICATIONS:  Current Outpatient Medications  Medication Sig Dispense Refill  . acetaminophen (TYLENOL) 500 MG tablet Take 1 tablet (500 mg total) by mouth every 8 (eight) hours as needed (pain). 30 tablet 0  . aspirin 81 MG chewable tablet Chew 1 tablet (81 mg total) by mouth daily. (Patient not taking: Reported on 12/21/2018) 30 tablet 0  . atorvastatin (LIPITOR) 80 MG tablet Take 1 tablet (80 mg total) by mouth daily at 6 PM. (Patient not taking: Reported on 12/21/2018) 30 tablet 12  . BRILINTA 90 MG TABS tablet TAKE 1 TABLET BY MOUTH TWICE A DAY (Patient not taking: Reported on 12/21/2018) 60 tablet 4  . fish oil-omega-3 fatty acids 1000 MG capsule Take 1 g by mouth 2 (two) times daily.     . furosemide (LASIX) 20 MG tablet Take 1 tablet (20 mg total) by mouth daily. 30 tablet 3  . insulin aspart (NOVOLOG FLEXPEN) 100 UNIT/ML FlexPen Before each meal 3 times a day, 140-199 - 2 units, 200-250 - 4 units, 251-299 - 6 units,  300-349 - 8 units,  350 or above 10 units. Insulin PEN if approved, provide syringes and needles if needed. (Patient not taking: Reported on 02/24/2018) 15 mL 1  . losartan (COZAAR) 100 MG tablet Take 100 mg by mouth daily.    . metFORMIN (GLUCOPHAGE) 1000 MG tablet Take 1,000 mg by mouth 2 (two) times daily with a meal.      . metoprolol tartrate (LOPRESSOR) 100 MG tablet Take 1 tablet (100 mg total) by mouth  2 (two) times daily. PT OVERDUE FOR OV PLEASE CALL FOR APPT 60 tablet 0  . nitroGLYCERIN (NITROSTAT) 0.4 MG SL tablet Place 1 tablet (0.4 mg total) under the tongue every 5 (five) minutes as needed for chest pain. (Patient not taking: Reported on 12/21/2018) 30 tablet 0  . pantoprazole (PROTONIX) 40 MG tablet Take 1 tablet (40 mg total) by mouth daily. (Patient not taking: Reported on 12/21/2018) 30 tablet 12  . Potassium Chloride ER 20 MEQ TBCR Take 20 mEq by mouth daily.     . Prenatal Vit-Fe Fumarate-FA (MULTIVITAMIN-PRENATAL) 27-0.8 MG TABS tablet Take 2 tablets by mouth daily at 12 noon.    . ticagrelor (BRILINTA) 90 MG TABS tablet Take 1 tablet (90 mg total) by mouth 2 (two) times daily. (Patient not taking: Reported on 12/21/2018) 90 tablet 2  . valACYclovir (VALTREX) 500 MG tablet Take 500 mg by mouth 2 (two) times daily as needed. Takes for outbreaks only.     No current facility-administered medications for this visit.     PHYSICAL EXAMINATION: ECOG PERFORMANCE STATUS: 0 - Asymptomatic  Vitals:   07/18/19 0828  BP: 117/88  Pulse: 79  Resp: 18  Temp: 97.8 F (36.6 C)  SpO2: 100%   Filed Weights   07/18/19 0828  Weight: 257 lb 4.8 oz (116.7 kg)    GENERAL:alert, no distress and comfortable.  Examination is limited due to her body habitus SKIN: skin  color, texture, turgor are normal, no rashes or significant lesions EYES: normal, Conjunctiva are pink and non-injected, sclera clear OROPHARYNX:no exudate, no erythema and lips, buccal mucosa, and tongue normal  NECK: supple, thyroid normal size, non-tender, without nodularity LYMPH:  no palpable lymphadenopathy in the cervical, axillary or inguinal LUNGS: clear to auscultation and percussion with normal breathing effort HEART: regular rate & rhythm and no murmurs and no lower extremity edema ABDOMEN:abdomen soft, non-tender and normal bowel sounds Musculoskeletal:no cyanosis of digits and no clubbing  NEURO: alert & oriented x  3 with fluent speech, no focal motor/sensory deficits  LABORATORY DATA:  I have reviewed the data as listed    Component Value Date/Time   NA 140 07/18/2019 0811   NA 138 07/24/2017 1105   K 4.3 07/18/2019 0811   K 4.2 07/24/2017 1105   CL 102 07/18/2019 0811   CL 106 11/19/2012 0837   CO2 27 07/18/2019 0811   CO2 26 07/24/2017 1105   GLUCOSE 374 (H) 07/18/2019 0811   GLUCOSE 362 (H) 07/24/2017 1105   GLUCOSE 107 (H) 11/19/2012 0837   BUN 9 07/18/2019 0811   BUN 10.0 07/24/2017 1105   CREATININE 0.85 07/18/2019 0811   CREATININE 0.91 10/22/2018 0809   CREATININE 0.8 07/24/2017 1105   CALCIUM 9.1 07/18/2019 0811   CALCIUM 8.8 07/24/2017 1105   PROT 6.6 07/18/2019 0811   PROT 6.3 (L) 07/24/2017 1105   ALBUMIN 3.7 07/18/2019 0811   ALBUMIN 3.4 (L) 07/24/2017 1105   AST 12 (L) 07/18/2019 0811   AST 11 (L) 10/22/2018 0809   AST 13 07/24/2017 1105   ALT 12 07/18/2019 0811   ALT 16 10/22/2018 0809   ALT 21 07/24/2017 1105   ALKPHOS 120 07/18/2019 0811   ALKPHOS 112 07/24/2017 1105   BILITOT 0.6 07/18/2019 0811   BILITOT 0.7 10/22/2018 0809   BILITOT 0.55 07/24/2017 1105   GFRNONAA >60 07/18/2019 0811   GFRNONAA >60 10/22/2018 0809   GFRAA >60 07/18/2019 0811   GFRAA >60 10/22/2018 0809    No results found for: SPEP, UPEP  Lab Results  Component Value Date   WBC 28.5 (H) 07/18/2019   NEUTROABS 4.9 07/18/2019   HGB 12.1 07/18/2019   HCT 38.8 07/18/2019   MCV 74.2 (L) 07/18/2019   PLT 163 07/18/2019      Chemistry      Component Value Date/Time   NA 140 07/18/2019 0811   NA 138 07/24/2017 1105   K 4.3 07/18/2019 0811   K 4.2 07/24/2017 1105   CL 102 07/18/2019 0811   CL 106 11/19/2012 0837   CO2 27 07/18/2019 0811   CO2 26 07/24/2017 1105   BUN 9 07/18/2019 0811   BUN 10.0 07/24/2017 1105   CREATININE 0.85 07/18/2019 0811   CREATININE 0.91 10/22/2018 0809   CREATININE 0.8 07/24/2017 1105      Component Value Date/Time   CALCIUM 9.1 07/18/2019 0811    CALCIUM 8.8 07/24/2017 1105   ALKPHOS 120 07/18/2019 0811   ALKPHOS 112 07/24/2017 1105   AST 12 (L) 07/18/2019 0811   AST 11 (L) 10/22/2018 0809   AST 13 07/24/2017 1105   ALT 12 07/18/2019 0811   ALT 16 10/22/2018 0809   ALT 21 07/24/2017 1105   BILITOT 0.6 07/18/2019 0811   BILITOT 0.7 10/22/2018 0809   BILITOT 0.55 07/24/2017 1105      All questions were answered. The patient knows to call the clinic with any problems, questions or concerns. No  barriers to learning was detected.  I spent 15 minutes counseling the patient face to face. The total time spent in the appointment was 20 minutes and more than 50% was on counseling and review of test results  Heath Lark, MD 07/18/2019 10:33 AM

## 2019-07-18 NOTE — Telephone Encounter (Signed)
-----   Message from Heath Lark, MD sent at 07/18/2019  9:23 AM EDT ----- Regarding: high blood sugar. She needs to contact her new PCP for earlier appt and management of her DM

## 2019-07-18 NOTE — Assessment & Plan Note (Signed)
Clinically, I did not feel any new lymphadenopathy She has worsening lymphocytosis but it has not affected her hemoglobin or platelet count She is educated to watch out for signs and symptoms of cancer recurrence She has appointment to see her primary care doctor in December and will get another CBC recheck then She will call me with test results I plan to see her in 4 months for further follow-up We discussed the importance of preventive care and reviewed the vaccination programs. She does not have any prior allergic reactions to influenza vaccination. She agrees to proceed with influenza vaccination today and we will administer it today at the clinic.

## 2019-07-18 NOTE — Assessment & Plan Note (Signed)
She has poorly controlled diabetes I am concerned about her cardiovascular risk factors I recommend her to contact her primary care doctor for medical management

## 2019-07-20 ENCOUNTER — Telehealth: Payer: Self-pay | Admitting: Hematology and Oncology

## 2019-07-20 NOTE — Telephone Encounter (Signed)
I could not reach patient regarding schedule  °

## 2019-09-15 ENCOUNTER — Other Ambulatory Visit (HOSPITAL_COMMUNITY)
Admission: RE | Admit: 2019-09-15 | Discharge: 2019-09-15 | Disposition: A | Discharge status: Home / Self Care | Payer: BC Managed Care – PPO | Source: Ambulatory Visit | Attending: Physician Assistant | Admitting: Physician Assistant

## 2019-09-15 ENCOUNTER — Other Ambulatory Visit: Payer: Self-pay

## 2019-09-15 ENCOUNTER — Ambulatory Visit
Admission: RE | Admit: 2019-09-15 | Discharge: 2019-09-15 | Disposition: A | Discharge status: Home / Self Care | Payer: BC Managed Care – PPO | Source: Ambulatory Visit | Attending: Internal Medicine | Admitting: Internal Medicine

## 2019-09-15 ENCOUNTER — Other Ambulatory Visit: Payer: Self-pay | Admitting: Physician Assistant

## 2019-09-15 DIAGNOSIS — Z1231 Encounter for screening mammogram for malignant neoplasm of breast: Secondary | ICD-10-CM

## 2019-09-15 DIAGNOSIS — Z01419 Encounter for gynecological examination (general) (routine) without abnormal findings: Secondary | ICD-10-CM | POA: Insufficient documentation

## 2019-09-16 ENCOUNTER — Other Ambulatory Visit: Payer: Self-pay | Admitting: Physician Assistant

## 2019-09-16 DIAGNOSIS — R29890 Loss of height: Secondary | ICD-10-CM

## 2019-09-16 DIAGNOSIS — E2839 Other primary ovarian failure: Secondary | ICD-10-CM

## 2019-09-19 LAB — CYTOLOGY - PAP: Diagnosis: NEGATIVE

## 2019-09-20 ENCOUNTER — Telehealth: Payer: Self-pay | Admitting: *Deleted

## 2019-09-20 NOTE — Telephone Encounter (Signed)
A message was left, re: her new patient appointment. 

## 2019-09-23 ENCOUNTER — Other Ambulatory Visit: Payer: Self-pay | Admitting: Physician Assistant

## 2019-09-23 DIAGNOSIS — E041 Nontoxic single thyroid nodule: Secondary | ICD-10-CM

## 2019-10-04 ENCOUNTER — Other Ambulatory Visit: Payer: BC Managed Care – PPO

## 2019-10-10 ENCOUNTER — Ambulatory Visit
Admission: RE | Admit: 2019-10-10 | Discharge: 2019-10-10 | Disposition: A | Discharge status: Home / Self Care | Payer: BC Managed Care – PPO | Source: Ambulatory Visit | Attending: Physician Assistant | Admitting: Physician Assistant

## 2019-10-10 DIAGNOSIS — E041 Nontoxic single thyroid nodule: Secondary | ICD-10-CM

## 2019-10-27 ENCOUNTER — Other Ambulatory Visit: Payer: Self-pay | Admitting: Physician Assistant

## 2019-11-03 ENCOUNTER — Other Ambulatory Visit: Payer: Self-pay

## 2019-11-03 ENCOUNTER — Ambulatory Visit (INDEPENDENT_AMBULATORY_CARE_PROVIDER_SITE_OTHER): Payer: 59 | Admitting: Cardiovascular Disease

## 2019-11-03 ENCOUNTER — Encounter: Payer: Self-pay | Admitting: Cardiovascular Disease

## 2019-11-03 VITALS — BP 112/78 | HR 88 | Temp 96.9°F | Ht 67.0 in | Wt 254.2 lb

## 2019-11-03 DIAGNOSIS — I214 Non-ST elevation (NSTEMI) myocardial infarction: Secondary | ICD-10-CM | POA: Diagnosis not present

## 2019-11-03 DIAGNOSIS — E119 Type 2 diabetes mellitus without complications: Secondary | ICD-10-CM | POA: Diagnosis not present

## 2019-11-03 DIAGNOSIS — I10 Essential (primary) hypertension: Secondary | ICD-10-CM

## 2019-11-03 DIAGNOSIS — E785 Hyperlipidemia, unspecified: Secondary | ICD-10-CM | POA: Diagnosis not present

## 2019-11-03 DIAGNOSIS — C8307 Small cell B-cell lymphoma, spleen: Secondary | ICD-10-CM

## 2019-11-03 NOTE — Progress Notes (Signed)
Cardiology Office Note    Date:  11/03/2019   ID:  [name], DOB [birth year], MRN [id number]  PCP:  Lennie Odor, PA-C  Cardiologist:  Shelva Majestic, MD   3 year follow-up   History of Present Illness:  Desiree [name] is a 54 y.o. female who has a history of type 2 diabetes mellitus, obesity, splenic marginal zone B-cell lymphoma in remission, anemia, dyslipidemia, and hypertension.  She was admitted to [facility] hospital on April 08, 2016  in the setting of a non-ST segment elevation MI.  Cardiac catheterization revealed 85% focal stenosis in the mid LAD for which she underwent successful stenting with a Promus premier 2.2512 mm DES stent.  She had mild concomitant CAD with 20% irregularity in the mid RCA and 50% PDA stenoses.  An echo Doppler study showed an EF of 55-60% with mild LVH and Grade 1 diastolic dysfunction.  She was discharged on dual antiplatelet therapy and was seen by Kathlene November, PAC on 04/17/2016 for initial follow-up evaluation.  She had been on only low-dose statin at 10 mg with a history of leg cramping.  When I saw her for initial office evaluation she denied any episodes of chest pain.  She had been on losartan 100 mg and metoprolol 12.5 mg twice a day.  She was not well beta blocker tonight titrated this to 25 mg twice a day.  She also had leg swelling and I recommended she increase her Lasix.  She had recently gone to the emergency room in [place], [place] with atypical symptoms and was felt to have dyspepsia in etiology of her discomfort.  She dis not have any exertional chest pain.  She is diabetic on insulin and metformin.  On aspirin and Brilinta for dual antiplatelet therapy.    When I saw her in follow-up , her resting pulse was elevated and I titrated metoprolol from 50 g twice a day to 75 mg in the morning and 50 mg at night.  She continued to be on losartan 100 mg daily.  I had previously changed her to atorvastatin 80 mg in addition to omega-3 fatty  acids and subsequent laboratory showed improvement in LDL down to 52.  She has not noticed any significant change in her pulse.  At times, she has been awakened with a sensation of her heart racing and believes she may awaken gasping for breath.  She has been trying to walk at least 5 days per week up to 30 minutes at a time.  Typically she goes to bed at 9:30 and wakes up at 5:30.  She continues to notice some feet and ankle swelling in the latter part of the day and has been taking her Lasix dose before going to bed.  She was recently iron deficient and has undergone a 13 infusions and will be having another one in the near future.    I last saw her in April 2018.  At that time, due to concerns for potential sleep apnea I recommended she undergo a sleep study.  She had a home sleep study done in July 2018 and was found to have mild overall sleep apnea with an AHI at 5.3/h.  There was moderate oxygen desaturation to a nadir of 85%.  She snored for 18% of her study.  She followed up with initial recommendation for CPAP titration.  Over the last 3 years, she states that she has been sleeping fairly well.  Occasions have been adjusted.  She stopped taking Brilinta approximately  a year and a half ago.  She also self discontinued atorvastatin which previously had been 80 mg daily due to development of leg myalgias.  Is been followed by Dr. Alvy Bimler for her non-Hodgkin's lymphoma.  He is followed at [facility] for primary care and recently has seen Claudie Leach, PA.  Laboratory in December 2020 showed a total cholesterol 185 and LDL cholesterol 126.  HDL was low at 33 and triglycerides were 140.  She was started on Nexletol 180 mg has been on this therapy for close to a month.  Over the years, her losartan dose been reduced and she now is only on 25 mg.  She continues to take metoprolol tartrate 100 mg twice a day and she has been taking furosemide 40 mg on a as needed basis for leg swelling and often takes this  approximately 3 times per week.  Presently, she denies any chest pain.  She is not been exercising as much as she had in her past.  She has been working virtually for [employer].  She presents for follow-up evaluation.   Past Medical History:  Diagnosis Date  . 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 Diabetes ([place])   . Fatigue 11/17/2013  . Female infertility   . High cholesterol   . Hx of heart artery stent   . Hypertension   . Iron deficiency anemia due to chronic blood loss   . Leukocytosis 07/27/2011  . Myocardial infarction ([place]) 03/2016  . Non Hodgkin's lymphoma ([place])    recieving chemo 06/2014  . Palpitation 06/30/2014  . Splenic marginal zone b-cell lymphoma ([place]) 01/2009   On observation  . Tachycardia with 100 - 120 beats per minute 11/29/2014  . Type II diabetes mellitus ([place])     Past Surgical History:  Procedure Laterality Date  . BONE MARROW BIOPSY  0215 X 2  . CARDIAC CATHETERIZATION N/A 04/09/2016   Procedure: Left Heart Cath and Coronary Angiography;  Surgeon: Peter M Martinique, MD;  Location: [place] [facility];  Service: Cardiovascular;  Laterality: N/A;  . CARDIAC CATHETERIZATION N/A 04/09/2016   Procedure: Coronary Stent Intervention;  Surgeon: Peter M Martinique, MD;  Location: [place] [facility];  Service: Cardiovascular;  Laterality: N/A;  . CARDIAC CATHETERIZATION N/A 10/16/2016   Procedure: Left Heart Cath and Coronary Angiography;  Surgeon: Peter M Martinique, MD;  Location: [place] [facility];  Service: Cardiovascular;  Laterality: N/A;  . CARDIAC CATHETERIZATION N/A 10/16/2016   Procedure: Coronary Stent Intervention;  Surgeon: Peter M Martinique, MD;  Location: [place] [facility];  Service: Cardiovascular;  Laterality: N/A;  . CARDIAC CATHETERIZATION N/A 10/16/2016   Procedure: Intravascular Ultrasound/IVUS;  Surgeon: Peter M Martinique, MD;  Location: [place] [facility];  Service: Cardiovascular;  Laterality: N/A;  . WISDOM TOOTH EXTRACTION      Current  Medications: Outpatient Medications Prior to Visit  Medication Sig Dispense Refill  . acetaminophen (TYLENOL) 500 MG tablet Take 1 tablet (500 mg total) by mouth every 8 (eight) hours as needed (pain). 30 tablet 0  . aspirin 81 MG chewable tablet Chew 1 tablet (81 mg total) by mouth daily. 30 tablet 0  . Bempedoic Acid (NEXLETOL) 180 MG TABS Take 1 tablet by mouth daily.    . fish oil-omega-3 fatty acids 1000 MG capsule Take 1 g by mouth 2 (two) times daily.     . furosemide (LASIX) 40 MG tablet Take 40 mg by mouth as needed.    Marland Kitchen losartan (COZAAR) 25 MG  tablet Take 25 mg by mouth daily.    . metFORMIN (GLUCOPHAGE) 1000 MG tablet Take 1,000 mg by mouth 2 (two) times daily with a meal.      . metoprolol tartrate (LOPRESSOR) 100 MG tablet Take 1 tablet (100 mg total) by mouth 2 (two) times daily. PT OVERDUE FOR OV PLEASE CALL FOR APPT 60 tablet 0  . nitroGLYCERIN (NITROSTAT) 0.4 MG SL tablet Place 1 tablet (0.4 mg total) under the tongue every 5 (five) minutes as needed for chest pain. 30 tablet 0  . valACYclovir (VALTREX) 500 MG tablet Take 500 mg by mouth 2 (two) times daily as needed. Takes for outbreaks only.    Marland Kitchen BRILINTA 90 MG TABS tablet TAKE 1 TABLET BY MOUTH TWICE A DAY 60 tablet 4  . furosemide (LASIX) 20 MG tablet Take 1 tablet (20 mg total) by mouth daily. 30 tablet 3  . insulin aspart (NOVOLOG FLEXPEN) 100 UNIT/ML FlexPen Before each meal 3 times a day, 140-199 - 2 units, 200-250 - 4 units, 251-299 - 6 units,  300-349 - 8 units,  350 or above 10 units. Insulin PEN if approved, provide syringes and needles if needed. 15 mL 1  . losartan (COZAAR) 100 MG tablet Take 100 mg by mouth daily.    Marland Kitchen atorvastatin (LIPITOR) 80 MG tablet Take 1 tablet (80 mg total) by mouth daily at 6 PM. (Patient not taking: Reported on 12/21/2018) 30 tablet 12  . pantoprazole (PROTONIX) 40 MG tablet Take 1 tablet (40 mg total) by mouth daily. (Patient not taking: Reported on 12/21/2018) 30 tablet 12  . Potassium  Chloride ER 20 MEQ TBCR Take 20 mEq by mouth daily.     . Prenatal Vit-Fe Fumarate-FA (MULTIVITAMIN-PRENATAL) 27-0.8 MG TABS tablet Take 2 tablets by mouth daily at 12 noon.    . ticagrelor (BRILINTA) 90 MG TABS tablet Take 1 tablet (90 mg total) by mouth 2 (two) times daily. (Patient not taking: Reported on 12/21/2018) 90 tablet 2   No facility-administered medications prior to visit.     Allergies:   Erythromycin, Lovastatin, and Tolnaftate   Social History   Socioeconomic History  . Marital status: Married    Spouse name: Alphonso  . Number of children: Not on file  . Years of education: Not on file  . Highest education level: Not on file  Occupational History  . Occupation: [employer]  Tobacco Use  . Smoking status: Former Smoker    Packs/day: 0.50    Years: 22.00    Pack years: 11.00    Types: Cigarettes    Quit date: 10/13/2005    Years since quitting: 14.0  . Smokeless tobacco: Never Used  Substance and Sexual Activity  . Alcohol use: Yes    Comment: 10/15/2016 "nothing in the last 3 years"  . Drug use: No  . Sexual activity: Yes    Birth control/protection: None  Other Topics Concern  . Not on file  Social History Narrative  . Not on file   Social Determinants of Health   Financial Resource Strain:   . Difficulty of Paying Living Expenses: Not on file  Food Insecurity:   . Worried About Charity fundraiser in the Last Year: Not on file  . Ran Out of Food in the Last Year: Not on file  Transportation Needs:   . Lack of Transportation (Medical): Not on file  . Lack of Transportation (Non-Medical): Not on file  Physical Activity:   . Days of Exercise  per Week: Not on file  . Minutes of Exercise per Session: Not on file  Stress:   . Feeling of Stress : Not on file  Social Connections:   . Frequency of Communication with Friends and Family: Not on file  . Frequency of Social Gatherings with Friends and Family: Not on file  . Attends Religious Services:  Not on file  . Active Member of Clubs or Organizations: Not on file  . Attends Archivist Meetings: Not on file  . Marital Status: Not on file     Family History:  The patient's family history includes Breast cancer in her maternal aunt; Cancer in her maternal aunt, maternal uncle, and mother; Diabetes in her mother; Heart disease in her mother; High blood pressure in her mother.   ROS General: Positive for obesity;No fevers, chills, or night sweats;  HEENT: Negative; No changes in vision or hearing, sinus congestion, difficulty swallowing Pulmonary: Negative; No cough, wheezing, shortness of breath, hemoptysis Cardiovascular: Negative; No chest pain, presyncope, syncope, palpitations Positive for intermittent ankle swelling GI: Negative; No nausea, vomiting, diarrhea, or abdominal pain GU: Negative; No dysuria, hematuria, or difficulty voiding Musculoskeletal: Negative; no myalgias, joint pain, or weakness Hematologic/Oncology: Positive for non-Hodgkin's lymphoma Endocrine: Positive for diabetes mellitus Neuro: Negative; no changes in balance, headaches Skin: Negative; No rashes or skin lesions Psychiatric: Negative; No behavioral problems, depression Sleep: Negative; No snoring, daytime sleepiness, hypersomnolence, bruxism, restless legs, hypnogognic hallucinations, no cataplexy Other comprehensive 14 point system review is negative.   PHYSICAL EXAM:   VS:  BP 112/78   Pulse 88   Temp (!) 96.9 F (36.1 C)   Ht '5\' 7"'$  (1.702 m)   Wt 254 lb 3.2 oz (115.3 kg)   LMP 01/20/2017   SpO2 100%   BMI 39.81 kg/m     Repeat blood pressure by me 124/78  Wt Readings from Last 3 Encounters:  11/03/19 254 lb 3.2 oz (115.3 kg)  07/18/19 257 lb 4.8 oz (116.7 kg)  12/21/18 258 lb (117 kg)    General: Alert, oriented, no distress.  Skin: normal turgor, no rashes, warm and dry HEENT: Normocephalic, atraumatic. Pupils equal round and reactive to light; sclera anicteric;  extraocular muscles intact;  Nose without nasal septal hypertrophy Mouth/Parynx benign; Mallinpatti scale 3 Neck: No JVD, no carotid bruits; normal carotid upstroke Lungs: clear to ausculatation and percussion; no wheezing or rales Chest wall: without tenderness to palpitation Heart: PMI not displaced, RRR, s1 s2 normal, 1/6 systolic murmur, no diastolic murmur, no rubs, gallops, thrills, or heaves Abdomen: Central adiposity soft, nontender; no hepatosplenomehaly, BS+; abdominal aorta nontender and not dilated by palpation. Back: no CVA tenderness Pulses 2+ Musculoskeletal: full range of motion, normal strength, no joint deformities Extremities: no clubbing cyanosis or edema, Homan's sign negative  Neurologic: grossly nonfocal; Cranial nerves grossly wnl Psychologic: Normal mood and affect   Studies/Labs Reviewed:   ECG (independently read by me): Normal sinus rhythm at 88 bpm.  Nonspecific T wave abnormality in lead III.  April 2018 ECG (independently read by me): Normal sinus rhythm at 87 bpm.  Nonspecific T changes.  October 2017 ECG (independently read by me): Sinus rhythm at 95 bpm with T-wave inversion in lead 3.  Recent Labs: BMP Latest Ref Rng & Units 07/18/2019 10/22/2018 07/23/2018  Glucose 70 - 99 mg/dL 374(H) 243(H) 310(H)  BUN 6 - 20 mg/dL '9 13 12  '$ Creatinine 0.44 - 1.00 mg/dL 0.85 0.91 0.81  Sodium 135 - 145 mmol/L 140  140 140  Potassium 3.5 - 5.1 mmol/L 4.3 3.9 4.2  Chloride 98 - 111 mmol/L 102 101 100  CO2 22 - 32 mmol/L '27 28 30  '$ Calcium 8.9 - 10.3 mg/dL 9.1 9.2 9.4     Hepatic Function Latest Ref Rng & Units 07/18/2019 10/22/2018 07/23/2018  Total Protein 6.5 - 8.1 g/dL 6.6 7.0 7.1  Albumin 3.5 - 5.0 g/dL 3.7 3.7 3.8  AST 15 - 41 U/L 12(L) 11(L) 9(L)  ALT 0 - 44 U/L '12 16 12  '$ Alk Phosphatase 38 - 126 U/L 120 109 120  Total Bilirubin 0.3 - 1.2 mg/dL 0.6 0.7 0.5  Bilirubin, Direct 0.1 - 0.5 mg/dL - - -    CBC Latest Ref Rng & Units 07/18/2019 10/22/2018  07/23/2018  WBC 4.0 - 10.5 K/uL 28.5(H) 16.2(H) 17.0(H)  Hemoglobin 12.0 - 15.0 g/dL 12.1 13.6 13.4  Hematocrit 36.0 - 46.0 % 38.8 43.0 42.4  Platelets 150 - 400 K/uL 163 173 177   Lab Results  Component Value Date   MCV 74.2 (L) 07/18/2019   MCV 76.4 (L) 10/22/2018   MCV 77.9 (L) 07/23/2018   Lab Results  Component Value Date   TSH 2.650 12/21/2018   Lab Results  Component Value Date   HGBA1C 11.7 (H) 12/21/2018     BNP No results found for: BNP  ProBNP No results found for: PROBNP   Lipid Panel     Component Value Date/Time   CHOL 172 12/21/2018 0952   TRIG 106 12/21/2018 0952   HDL 33 (L) 12/21/2018 0952   CHOLHDL 3.9 10/16/2016 0234   VLDL 26 10/16/2016 0234   LDLCALC 118 (H) 12/21/2018 0952     RADIOLOGY: US THYROID  Result Date: 10/10/2019 CLINICAL DATA:  54 year old female with a history of thyroid nodules EXAM: THYROID ULTRASOUND TECHNIQUE: Ultrasound examination of the thyroid gland and adjacent soft tissues was performed. COMPARISON:  03/18/2018 FINDINGS: Parenchymal Echotexture: Mildly heterogenous Isthmus: 0.2 cm Right lobe: 4.7 cm x 1.6 cm x 1.8 cm Left lobe: 5.0 cm x 2.7 cm x 3.1 cm _________________________________________________________ Estimated total number of nodules >/= 1 cm: 2 Number of spongiform nodules >/=  2 cm not described below (TR1): 0 Number of mixed cystic and solid nodules >/= 1.5 cm not described below (TR2): 0 _________________________________________________________ Nodule labeled 1 in the inferior right thyroid measures 6 mm, and does not meet criteria for surveillance or biopsy. Nodule labeled 3 in the mid left thyroid measures 1.0 cm with spongiform characteristics and does not meet criteria for surveillance or biopsy. Nodule labeled 4 in the inferior left thyroid, previously 4.1 cm, currently 3.9 cm, TR 4 characteristics. This meets criteria for biopsy if not already performed. No adenopathy IMPRESSION: Multinodular thyroid.  Inferior left thyroid nodule (labeled 4) meets criteria for biopsy, as designated by the newly established ACR TI-RADS criteria. If not already performed, referral for biopsy recommended. If a biopsy has been performed with benign pathology, no further specific follow-up would be indicated. Recommendations follow those established by the new ACR TI-RADS criteria (J Am Coll Radiol 2725;36:644-034). Electronically Signed   By: Corrie Mckusick D.O.   On: 10/10/2019 16:34     Additional studies/ records that were reviewed today include:  I had reviewed her 18 through June 29 hospitalization and discharge summary; cardiac catheterization data, echo Doppler study, and PA follow-up office visits.  Laboratory from January 2018 was reviewed.   ASSESSMENT:    1. NSTEMI (non-ST elevated myocardial infarction) [facility]): April 08, 2016, DES stent to mid LAD   2. Essential hypertension   3. Hyperlipidemia LDL goal <70   4. Type 2 diabetes mellitus without complication, without long-term current use of insulin ([place])   5. Morbid obesity ([place])   6. Splenic marginal zone b-cell lymphoma [facility])     PLAN:  Desiree [name] is a 18 -year-old obese African-American female who suffered a non-ST segment elevation myocardial infarction on 04/08/2016 and on 04/09/2016 underwent successful DES stenting to a high-grade mid LAD stenosis.  She has mild concomitant CAD in her RCA.  She has normal LV function and did not sustain significant damage or wall motion abnormality.  Since I last saw her her medications have been adjusted.  She is no longer on DAPT therapy and continues to be on aspirin alone for antiplatelet benefit.  At present she is not having any anginal symptomatology on a regimen consisting of metoprolol tartrate 100 mg twice a day.  She is now on a reduced dose of losartan 25 mg.  Her blood pressure today remains stable.  I discussed with her the importance of aggressively treating her lipids with target LDL less  than 70.  She previously had been on atorvastatin 80 mg but developed myalgias leading to her self discontinuance over a year ago.  On recent laboratory in September 15, 2019 LDL cholesterol was increased to 126 and she was started on bempedoic acid 180 mg.  I have recommended that her primary physician recheck laboratory in 3 months and if at that time she is not at target the bempedoic acid can be changed to the combination drug which also has Zetia 10 mg.  She has diabetes mellitus since 2008.  She is on low-dose ARB therapy for renal protection.  She continues to be on metformin 1000 mg twice a day.  She is morbidly obese.  We discussed weight loss and increased exercise at least walking 5 days/week for up to 30 minutes if at all possible.  With reference to her mild sleep apnea, she is sleeping well and believes her sleep is restorative.  There is no residual daytime sleepiness.  She is unaware of snoring.  She is followed by Dr. Alvy Bimler for her non-Hodgkin's lymphoma.  I will see her in 1 year for follow-up Cardiologic evaluation or sooner if problems arise.    Medication Adjustments/Labs and Tests Ordered: Current medicines are reviewed at length with the patient today.  Concerns regarding medicines are outlined above.  Medication changes, Labs and Tests ordered today are listed in the Patient Instructions below. Patient Instructions  Medication Instructions:  Continue current medications  *If you need a refill on your cardiac medications before your next appointment, please call your pharmacy*  Lab Work: None Ordered  Testing/Procedures: None Ordered  Follow-Up: At [facility], you and your health needs are our priority.  As part of our continuing mission to provide you with exceptional heart care, we have created designated Provider Care Teams.  These Care Teams include your primary Cardiologist (physician) and Advanced Practice Providers (APPs -  Physician Assistants and Nurse  Practitioners) who all work together to provide you with the care you need, when you need it.  Your next appointment:   1 year(s)  The format for your next appointment:   In Person  Provider:   Shelva Majestic, MD      Signed, Shelva Majestic, MD, [facility]  11/03/2019 8:38 AM    [facility] [address], [address], [place], [place]  [address] Phone: [phone]    

## 2019-11-03 NOTE — Patient Instructions (Signed)
Medication Instructions:  Continue current medications  *If you need a refill on your cardiac medications before your next appointment, please call your pharmacy*  Lab Work: None Ordered  Testing/Procedures: None Ordered  Follow-Up: At [facility], you and your health needs are our priority.  As part of our continuing mission to provide you with exceptional heart care, we have created designated Provider Care Teams.  These Care Teams include your primary Cardiologist (physician) and Advanced Practice Providers (APPs -  Physician Assistants and Nurse Practitioners) who all work together to provide you with the care you need, when you need it.  Your next appointment:   1 year(s)  The format for your next appointment:   In Person  Provider:   Crysten Kaman Kelly, MD    

## 2019-11-17 ENCOUNTER — Other Ambulatory Visit: Payer: Self-pay | Admitting: Hematology and Oncology

## 2019-11-17 DIAGNOSIS — C8307 Small cell B-cell lymphoma, spleen: Secondary | ICD-10-CM

## 2019-11-18 ENCOUNTER — Encounter: Payer: Self-pay | Admitting: Hematology and Oncology

## 2019-11-18 ENCOUNTER — Inpatient Hospital Stay: Payer: 59 | Attending: Hematology and Oncology | Admitting: Hematology and Oncology

## 2019-11-18 ENCOUNTER — Inpatient Hospital Stay: Payer: 59

## 2019-11-18 ENCOUNTER — Other Ambulatory Visit: Payer: Self-pay

## 2019-11-18 ENCOUNTER — Telehealth: Payer: Self-pay | Admitting: *Deleted

## 2019-11-18 DIAGNOSIS — Z8572 Personal history of non-Hodgkin lymphomas: Secondary | ICD-10-CM | POA: Insufficient documentation

## 2019-11-18 DIAGNOSIS — C8307 Small cell B-cell lymphoma, spleen: Secondary | ICD-10-CM

## 2019-11-18 DIAGNOSIS — D630 Anemia in neoplastic disease: Secondary | ICD-10-CM

## 2019-11-18 DIAGNOSIS — E1165 Type 2 diabetes mellitus with hyperglycemia: Secondary | ICD-10-CM | POA: Insufficient documentation

## 2019-11-18 LAB — CBC WITH DIFFERENTIAL/PLATELET
Abs Immature Granulocytes: 0.19 10*3/uL — ABNORMAL HIGH (ref 0.00–0.07)
Basophils Absolute: 0.2 10*3/uL — ABNORMAL HIGH (ref 0.0–0.1)
Basophils Relative: 0 %
Eosinophils Absolute: 0.2 10*3/uL (ref 0.0–0.5)
Eosinophils Relative: 1 %
HCT: 37.7 % (ref 36.0–46.0)
Hemoglobin: 11.5 g/dL — ABNORMAL LOW (ref 12.0–15.0)
Immature Granulocytes: 1 %
Lymphocytes Relative: 81 %
Lymphs Abs: 30 10*3/uL — ABNORMAL HIGH (ref 0.7–4.0)
MCH: 22.5 pg — ABNORMAL LOW (ref 26.0–34.0)
MCHC: 30.5 g/dL (ref 30.0–36.0)
MCV: 73.6 fL — ABNORMAL LOW (ref 80.0–100.0)
Monocytes Absolute: 1.1 10*3/uL — ABNORMAL HIGH (ref 0.1–1.0)
Monocytes Relative: 3 %
Neutro Abs: 5 10*3/uL (ref 1.7–7.7)
Neutrophils Relative %: 14 %
Platelets: 164 10*3/uL (ref 150–400)
RBC: 5.12 MIL/uL — ABNORMAL HIGH (ref 3.87–5.11)
RDW: 15.4 % (ref 11.5–15.5)
WBC: 36.7 10*3/uL — ABNORMAL HIGH (ref 4.0–10.5)
nRBC: 0 % (ref 0.0–0.2)

## 2019-11-18 LAB — COMPREHENSIVE METABOLIC PANEL
ALT: 15 U/L (ref 0–44)
AST: 12 U/L — ABNORMAL LOW (ref 15–41)
Albumin: 3.8 g/dL (ref 3.5–5.0)
Alkaline Phosphatase: 106 U/L (ref 38–126)
Anion gap: 12 (ref 5–15)
BUN: 15 mg/dL (ref 6–20)
CO2: 28 mmol/L (ref 22–32)
Calcium: 9.2 mg/dL (ref 8.9–10.3)
Chloride: 97 mmol/L — ABNORMAL LOW (ref 98–111)
Creatinine, Ser: 0.97 mg/dL (ref 0.44–1.00)
GFR calc Af Amer: >60 mL/min (ref >60)
GFR calc non Af Amer: >60 mL/min (ref >60)
Glucose, Bld: 408 mg/dL — ABNORMAL HIGH (ref 70–99)
Potassium: 4.2 mmol/L (ref 3.5–5.1)
Sodium: 137 mmol/L (ref 135–145)
Total Bilirubin: 0.6 mg/dL (ref 0.3–1.2)
Total Protein: 6.9 g/dL (ref 6.5–8.1)

## 2019-11-18 LAB — LACTATE DEHYDROGENASE: LDH: 243 U/L — ABNORMAL HIGH (ref 98–192)

## 2019-11-18 NOTE — Telephone Encounter (Signed)
Patient returned call. Advised information about blood glucose. Patient states she is in contact with her PCP to better control glucose. Patient is to begin a new medication this week for diabetes.

## 2019-11-18 NOTE — Assessment & Plan Note (Signed)
She has uncontrolled diabetes She has appointment to follow with her primary care doctor for medical management

## 2019-11-18 NOTE — Progress Notes (Signed)
[facility] OFFICE PROGRESS NOTE  Patient Care Team: Lennie Odor, [place] as PCP - General (Nurse Practitioner) Heath Lark, MD as Consulting Physician (Hematology and Oncology)  ASSESSMENT & PLAN:  Splenic marginal zone b-cell lymphoma ([place]) She has rising white blood cell count but remained asymptomatic There is no doubt, she will likely have obvious disease in the near future requiring treatment I am very concerned about her poorly controlled diabetes that will impact on treatment decisions in the future I spent some time educating the patient the importance of getting her diabetes under control I plan to see her again in 3 months for further follow-up She is educated to watch for signs and symptoms of disease such as new lymphadenopathy or infection  Diabetes mellitus type 2, uncontrolled ([place]) She has uncontrolled diabetes She has appointment to follow with her primary care doctor for medical management  Anemia in neoplastic disease This is likely anemia of chronic disease. The patient denies recent history of bleeding such as epistaxis, hematuria or hematochezia. She is asymptomatic from the anemia. We will observe for now.   No orders of the defined types were placed in this encounter.   All questions were answered. The patient knows to call the clinic with any problems, questions or concerns. The total time spent in the appointment was 20 minutes encounter with patients including review of chart and various tests results, discussions about plan of care and coordination of care plan   Heath Lark, MD 11/18/2019 9:29 AM  INTERVAL HISTORY: Please see below for problem oriented charting. She returns for further follow-up due to history of lymphoma The patient continues to struggle with diabetes control and her weight Her recent hemoglobin A1c was 10% When I go through the diet with the patient, she did have occasional carb load She stated that she have difficulties  controlling her urges at times and tends to eat through emotional turmoil She weighs herself daily She denies recent lymphadenopathy No recent changes in appetite or weight No recent infection, fever or chills No recent chest pain or shortness of breath  SUMMARY OF ONCOLOGIC HISTORY: Oncology History  Splenic marginal zone b-cell lymphoma ([place])  01/11/2009 Initial Diagnosis   Splenic marginal zone b-cell lymphoma   06/06/2014 Bone Marrow Biopsy   Bone marrow aspirate and biopsy confirmed extensive involvement by CD20 positive non-Hodgkin's lymphoma.   06/07/2014 Imaging   She had a PET scan which showed predominant splenic involvement.   06/16/2014 - 07/07/2014 Chemotherapy   She started on weekly rituximab.   06/16/2014 Adverse Reaction   She had infusion reaction, resolved with IV dexamethasone.   08/18/2014 Imaging   PET CT scan show resolution of hypermetabolic activity.   04/08/2016 Imaging   1. No evidence for aortic dissection or vascular injury. 2. Mild atherosclerotic changes within the abdominal aorta and branch vessels without aneurysm. 3. No significant adenopathy or evidence for recurrent lymphoma. 4. Stable mild splenomegaly. 5. 5 mm nodule in the right lower lobe demonstrates interval increase in size. No follow-up needed if patient is low-risk. Non-contrast chest CT can be considered in 12 months if patient is high-risk.  6. Degenerate changes at the SI joints bilaterally.   01/21/2018 Imaging   1. Persistent mild splenomegaly, similar to prior examinations. No other findings to suggest recurrent disease in the chest, abdomen or pelvis. 2. Aortic atherosclerosis, in addition to two vessel coronary artery disease. Please note that although the presence of coronary artery calcium documents the presence of coronary  artery disease, the severity of this disease and any potential stenosis cannot be assessed on this non-gated CT examination. Assessment for potential risk factor  modification, dietary therapy or pharmacologic therapy may be warranted, if clinically indicated.  3. Enlarging heterogeneously enhancing and partially calcified left thyroid lobe nodule. Further evaluation with nonemergent thyroid ultrasound is recommended in the near future to better evaluate this lesion and determine if there is a need for fine-needle aspiration. 4. Additional incidental findings, as above.     REVIEW OF SYSTEMS:   Constitutional: Denies fevers, chills or abnormal weight loss Eyes: Denies blurriness of vision Ears, nose, mouth, throat, and face: Denies mucositis or sore throat Respiratory: Denies cough, dyspnea or wheezes Cardiovascular: Denies palpitation, chest discomfort or lower extremity swelling Gastrointestinal:  Denies nausea, heartburn or change in bowel habits Skin: Denies abnormal skin rashes Lymphatics: Denies new lymphadenopathy or easy bruising Neurological:Denies numbness, tingling or new weaknesses Behavioral/Psych: Mood is stable, no new changes  All other systems were reviewed with the patient and are negative.  I have reviewed the past medical history, past surgical history, social history and family history with the patient and they are unchanged from previous note.  ALLERGIES:  is allergic to erythromycin; lovastatin; and tolnaftate.  MEDICATIONS:  Current Outpatient Medications  Medication Sig Dispense Refill  . acetaminophen (TYLENOL) 500 MG tablet Take 1 tablet (500 mg total) by mouth every 8 (eight) hours as needed (pain). 30 tablet 0  . aspirin 81 MG chewable tablet Chew 1 tablet (81 mg total) by mouth daily. 30 tablet 0  . Bempedoic Acid (NEXLETOL) 180 MG TABS Take 1 tablet by mouth daily.    . fish oil-omega-3 fatty acids 1000 MG capsule Take 1 g by mouth 2 (two) times daily.     . furosemide (LASIX) 40 MG tablet Take 40 mg by mouth as needed.    Marland Kitchen losartan (COZAAR) 25 MG tablet Take 25 mg by mouth daily.    . metFORMIN (GLUCOPHAGE) 1000  MG tablet Take 1,000 mg by mouth 2 (two) times daily with a meal.      . metoprolol tartrate (LOPRESSOR) 100 MG tablet Take 1 tablet (100 mg total) by mouth 2 (two) times daily. PT OVERDUE FOR OV PLEASE CALL FOR APPT 60 tablet 0  . nitroGLYCERIN (NITROSTAT) 0.4 MG SL tablet Place 1 tablet (0.4 mg total) under the tongue every 5 (five) minutes as needed for chest pain. 30 tablet 0  . valACYclovir (VALTREX) 500 MG tablet Take 500 mg by mouth 2 (two) times daily as needed. Takes for outbreaks only.     No current facility-administered medications for this visit.    PHYSICAL EXAMINATION: ECOG PERFORMANCE STATUS: 0 - Asymptomatic  Vitals:   11/18/19 0914  BP: 121/73  Pulse: 91  Resp: 18  Temp: 97.8 F (36.6 C)  SpO2: 100%   Filed Weights   11/18/19 0914  Weight: 259 lb 6.4 oz (117.7 kg)    GENERAL:alert, no distress and comfortable SKIN: skin color, texture, turgor are normal, no rashes or significant lesions EYES: normal, Conjunctiva are pink and non-injected, sclera clear OROPHARYNX:no exudate, no erythema and lips, buccal mucosa, and tongue normal  NECK: supple, thyroid normal size, non-tender, without nodularity LYMPH:  no palpable lymphadenopathy in the cervical, axillary or inguinal LUNGS: clear to auscultation and percussion with normal breathing effort HEART: regular rate & rhythm and no murmurs and no lower extremity edema ABDOMEN:abdomen soft, non-tender and normal bowel sounds Musculoskeletal:no cyanosis of digits and no  clubbing  NEURO: alert & oriented x 3 with fluent speech, no focal motor/sensory deficits  LABORATORY DATA:  I have reviewed the data as listed    Component Value Date/Time   NA 137 11/18/2019 0846   NA 138 07/24/2017 1105   K 4.2 11/18/2019 0846   K 4.2 07/24/2017 1105   CL 97 (L) 11/18/2019 0846   CL 106 11/19/2012 0837   CO2 28 11/18/2019 0846   CO2 26 07/24/2017 1105   GLUCOSE 408 (H) 11/18/2019 0846   GLUCOSE 362 (H) 07/24/2017 1105    GLUCOSE 107 (H) 11/19/2012 0837   BUN 15 11/18/2019 0846   BUN 10.0 07/24/2017 1105   CREATININE 0.97 11/18/2019 0846   CREATININE 0.91 10/22/2018 0809   CREATININE 0.8 07/24/2017 1105   CALCIUM 9.2 11/18/2019 0846   CALCIUM 8.8 07/24/2017 1105   PROT 6.9 11/18/2019 0846   PROT 6.3 (L) 07/24/2017 1105   ALBUMIN 3.8 11/18/2019 0846   ALBUMIN 3.4 (L) 07/24/2017 1105   AST 12 (L) 11/18/2019 0846   AST 11 (L) 10/22/2018 0809   AST 13 07/24/2017 1105   ALT 15 11/18/2019 0846   ALT 16 10/22/2018 0809   ALT 21 07/24/2017 1105   ALKPHOS 106 11/18/2019 0846   ALKPHOS 112 07/24/2017 1105   BILITOT 0.6 11/18/2019 0846   BILITOT 0.7 10/22/2018 0809   BILITOT 0.55 07/24/2017 1105   GFRNONAA >60 11/18/2019 0846   GFRNONAA >60 10/22/2018 0809   GFRAA >60 11/18/2019 0846   GFRAA >60 10/22/2018 0809    No results found for: SPEP, UPEP  Lab Results  Component Value Date   WBC 36.7 (H) 11/18/2019   NEUTROABS 5.0 11/18/2019   HGB 11.5 (L) 11/18/2019   HCT 37.7 11/18/2019   MCV 73.6 (L) 11/18/2019   PLT 164 11/18/2019      Chemistry      Component Value Date/Time   NA 137 11/18/2019 0846   NA 138 07/24/2017 1105   K 4.2 11/18/2019 0846   K 4.2 07/24/2017 1105   CL 97 (L) 11/18/2019 0846   CL 106 11/19/2012 0837   CO2 28 11/18/2019 0846   CO2 26 07/24/2017 1105   BUN 15 11/18/2019 0846   BUN 10.0 07/24/2017 1105   CREATININE 0.97 11/18/2019 0846   CREATININE 0.91 10/22/2018 0809   CREATININE 0.8 07/24/2017 1105      Component Value Date/Time   CALCIUM 9.2 11/18/2019 0846   CALCIUM 8.8 07/24/2017 1105   ALKPHOS 106 11/18/2019 0846   ALKPHOS 112 07/24/2017 1105   AST 12 (L) 11/18/2019 0846   AST 11 (L) 10/22/2018 0809   AST 13 07/24/2017 1105   ALT 15 11/18/2019 0846   ALT 16 10/22/2018 0809   ALT 21 07/24/2017 1105   BILITOT 0.6 11/18/2019 0846   BILITOT 0.7 10/22/2018 0809   BILITOT 0.55 07/24/2017 1105

## 2019-11-18 NOTE — Assessment & Plan Note (Signed)
She has rising white blood cell count but remained asymptomatic There is no doubt, she will likely have obvious disease in the near future requiring treatment I am very concerned about her poorly controlled diabetes that will impact on treatment decisions in the future I spent some time educating the patient the importance of getting her diabetes under control I plan to see her again in 3 months for further follow-up She is educated to watch for signs and symptoms of disease such as new lymphadenopathy or infection

## 2019-11-18 NOTE — Telephone Encounter (Signed)
Telephone call to patient to advise blood glucose was greater than 400 she needs to see her PCP for diabetes management. LM for return call.

## 2019-11-18 NOTE — Assessment & Plan Note (Signed)
This is likely anemia of chronic disease. The patient denies recent history of bleeding such as epistaxis, hematuria or hematochezia. She is asymptomatic from the anemia. We will observe for now.  

## 2019-11-21 ENCOUNTER — Telehealth: Payer: Self-pay | Admitting: Hematology and Oncology

## 2019-11-21 NOTE — Telephone Encounter (Signed)
I talk with patient regarding schedule  

## 2019-12-12 ENCOUNTER — Ambulatory Visit
Admission: RE | Admit: 2019-12-12 | Discharge: 2019-12-12 | Disposition: A | Discharge status: Home / Self Care | Payer: 59 | Source: Ambulatory Visit | Attending: Physician Assistant | Admitting: Physician Assistant

## 2019-12-12 ENCOUNTER — Other Ambulatory Visit: Payer: Self-pay

## 2019-12-12 DIAGNOSIS — E2839 Other primary ovarian failure: Secondary | ICD-10-CM

## 2019-12-12 DIAGNOSIS — R29890 Loss of height: Secondary | ICD-10-CM

## 2020-01-04 ENCOUNTER — Other Ambulatory Visit: Payer: Self-pay | Admitting: Physician Assistant

## 2020-01-04 DIAGNOSIS — E041 Nontoxic single thyroid nodule: Secondary | ICD-10-CM

## 2020-01-12 ENCOUNTER — Ambulatory Visit
Admission: RE | Admit: 2020-01-12 | Discharge: 2020-01-12 | Disposition: A | Discharge status: Home / Self Care | Payer: 59 | Source: Ambulatory Visit | Attending: Physician Assistant | Admitting: Physician Assistant

## 2020-01-12 ENCOUNTER — Other Ambulatory Visit (HOSPITAL_COMMUNITY)
Admission: RE | Admit: 2020-01-12 | Discharge: 2020-01-12 | Disposition: A | Discharge status: Home / Self Care | Payer: 59 | Source: Ambulatory Visit | Attending: Radiology | Admitting: Radiology

## 2020-01-12 DIAGNOSIS — E041 Nontoxic single thyroid nodule: Secondary | ICD-10-CM | POA: Insufficient documentation

## 2020-01-16 LAB — CYTOLOGY - NON PAP

## 2020-02-16 ENCOUNTER — Telehealth: Payer: Self-pay | Admitting: Hematology and Oncology

## 2020-02-16 ENCOUNTER — Other Ambulatory Visit: Payer: Self-pay

## 2020-02-16 ENCOUNTER — Telehealth: Payer: Self-pay

## 2020-02-16 ENCOUNTER — Encounter: Payer: Self-pay | Admitting: Hematology and Oncology

## 2020-02-16 ENCOUNTER — Inpatient Hospital Stay: Payer: 59

## 2020-02-16 ENCOUNTER — Inpatient Hospital Stay: Payer: 59 | Attending: Hematology and Oncology | Admitting: Hematology and Oncology

## 2020-02-16 DIAGNOSIS — C8307 Small cell B-cell lymphoma, spleen: Secondary | ICD-10-CM | POA: Diagnosis not present

## 2020-02-16 DIAGNOSIS — D630 Anemia in neoplastic disease: Secondary | ICD-10-CM

## 2020-02-16 DIAGNOSIS — D649 Anemia, unspecified: Secondary | ICD-10-CM | POA: Insufficient documentation

## 2020-02-16 DIAGNOSIS — Z8572 Personal history of non-Hodgkin lymphomas: Secondary | ICD-10-CM | POA: Insufficient documentation

## 2020-02-16 DIAGNOSIS — E1165 Type 2 diabetes mellitus with hyperglycemia: Secondary | ICD-10-CM | POA: Diagnosis not present

## 2020-02-16 LAB — CBC WITH DIFFERENTIAL/PLATELET
Abs Immature Granulocytes: 0.26 10*3/uL — ABNORMAL HIGH (ref 0.00–0.07)
Basophils Absolute: 0.1 10*3/uL (ref 0.0–0.1)
Basophils Relative: 0 %
Eosinophils Absolute: 0.2 10*3/uL (ref 0.0–0.5)
Eosinophils Relative: 0 %
HCT: 35.9 % — ABNORMAL LOW (ref 36.0–46.0)
Hemoglobin: 10.8 g/dL — ABNORMAL LOW (ref 12.0–15.0)
Immature Granulocytes: 1 %
Lymphocytes Relative: 84 %
Lymphs Abs: 33.5 10*3/uL — ABNORMAL HIGH (ref 0.7–4.0)
MCH: 22.5 pg — ABNORMAL LOW (ref 26.0–34.0)
MCHC: 30.1 g/dL (ref 30.0–36.0)
MCV: 74.8 fL — ABNORMAL LOW (ref 80.0–100.0)
Monocytes Absolute: 1.3 10*3/uL — ABNORMAL HIGH (ref 0.1–1.0)
Monocytes Relative: 3 %
Neutro Abs: 4.7 10*3/uL (ref 1.7–7.7)
Neutrophils Relative %: 12 %
Platelets: 176 10*3/uL (ref 150–400)
RBC: 4.8 MIL/uL (ref 3.87–5.11)
RDW: 15.9 % — ABNORMAL HIGH (ref 11.5–15.5)
WBC: 40.1 10*3/uL — ABNORMAL HIGH (ref 4.0–10.5)
nRBC: 0 % (ref 0.0–0.2)

## 2020-02-16 LAB — COMPREHENSIVE METABOLIC PANEL
ALT: 21 U/L (ref 0–44)
AST: 21 U/L (ref 15–41)
Albumin: 3.7 g/dL (ref 3.5–5.0)
Alkaline Phosphatase: 104 U/L (ref 38–126)
Anion gap: 12 (ref 5–15)
BUN: 13 mg/dL (ref 6–20)
CO2: 26 mmol/L (ref 22–32)
Calcium: 9.1 mg/dL (ref 8.9–10.3)
Chloride: 102 mmol/L (ref 98–111)
Creatinine, Ser: 0.88 mg/dL (ref 0.44–1.00)
GFR calc Af Amer: >60 mL/min (ref >60)
GFR calc non Af Amer: >60 mL/min (ref >60)
Glucose, Bld: 260 mg/dL — ABNORMAL HIGH (ref 70–99)
Potassium: 4.1 mmol/L (ref 3.5–5.1)
Sodium: 140 mmol/L (ref 135–145)
Total Bilirubin: 0.7 mg/dL (ref 0.3–1.2)
Total Protein: 6.9 g/dL (ref 6.5–8.1)

## 2020-02-16 LAB — LACTATE DEHYDROGENASE: LDH: 323 U/L — ABNORMAL HIGH (ref 98–192)

## 2020-02-16 NOTE — Telephone Encounter (Signed)
Unable to reach pt. Left voicemail- scheduled appt on 8/6. Per 5/6 sch msg.

## 2020-02-16 NOTE — Assessment & Plan Note (Signed)
This is likely anemia of chronic disease. The patient denies recent history of bleeding such as epistaxis, hematuria or hematochezia. She is asymptomatic from the anemia. We will observe for now.  

## 2020-02-16 NOTE — Telephone Encounter (Signed)
-----   Message from Heath Lark, MD sent at 02/16/2020  9:47 AM EDT ----- Regarding: pls let her know blood sugar is 260

## 2020-02-16 NOTE — Progress Notes (Signed)
[facility] OFFICE PROGRESS NOTE  Patient Care Team: Lennie Odor, [place] as PCP - General (Nurse Practitioner) Heath Lark, MD as Consulting Physician (Hematology and Oncology)  ASSESSMENT & PLAN:  Splenic marginal zone b-cell lymphoma ([place]) She has rising white blood cell count but remained asymptomatic There is no doubt, she will likely have obvious disease in the near future requiring treatment I am very concerned about her poorly controlled diabetes that will impact on treatment decisions in the future Her uncontrolled diabetes also make PET CT scan impossible.  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 The patient denies recent history of bleeding such as epistaxis, hematuria or hematochezia. She is asymptomatic from the anemia. We will observe for now.  Diabetes mellitus type 2, uncontrolled ([place]) She continues to have poorly controlled diabetes When I question her diet, the patient has been eating potato chips, applesauce and pineapples which are all high in sugar She had been to weight loss center in the past but have difficulty sticking to her diet Her primary care doctor has recently started her on additional medicine to help control her diabetes I have a very long and frank discussion with the patient I told her that she would likely suffering from major consequences due to uncontrolled diabetes She will also be limiting treatment options because most aggressive lymphoma treatment would require prescription of high-dose steroids which she will not likely be able to tolerate given her current uncontrolled  diabetes She appears motivated today and will start with more aggressive dietary approach and will try to eliminate or starches and sugary food and   No orders of the defined types were placed in this encounter.   All questions were answered. The patient knows to call the clinic with any problems, questions or concerns. The total time spent in the appointment was 20 minutes encounter with patients including review of chart and various tests results, discussions about plan of care and coordination of care plan   Heath Lark, MD 02/16/2020 10:34 AM  INTERVAL HISTORY: Please see below for problem oriented charting. She returns for lymphoma follow-up Since last time I saw her, she continues to have uncontrolled diabetes According to the patient, her fasting blood sugar typically runs around 300 Her recent hemoglobin A1c was around 10% When I questioned about her diet, she has been indulging in potato chips, apples and pineapples She has eliminated soda from her diet She denies recent infection, fever or chills No new lymphadenopathy No unexplained weight loss.  In fact, she has gained some weight  SUMMARY OF ONCOLOGIC HISTORY: Oncology History  Splenic marginal zone b-cell lymphoma ([place])  01/11/2009 Initial Diagnosis   Splenic marginal zone b-cell lymphoma   06/06/2014 Bone Marrow Biopsy   Bone marrow aspirate and biopsy confirmed extensive involvement by CD20 positive non-Hodgkin's lymphoma.   06/07/2014 Imaging   She had a PET scan which showed predominant splenic involvement.   06/16/2014 - 07/07/2014 Chemotherapy   She started on weekly rituximab.   06/16/2014 Adverse Reaction   She had infusion reaction, resolved with IV dexamethasone.   08/18/2014 Imaging   PET CT scan show  resolution of hypermetabolic activity.   04/08/2016 Imaging   1. No evidence for aortic dissection or vascular injury. 2. Mild atherosclerotic changes within the abdominal aorta and branch vessels without  aneurysm. 3. No significant adenopathy or evidence for recurrent lymphoma. 4. Stable mild splenomegaly. 5. 5 mm nodule in the right lower lobe demonstrates interval increase in size. No follow-up needed if patient is low-risk. Non-contrast chest CT can be considered in 12 months if patient is high-risk.  6. Degenerate changes at the SI joints bilaterally.   01/21/2018 Imaging   1. Persistent mild splenomegaly, similar to prior examinations. No other findings to suggest recurrent disease in the chest, abdomen or pelvis. 2. Aortic atherosclerosis, in addition to two vessel coronary artery disease. Please note that although the presence of coronary artery calcium documents the presence of coronary artery disease, the severity of this disease and any potential stenosis cannot be assessed on this non-gated CT examination. Assessment for potential risk factor modification, dietary therapy or pharmacologic therapy may be warranted, if clinically indicated.  3. Enlarging heterogeneously enhancing and partially calcified left thyroid lobe nodule. Further evaluation with nonemergent thyroid ultrasound is recommended in the near future to better evaluate this lesion and determine if there is a need for fine-needle aspiration. 4. Additional incidental findings, as above.     REVIEW OF SYSTEMS:   Constitutional: Denies fevers, chills or abnormal weight loss Eyes: Denies blurriness of vision Ears, nose, mouth, throat, and face: Denies mucositis or sore throat Respiratory: Denies cough, dyspnea or wheezes Cardiovascular: Denies palpitation, chest discomfort or lower extremity swelling Gastrointestinal:  Denies nausea, heartburn or change in bowel habits Skin: Denies abnormal skin rashes Lymphatics: Denies new lymphadenopathy or easy bruising Neurological:Denies numbness, tingling or new weaknesses Behavioral/Psych: Mood is stable, no new changes  All other systems were reviewed with the patient and are  negative.  I have reviewed the past medical history, past surgical history, social history and family history with the patient and they are unchanged from previous note.  ALLERGIES:  is allergic to erythromycin; lovastatin; and tolnaftate.  MEDICATIONS:  Current Outpatient Medications  Medication Sig Dispense Refill  . acetaminophen (TYLENOL) 500 MG tablet Take 1 tablet (500 mg total) by mouth every 8 (eight) hours as needed (pain). 30 tablet 0  . aspirin 81 MG chewable tablet Chew 1 tablet (81 mg total) by mouth daily. 30 tablet 0  . Bempedoic Acid (NEXLETOL) 180 MG TABS Take 1 tablet by mouth daily.    . fish oil-omega-3 fatty acids 1000 MG capsule Take 1 g by mouth 2 (two) times daily.     . furosemide (LASIX) 40 MG tablet Take 40 mg by mouth as needed.    Marland Kitchen losartan (COZAAR) 25 MG tablet Take 25 mg by mouth daily.    . metFORMIN (GLUCOPHAGE) 1000 MG tablet Take 1,000 mg by mouth 2 (two) times daily with a meal.      . metoprolol tartrate (LOPRESSOR) 100 MG tablet Take 1 tablet (100 mg total) by mouth 2 (two) times daily. PT OVERDUE FOR OV PLEASE CALL FOR APPT 60 tablet 0  . nitroGLYCERIN (NITROSTAT) 0.4 MG SL tablet Place 1 tablet (0.4 mg total) under the tongue every 5 (five) minutes as needed for chest pain. 30 tablet 0  . valACYclovir (VALTREX) 500 MG tablet Take 500 mg by mouth 2 (two) times daily as needed. Takes for outbreaks only.     No current facility-administered medications for this visit.    PHYSICAL EXAMINATION: ECOG  PERFORMANCE STATUS: 1 - Symptomatic but completely ambulatory  Vitals:   02/16/20 0910  BP: 112/81  Pulse: (!) 107  Resp: 18  Temp: 97.8 F (36.6 C)  SpO2: 100%   Filed Weights   02/16/20 0910  Weight: 262 lb 12.8 oz (119.2 kg)    GENERAL:alert, no distress and comfortable SKIN: skin color, texture, turgor are normal, no rashes or significant lesions EYES: normal, Conjunctiva are pink and non-injected, sclera clear OROPHARYNX:no exudate, no  erythema and lips, buccal mucosa, and tongue normal  NECK: supple, thyroid normal size, non-tender, without nodularity LYMPH:  no palpable lymphadenopathy in the cervical, axillary or inguinal LUNGS: clear to auscultation and percussion with normal breathing effort HEART: regular rate & rhythm and no murmurs and no lower extremity edema ABDOMEN:abdomen soft, non-tender and normal bowel sounds Musculoskeletal:no cyanosis of digits and no clubbing  NEURO: alert & oriented x 3 with fluent speech, no focal motor/sensory deficits  LABORATORY DATA:  I have reviewed the data as listed    Component Value Date/Time   NA 140 02/16/2020 0844   NA 138 07/24/2017 1105   K 4.1 02/16/2020 0844   K 4.2 07/24/2017 1105   CL 102 02/16/2020 0844   CL 106 11/19/2012 0837   CO2 26 02/16/2020 0844   CO2 26 07/24/2017 1105   GLUCOSE 260 (H) 02/16/2020 0844   GLUCOSE 362 (H) 07/24/2017 1105   GLUCOSE 107 (H) 11/19/2012 0837   BUN 13 02/16/2020 0844   BUN 10.0 07/24/2017 1105   CREATININE 0.88 02/16/2020 0844   CREATININE 0.91 10/22/2018 0809   CREATININE 0.8 07/24/2017 1105   CALCIUM 9.1 02/16/2020 0844   CALCIUM 8.8 07/24/2017 1105   PROT 6.9 02/16/2020 0844   PROT 6.3 (L) 07/24/2017 1105   ALBUMIN 3.7 02/16/2020 0844   ALBUMIN 3.4 (L) 07/24/2017 1105   AST 21 02/16/2020 0844   AST 11 (L) 10/22/2018 0809   AST 13 07/24/2017 1105   ALT 21 02/16/2020 0844   ALT 16 10/22/2018 0809   ALT 21 07/24/2017 1105   ALKPHOS 104 02/16/2020 0844   ALKPHOS 112 07/24/2017 1105   BILITOT 0.7 02/16/2020 0844   BILITOT 0.7 10/22/2018 0809   BILITOT 0.55 07/24/2017 1105   GFRNONAA >60 02/16/2020 0844   GFRNONAA >60 10/22/2018 0809   GFRAA >60 02/16/2020 0844   GFRAA >60 10/22/2018 0809    No results found for: SPEP, UPEP  Lab Results  Component Value Date   WBC 40.1 (H) 02/16/2020   NEUTROABS 4.7 02/16/2020   HGB 10.8 (L) 02/16/2020   HCT 35.9 (L) 02/16/2020   MCV 74.8 (L) 02/16/2020   PLT 176  02/16/2020      Chemistry      Component Value Date/Time   NA 140 02/16/2020 0844   NA 138 07/24/2017 1105   K 4.1 02/16/2020 0844   K 4.2 07/24/2017 1105   CL 102 02/16/2020 0844   CL 106 11/19/2012 0837   CO2 26 02/16/2020 0844   CO2 26 07/24/2017 1105   BUN 13 02/16/2020 0844   BUN 10.0 07/24/2017 1105   CREATININE 0.88 02/16/2020 0844   CREATININE 0.91 10/22/2018 0809   CREATININE 0.8 07/24/2017 1105      Component Value Date/Time   CALCIUM 9.1 02/16/2020 0844   CALCIUM 8.8 07/24/2017 1105   ALKPHOS 104 02/16/2020 0844   ALKPHOS 112 07/24/2017 1105   AST 21 02/16/2020 0844   AST 11 (L) 10/22/2018 0809   AST 13 07/24/2017 1105  ALT 21 02/16/2020 0844   ALT 16 10/22/2018 0809   ALT 21 07/24/2017 1105   BILITOT 0.7 02/16/2020 0844   BILITOT 0.7 10/22/2018 0809   BILITOT 0.55 07/24/2017 1105

## 2020-02-16 NOTE — Assessment & Plan Note (Signed)
She continues to have poorly controlled diabetes When I question her diet, the patient has been eating potato chips, applesauce and pineapples which are all high in sugar She had been to weight loss center in the past but have difficulty sticking to her diet Her primary care doctor has recently started her on additional medicine to help control her diabetes I have a very long and frank discussion with the patient I told her that she would likely suffering from major consequences due to uncontrolled diabetes She will also be limiting treatment options because most aggressive lymphoma treatment would require prescription of high-dose steroids which she will not likely be able to tolerate given her current uncontrolled diabetes She appears motivated today and will start with more aggressive dietary approach and will try to eliminate or starches and sugary food and

## 2020-02-16 NOTE — Assessment & Plan Note (Signed)
She has rising white blood cell count but remained asymptomatic There is no doubt, she will likely have obvious disease in the near future requiring treatment I am very concerned about her poorly controlled diabetes that will impact on treatment decisions in the future Her uncontrolled diabetes also make PET CT scan impossible.  CT scan with contrast can hurt her kidneys in the setting of uncontrolled diabetes I spent some time educating the patient the importance of getting her diabetes under control I plan to see her again in 3 months for further follow-up She is educated to watch for signs and symptoms of disease such as new lymphadenopathy or infection

## 2020-02-16 NOTE — Telephone Encounter (Signed)
Called and left below message. Ask her to call the office if needed. 

## 2020-05-18 ENCOUNTER — Telehealth: Payer: Self-pay | Admitting: Hematology and Oncology

## 2020-05-18 ENCOUNTER — Encounter: Payer: Self-pay | Admitting: *Deleted

## 2020-05-18 ENCOUNTER — Encounter: Payer: Self-pay | Admitting: Hematology and Oncology

## 2020-05-18 ENCOUNTER — Other Ambulatory Visit: Payer: Self-pay

## 2020-05-18 ENCOUNTER — Inpatient Hospital Stay
Payer: No Typology Code available for payment source | Attending: Hematology and Oncology | Admitting: Hematology and Oncology

## 2020-05-18 ENCOUNTER — Inpatient Hospital Stay: Payer: No Typology Code available for payment source

## 2020-05-18 DIAGNOSIS — E1165 Type 2 diabetes mellitus with hyperglycemia: Secondary | ICD-10-CM | POA: Diagnosis not present

## 2020-05-18 DIAGNOSIS — D630 Anemia in neoplastic disease: Secondary | ICD-10-CM | POA: Diagnosis not present

## 2020-05-18 DIAGNOSIS — C8307 Small cell B-cell lymphoma, spleen: Secondary | ICD-10-CM | POA: Insufficient documentation

## 2020-05-18 DIAGNOSIS — K760 Fatty (change of) liver, not elsewhere classified: Secondary | ICD-10-CM

## 2020-05-18 LAB — COMPREHENSIVE METABOLIC PANEL
ALT: 13 U/L (ref 0–44)
AST: 14 U/L — ABNORMAL LOW (ref 15–41)
Albumin: 3.8 g/dL (ref 3.5–5.0)
Alkaline Phosphatase: 143 U/L — ABNORMAL HIGH (ref 38–126)
Anion gap: 11 (ref 5–15)
BUN: 16 mg/dL (ref 6–20)
CO2: 26 mmol/L (ref 22–32)
Calcium: 9.9 mg/dL (ref 8.9–10.3)
Chloride: 100 mmol/L (ref 98–111)
Creatinine, Ser: 1.04 mg/dL — ABNORMAL HIGH (ref 0.44–1.00)
GFR calc Af Amer: >60 mL/min (ref >60)
GFR calc non Af Amer: >60 mL/min (ref >60)
Glucose, Bld: 392 mg/dL — ABNORMAL HIGH (ref 70–99)
Potassium: 3.8 mmol/L (ref 3.5–5.1)
Sodium: 137 mmol/L (ref 135–145)
Total Bilirubin: 0.6 mg/dL (ref 0.3–1.2)
Total Protein: 7.1 g/dL (ref 6.5–8.1)

## 2020-05-18 LAB — CBC WITH DIFFERENTIAL/PLATELET
Abs Immature Granulocytes: 0.43 10*3/uL — ABNORMAL HIGH (ref 0.00–0.07)
Basophils Absolute: 0.2 10*3/uL — ABNORMAL HIGH (ref 0.0–0.1)
Basophils Relative: 0 %
Eosinophils Absolute: 0.2 10*3/uL (ref 0.0–0.5)
Eosinophils Relative: 0 %
HCT: 36.9 % (ref 36.0–46.0)
Hemoglobin: 11 g/dL — ABNORMAL LOW (ref 12.0–15.0)
Immature Granulocytes: 1 %
Lymphocytes Relative: 86 %
Lymphs Abs: 45.9 10*3/uL — ABNORMAL HIGH (ref 0.7–4.0)
MCH: 22.4 pg — ABNORMAL LOW (ref 26.0–34.0)
MCHC: 29.8 g/dL — ABNORMAL LOW (ref 30.0–36.0)
MCV: 75.3 fL — ABNORMAL LOW (ref 80.0–100.0)
Monocytes Absolute: 1.6 10*3/uL — ABNORMAL HIGH (ref 0.1–1.0)
Monocytes Relative: 3 %
Neutro Abs: 5.5 10*3/uL (ref 1.7–7.7)
Neutrophils Relative %: 10 %
Platelets: 182 10*3/uL (ref 150–400)
RBC: 4.9 MIL/uL (ref 3.87–5.11)
RDW: 17.2 % — ABNORMAL HIGH (ref 11.5–15.5)
WBC: 53.8 10*3/uL (ref 4.0–10.5)
nRBC: 0.1 % (ref 0.0–0.2)

## 2020-05-18 LAB — LACTATE DEHYDROGENASE: LDH: 323 U/L — ABNORMAL HIGH (ref 98–192)

## 2020-05-18 NOTE — Assessment & Plan Note (Signed)
She has rising white blood cell count but remained asymptomatic There is no doubt, she will likely have obvious disease in the near future requiring treatment I am very concerned about her poorly controlled diabetes that will impact on treatment decisions in the future Her uncontrolled diabetes also make PET CT scan impossible.  CT scan with contrast can hurt her kidneys in the setting of uncontrolled diabetes I spent some time educating the patient the importance of getting her diabetes under control Despite significant education and counseling before, she was not able to get her blood sugar under control I recommend referral to medical weight management center I plan to see her back in 2 months for further follow-up instead of 3, to watch out for signs of disease progression

## 2020-05-18 NOTE — Assessment & Plan Note (Signed)
She continues to have uncontrolled hyperglycemia This morning's blood sugar was actually a fasting blood sugar She admits she ate 2 cookies at midnight At this point in time, it is not clear to me that she can get her diabetes under control without additional help I recommend referral to medical weight management center and she is in agreement

## 2020-05-18 NOTE — Telephone Encounter (Signed)
Scheduled appts per 8/6 sch msg. Pt confirmed appt date and time.

## 2020-05-18 NOTE — Assessment & Plan Note (Signed)
Her elevated alkaline phosphatase is likely due to class III obesity/fatty liver disease We discussed the importance of getting her weight under control due to future treatment that involve corticosteroid therapy She is in agreement to try her best with dietary modification and weight loss

## 2020-05-18 NOTE — Progress Notes (Signed)
[facility] OFFICE PROGRESS NOTE  Patient Care Team: Lennie Odor, [place] as PCP - General (Nurse Practitioner) Heath Lark, MD as Consulting Physician (Hematology and Oncology)  ASSESSMENT & PLAN:  Splenic marginal zone b-cell lymphoma ([place]) She has rising white blood cell count but remained asymptomatic There is no doubt, she will likely have obvious disease in the near future requiring treatment I am very concerned about her poorly controlled diabetes that will impact on treatment decisions in the future Her uncontrolled diabetes also make PET CT scan impossible.  CT scan with contrast can hurt her kidneys in the setting of uncontrolled diabetes I spent some time educating the patient the importance of getting her diabetes under control Despite significant education and counseling before, she was not able to get her blood sugar under control I recommend referral to medical weight management center I plan to see her back in 2 months for further follow-up instead of 3, to watch out for signs of disease progression   Anemia in neoplastic disease This is likely anemia of chronic disease. The patient denies recent history of bleeding such as epistaxis, hematuria or hematochezia. She is asymptomatic from the anemia. We will observe for now.  Diabetes mellitus type 2, uncontrolled ([place]) She continues to have uncontrolled hyperglycemia This morning's blood sugar was actually a fasting blood sugar She admits she ate 2 cookies at midnight At this point in time, it is not clear to me that she can get her diabetes under control without additional help I recommend referral to medical weight management center and she is in agreement  Fatty liver disease, nonalcoholic Her elevated alkaline phosphatase is likely due to class III obesity/fatty liver disease We discussed the importance of getting her weight under control due to future treatment that involve corticosteroid therapy She is  in agreement to try her best with dietary modification and weight loss   Orders Placed This Encounter  Procedures  . CBC with Differential/Platelet    Standing Status:   Standing    Number of Occurrences:   22    Standing Expiration Date:   05/18/2021  . Comprehensive metabolic panel    Standing Status:   Standing    Number of Occurrences:   33    Standing Expiration Date:   05/18/2021  . Lactate dehydrogenase    Standing Status:   Future    Standing Expiration Date:   05/18/2021  . Amb Ref to Medical Weight Management    Referral Priority:   Routine    Referral Type:   Consultation    Number of Visits Requested:   1    All questions were answered. The patient knows to call the clinic with any problems, questions or concerns. The total time spent in the appointment was 20 minutes encounter with patients including review of chart and various tests results, discussions about plan of care and coordination of care plan   Heath Lark, MD 05/18/2020 1:29 PM  INTERVAL HISTORY: Please see below for problem oriented charting. She returns for further follow-up on lymphoma history She is feeling well No recent infection, fever or chills No new lymphadenopathy She is not able to lose any weight She admits to poor dietary choices and midnight snacking  SUMMARY OF ONCOLOGIC HISTORY: Oncology History  Splenic marginal zone b-cell lymphoma ([place])  01/11/2009 Initial Diagnosis   Splenic marginal zone b-cell lymphoma   06/06/2014 Bone Marrow Biopsy   Bone marrow aspirate and biopsy confirmed extensive involvement by CD20  positive non-Hodgkin's lymphoma.   06/07/2014 Imaging   She had a PET scan which showed predominant splenic involvement.   06/16/2014 - 07/07/2014 Chemotherapy   She started on weekly rituximab.   06/16/2014 Adverse Reaction   She had infusion reaction, resolved with IV dexamethasone.   08/18/2014 Imaging   PET CT scan show resolution of hypermetabolic activity.   04/08/2016  Imaging   1. No evidence for aortic dissection or vascular injury. 2. Mild atherosclerotic changes within the abdominal aorta and branch vessels without aneurysm. 3. No significant adenopathy or evidence for recurrent lymphoma. 4. Stable mild splenomegaly. 5. 5 mm nodule in the right lower lobe demonstrates interval increase in size. No follow-up needed if patient is low-risk. Non-contrast chest CT can be considered in 12 months if patient is high-risk.  6. Degenerate changes at the SI joints bilaterally.   01/21/2018 Imaging   1. Persistent mild splenomegaly, similar to prior examinations. No other findings to suggest recurrent disease in the chest, abdomen or pelvis. 2. Aortic atherosclerosis, in addition to two vessel coronary artery disease. Please note that although the presence of coronary artery calcium documents the presence of coronary artery disease, the severity of this disease and any potential stenosis cannot be assessed on this non-gated CT examination. Assessment for potential risk factor modification, dietary therapy or pharmacologic therapy may be warranted, if clinically indicated.  3. Enlarging heterogeneously enhancing and partially calcified left thyroid lobe nodule. Further evaluation with nonemergent thyroid ultrasound is recommended in the near future to better evaluate this lesion and determine if there is a need for fine-needle aspiration. 4. Additional incidental findings, as above.     REVIEW OF SYSTEMS:   Constitutional: Denies fevers, chills or abnormal weight loss Eyes: Denies blurriness of vision Ears, nose, mouth, throat, and face: Denies mucositis or sore throat Respiratory: Denies cough, dyspnea or wheezes Cardiovascular: Denies palpitation, chest discomfort or lower extremity swelling Gastrointestinal:  Denies nausea, heartburn or change in bowel habits Skin: Denies abnormal skin rashes Lymphatics: Denies new lymphadenopathy or easy  bruising Neurological:Denies numbness, tingling or new weaknesses Behavioral/Psych: Mood is stable, no new changes  All other systems were reviewed with the patient and are negative.  I have reviewed the past medical history, past surgical history, social history and family history with the patient and they are unchanged from previous note.  ALLERGIES:  is allergic to erythromycin, lovastatin, and tolnaftate.  MEDICATIONS:  Current Outpatient Medications  Medication Sig Dispense Refill  . acetaminophen (TYLENOL) 500 MG tablet Take 1 tablet (500 mg total) by mouth every 8 (eight) hours as needed (pain). 30 tablet 0  . aspirin 81 MG chewable tablet Chew 1 tablet (81 mg total) by mouth daily. 30 tablet 0  . Bempedoic Acid (NEXLETOL) 180 MG TABS Take 1 tablet by mouth daily.    . fish oil-omega-3 fatty acids 1000 MG capsule Take 1 g by mouth 2 (two) times daily.     . furosemide (LASIX) 40 MG tablet Take 40 mg by mouth as needed.    Marland Kitchen losartan (COZAAR) 25 MG tablet Take 25 mg by mouth daily.    . metFORMIN (GLUCOPHAGE) 1000 MG tablet Take 1,000 mg by mouth 2 (two) times daily with a meal.      . metoprolol tartrate (LOPRESSOR) 100 MG tablet Take 1 tablet (100 mg total) by mouth 2 (two) times daily. PT OVERDUE FOR OV PLEASE CALL FOR APPT 60 tablet 0  . nitroGLYCERIN (NITROSTAT) 0.4 MG SL tablet Place 1 tablet (  0.4 mg total) under the tongue every 5 (five) minutes as needed for chest pain. 30 tablet 0  . valACYclovir (VALTREX) 500 MG tablet Take 500 mg by mouth 2 (two) times daily as needed. Takes for outbreaks only.     No current facility-administered medications for this visit.    PHYSICAL EXAMINATION: ECOG PERFORMANCE STATUS: 1 - Symptomatic but completely ambulatory  Vitals:   05/18/20 0902  BP: 109/79  Pulse: (!) 102  Resp: 18  Temp: 98 F (36.7 C)  SpO2: 100%   Filed Weights   05/18/20 0902  Weight: 266 lb 6.4 oz (120.8 kg)    GENERAL:alert, no distress and  comfortable SKIN: skin color, texture, turgor are normal, no rashes or significant lesions EYES: normal, Conjunctiva are pink and non-injected, sclera clear OROPHARYNX:no exudate, no erythema and lips, buccal mucosa, and tongue normal  NECK: supple, thyroid normal size, non-tender, without nodularity LYMPH:  no palpable lymphadenopathy in the cervical, axillary or inguinal LUNGS: clear to auscultation and percussion with normal breathing effort HEART: regular rate & rhythm and no murmurs and no lower extremity edema ABDOMEN:abdomen soft, non-tender and normal bowel sounds Musculoskeletal:no cyanosis of digits and no clubbing  NEURO: alert & oriented x 3 with fluent speech, no focal motor/sensory deficits  LABORATORY DATA:  I have reviewed the data as listed    Component Value Date/Time   NA 137 05/18/2020 0825   NA 138 07/24/2017 1105   K 3.8 05/18/2020 0825   K 4.2 07/24/2017 1105   CL 100 05/18/2020 0825   CL 106 11/19/2012 0837   CO2 26 05/18/2020 0825   CO2 26 07/24/2017 1105   GLUCOSE 392 (H) 05/18/2020 0825   GLUCOSE 362 (H) 07/24/2017 1105   GLUCOSE 107 (H) 11/19/2012 0837   BUN 16 05/18/2020 0825   BUN 10.0 07/24/2017 1105   CREATININE 1.04 (H) 05/18/2020 0825   CREATININE 0.91 10/22/2018 0809   CREATININE 0.8 07/24/2017 1105   CALCIUM 9.9 05/18/2020 0825   CALCIUM 8.8 07/24/2017 1105   PROT 7.1 05/18/2020 0825   PROT 6.3 (L) 07/24/2017 1105   ALBUMIN 3.8 05/18/2020 0825   ALBUMIN 3.4 (L) 07/24/2017 1105   AST 14 (L) 05/18/2020 0825   AST 11 (L) 10/22/2018 0809   AST 13 07/24/2017 1105   ALT 13 05/18/2020 0825   ALT 16 10/22/2018 0809   ALT 21 07/24/2017 1105   ALKPHOS 143 (H) 05/18/2020 0825   ALKPHOS 112 07/24/2017 1105   BILITOT 0.6 05/18/2020 0825   BILITOT 0.7 10/22/2018 0809   BILITOT 0.55 07/24/2017 1105   GFRNONAA >60 05/18/2020 0825   GFRNONAA >60 10/22/2018 0809   GFRAA >60 05/18/2020 0825   GFRAA >60 10/22/2018 0809    No results found for:  SPEP, UPEP  Lab Results  Component Value Date   WBC 53.8 (HH) 05/18/2020   NEUTROABS 5.5 05/18/2020   HGB 11.0 (L) 05/18/2020   HCT 36.9 05/18/2020   MCV 75.3 (L) 05/18/2020   PLT 182 05/18/2020      Chemistry      Component Value Date/Time   NA 137 05/18/2020 0825   NA 138 07/24/2017 1105   K 3.8 05/18/2020 0825   K 4.2 07/24/2017 1105   CL 100 05/18/2020 0825   CL 106 11/19/2012 0837   CO2 26 05/18/2020 0825   CO2 26 07/24/2017 1105   BUN 16 05/18/2020 0825   BUN 10.0 07/24/2017 1105   CREATININE 1.04 (H) 05/18/2020 0825   CREATININE  0.91 10/22/2018 0809   CREATININE 0.8 07/24/2017 1105      Component Value Date/Time   CALCIUM 9.9 05/18/2020 0825   CALCIUM 8.8 07/24/2017 1105   ALKPHOS 143 (H) 05/18/2020 0825   ALKPHOS 112 07/24/2017 1105   AST 14 (L) 05/18/2020 0825   AST 11 (L) 10/22/2018 0809   AST 13 07/24/2017 1105   ALT 13 05/18/2020 0825   ALT 16 10/22/2018 0809   ALT 21 07/24/2017 1105   BILITOT 0.6 05/18/2020 0825   BILITOT 0.7 10/22/2018 0809   BILITOT 0.55 07/24/2017 1105

## 2020-05-18 NOTE — Progress Notes (Unsigned)
CRITICAL VALUE STICKER  CRITICAL VALUE: WBC 53.8  RECEIVER (on-site recipient of call):Arsal Tappan, RN   DATE & TIME NOTIFIED: 05/21/20 at [facility] (representative from lab): Lelan Pons  MD NOTIFIED: Dr. Alvy Bimler via Cecille Rubin, RN  TIME OF NOTIFICATION:0848  RESPONSE: MD w/patient now

## 2020-05-18 NOTE — Assessment & Plan Note (Signed)
This is likely anemia of chronic disease. The patient denies recent history of bleeding such as epistaxis, hematuria or hematochezia. She is asymptomatic from the anemia. We will observe for now.  

## 2020-05-18 NOTE — Progress Notes (Signed)
Reported critical WBC 53.8 to Dr. Alvy Bimler.

## 2020-05-24 ENCOUNTER — Encounter (INDEPENDENT_AMBULATORY_CARE_PROVIDER_SITE_OTHER): Payer: Self-pay

## 2020-07-20 ENCOUNTER — Inpatient Hospital Stay (HOSPITAL_BASED_OUTPATIENT_CLINIC_OR_DEPARTMENT_OTHER): Payer: No Typology Code available for payment source | Admitting: Hematology and Oncology

## 2020-07-20 ENCOUNTER — Other Ambulatory Visit: Payer: Self-pay

## 2020-07-20 ENCOUNTER — Telehealth: Payer: Self-pay

## 2020-07-20 ENCOUNTER — Inpatient Hospital Stay: Payer: No Typology Code available for payment source | Attending: Hematology and Oncology

## 2020-07-20 ENCOUNTER — Encounter: Payer: Self-pay | Admitting: Hematology and Oncology

## 2020-07-20 DIAGNOSIS — D630 Anemia in neoplastic disease: Secondary | ICD-10-CM | POA: Diagnosis not present

## 2020-07-20 DIAGNOSIS — C8307 Small cell B-cell lymphoma, spleen: Secondary | ICD-10-CM

## 2020-07-20 DIAGNOSIS — E1165 Type 2 diabetes mellitus with hyperglycemia: Secondary | ICD-10-CM

## 2020-07-20 DIAGNOSIS — Z299 Encounter for prophylactic measures, unspecified: Secondary | ICD-10-CM | POA: Diagnosis not present

## 2020-07-20 DIAGNOSIS — K760 Fatty (change of) liver, not elsewhere classified: Secondary | ICD-10-CM

## 2020-07-20 LAB — CBC WITH DIFFERENTIAL/PLATELET
Abs Immature Granulocytes: 0.22 10*3/uL — ABNORMAL HIGH (ref 0.00–0.07)
Basophils Absolute: 0.2 10*3/uL — ABNORMAL HIGH (ref 0.0–0.1)
Basophils Relative: 0 %
Eosinophils Absolute: 0.1 10*3/uL (ref 0.0–0.5)
Eosinophils Relative: 0 %
HCT: 32.1 % — ABNORMAL LOW (ref 36.0–46.0)
Hemoglobin: 9.3 g/dL — ABNORMAL LOW (ref 12.0–15.0)
Immature Granulocytes: 1 %
Lymphocytes Relative: 87 %
Lymphs Abs: 41.5 10*3/uL — ABNORMAL HIGH (ref 0.7–4.0)
MCH: 21.5 pg — ABNORMAL LOW (ref 26.0–34.0)
MCHC: 29 g/dL — ABNORMAL LOW (ref 30.0–36.0)
MCV: 74.1 fL — ABNORMAL LOW (ref 80.0–100.0)
Monocytes Absolute: 0.9 10*3/uL (ref 0.1–1.0)
Monocytes Relative: 2 %
Neutro Abs: 4.5 10*3/uL (ref 1.7–7.7)
Neutrophils Relative %: 10 %
Platelets: 171 10*3/uL (ref 150–400)
RBC: 4.33 MIL/uL (ref 3.87–5.11)
RDW: 16.7 % — ABNORMAL HIGH (ref 11.5–15.5)
WBC: 47.3 10*3/uL — ABNORMAL HIGH (ref 4.0–10.5)
nRBC: 0 % (ref 0.0–0.2)

## 2020-07-20 LAB — COMPREHENSIVE METABOLIC PANEL
ALT: 18 U/L (ref 0–44)
AST: 18 U/L (ref 15–41)
Albumin: 3.7 g/dL (ref 3.5–5.0)
Alkaline Phosphatase: 91 U/L (ref 38–126)
Anion gap: 11 (ref 5–15)
BUN: 18 mg/dL (ref 6–20)
CO2: 30 mmol/L (ref 22–32)
Calcium: 9.6 mg/dL (ref 8.9–10.3)
Chloride: 100 mmol/L (ref 98–111)
Creatinine, Ser: 0.88 mg/dL (ref 0.44–1.00)
GFR calc non Af Amer: >60 mL/min (ref >60)
Glucose, Bld: 177 mg/dL — ABNORMAL HIGH (ref 70–99)
Potassium: 3.4 mmol/L — ABNORMAL LOW (ref 3.5–5.1)
Sodium: 141 mmol/L (ref 135–145)
Total Bilirubin: 0.8 mg/dL (ref 0.3–1.2)
Total Protein: 7 g/dL (ref 6.5–8.1)

## 2020-07-20 LAB — LACTATE DEHYDROGENASE: LDH: 326 U/L — ABNORMAL HIGH (ref 98–192)

## 2020-07-20 NOTE — Assessment & Plan Note (Signed)
Her blood sugar control is better She will continue her weight loss effort

## 2020-07-20 NOTE — Telephone Encounter (Signed)
-----   Message from Heath Lark, MD sent at 07/20/2020  8:46 AM EDT ----- Regarding: pls let her know blood sugar is better than before, all her kidney and liver tests are good

## 2020-07-20 NOTE — Assessment & Plan Note (Signed)
This is likely anemia of chronic disease. The patient denies recent history of bleeding such as epistaxis, hematuria or hematochezia. She is asymptomatic from the anemia. We will observe for now.  

## 2020-07-20 NOTE — Assessment & Plan Note (Signed)
I encouraged her to get Covid vaccination as soon as possible She is scheduled to receive her first dose in [place]

## 2020-07-20 NOTE — Progress Notes (Signed)
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 The patient denies recent history of bleeding such as epistaxis, hematuria or hematochezia. She is asymptomatic from the anemia. We will observe for now.  Preventive measure I encouraged her to get Covid vaccination as soon as possible She is scheduled to receive her first dose in [place]  Diabetes mellitus type 2, uncontrolled ([place]) Her blood sugar control is better She will continue her weight loss effort   No orders of the defined types were placed in this encounter.   All questions were answered. The patient knows to call the clinic with any problems, questions or concerns. The total time spent in the appointment was 20 minutes encounter with patients including review of chart and various tests results, discussions about plan of care and coordination of care plan   Heath Lark, MD 07/20/2020 8:46 AM  INTERVAL HISTORY: Please see below for problem oriented charting. She returns for lymphoma follow-up Since last time I saw her, she have lost approximately 16 pounds of weight through aggressive dietary modification with low carbohydrate diet She felt well No new lymphadenopathy Denies recent infection, fever or chills She has not received her Covid vaccination but has schedule her first injection locally  SUMMARY OF ONCOLOGIC  HISTORY: Oncology History  Splenic marginal zone b-cell lymphoma ([place])  01/11/2009 Initial Diagnosis   Splenic marginal zone b-cell lymphoma   06/06/2014 Bone Marrow Biopsy   Bone marrow aspirate and biopsy confirmed extensive involvement by CD20 positive non-Hodgkin's lymphoma.   06/07/2014 Imaging   She had a PET scan which showed predominant splenic involvement.   06/16/2014 - 07/07/2014 Chemotherapy   She started on weekly rituximab.   06/16/2014 Adverse Reaction   She had infusion reaction, resolved with IV dexamethasone.   08/18/2014 Imaging   PET CT scan show resolution of hypermetabolic activity.   04/08/2016 Imaging   1. No evidence for aortic dissection or vascular injury. 2. Mild atherosclerotic changes within the abdominal aorta and branch vessels without aneurysm. 3. No significant adenopathy or evidence for recurrent lymphoma. 4. Stable mild splenomegaly. 5. 5 mm nodule in the right lower lobe demonstrates interval increase in size. No follow-up needed if patient is low-risk. Non-contrast chest CT can be considered in 12 months if patient is high-risk.  6. Degenerate changes at the SI joints bilaterally.   01/21/2018 Imaging   1. Persistent mild splenomegaly, similar to prior examinations. No other findings to suggest recurrent disease in the chest, abdomen or pelvis. 2. Aortic atherosclerosis, in addition to two vessel coronary artery disease. Please note that although the presence of coronary artery calcium documents the presence of coronary artery disease, the severity of this disease and any potential stenosis cannot be assessed on this non-gated CT examination. Assessment for potential risk factor modification, dietary therapy or pharmacologic therapy may be warranted, if clinically indicated.  3. Enlarging heterogeneously enhancing and partially calcified left thyroid lobe  nodule. Further evaluation with nonemergent thyroid ultrasound is recommended in the near future to  better evaluate this lesion and determine if there is a need for fine-needle aspiration. 4. Additional incidental findings, as above.     REVIEW OF SYSTEMS:   Constitutional: Denies fevers, chills or abnormal weight loss Eyes: Denies blurriness of vision Ears, nose, mouth, throat, and face: Denies mucositis or sore throat Respiratory: Denies cough, dyspnea or wheezes Cardiovascular: Denies palpitation, chest discomfort or lower extremity swelling Gastrointestinal:  Denies nausea, heartburn or change in bowel habits Skin: Denies abnormal skin rashes Lymphatics: Denies new lymphadenopathy or easy bruising Neurological:Denies numbness, tingling or new weaknesses Behavioral/Psych: Mood is stable, no new changes  All other systems were reviewed with the patient and are negative.  I have reviewed the past medical history, past surgical history, social history and family history with the patient and they are unchanged from previous note.  ALLERGIES:  is allergic to erythromycin, lovastatin, and tolnaftate.  MEDICATIONS:  Current Outpatient Medications  Medication Sig Dispense Refill  . acetaminophen (TYLENOL) 500 MG tablet Take 1 tablet (500 mg total) by mouth every 8 (eight) hours as needed (pain). 30 tablet 0  . aspirin 81 MG chewable tablet Chew 1 tablet (81 mg total) by mouth daily. 30 tablet 0  . Bempedoic Acid (NEXLETOL) 180 MG TABS Take 1 tablet by mouth daily.    . fish oil-omega-3 fatty acids 1000 MG capsule Take 1 g by mouth 2 (two) times daily.     . furosemide (LASIX) 40 MG tablet Take 40 mg by mouth as needed.    Marland Kitchen losartan (COZAAR) 25 MG tablet Take 25 mg by mouth daily.    . metFORMIN (GLUCOPHAGE) 1000 MG tablet Take 1,000 mg by mouth 2 (two) times daily with a meal.      . metoprolol tartrate (LOPRESSOR) 100 MG tablet Take 1 tablet (100 mg total) by mouth 2 (two) times daily. PT OVERDUE FOR OV PLEASE CALL FOR APPT 60 tablet 0  . nitroGLYCERIN (NITROSTAT) 0.4 MG SL tablet  Place 1 tablet (0.4 mg total) under the tongue every 5 (five) minutes as needed for chest pain. 30 tablet 0  . valACYclovir (VALTREX) 500 MG tablet Take 500 mg by mouth 2 (two) times daily as needed. Takes for outbreaks only.     No current facility-administered medications for this visit.    PHYSICAL EXAMINATION: ECOG PERFORMANCE STATUS: 0 - Asymptomatic  Vitals:   07/20/20 0753  BP: 104/72  Pulse: (!) 105  Resp: 17  Temp: 98 F (36.7 C)  SpO2: 96%   Filed Weights   07/20/20 0753  Weight: 249 lb 8 oz (113.2 kg)    GENERAL:alert, no distress and comfortable SKIN: skin color, texture, turgor are normal, no rashes or significant lesions EYES: normal, Conjunctiva are pink and non-injected, sclera clear OROPHARYNX:no exudate, no erythema and lips, buccal mucosa, and tongue normal  NECK: supple, thyroid normal size, non-tender, without nodularity LYMPH:  no palpable lymphadenopathy in the cervical, axillary or inguinal LUNGS: clear to auscultation and percussion with normal breathing effort HEART: regular rate & rhythm and no murmurs and no lower extremity edema ABDOMEN:abdomen soft, non-tender and normal bowel sounds Musculoskeletal:no cyanosis of digits and no clubbing  NEURO: alert & oriented x 3 with fluent speech, no focal motor/sensory deficits  LABORATORY DATA:  I have reviewed the data as listed    Component Value Date/Time   NA 141 07/20/2020 0715   NA 138 07/24/2017 1105  K 3.4 (L) 07/20/2020 0715   K 4.2 07/24/2017 1105   CL 100 07/20/2020 0715   CL 106 11/19/2012 0837   CO2 30 07/20/2020 0715   CO2 26 07/24/2017 1105   GLUCOSE 177 (H) 07/20/2020 0715   GLUCOSE 362 (H) 07/24/2017 1105   GLUCOSE 107 (H) 11/19/2012 0837   BUN 18 07/20/2020 0715   BUN 10.0 07/24/2017 1105   CREATININE 0.88 07/20/2020 0715   CREATININE 0.91 10/22/2018 0809   CREATININE 0.8 07/24/2017 1105   CALCIUM 9.6 07/20/2020 0715   CALCIUM 8.8 07/24/2017 1105   PROT 7.0 07/20/2020 0715    PROT 6.3 (L) 07/24/2017 1105   ALBUMIN 3.7 07/20/2020 0715   ALBUMIN 3.4 (L) 07/24/2017 1105   AST 18 07/20/2020 0715   AST 11 (L) 10/22/2018 0809   AST 13 07/24/2017 1105   ALT 18 07/20/2020 0715   ALT 16 10/22/2018 0809   ALT 21 07/24/2017 1105   ALKPHOS 91 07/20/2020 0715   ALKPHOS 112 07/24/2017 1105   BILITOT 0.8 07/20/2020 0715   BILITOT 0.7 10/22/2018 0809   BILITOT 0.55 07/24/2017 1105   GFRNONAA >60 07/20/2020 0715   GFRNONAA >60 10/22/2018 0809   GFRAA >60 05/18/2020 0825   GFRAA >60 10/22/2018 0809    No results found for: SPEP, UPEP  Lab Results  Component Value Date   WBC 47.3 (H) 07/20/2020   NEUTROABS 4.5 07/20/2020   HGB 9.3 (L) 07/20/2020   HCT 32.1 (L) 07/20/2020   MCV 74.1 (L) 07/20/2020   PLT 171 07/20/2020      Chemistry      Component Value Date/Time   NA 141 07/20/2020 0715   NA 138 07/24/2017 1105   K 3.4 (L) 07/20/2020 0715   K 4.2 07/24/2017 1105   CL 100 07/20/2020 0715   CL 106 11/19/2012 0837   CO2 30 07/20/2020 0715   CO2 26 07/24/2017 1105   BUN 18 07/20/2020 0715   BUN 10.0 07/24/2017 1105   CREATININE 0.88 07/20/2020 0715   CREATININE 0.91 10/22/2018 0809   CREATININE 0.8 07/24/2017 1105      Component Value Date/Time   CALCIUM 9.6 07/20/2020 0715   CALCIUM 8.8 07/24/2017 1105   ALKPHOS 91 07/20/2020 0715   ALKPHOS 112 07/24/2017 1105   AST 18 07/20/2020 0715   AST 11 (L) 10/22/2018 0809   AST 13 07/24/2017 1105   ALT 18 07/20/2020 0715   ALT 16 10/22/2018 0809   ALT 21 07/24/2017 1105   BILITOT 0.8 07/20/2020 0715   BILITOT 0.7 10/22/2018 0809   BILITOT 0.55 07/24/2017 1105       RADIOGRAPHIC STUDIES: I have personally reviewed the radiological images as listed and agreed with the findings in the report. No results found.

## 2020-07-20 NOTE — Assessment & Plan Note (Signed)
She has rising white blood cell count but remained asymptomatic With recent weight loss, her lymphocyte count is actually better There is no doubt, she will likely have obvious disease in the near future requiring treatment I plan to see her back in 2 months for further follow-up to watch out for signs of disease progression

## 2020-07-20 NOTE — Telephone Encounter (Signed)
Called and given below message. She verbalized understanding. 

## 2020-07-23 ENCOUNTER — Telehealth: Payer: Self-pay | Admitting: Hematology and Oncology

## 2020-07-23 NOTE — Telephone Encounter (Signed)
Scheduled appt per 10/8 sch msg - pt is aware of appt date and time

## 2020-07-24 ENCOUNTER — Telehealth: Payer: Self-pay | Admitting: Hematology and Oncology

## 2020-07-24 NOTE — Telephone Encounter (Signed)
Error

## 2020-08-06 IMAGING — MG DIGITAL SCREENING BILAT W/ CAD
7 series · 7 of 7 positions shown · non-contrast
Comparison: Previous exam(s).

CLINICAL DATA: Screening.

EXAM:
DIGITAL SCREENING BILATERAL MAMMOGRAM WITH CAD

[L CC]
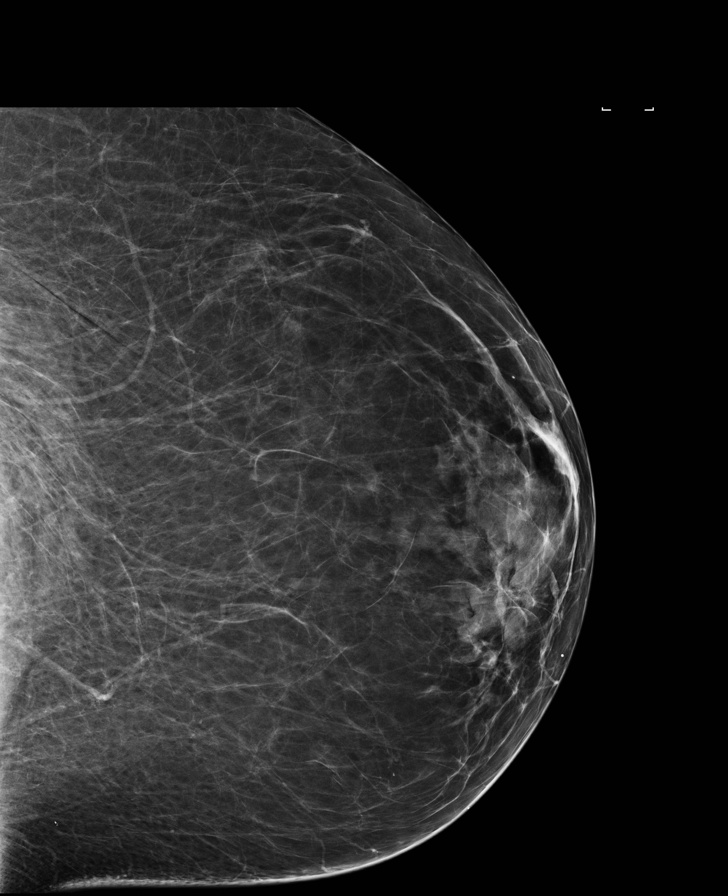

[L MLO (1 of 2)]
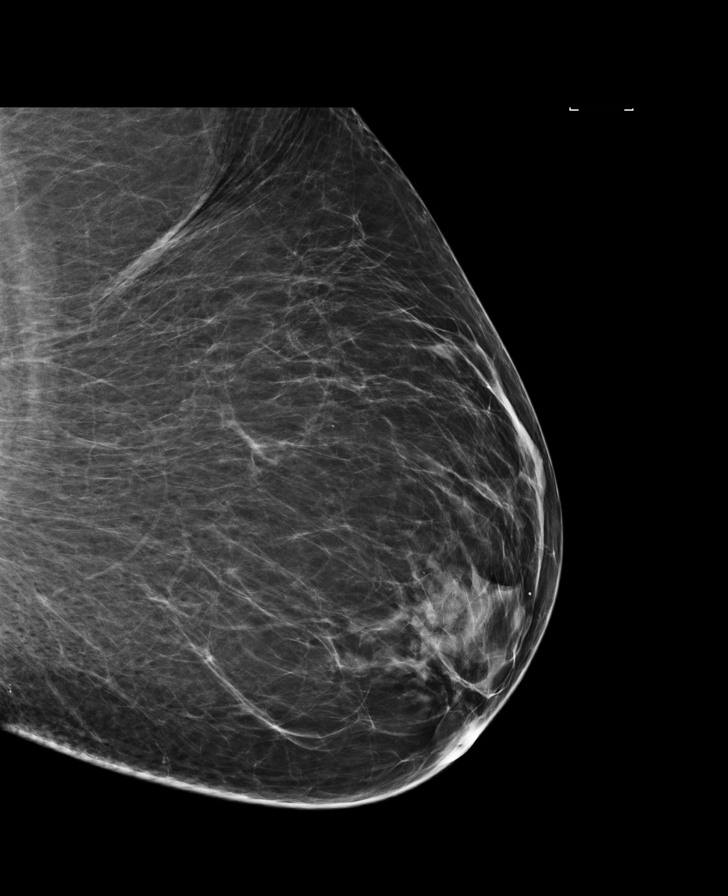

[R CC]
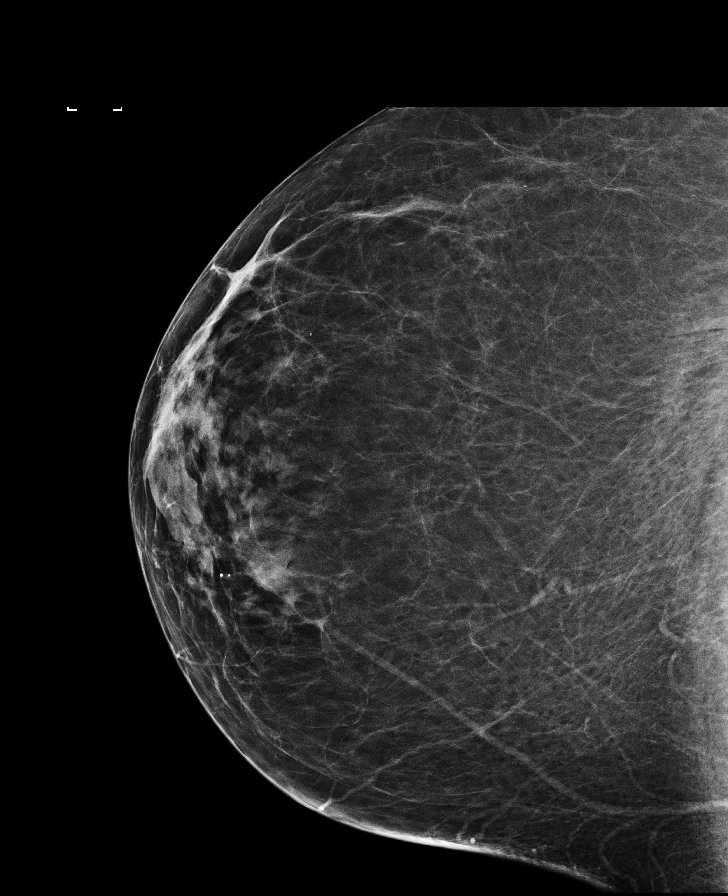

[L MLO (2 of 2)]
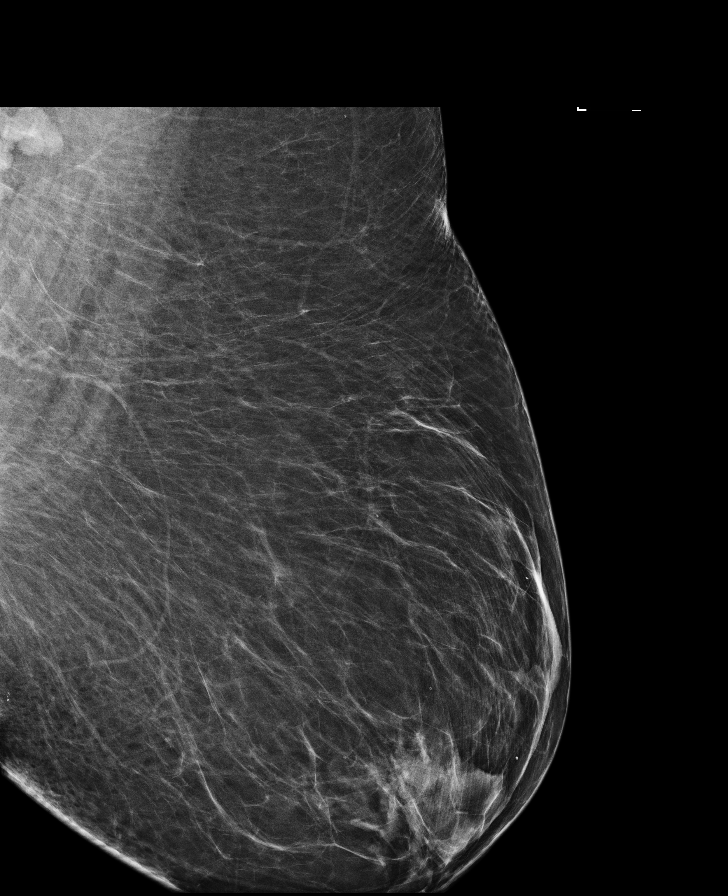

[R MLO (1 of 2)]
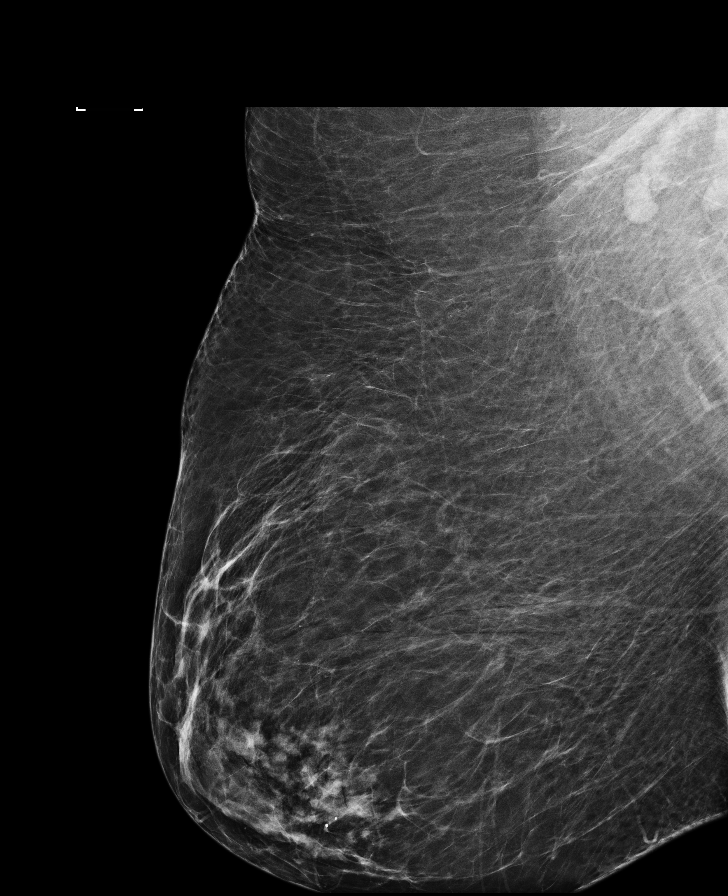

[R MLO (2 of 2)]
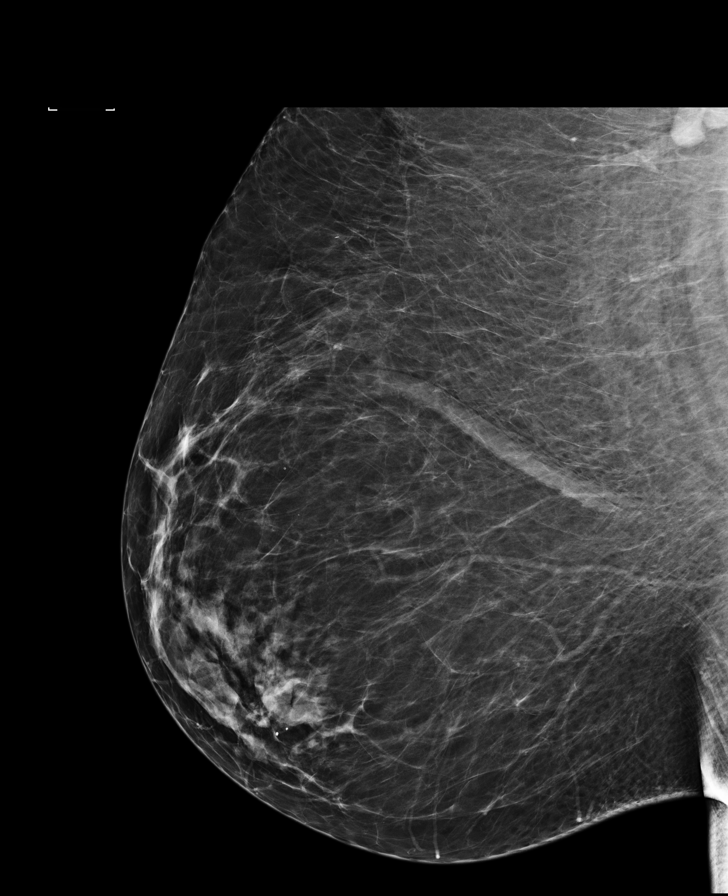

[L CV]
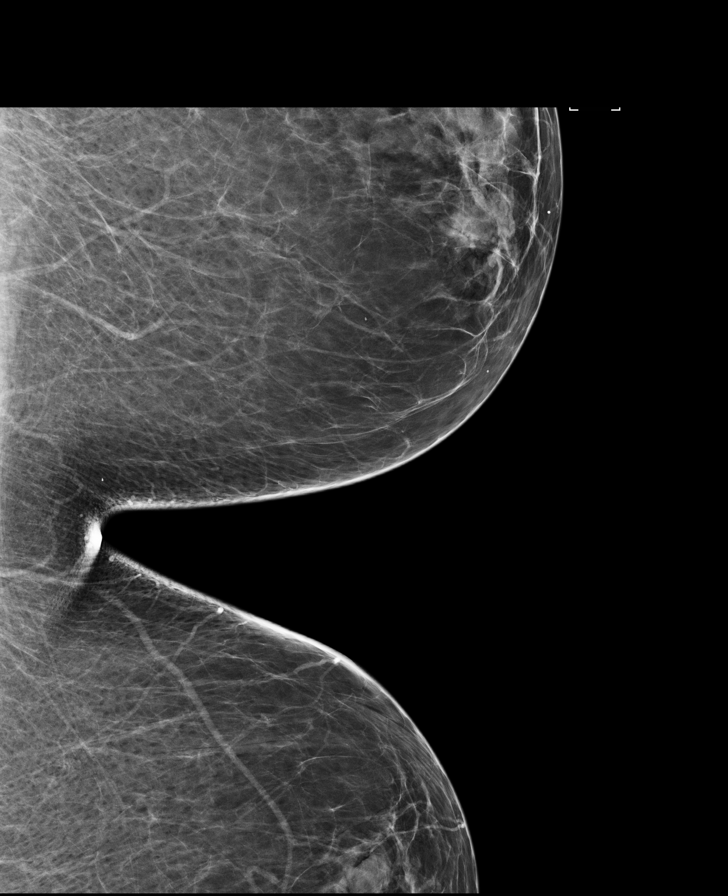

[7 of 7 positions shown; findings below may reference images not displayed]

ACR Breast Density Category b: There are scattered areas of
fibroglandular density.
FINDINGS: There are no findings suspicious for malignancy. Images were
processed with CAD.
IMPRESSION: No mammographic evidence of malignancy. A result letter of this
screening mammogram will be mailed directly to the patient.

RECOMMENDATION:
Screening mammogram in one year. (Code:AS-G-LCT)

BI-RADS CATEGORY  1: Negative.

## 2020-08-31 IMAGING — US US THYROID
1 series · 13 of 25 positions shown · non-contrast
Comparison: 03/18/2018

CLINICAL DATA: 53-year-old female with a history of thyroid nodules

EXAM:
THYROID ULTRASOUND
TECHNIQUE: Ultrasound examination of the thyroid gland and adjacent soft
tissues was performed.

[Series 1: us thyroid · 0.07mm/px · 13 of 68 slices shown]
[im 1/68]
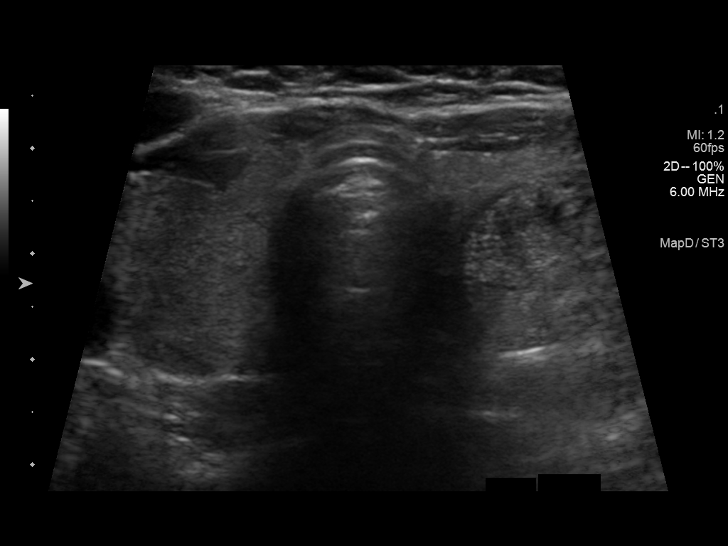
[im 6/68]
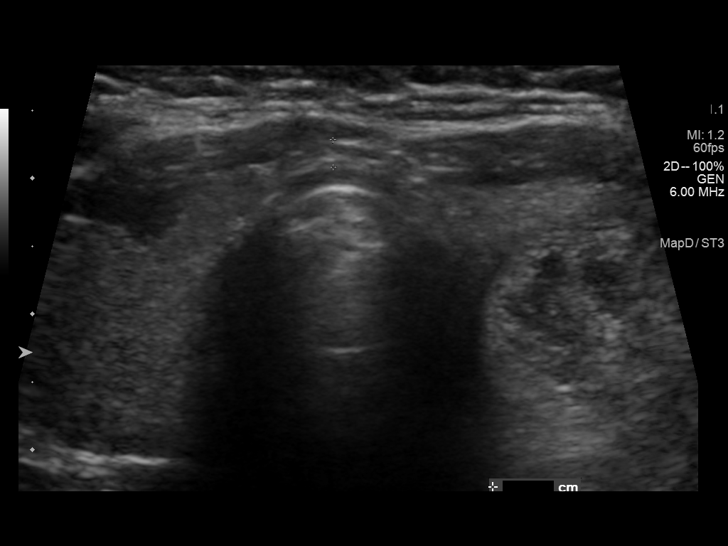
[im 12/68]
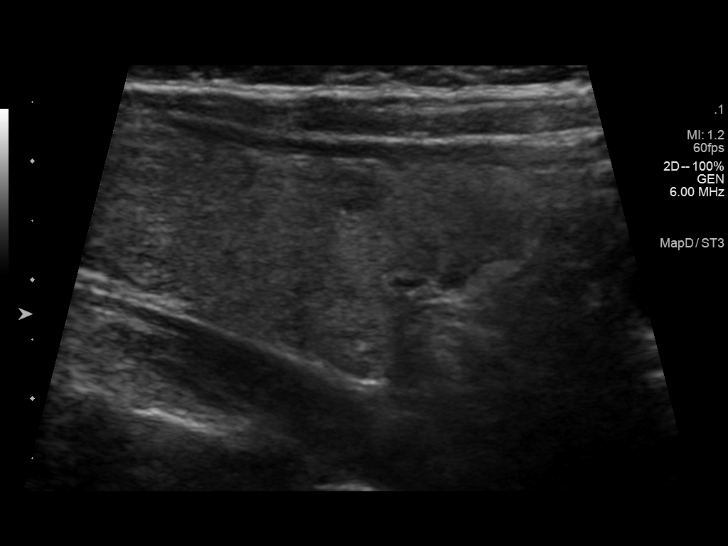
[im 17/68]
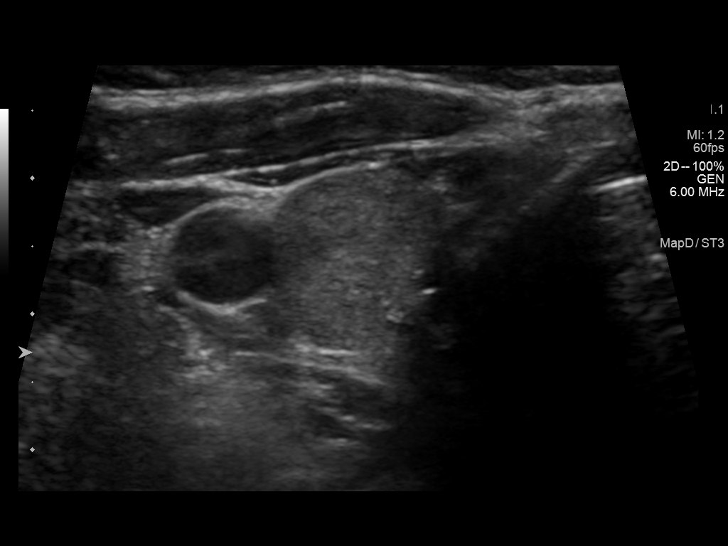
[im 23/68]
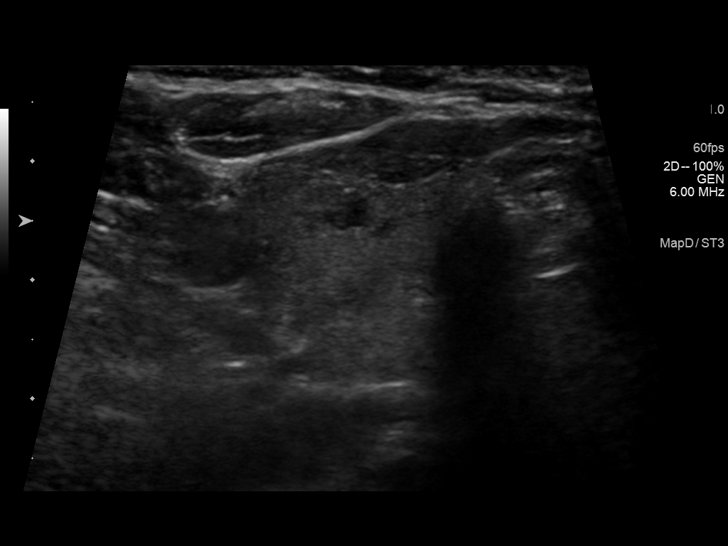
[im 28/68]
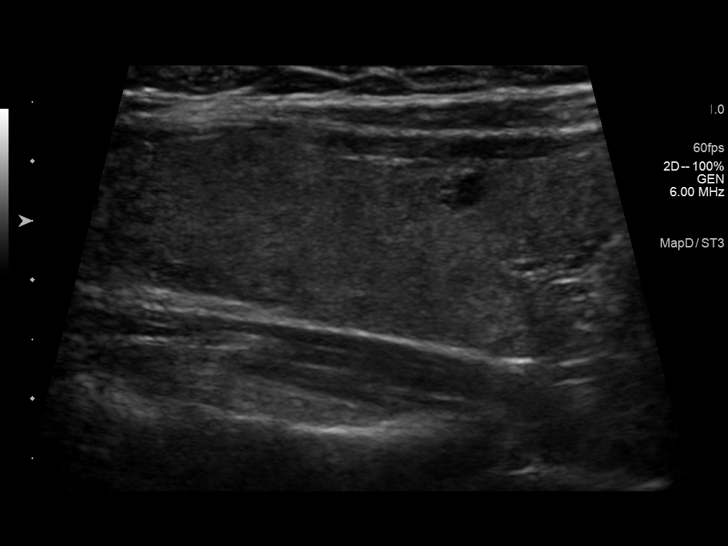
[im 34/68]
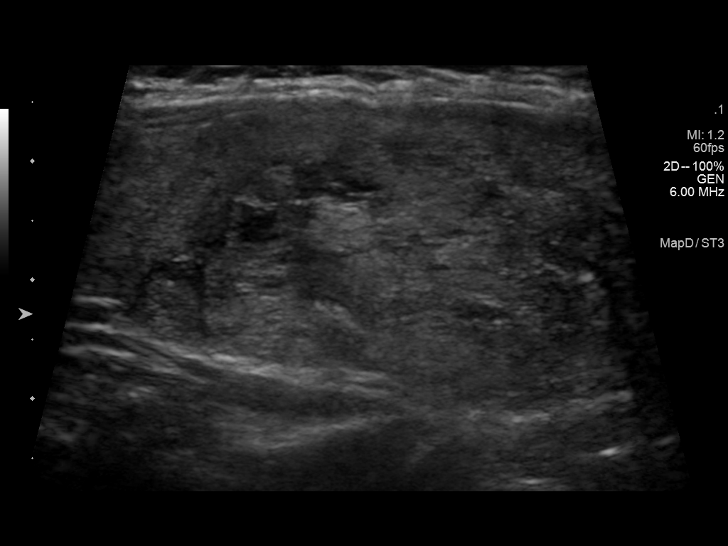
[im 40/68]
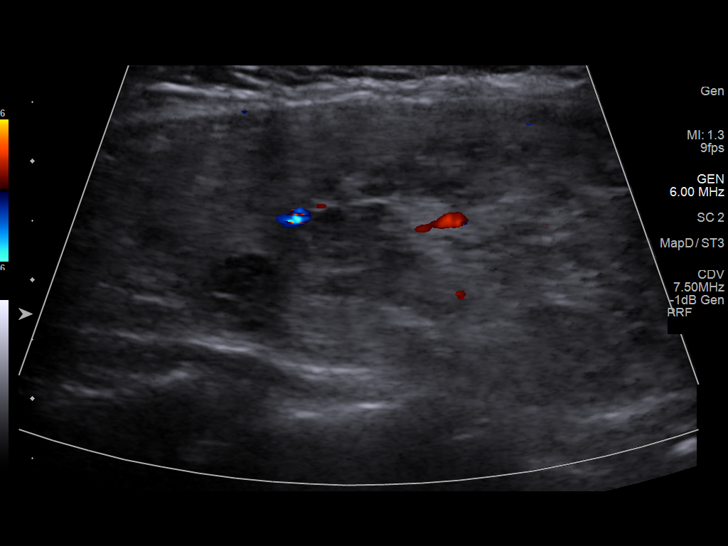
[im 45/68]
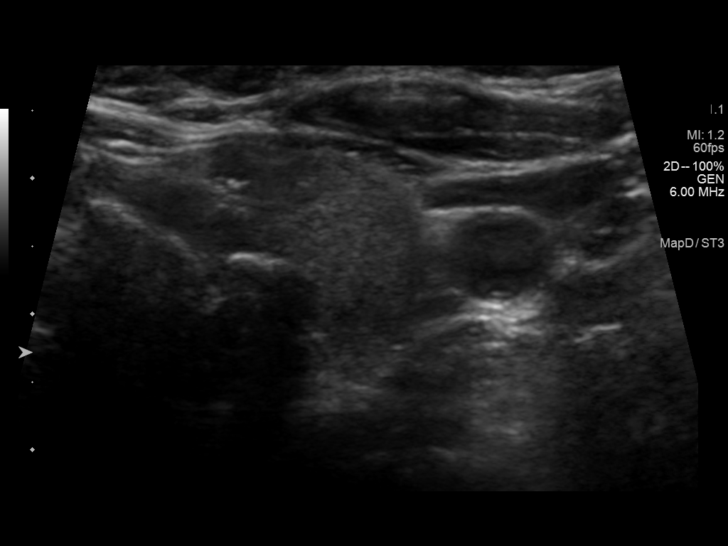
[im 51/68]
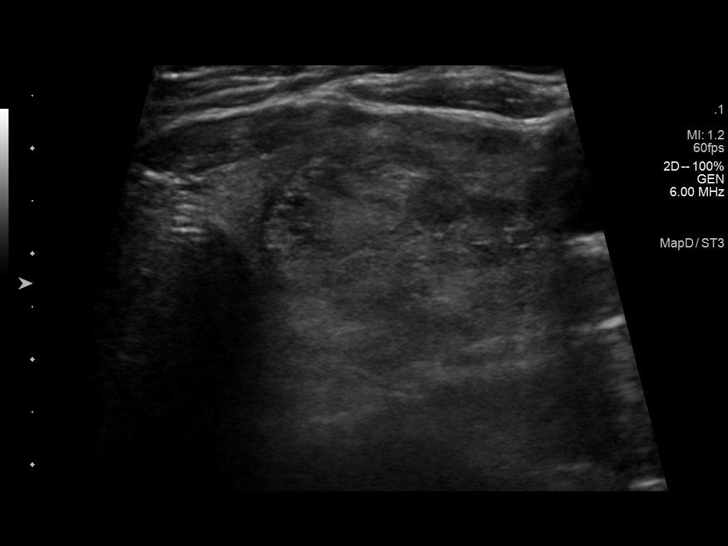
[im 56/68]
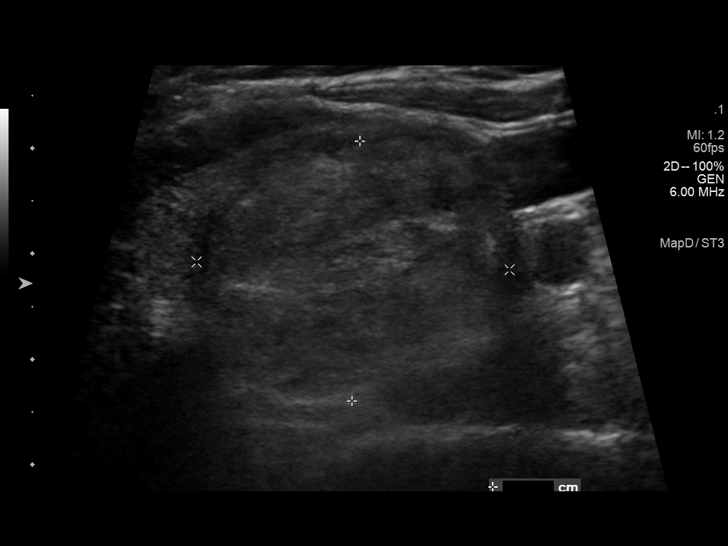
[im 62/68]
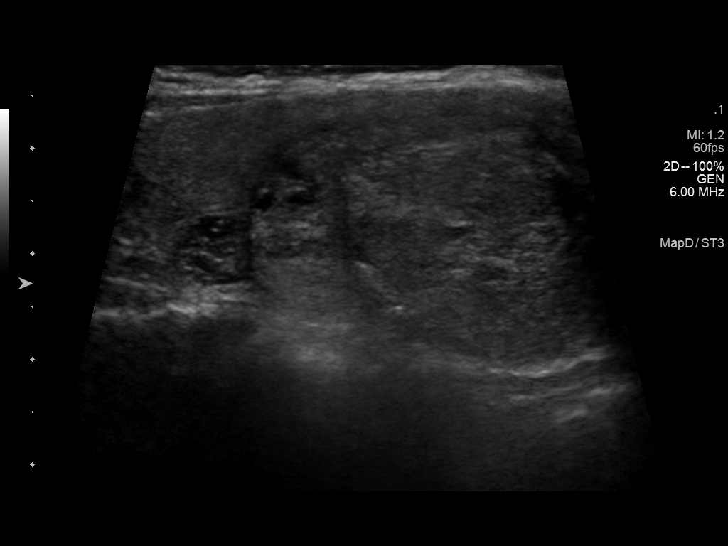
[im 68/68]
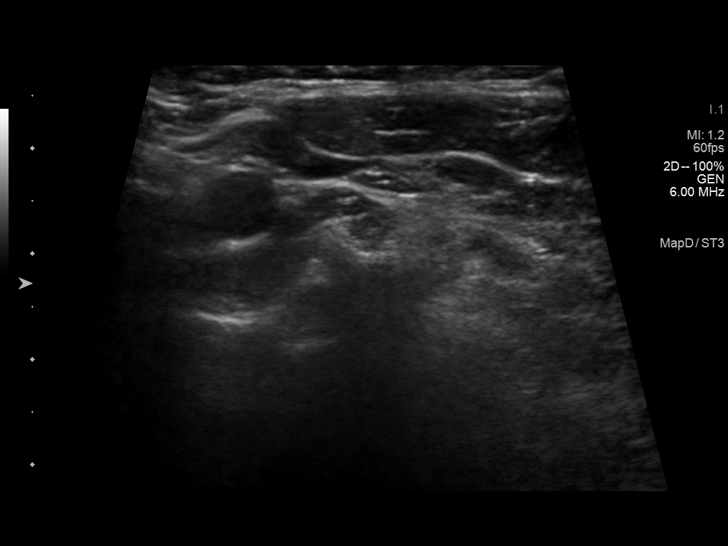

[13 of 25 positions shown; findings below may reference images not displayed]

FINDINGS: Parenchymal Echotexture: Mildly heterogenous

Isthmus: 0.2 cm

Right lobe: 4.7 cm x 1.6 cm x 1.8 cm

Left lobe: 5.0 cm x 2.7 cm x 3.1 cm

_________________________________________________________

Estimated total number of nodules >/= 1 cm: 2

Number of spongiform nodules >/=  2 cm not described below (TR1): 0

Number of mixed cystic and solid nodules >/= 1.5 cm not described
below (TR2): 0

_________________________________________________________

Nodule labeled 1 in the inferior right thyroid measures 6 mm, and
does not meet criteria for surveillance or biopsy.

Nodule labeled 3 in the mid left thyroid measures 1.0 cm with
spongiform characteristics and does not meet criteria for
surveillance or biopsy.

Nodule labeled 4 in the inferior left thyroid, previously 4.1 cm,
currently 3.9 cm, TR 4 characteristics. This meets criteria for
biopsy if not already performed.

No adenopathy
IMPRESSION: Multinodular thyroid.

Inferior left thyroid nodule (labeled 4) meets criteria for biopsy,
as designated by the newly established ACR TI-RADS criteria. If not
already performed, referral for biopsy recommended. If a biopsy has
been performed with benign pathology, no further specific follow-up
would be indicated.

Recommendations follow those established by the new ACR TI-RADS
criteria ([HOSPITAL] 8848;[DATE]).

## 2020-09-21 ENCOUNTER — Inpatient Hospital Stay: Payer: No Typology Code available for payment source

## 2020-09-21 ENCOUNTER — Other Ambulatory Visit: Payer: Self-pay

## 2020-09-21 ENCOUNTER — Inpatient Hospital Stay
Payer: No Typology Code available for payment source | Attending: Hematology and Oncology | Admitting: Hematology and Oncology

## 2020-09-21 ENCOUNTER — Telehealth: Payer: Self-pay | Admitting: Hematology and Oncology

## 2020-09-21 ENCOUNTER — Encounter: Payer: Self-pay | Admitting: Hematology and Oncology

## 2020-09-21 VITALS — BP 126/55 | HR 103 | Temp 98.0°F | Resp 18 | Ht 67.0 in | Wt 250.4 lb

## 2020-09-21 DIAGNOSIS — D630 Anemia in neoplastic disease: Secondary | ICD-10-CM | POA: Diagnosis not present

## 2020-09-21 DIAGNOSIS — C8307 Small cell B-cell lymphoma, spleen: Secondary | ICD-10-CM | POA: Diagnosis not present

## 2020-09-21 DIAGNOSIS — Z79899 Other long term (current) drug therapy: Secondary | ICD-10-CM | POA: Diagnosis not present

## 2020-09-21 DIAGNOSIS — E041 Nontoxic single thyroid nodule: Secondary | ICD-10-CM

## 2020-09-21 DIAGNOSIS — E1165 Type 2 diabetes mellitus with hyperglycemia: Secondary | ICD-10-CM | POA: Diagnosis not present

## 2020-09-21 DIAGNOSIS — K760 Fatty (change of) liver, not elsewhere classified: Secondary | ICD-10-CM

## 2020-09-21 DIAGNOSIS — D72829 Elevated white blood cell count, unspecified: Secondary | ICD-10-CM | POA: Insufficient documentation

## 2020-09-21 DIAGNOSIS — L02224 Furuncle of groin: Secondary | ICD-10-CM | POA: Insufficient documentation

## 2020-09-21 LAB — CBC WITH DIFFERENTIAL/PLATELET
Abs Immature Granulocytes: 0.47 10*3/uL — ABNORMAL HIGH (ref 0.00–0.07)
Basophils Absolute: 0 10*3/uL (ref 0.0–0.1)
Basophils Relative: 0 %
Eosinophils Absolute: 0.2 10*3/uL (ref 0.0–0.5)
Eosinophils Relative: 0 %
HCT: 32 % — ABNORMAL LOW (ref 36.0–46.0)
Hemoglobin: 9.2 g/dL — ABNORMAL LOW (ref 12.0–15.0)
Immature Granulocytes: 1 %
Lymphocytes Relative: 89 %
Lymphs Abs: 71.7 10*3/uL — ABNORMAL HIGH (ref 0.7–4.0)
MCH: 21.5 pg — ABNORMAL LOW (ref 26.0–34.0)
MCHC: 28.8 g/dL — ABNORMAL LOW (ref 30.0–36.0)
MCV: 74.9 fL — ABNORMAL LOW (ref 80.0–100.0)
Monocytes Absolute: 2.3 10*3/uL — ABNORMAL HIGH (ref 0.1–1.0)
Monocytes Relative: 3 %
Neutro Abs: 5.3 10*3/uL (ref 1.7–7.7)
Neutrophils Relative %: 7 %
Platelets: 171 10*3/uL (ref 150–400)
RBC: 4.27 MIL/uL (ref 3.87–5.11)
RDW: 17.6 % — ABNORMAL HIGH (ref 11.5–15.5)
WBC: 80 10*3/uL (ref 4.0–10.5)
nRBC: 0.1 % (ref 0.0–0.2)

## 2020-09-21 LAB — COMPREHENSIVE METABOLIC PANEL
ALT: 13 U/L (ref 0–44)
AST: 19 U/L (ref 15–41)
Albumin: 3.5 g/dL (ref 3.5–5.0)
Alkaline Phosphatase: 100 U/L (ref 38–126)
Anion gap: 14 (ref 5–15)
BUN: 14 mg/dL (ref 6–20)
CO2: 25 mmol/L (ref 22–32)
Calcium: 9.6 mg/dL (ref 8.9–10.3)
Chloride: 103 mmol/L (ref 98–111)
Creatinine, Ser: 0.88 mg/dL (ref 0.44–1.00)
GFR, Estimated: >60 mL/min (ref >60)
Glucose, Bld: 173 mg/dL — ABNORMAL HIGH (ref 70–99)
Potassium: 4.2 mmol/L (ref 3.5–5.1)
Sodium: 142 mmol/L (ref 135–145)
Total Bilirubin: 0.7 mg/dL (ref 0.3–1.2)
Total Protein: 6.9 g/dL (ref 6.5–8.1)

## 2020-09-21 MED ORDER — CEPHALEXIN 500 MG PO CAPS: 500.0000 mg | ORAL_CAPSULE | Freq: Three times a day (TID) | ORAL | 0 refills | Status: DC

## 2020-09-21 NOTE — Assessment & Plan Note (Signed)
She has poorly controlled diabetes, mildly improved since recent increased dose of Trulicity We discussed the importance of aggressive dietary modification She will likely need some form of steroid treatment in the future that could exacerbate her diabetes

## 2020-09-21 NOTE — Telephone Encounter (Signed)
Scheduled appt per 12/10 sch msg - called pt / no answer. Left message for patient with appt date and time

## 2020-09-21 NOTE — Progress Notes (Signed)
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 Given the significant elevated white count, I believe she have disease progression I recommend CT imaging next week and see her back to discuss treatment options  Anemia in neoplastic disease She has multifactorial anemia, component of anemia chronic disease due to poorly controlled diabetes as well as her malignancy She does not need transfusion support right  Boil of groin She has skin infection in her groin that is not healing Given her poorly controlled diabetes, I am concerned she could be at risk of worsening disease I recommend 10-day course of oral antibiotic therapy and I will reassess next week  Diabetes mellitus type 2, uncontrolled ([place]) She has poorly controlled diabetes, mildly improved since recent increased dose of Trulicity We discussed the importance of aggressive dietary modification She will likely need some form of steroid treatment in the future that could exacerbate her diabetes   Orders Placed This Encounter  Procedures  . CT CHEST ABDOMEN PELVIS W CONTRAST    Standing Status:   Future    Standing Expiration Date:   09/21/2021    Order Specific Question:   Preferred imaging location?    Answer:   [facility]    Order Specific Question:   Radiology Contrast Protocol - do NOT remove file path    Answer:   [email or web address]    Order Specific Question:   Is patient pregnant?    Answer:   No    All questions were answered. The patient knows to call the clinic with any problems, questions or concerns. The total time spent in the  appointment was 30 minutes encounter with patients including review of chart and various tests results, discussions about plan of care and coordination of care plan   Heath Lark, MD 09/21/2020 8:49 AM  INTERVAL HISTORY: Please see below for problem oriented charting. She returns for further follow-up for lymphoma Since last time I saw her, she has maintained her weight Her diabetes control was suboptimal and the dose of Trulicity was increased and that led to mild improvement She is trying her best to eat healthy She denies recent lymphadenopathy She has ongoing groin infection of her skin No recent fever or chills  SUMMARY OF ONCOLOGIC HISTORY: Oncology History  Splenic marginal zone b-cell lymphoma ([place])  01/11/2009 Initial Diagnosis   Splenic marginal zone b-cell lymphoma   06/06/2014 Bone Marrow Biopsy   Bone marrow aspirate and biopsy confirmed extensive involvement by CD20 positive non-Hodgkin's lymphoma.   06/07/2014 Imaging   She had a PET scan which showed predominant splenic involvement.   06/16/2014 - 07/07/2014 Chemotherapy   She started on weekly rituximab.   06/16/2014 Adverse Reaction   She had infusion reaction, resolved with IV dexamethasone.   08/18/2014 Imaging   PET CT scan show resolution of hypermetabolic activity.   04/08/2016 Imaging   1. No evidence for aortic dissection or vascular injury. 2. Mild atherosclerotic changes within the abdominal aorta and branch vessels without aneurysm. 3. No significant adenopathy or evidence for recurrent lymphoma. 4. Stable mild splenomegaly. 5. 5 mm nodule in the right lower lobe demonstrates interval increase in size. No follow-up needed if patient is low-risk. Non-contrast chest  CT can be considered in 12 months if patient is high-risk.  6. Degenerate changes at the SI joints bilaterally.   01/21/2018 Imaging   1. Persistent mild splenomegaly, similar to prior examinations. No other findings to suggest recurrent disease  in the chest, abdomen or pelvis. 2. Aortic atherosclerosis, in addition to two vessel coronary artery disease. Please note that although the presence of coronary artery calcium documents the presence of coronary artery disease, the severity of this disease and any potential stenosis cannot be assessed on this non-gated CT examination. Assessment for potential risk factor modification, dietary therapy or pharmacologic therapy may be warranted, if clinically indicated.  3. Enlarging heterogeneously enhancing and partially calcified left thyroid lobe nodule. Further evaluation with nonemergent thyroid ultrasound is recommended in the near future to better evaluate this lesion and determine if there is a need for fine-needle aspiration. 4. Additional incidental findings, as above.     REVIEW OF SYSTEMS:   Constitutional: Denies fevers, chills or abnormal weight loss Eyes: Denies blurriness of vision Ears, nose, mouth, throat, and face: Denies mucositis or sore throat Respiratory: Denies cough, dyspnea or wheezes Cardiovascular: Denies palpitation, chest discomfort or lower extremity swelling Gastrointestinal:  Denies nausea, heartburn or change in bowel habits Skin: Denies abnormal skin rashes Lymphatics: Denies new lymphadenopathy or easy bruising Neurological:Denies numbness, tingling or new weaknesses Behavioral/Psych: Mood is stable, no new changes  All other systems were reviewed with the patient and are negative.  I have reviewed the past medical history, past surgical history, social history and family history with the patient and they are unchanged from previous note.  ALLERGIES:  is allergic to erythromycin, lovastatin, and tolnaftate.  MEDICATIONS:  Current Outpatient Medications  Medication Sig Dispense Refill  . hydrOXYzine (ATARAX/VISTARIL) 25 MG tablet Take 25 mg by mouth 3 (three) times daily.    . TRULICITY 4.5 OF/7.5ZW SOPN Inject 4.5 mg into the skin once a week.    Marland Kitchen  acetaminophen (TYLENOL) 500 MG tablet Take 1 tablet (500 mg total) by mouth every 8 (eight) hours as needed (pain). 30 tablet 0  . aspirin 81 MG chewable tablet Chew 1 tablet (81 mg total) by mouth daily. 30 tablet 0  . Bempedoic Acid (NEXLETOL) 180 MG TABS Take 1 tablet by mouth daily.    . cephALEXin (KEFLEX) 500 MG capsule Take 1 capsule (500 mg total) by mouth 3 (three) times daily. 30 capsule 0  . fish oil-omega-3 fatty acids 1000 MG capsule Take 1 g by mouth 2 (two) times daily.     . furosemide (LASIX) 40 MG tablet Take 40 mg by mouth as needed.    Marland Kitchen losartan (COZAAR) 25 MG tablet Take 25 mg by mouth daily.    . metFORMIN (GLUCOPHAGE) 1000 MG tablet Take 1,000 mg by mouth 2 (two) times daily with a meal.      . metoprolol tartrate (LOPRESSOR) 100 MG tablet Take 1 tablet (100 mg total) by mouth 2 (two) times daily. PT OVERDUE FOR OV PLEASE CALL FOR APPT 60 tablet 0  . nitroGLYCERIN (NITROSTAT) 0.4 MG SL tablet Place 1 tablet (0.4 mg total) under the tongue every 5 (five) minutes as needed for chest pain. 30 tablet 0  . valACYclovir (VALTREX) 500 MG tablet Take 500 mg by mouth 2 (two) times daily as needed. Takes for outbreaks only.     No current facility-administered medications for this visit.    PHYSICAL EXAMINATION: ECOG PERFORMANCE STATUS: 1 - Symptomatic but completely ambulatory  Vitals:  09/21/20 0815  BP: (!) 126/55  Pulse: (!) 103  Resp: 18  Temp: 98 F (36.7 C)  SpO2: 100%   Filed Weights   09/21/20 0815  Weight: 250 lb 6.4 oz (113.6 kg)    GENERAL:alert, no distress and comfortable SKIN: skin color, texture, turgor are normal, no rashes or significant lesions EYES: normal, Conjunctiva are pink and non-injected, sclera clear OROPHARYNX:no exudate, no erythema and lips, buccal mucosa, and tongue normal  NECK: She has mild thyromegaly . LYMPH:  no palpable lymphadenopathy in the cervical, axillary or inguinal LUNGS: clear to auscultation and percussion with  normal breathing effort HEART: Tachycardia, no murmurs and no lower extremity edema ABDOMEN:abdomen soft, non-tender and normal bowel sounds Musculoskeletal:no cyanosis of digits and no clubbing  NEURO: alert & oriented x 3 with fluent speech, no focal motor/sensory deficits  LABORATORY DATA:  I have reviewed the data as listed    Component Value Date/Time   NA 142 09/21/2020 0749   NA 138 07/24/2017 1105   K 4.2 09/21/2020 0749   K 4.2 07/24/2017 1105   CL 103 09/21/2020 0749   CL 106 11/19/2012 0837   CO2 25 09/21/2020 0749   CO2 26 07/24/2017 1105   GLUCOSE 173 (H) 09/21/2020 0749   GLUCOSE 362 (H) 07/24/2017 1105   GLUCOSE 107 (H) 11/19/2012 0837   BUN 14 09/21/2020 0749   BUN 10.0 07/24/2017 1105   CREATININE 0.88 09/21/2020 0749   CREATININE 0.91 10/22/2018 0809   CREATININE 0.8 07/24/2017 1105   CALCIUM 9.6 09/21/2020 0749   CALCIUM 8.8 07/24/2017 1105   PROT 6.9 09/21/2020 0749   PROT 6.3 (L) 07/24/2017 1105   ALBUMIN 3.5 09/21/2020 0749   ALBUMIN 3.4 (L) 07/24/2017 1105   AST 19 09/21/2020 0749   AST 11 (L) 10/22/2018 0809   AST 13 07/24/2017 1105   ALT 13 09/21/2020 0749   ALT 16 10/22/2018 0809   ALT 21 07/24/2017 1105   ALKPHOS 100 09/21/2020 0749   ALKPHOS 112 07/24/2017 1105   BILITOT 0.7 09/21/2020 0749   BILITOT 0.7 10/22/2018 0809   BILITOT 0.55 07/24/2017 1105   GFRNONAA >60 09/21/2020 0749   GFRNONAA >60 10/22/2018 0809   GFRAA >60 05/18/2020 0825   GFRAA >60 10/22/2018 0809    No results found for: SPEP, UPEP  Lab Results  Component Value Date   WBC 80.0 (HH) 09/21/2020   NEUTROABS 5.3 09/21/2020   HGB 9.2 (L) 09/21/2020   HCT 32.0 (L) 09/21/2020   MCV 74.9 (L) 09/21/2020   PLT 171 09/21/2020      Chemistry      Component Value Date/Time   NA 142 09/21/2020 0749   NA 138 07/24/2017 1105   K 4.2 09/21/2020 0749   K 4.2 07/24/2017 1105   CL 103 09/21/2020 0749   CL 106 11/19/2012 0837   CO2 25 09/21/2020 0749   CO2 26 07/24/2017  1105   BUN 14 09/21/2020 0749   BUN 10.0 07/24/2017 1105   CREATININE 0.88 09/21/2020 0749   CREATININE 0.91 10/22/2018 0809   CREATININE 0.8 07/24/2017 1105      Component Value Date/Time   CALCIUM 9.6 09/21/2020 0749   CALCIUM 8.8 07/24/2017 1105   ALKPHOS 100 09/21/2020 0749   ALKPHOS 112 07/24/2017 1105   AST 19 09/21/2020 0749   AST 11 (L) 10/22/2018 0809   AST 13 07/24/2017 1105   ALT 13 09/21/2020 0749   ALT 16 10/22/2018 0809   ALT 21 07/24/2017  1105   BILITOT 0.7 09/21/2020 0749   BILITOT 0.7 10/22/2018 0809   BILITOT 0.55 07/24/2017 1105

## 2020-09-21 NOTE — Assessment & Plan Note (Signed)
She has skin infection in her groin that is not healing Given her poorly controlled diabetes, I am concerned she could be at risk of worsening disease I recommend 10-day course of oral antibiotic therapy and I will reassess next week

## 2020-09-21 NOTE — Assessment & Plan Note (Signed)
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

## 2020-09-21 NOTE — Assessment & Plan Note (Signed)
She has multifactorial anemia, component of anemia chronic disease due to poorly controlled diabetes as well as her malignancy She does not need transfusion support right

## 2020-09-28 ENCOUNTER — Ambulatory Visit (HOSPITAL_BASED_OUTPATIENT_CLINIC_OR_DEPARTMENT_OTHER)
Admission: RE | Admit: 2020-09-28 | Discharge: 2020-09-28 | Disposition: A | Discharge status: Home / Self Care | Payer: No Typology Code available for payment source | Source: Ambulatory Visit | Attending: Hematology and Oncology | Admitting: Hematology and Oncology

## 2020-09-28 ENCOUNTER — Inpatient Hospital Stay (HOSPITAL_BASED_OUTPATIENT_CLINIC_OR_DEPARTMENT_OTHER): Payer: No Typology Code available for payment source | Admitting: Hematology and Oncology

## 2020-09-28 ENCOUNTER — Encounter: Payer: Self-pay | Admitting: Hematology and Oncology

## 2020-09-28 ENCOUNTER — Other Ambulatory Visit: Payer: Self-pay

## 2020-09-28 VITALS — BP 129/78 | HR 106 | Temp 97.5°F | Resp 18 | Ht 67.0 in | Wt 250.0 lb

## 2020-09-28 DIAGNOSIS — D630 Anemia in neoplastic disease: Secondary | ICD-10-CM | POA: Diagnosis not present

## 2020-09-28 DIAGNOSIS — C8307 Small cell B-cell lymphoma, spleen: Secondary | ICD-10-CM

## 2020-09-28 DIAGNOSIS — Z7189 Other specified counseling: Secondary | ICD-10-CM

## 2020-09-28 DIAGNOSIS — D539 Nutritional anemia, unspecified: Secondary | ICD-10-CM | POA: Insufficient documentation

## 2020-09-28 DIAGNOSIS — E1165 Type 2 diabetes mellitus with hyperglycemia: Secondary | ICD-10-CM

## 2020-09-28 DIAGNOSIS — E041 Nontoxic single thyroid nodule: Secondary | ICD-10-CM | POA: Insufficient documentation

## 2020-09-28 MED ORDER — ALLOPURINOL 300 MG PO TABS: 300.0000 mg | ORAL_TABLET | Freq: Every day | ORAL | 3 refills | Status: DC

## 2020-09-28 MED ORDER — PROCHLORPERAZINE MALEATE 10 MG PO TABS: 10.0000 mg | ORAL_TABLET | Freq: Four times a day (QID) | ORAL | 1 refills | Status: DC | PRN

## 2020-09-28 MED ORDER — IOHEXOL 300 MG/ML  SOLN
100.0000 mL | Freq: Once | INTRAMUSCULAR | Status: AC | PRN
  Administered 2020-09-28: 100 mL via INTRAVENOUS

## 2020-09-28 MED ORDER — ONDANSETRON HCL 8 MG PO TABS: 8.0000 mg | ORAL_TABLET | Freq: Three times a day (TID) | ORAL | 1 refills | Status: DC | PRN

## 2020-09-28 MED ORDER — ACYCLOVIR 400 MG PO TABS: 400.0000 mg | ORAL_TABLET | Freq: Every day | ORAL | 3 refills | Status: DC

## 2020-09-28 NOTE — Progress Notes (Signed)
[facility] OFFICE PROGRESS NOTE  Patient Care Team: Lennie Odor, [place] as PCP - General (Nurse Practitioner) Heath Lark, MD as Consulting Physician (Hematology and Oncology)  ASSESSMENT & PLAN:  Splenic marginal zone b-cell lymphoma ([place]) Clinically, on imaging studies, she only had splenic enlargement However, she is anemic and has history of bone marrow involvement It is possible she had more extensive disease For now, I do not recommend bone marrow biopsy as it will not change management With recurrent disease and poor tolerance to steroids, I think single agent rituximab would be at high risk especially with prior history of infusion reactions If she to single agent rituximab only, I would have to cytoreduced with high-dose prednisone which would be hard to tolerate given her poorly controlled diabetes We discussed the risk and benefits of chemotherapy with immunotherapy and reviewed the guidelines  We discussed the role of chemotherapy is of palliative intent The decision was made based on publication in the Blood: Randomized trial of bendamustine-rituximab or R-CHOP/R-CVP in first-line treatment of indolent NHL or MCL: the BRIGHT study.  [place] [address] Lucianne Lei der [address] [place] BS, [address] M, Kwan YL, Simpson D, Craig M, Kolibaba K, Issa S, Porter, Brainard DM, Munteanu M, Aram Beecham JM SO  Blood. 2014;123(19):2944.  The chemotherapy consists of   1. Bendamustine at 90 mg/m2 on day 1 & 2 2. Rituximab at 375 mg/m2 on day 1 Each cycle + 28 days  Plan for 6 cycles total with or without Neulasta support  In the international phase III BRIGHT trial, 447 previously untreated patients with advanced stage follicular (n = 220 patients), mantle cell (n = 74 patients), or other indolent lymphoma were randomly assigned to six cycles of BR according to the same dose and schedule described above or to R-CHOP or R-CVP. BR resulted in similar  complete (31 versus 25 percent) and overall (97 versus 91 percent) response rates. BR was associated with higher rates of vomiting and drug hypersensitivity and lower rates of peripheral neuropathy/paresthesia and alopecia. The use of prophylactic antiemetics was not specified in the protocol and was more common among patients assigned to R-CHOP.   We discussed some of the risks, benefits and side-effects of Rituximab with Bendamustine.   Some of the short term side-effects included, though not limited to, risk of fatigue, weight loss, tumor lysis syndrome, risk of allergic reactions, pancytopenia, life-threatening infections, need for transfusions of blood products, nausea, vomiting, change in bowel habits, admission to hospital for various reasons, and risks of death.   Long term side-effects are also discussed including permanent damage to nerve function, chronic fatigue, and rare secondary malignancy including bone marrow disorders.   The patient is aware that the response rates discussed earlier is not guaranteed.    After a long discussion, patient made an informed decision to proceed with the prescribed plan of care.   Patient education material was dispensed I plan to modify her chemotherapy dose upfront by reducing bendamustine to 60 mg/m and for cycle 1 to receive bendamustine only without rituximab to avoid infusion reaction I do not plan prophylactic G-CSF support I recommend daily acyclovir to prevent viral infection and allopurinol to prevent tumor lysis syndrome I will see her on day 2 of cycle 1 for assessment of risk of side effects of treatment Given history of poorly controlled diabetes, I plan to reduce dexamethasone to half the dose     Anemia in neoplastic  disease She is not symptomatic I plan to recheck iron studies and vitamin B12 level  Diabetes mellitus type 2, uncontrolled ([place]) She is aware that dexamethasone will be given during treatment I recommend the  patient to be aggressive with dietary modification and close monitoring of her blood sugar while on  Goals of care, counseling/discussion I believe I can get her into remission without difficulties but given her young age, it is possible and very likely that her disease may recur in the future After she completes 6 cycles of chemotherapy, I plan to put her on maintenance rituximab Hence, the goal of treatment is palliative, as the treatment is not considered curative   Orders Placed This Encounter  Procedures  . Hepatitis B surface antibody,qualitative    Standing Status:   Standing    Number of Occurrences:   1    Standing Expiration Date:   09/28/2021  . Hepatitis B core antibody, IgM    Standing Status:   Standing    Number of Occurrences:   1    Standing Expiration Date:   09/28/2021  . Hepatitis C antibody    Standing Status:   Standing    Number of Occurrences:   1    Standing Expiration Date:   09/28/2021  . Uric acid    Standing Status:   Standing    Number of Occurrences:   4    Standing Expiration Date:   09/28/2021  . CBC with Differential (Cancer Center Only)    Standing Status:   Standing    Number of Occurrences:   20    Standing Expiration Date:   09/28/2021  . CMP ([facility] only)    Standing Status:   Standing    Number of Occurrences:   20    Standing Expiration Date:   09/28/2021  . Iron and TIBC    Standing Status:   Future    Standing Expiration Date:   09/28/2021  . Vitamin B12    Standing Status:   Future    Standing Expiration Date:   09/28/2021  . Ferritin    Standing Status:   Future    Standing Expiration Date:   09/28/2021    All questions were answered. The patient knows to call the clinic with any problems, questions or concerns. The total time spent in the appointment was 55 minutes encounter with patients including review of chart and various tests results, discussions about plan of care and coordination of care plan   Heath Lark,  MD 09/28/2020 3:23 PM  INTERVAL HISTORY: Please see below for problem oriented charting. She returns with her sister, Helene Kelp to review test results Since last time I saw her, she is trying to modify her diet Her blood sugar control has improved recently  SUMMARY OF ONCOLOGIC HISTORY: Oncology History  Splenic marginal zone b-cell lymphoma ([place])  01/11/2009 Initial Diagnosis   Splenic marginal zone b-cell lymphoma   06/06/2014 Bone Marrow Biopsy   Bone marrow aspirate and biopsy confirmed extensive involvement by CD20 positive non-Hodgkin's lymphoma.   06/07/2014 Imaging   She had a PET scan which showed predominant splenic involvement.   06/16/2014 - 07/07/2014 Chemotherapy   She started on weekly rituximab.   06/16/2014 Adverse Reaction   She had infusion reaction, resolved with IV dexamethasone.   08/18/2014 Imaging   PET CT scan show resolution of hypermetabolic activity.   04/08/2016 Imaging   1. No evidence for aortic dissection or vascular injury. 2. Mild atherosclerotic  changes within the abdominal aorta and branch vessels without aneurysm. 3. No significant adenopathy or evidence for recurrent lymphoma. 4. Stable mild splenomegaly. 5. 5 mm nodule in the right lower lobe demonstrates interval increase in size. No follow-up needed if patient is low-risk. Non-contrast chest CT can be considered in 12 months if patient is high-risk.  6. Degenerate changes at the SI joints bilaterally.   01/21/2018 Imaging   1. Persistent mild splenomegaly, similar to prior examinations. No other findings to suggest recurrent disease in the chest, abdomen or pelvis. 2. Aortic atherosclerosis, in addition to two vessel coronary artery disease. Please note that although the presence of coronary artery calcium documents the presence of coronary artery disease, the severity of this disease and any potential stenosis cannot be assessed on this non-gated CT examination. Assessment for potential risk factor  modification, dietary therapy or pharmacologic therapy may be warranted, if clinically indicated.  3. Enlarging heterogeneously enhancing and partially calcified left thyroid lobe nodule. Further evaluation with nonemergent thyroid ultrasound is recommended in the near future to better evaluate this lesion and determine if there is a need for fine-needle aspiration. 4. Additional incidental findings, as above.   09/28/2020 Imaging   1. Progressive splenomegaly. The splenic volume has almost doubled since 2019. 2. No adenopathy in the chest, abdomen or pelvis. 3. Stable fibroid uterus. 4. Moderate stool throughout the colon and down into the rectum suggesting constipation.   Aortic Atherosclerosis (ICD10-I70.0).   09/28/2020 Cancer Staging   Staging form: Hodgkin and Non-Hodgkin Lymphoma, AJCC 8th Edition - Clinical stage from 09/28/2020: Stage IE (Marginal zone lymphoma) - Signed by Heath Lark, MD on 09/28/2020   10/15/2020 -  Chemotherapy   The patient had PALONOSETRON HCL INJECTION 0.25 MG/5ML, 0.25 mg, Intravenous,  Once, 0 of 3 cycles bendamustine (BENDEKA) 150 mg in sodium chloride 0.9 % 50 mL (2.6786 mg/mL) chemo infusion, 60 mg/m2 = 150 mg (66.7 % of original dose 90 mg/m2), Intravenous,  Once, 0 of 4 cycles Dose modification: 60 mg/m2 (66.7 % of original dose 90 mg/m2, Cycle 1, Reason: Dose Not Tolerated) riTUXimab-pvvr (RUXIENCE) 900 mg in sodium chloride 0.9 % 250 mL (2.6471 mg/mL) infusion, 375 mg/m2, Intravenous,  Once, 0 of 3 cycles  for chemotherapy treatment.      REVIEW OF SYSTEMS:   Constitutional: Denies fevers, chills or abnormal weight loss Eyes: Denies blurriness of vision Ears, nose, mouth, throat, and face: Denies mucositis or sore throat Respiratory: Denies cough, dyspnea or wheezes Cardiovascular: Denies palpitation, chest discomfort or lower extremity swelling Gastrointestinal:  Denies nausea, heartburn or change in bowel habits Skin: Denies abnormal skin  rashes Lymphatics: Denies new lymphadenopathy or easy bruising Neurological:Denies numbness, tingling or new weaknesses Behavioral/Psych: Mood is stable, no new changes  All other systems were reviewed with the patient and are negative.  I have reviewed the past medical history, past surgical history, social history and family history with the patient and they are unchanged from previous note.  ALLERGIES:  is allergic to erythromycin, lovastatin, and tolnaftate.  MEDICATIONS:  Current Outpatient Medications  Medication Sig Dispense Refill  . acetaminophen (TYLENOL) 500 MG tablet Take 1 tablet (500 mg total) by mouth every 8 (eight) hours as needed (pain). 30 tablet 0  . acyclovir (ZOVIRAX) 400 MG tablet Take 1 tablet (400 mg total) by mouth daily. 30 tablet 3  . allopurinol (ZYLOPRIM) 300 MG tablet Take 1 tablet (300 mg total) by mouth daily. 30 tablet 3  . aspirin 81 MG chewable tablet  Chew 1 tablet (81 mg total) by mouth daily. 30 tablet 0  . Bempedoic Acid (NEXLETOL) 180 MG TABS Take 1 tablet by mouth daily.    . cephALEXin (KEFLEX) 500 MG capsule Take 1 capsule (500 mg total) by mouth 3 (three) times daily. 30 capsule 0  . fish oil-omega-3 fatty acids 1000 MG capsule Take 1 g by mouth 2 (two) times daily.     . furosemide (LASIX) 40 MG tablet Take 40 mg by mouth as needed.    . hydrOXYzine (ATARAX/VISTARIL) 25 MG tablet Take 25 mg by mouth 3 (three) times daily.    Marland Kitchen losartan (COZAAR) 25 MG tablet Take 25 mg by mouth daily.    . metFORMIN (GLUCOPHAGE) 1000 MG tablet Take 1,000 mg by mouth 2 (two) times daily with a meal.      . metoprolol tartrate (LOPRESSOR) 100 MG tablet Take 1 tablet (100 mg total) by mouth 2 (two) times daily. PT OVERDUE FOR OV PLEASE CALL FOR APPT 60 tablet 0  . nitroGLYCERIN (NITROSTAT) 0.4 MG SL tablet Place 1 tablet (0.4 mg total) under the tongue every 5 (five) minutes as needed for chest pain. 30 tablet 0  . ondansetron (ZOFRAN) 8 MG tablet Take 1 tablet (8 mg  total) by mouth every 8 (eight) hours as needed for refractory nausea / vomiting. Start on day 2 after bendamustine chemo. 30 tablet 1  . prochlorperazine (COMPAZINE) 10 MG tablet Take 1 tablet (10 mg total) by mouth every 6 (six) hours as needed (Nausea or vomiting). 30 tablet 1  . TRULICITY 4.5 EX/5.1ZG SOPN Inject 4.5 mg into the skin once a week.    . valACYclovir (VALTREX) 500 MG tablet Take 500 mg by mouth 2 (two) times daily as needed. Takes for outbreaks only.     No current facility-administered medications for this visit.    PHYSICAL EXAMINATION: ECOG PERFORMANCE STATUS: 1 - Symptomatic but completely ambulatory  Vitals:   09/28/20 1424  BP: 129/78  Pulse: (!) 106  Resp: 18  Temp: (!) 97.5 F (36.4 C)  SpO2: 100%   Filed Weights   09/28/20 1424  Weight: 250 lb (113.4 kg)    GENERAL:alert, no distress and comfortable NEURO: alert & oriented x 3 with fluent speech, no focal motor/sensory deficits  LABORATORY DATA:  I have reviewed the data as listed    Component Value Date/Time   NA 142 09/21/2020 0749   NA 138 07/24/2017 1105   K 4.2 09/21/2020 0749   K 4.2 07/24/2017 1105   CL 103 09/21/2020 0749   CL 106 11/19/2012 0837   CO2 25 09/21/2020 0749   CO2 26 07/24/2017 1105   GLUCOSE 173 (H) 09/21/2020 0749   GLUCOSE 362 (H) 07/24/2017 1105   GLUCOSE 107 (H) 11/19/2012 0837   BUN 14 09/21/2020 0749   BUN 10.0 07/24/2017 1105   CREATININE 0.88 09/21/2020 0749   CREATININE 0.91 10/22/2018 0809   CREATININE 0.8 07/24/2017 1105   CALCIUM 9.6 09/21/2020 0749   CALCIUM 8.8 07/24/2017 1105   PROT 6.9 09/21/2020 0749   PROT 6.3 (L) 07/24/2017 1105   ALBUMIN 3.5 09/21/2020 0749   ALBUMIN 3.4 (L) 07/24/2017 1105   AST 19 09/21/2020 0749   AST 11 (L) 10/22/2018 0809   AST 13 07/24/2017 1105   ALT 13 09/21/2020 0749   ALT 16 10/22/2018 0809   ALT 21 07/24/2017 1105   ALKPHOS 100 09/21/2020 0749   ALKPHOS 112 07/24/2017 1105   BILITOT 0.7 09/21/2020  0749    BILITOT 0.7 10/22/2018 0809   BILITOT 0.55 07/24/2017 1105   GFRNONAA >60 09/21/2020 0749   GFRNONAA >60 10/22/2018 0809   GFRAA >60 05/18/2020 0825   GFRAA >60 10/22/2018 0809    No results found for: SPEP, UPEP  Lab Results  Component Value Date   WBC 80.0 (HH) 09/21/2020   NEUTROABS 5.3 09/21/2020   HGB 9.2 (L) 09/21/2020   HCT 32.0 (L) 09/21/2020   MCV 74.9 (L) 09/21/2020   PLT 171 09/21/2020      Chemistry      Component Value Date/Time   NA 142 09/21/2020 0749   NA 138 07/24/2017 1105   K 4.2 09/21/2020 0749   K 4.2 07/24/2017 1105   CL 103 09/21/2020 0749   CL 106 11/19/2012 0837   CO2 25 09/21/2020 0749   CO2 26 07/24/2017 1105   BUN 14 09/21/2020 0749   BUN 10.0 07/24/2017 1105   CREATININE 0.88 09/21/2020 0749   CREATININE 0.91 10/22/2018 0809   CREATININE 0.8 07/24/2017 1105      Component Value Date/Time   CALCIUM 9.6 09/21/2020 0749   CALCIUM 8.8 07/24/2017 1105   ALKPHOS 100 09/21/2020 0749   ALKPHOS 112 07/24/2017 1105   AST 19 09/21/2020 0749   AST 11 (L) 10/22/2018 0809   AST 13 07/24/2017 1105   ALT 13 09/21/2020 0749   ALT 16 10/22/2018 0809   ALT 21 07/24/2017 1105   BILITOT 0.7 09/21/2020 0749   BILITOT 0.7 10/22/2018 0809   BILITOT 0.55 07/24/2017 1105       RADIOGRAPHIC STUDIES: I have reviewed CT imaging with the patient I have personally reviewed the radiological images as listed and agreed with the findings in the report. CT CHEST ABDOMEN PELVIS W CONTRAST  Result Date: 09/28/2020 CLINICAL DATA:  Non-Hodgkin's lymphoma.  Restaging. EXAM: CT CHEST, ABDOMEN, AND PELVIS WITH CONTRAST TECHNIQUE: Multidetector CT imaging of the chest, abdomen and pelvis was performed following the standard protocol during bolus administration of intravenous contrast. CONTRAST:  195m OMNIPAQUE IOHEXOL 300 MG/ML  SOLN COMPARISON:  CT scans 01/20/2018 FINDINGS: CT CHEST FINDINGS Cardiovascular: The heart is normal in size. No pericardial effusion. Stable  mild tortuosity of the thoracic aorta and stable calcifications at the aortic arch. Stable three-vessel coronary artery calcifications and I suspect the patient has coronary artery stents in place. The pulmonary arteries are unremarkable. Mediastinum/Nodes: No mediastinal or hilar mass or lymphadenopathy. The esophagus is grossly normal. Stable appearing large left thyroid nodules previously evaluated with ultrasound and biopsy. This has been evaluated on previous imaging. (ref: J Am Coll Radiol. 2015 Feb;12(2): 143-50). Lungs/Pleura: No acute pulmonary findings or worrisome pulmonary lesions. No pleural effusion or pleural lesions. Minimal areas of subpleural dependent atelectasis. Musculoskeletal: No breast masses, supraclavicular or axillary lymphadenopathy. Small scattered lymph nodes are stable. The bony structures are unremarkable. CT ABDOMEN PELVIS FINDINGS Hepatobiliary: No hepatic lesions or intrahepatic biliary dilatation. The gallbladder is normal. No common bile duct dilatation. Pancreas: No mass, inflammation or ductal dilatation. Spleen: Splenomegaly persists and is progressive. The spleen measures 18 x 16.5 x 12 cm (splenic volume 1,752 cubic cm). The spleen previously measured 15 x 12 x 10 cm (splenic volume 900 cc). No focal splenic lesions. Adrenals/Urinary Tract: The adrenal glands and kidneys are unremarkable. The bladder is normal. Stomach/Bowel: The stomach, duodenum, small bowel and colon are unremarkable. No acute inflammatory changes, mass lesions or obstructive findings. The terminal ileum is normal. The appendix is normal. Moderate stool  throughout the colon and down into the rectum suggesting constipation. No colonic mass or obstruction. Vascular/Lymphatic: The aorta and branch vessels are patent. Scattered atherosclerotic calcifications. The major venous structures are patent. Small scattered mesenteric and retroperitoneal lymph nodes. No mass or overt adenopathy. No pelvic adenopathy.  No inguinal adenopathy. Reproductive: Stable fibroid uterus.  The ovaries are unremarkable. Other: No pelvic mass or adenopathy. No free pelvic fluid collections. No inguinal mass or adenopathy. No abdominal wall hernia or subcutaneous lesions. Musculoskeletal: No significant bony findings. IMPRESSION: 1. Progressive splenomegaly. The splenic volume has almost doubled since 2019. 2. No adenopathy in the chest, abdomen or pelvis. 3. Stable fibroid uterus. 4. Moderate stool throughout the colon and down into the rectum suggesting constipation. Aortic Atherosclerosis (ICD10-I70.0). Electronically Signed   By: Marijo Sanes M.D.   On: 09/28/2020 12:59

## 2020-09-28 NOTE — Assessment & Plan Note (Signed)
She is not symptomatic I plan to recheck iron studies and vitamin B12 level

## 2020-09-28 NOTE — Assessment & Plan Note (Signed)
Clinically, on imaging studies, she only had splenic enlargement However, she is anemic and has history of bone marrow involvement It is possible she had more extensive disease For now, I do not recommend bone marrow biopsy as it will not change management With recurrent disease and poor tolerance to steroids, I think single agent rituximab would be at high risk especially with prior history of infusion reactions If she to single agent rituximab only, I would have to cytoreduced with high-dose prednisone which would be hard to tolerate given her poorly controlled diabetes We discussed the risk and benefits of chemotherapy with immunotherapy and reviewed the guidelines  We discussed the role of chemotherapy is of palliative intent The decision was made based on publication in the Blood: Randomized trial of bendamustine-rituximab or R-CHOP/R-CVP in first-line treatment of indolent NHL or MCL: the BRIGHT study.  [place] [address] Lucianne Lei der [address], [place] BS, [address] M, Kwan YL, Simpson D, Craig M, Kolibaba K, Issa S, Adams, Bailey DM, Munteanu M, Aram Beecham JM SO  Blood. 2014;123(19):2944.  The chemotherapy consists of   1. Bendamustine at 90 mg/m2 on day 1 & 2 2. Rituximab at 375 mg/m2 on day 1 Each cycle + 28 days  Plan for 6 cycles total with or without Neulasta support  In the international phase III BRIGHT trial, 447 previously untreated patients with advanced stage follicular (n = 350 patients), mantle cell (n = 74 patients), or other indolent lymphoma were randomly assigned to six cycles of BR according to the same dose and schedule described above or to R-CHOP or R-CVP. BR resulted in similar complete (31 versus 25 percent) and overall (97 versus 91 percent) response rates. BR was associated with higher rates of vomiting and drug hypersensitivity and lower rates of peripheral neuropathy/paresthesia and alopecia. The use of prophylactic antiemetics was not  specified in the protocol and was more common among patients assigned to R-CHOP.   We discussed some of the risks, benefits and side-effects of Rituximab with Bendamustine.   Some of the short term side-effects included, though not limited to, risk of fatigue, weight loss, tumor lysis syndrome, risk of allergic reactions, pancytopenia, life-threatening infections, need for transfusions of blood products, nausea, vomiting, change in bowel habits, admission to hospital for various reasons, and risks of death.   Long term side-effects are also discussed including permanent damage to nerve function, chronic fatigue, and rare secondary malignancy including bone marrow disorders.   The patient is aware that the response rates discussed earlier is not guaranteed.    After a long discussion, patient made an informed decision to proceed with the prescribed plan of care.   Patient education material was dispensed I plan to modify her chemotherapy dose upfront by reducing bendamustine to 60 mg/m and for cycle 1 to receive bendamustine only without rituximab to avoid infusion reaction I do not plan prophylactic G-CSF support I recommend daily acyclovir to prevent viral infection and allopurinol to prevent tumor lysis syndrome I will see her on day 2 of cycle 1 for assessment of risk of side effects of treatment Given history of poorly controlled diabetes, I plan to reduce dexamethasone to half the dose

## 2020-09-28 NOTE — Progress Notes (Signed)
START ON PATHWAY REGIMEN - Lymphoma and CLL     A cycle is every 28 days:     Rituximab-xxxx      Bendamustine   **Always confirm dose/schedule in your pharmacy ordering system**  Patient Characteristics: Marginal Zone Lymphoma, Systemic, Second Line Disease Type: Marginal Zone Lymphoma Disease Type: Not Applicable Disease Type: Not Applicable Localized or Systemic Disease<= Systemic Line of Therapy: Second Line Intent of Therapy: Non-Curative / Palliative Intent, Discussed with Patient

## 2020-09-28 NOTE — Assessment & Plan Note (Signed)
I believe I can get her into remission without difficulties but given her young age, it is possible and very likely that her disease may recur in the future After she completes 6 cycles of chemotherapy, I plan to put her on maintenance rituximab Hence, the goal of treatment is palliative, as the treatment is not considered curative

## 2020-09-28 NOTE — Assessment & Plan Note (Signed)
She is aware that dexamethasone will be given during treatment I recommend the patient to be aggressive with dietary modification and close monitoring of her blood sugar while on

## 2020-10-08 NOTE — Progress Notes (Signed)
Pharmacist Chemotherapy Monitoring - Initial Assessment    Anticipated start date: 10/15/20  Regimen:  . Are orders appropriate based on the patient's diagnosis, regimen, and cycle? Yes . Does the plan date match the patient's scheduled date? Yes . Is the sequencing of drugs appropriate? Yes . Are the premedications appropriate for the patient's regimen? Yes . Prior Authorization for treatment is: Approved o If applicable, is the correct biosimilar selected based on the patient's insurance? yes  Organ Function and Labs: Marland Kitchen Are dose adjustments needed based on the patient's renal function, hepatic function, or hematologic function? No . Are appropriate labs ordered prior to the start of patient's treatment? Yes . Other organ system assessment, if indicated: N/A . The following baseline labs, if indicated, have been ordered: rituximab: baseline Hepatitis B labs  Dose Assessment: . Are the drug doses appropriate? Yes . Are the following correct: o Drug concentrations Yes o IV fluid compatible with drug Yes o Administration routes Yes o Timing of therapy Yes . If applicable, does the patient have documented access for treatment and/or plans for port-a-cath placement? no . If applicable, have lifetime cumulative doses been properly documented and assessed? yes Lifetime Dose Tracking  No doses have been documented on this patient for the following tracked chemicals: Doxorubicin, Epirubicin, Idarubicin, Daunorubicin, Mitoxantrone, Bleomycin, Oxaliplatin, Carboplatin, Liposomal Doxorubicin  o   Toxicity Monitoring/Prevention: . The patient has the following take home antiemetics prescribed: Ondansetron and Prochlorperazine . The patient has the following take home medications prescribed: VZV prophylaxis . Medication allergies and previous infusion related reactions, if applicable, have been reviewed and addressed. Yes . The patient's current medication list has been assessed for drug-drug  interactions with their chemotherapy regimen. no significant drug-drug interactions were identified on review.  Order Review: . Are the treatment plan orders signed? Yes . Is the patient scheduled to see a provider prior to their treatment? No  I verify that I have reviewed each item in the above checklist and answered each question accordingly.   Ebony Hail, Pharm.D., CPP 10/08/2020@2 :49 PM

## 2020-10-15 ENCOUNTER — Telehealth: Payer: Self-pay | Admitting: *Deleted

## 2020-10-15 ENCOUNTER — Other Ambulatory Visit: Payer: Self-pay

## 2020-10-15 ENCOUNTER — Inpatient Hospital Stay: Payer: No Typology Code available for payment source | Attending: Hematology and Oncology

## 2020-10-15 ENCOUNTER — Inpatient Hospital Stay: Payer: No Typology Code available for payment source

## 2020-10-15 VITALS — BP 102/70 | HR 91 | Temp 98.0°F | Resp 18 | Wt 247.5 lb

## 2020-10-15 DIAGNOSIS — Z5111 Encounter for antineoplastic chemotherapy: Secondary | ICD-10-CM | POA: Insufficient documentation

## 2020-10-15 DIAGNOSIS — E1165 Type 2 diabetes mellitus with hyperglycemia: Secondary | ICD-10-CM

## 2020-10-15 DIAGNOSIS — C8307 Small cell B-cell lymphoma, spleen: Secondary | ICD-10-CM | POA: Insufficient documentation

## 2020-10-15 DIAGNOSIS — D630 Anemia in neoplastic disease: Secondary | ICD-10-CM

## 2020-10-15 DIAGNOSIS — Z79899 Other long term (current) drug therapy: Secondary | ICD-10-CM | POA: Diagnosis not present

## 2020-10-15 DIAGNOSIS — D539 Nutritional anemia, unspecified: Secondary | ICD-10-CM

## 2020-10-15 DIAGNOSIS — K760 Fatty (change of) liver, not elsewhere classified: Secondary | ICD-10-CM

## 2020-10-15 DIAGNOSIS — Z7189 Other specified counseling: Secondary | ICD-10-CM

## 2020-10-15 LAB — HEPATITIS B SURFACE ANTIBODY,QUALITATIVE: Hep B S Ab: NONREACTIVE

## 2020-10-15 LAB — COMPREHENSIVE METABOLIC PANEL
ALT: 15 U/L (ref 0–44)
AST: 17 U/L (ref 15–41)
Albumin: 3.4 g/dL — ABNORMAL LOW (ref 3.5–5.0)
Alkaline Phosphatase: 90 U/L (ref 38–126)
Anion gap: 8 (ref 5–15)
BUN: 12 mg/dL (ref 6–20)
CO2: 26 mmol/L (ref 22–32)
Calcium: 9.1 mg/dL (ref 8.9–10.3)
Chloride: 106 mmol/L (ref 98–111)
Creatinine, Ser: 0.8 mg/dL (ref 0.44–1.00)
GFR, Estimated: >60 mL/min (ref >60)
Glucose, Bld: 206 mg/dL — ABNORMAL HIGH (ref 70–99)
Potassium: 4.1 mmol/L (ref 3.5–5.1)
Sodium: 140 mmol/L (ref 135–145)
Total Bilirubin: 0.7 mg/dL (ref 0.3–1.2)
Total Protein: 6.5 g/dL (ref 6.5–8.1)

## 2020-10-15 LAB — CBC WITH DIFFERENTIAL/PLATELET
Abs Immature Granulocytes: 0.23 10*3/uL — ABNORMAL HIGH (ref 0.00–0.07)
Basophils Absolute: 0.1 10*3/uL (ref 0.0–0.1)
Basophils Relative: 0 %
Eosinophils Absolute: 0.2 10*3/uL (ref 0.0–0.5)
Eosinophils Relative: 0 %
HCT: 30.3 % — ABNORMAL LOW (ref 36.0–46.0)
Hemoglobin: 8.7 g/dL — ABNORMAL LOW (ref 12.0–15.0)
Immature Granulocytes: 0 %
Lymphocytes Relative: 90 %
Lymphs Abs: 48.1 10*3/uL — ABNORMAL HIGH (ref 0.7–4.0)
MCH: 21.1 pg — ABNORMAL LOW (ref 26.0–34.0)
MCHC: 28.7 g/dL — ABNORMAL LOW (ref 30.0–36.0)
MCV: 73.4 fL — ABNORMAL LOW (ref 80.0–100.0)
Monocytes Absolute: 0.9 10*3/uL (ref 0.1–1.0)
Monocytes Relative: 2 %
Neutro Abs: 4 10*3/uL (ref 1.7–7.7)
Neutrophils Relative %: 8 %
Platelets: 161 10*3/uL (ref 150–400)
RBC: 4.13 MIL/uL (ref 3.87–5.11)
RDW: 17.1 % — ABNORMAL HIGH (ref 11.5–15.5)
WBC: 53.5 10*3/uL (ref 4.0–10.5)
nRBC: 0 % (ref 0.0–0.2)

## 2020-10-15 LAB — IRON AND TIBC
Iron: 37 ug/dL — ABNORMAL LOW (ref 41–142)
Saturation Ratios: 12 % — ABNORMAL LOW (ref 21–57)
TIBC: 316 ug/dL (ref 236–444)
UIBC: 279 ug/dL (ref 120–384)

## 2020-10-15 LAB — URIC ACID: Uric Acid, Serum: 4.9 mg/dL (ref 2.5–7.1)

## 2020-10-15 LAB — VITAMIN B12: Vitamin B-12: 260 pg/mL (ref 180–914)

## 2020-10-15 LAB — FERRITIN: Ferritin: 121 ng/mL (ref 11–307)

## 2020-10-15 LAB — HEPATITIS C ANTIBODY: HCV Ab: NONREACTIVE

## 2020-10-15 LAB — HEPATITIS B CORE ANTIBODY, IGM: Hep B C IgM: NONREACTIVE

## 2020-10-15 MED ORDER — SODIUM CHLORIDE 0.9% FLUSH
10.0000 mL | INTRAVENOUS | Status: DC | PRN
  Filled 2020-10-15: qty 10

## 2020-10-15 MED ORDER — HEPARIN SOD (PORK) LOCK FLUSH 100 UNIT/ML IV SOLN
500.0000 [IU] | Freq: Once | INTRAVENOUS | Status: DC | PRN
  Filled 2020-10-15: qty 5

## 2020-10-15 MED ORDER — SODIUM CHLORIDE 0.9 % IV SOLN: 5.0000 mg | Freq: Once | INTRAVENOUS | Status: DC

## 2020-10-15 MED ORDER — SODIUM CHLORIDE 0.9 % IV SOLN
60.0000 mg/m2 | Freq: Once | INTRAVENOUS | Status: AC
  Administered 2020-10-15: 150 mg via INTRAVENOUS
  Filled 2020-10-15: qty 6

## 2020-10-15 MED ORDER — SODIUM CHLORIDE 0.9 % IV SOLN
Freq: Once | INTRAVENOUS | Status: AC
  Filled 2020-10-15: qty 250

## 2020-10-15 MED ORDER — DEXAMETHASONE SODIUM PHOSPHATE 10 MG/ML IJ SOLN
INTRAMUSCULAR | Status: AC
  Filled 2020-10-15: qty 1

## 2020-10-15 MED ORDER — DEXAMETHASONE SODIUM PHOSPHATE 10 MG/ML IJ SOLN
5.0000 mg | Freq: Once | INTRAMUSCULAR | Status: AC
  Administered 2020-10-15: 5 mg via INTRAVENOUS

## 2020-10-15 NOTE — Patient Instructions (Signed)
Bendamustine Injection What is this medicine? BENDAMUSTINE (BEN da MUS teen) is a chemotherapy drug. It is used to treat chronic lymphocytic leukemia and non-Hodgkin lymphoma. This medicine may be used for other purposes; ask your health care provider or pharmacist if you have questions. COMMON BRAND NAME(S): BELRAPZO, BENDEKA, Treanda What should I tell my health care provider before I take this medicine? They need to know if you have any of these conditions:  infection (especially a virus infection such as chickenpox, cold sores, or herpes)  kidney disease  liver disease  an unusual or allergic reaction to bendamustine, mannitol, other medicines, foods, dyes, or preservatives  pregnant or trying to get pregnant  breast-feeding How should I use this medicine? This medicine is for infusion into a vein. It is given by a health care professional in a hospital or clinic setting. Talk to your pediatrician regarding the use of this medicine in children. Special care may be needed. Overdosage: If you think you have taken too much of this medicine contact a poison control center or emergency room at once. NOTE: This medicine is only for you. Do not share this medicine with others. What if I miss a dose? It is important not to miss your dose. Call your doctor or health care professional if you are unable to keep an appointment. What may interact with this medicine? Do not take this medicine with any of the following medications:  clozapine This medicine may also interact with the following medications:  atazanavir  cimetidine  ciprofloxacin  enoxacin  fluvoxamine  medicines for seizures like carbamazepine and phenobarbital  mexiletine  rifampin  tacrine  thiabendazole  zileuton This list may not describe all possible interactions. Give your health care provider a list of all the medicines, herbs, non-prescription drugs, or dietary supplements you use. Also tell them if  you smoke, drink alcohol, or use illegal drugs. Some items may interact with your medicine. What should I watch for while using this medicine? This drug may make you feel generally unwell. This is not uncommon, as chemotherapy can affect healthy cells as well as cancer cells. Report any side effects. Continue your course of treatment even though you feel ill unless your doctor tells you to stop. You may need blood work done while you are taking this medicine. Call your doctor or healthcare provider for advice if you get a fever, chills or sore throat, or other symptoms of a cold or flu. Do not treat yourself. This drug decreases your body's ability to fight infections. Try to avoid being around people who are sick. This medicine may cause serious skin reactions. They can happen weeks to months after starting the medicine. Contact your healthcare provider right away if you notice fevers or flu-like symptoms with a rash. The rash may be red or purple and then turn into blisters or peeling of the skin. Or, you might notice a red rash with swelling of the face, lips or lymph nodes in your neck or under your arms. This medicine may increase your risk to bruise or bleed. Call your doctor or healthcare provider if you notice any unusual bleeding. Talk to your doctor about your risk of cancer. You may be more at risk for certain types of cancers if you take this medicine. Do not become pregnant while taking this medicine or for at least 6 months after stopping it. Women should inform their doctor if they wish to become pregnant or think they might be pregnant. Men should not   father a child while taking this medicine and for at least 3 months after stopping it. There is a potential for serious side effects to an unborn child. Talk to your healthcare provider or pharmacist for more information. Do not breast-feed an infant while taking this medicine or for at least 1 week after stopping it. This medicine may make it  more difficult to father a child. You should talk with your doctor or healthcare provider if you are concerned about your fertility. What side effects may I notice from receiving this medicine? Side effects that you should report to your doctor or health care professional as soon as possible:  allergic reactions like skin rash, itching or hives, swelling of the face, lips, or tongue  low blood counts - this medicine may decrease the number of white blood cells, red blood cells and platelets. You may be at increased risk for infections and bleeding.  rash, fever, and swollen lymph nodes  redness, blistering, peeling, or loosening of the skin, including inside the mouth  signs of infection like fever or chills, cough, sore throat, pain or difficulty passing urine  signs of decreased platelets or bleeding like bruising, pinpoint red spots on the skin, black, tarry stools, blood in the urine  signs of decreased red blood cells like being unusually weak or tired, fainting spells, lightheadedness  signs and symptoms of kidney injury like trouble passing urine or change in the amount of urine  signs and symptoms of liver injury like dark yellow or brown urine; general ill feeling or flu-like symptoms; light-colored stools; loss of appetite; nausea; right upper belly pain; unusually weak or tired; yellowing of the eyes or skin Side effects that usually do not require medical attention (report to your doctor or health care professional if they continue or are bothersome):  constipation  decreased appetite  diarrhea  headache  mouth sores  nausea, vomiting  tiredness This list may not describe all possible side effects. Call your doctor for medical advice about side effects. You may report side effects to FDA at 1-800-FDA-1088. Where should I keep my medicine? This drug is given in a hospital or clinic and will not be stored at home. NOTE: This sheet is a summary. It may not cover all  possible information. If you have questions about this medicine, talk to your doctor, pharmacist, or health care provider.  2020 [employer]/Gold Standard (2018-12-21 10:26:46)  

## 2020-10-15 NOTE — Telephone Encounter (Signed)
[facility], Ebony Hail, RPh is wanting to clarify patient taking Acyclovir and Valcyclovir . Does one need to be cancelled?

## 2020-10-16 ENCOUNTER — Telehealth: Payer: Self-pay | Admitting: Hematology and Oncology

## 2020-10-16 ENCOUNTER — Inpatient Hospital Stay: Payer: No Typology Code available for payment source

## 2020-10-16 ENCOUNTER — Other Ambulatory Visit: Payer: Self-pay

## 2020-10-16 ENCOUNTER — Inpatient Hospital Stay (HOSPITAL_BASED_OUTPATIENT_CLINIC_OR_DEPARTMENT_OTHER): Payer: No Typology Code available for payment source | Admitting: Hematology and Oncology

## 2020-10-16 VITALS — BP 116/74 | HR 90 | Temp 98.3°F | Resp 18

## 2020-10-16 VITALS — BP 107/66 | HR 94 | Resp 18 | Ht 67.0 in | Wt 246.8 lb

## 2020-10-16 DIAGNOSIS — C8307 Small cell B-cell lymphoma, spleen: Secondary | ICD-10-CM

## 2020-10-16 DIAGNOSIS — Z7189 Other specified counseling: Secondary | ICD-10-CM

## 2020-10-16 DIAGNOSIS — Z5111 Encounter for antineoplastic chemotherapy: Secondary | ICD-10-CM | POA: Diagnosis not present

## 2020-10-16 DIAGNOSIS — D630 Anemia in neoplastic disease: Secondary | ICD-10-CM | POA: Diagnosis not present

## 2020-10-16 DIAGNOSIS — K5909 Other constipation: Secondary | ICD-10-CM

## 2020-10-16 MED ORDER — DEXAMETHASONE SODIUM PHOSPHATE 10 MG/ML IJ SOLN
5.0000 mg | Freq: Once | INTRAMUSCULAR | Status: AC
Start: 2020-10-16 — End: 2020-10-16
  Administered 2020-10-16: 5 mg via INTRAVENOUS

## 2020-10-16 MED ORDER — SODIUM CHLORIDE 0.9 % IV SOLN: 5.0000 mg | Freq: Once | INTRAVENOUS | Status: DC

## 2020-10-16 MED ORDER — SODIUM CHLORIDE 0.9 % IV SOLN
Freq: Once | INTRAVENOUS | Status: AC
  Filled 2020-10-16: qty 250

## 2020-10-16 MED ORDER — DEXAMETHASONE SODIUM PHOSPHATE 10 MG/ML IJ SOLN
INTRAMUSCULAR | Status: AC
  Filled 2020-10-16: qty 1

## 2020-10-16 MED ORDER — SODIUM CHLORIDE 0.9 % IV SOLN
60.0000 mg/m2 | Freq: Once | INTRAVENOUS | Status: AC
  Administered 2020-10-16: 150 mg via INTRAVENOUS
  Filled 2020-10-16: qty 6

## 2020-10-16 NOTE — Telephone Encounter (Signed)
Scheduled appointments per 1/4 sch msg. Spoke to patient who is aware of appointments date and times. Gave patient calendar print out.  

## 2020-10-16 NOTE — Telephone Encounter (Signed)
I see her today and will take caare of it

## 2020-10-16 NOTE — Patient Instructions (Signed)
[facility] Discharge Instructions for Patients Receiving Chemotherapy  Today you received the following chemotherapy agents: bendamustine.  To help prevent nausea and vomiting after your treatment, we encourage you to take your nausea medication as directed.   If you develop nausea and vomiting that is not controlled by your nausea medication, call the clinic.   BELOW ARE SYMPTOMS THAT SHOULD BE REPORTED IMMEDIATELY:  *FEVER GREATER THAN 100.5 F  *CHILLS WITH OR WITHOUT FEVER  NAUSEA AND VOMITING THAT IS NOT CONTROLLED WITH YOUR NAUSEA MEDICATION  *UNUSUAL SHORTNESS OF BREATH  *UNUSUAL BRUISING OR BLEEDING  TENDERNESS IN MOUTH AND THROAT WITH OR WITHOUT PRESENCE OF ULCERS  *URINARY PROBLEMS  *BOWEL PROBLEMS  UNUSUAL RASH Items with * indicate a potential emergency and should be followed up as soon as possible.  Feel free to call the clinic should you have any questions or concerns. The clinic phone number is [phone].  Please show the CHEMO ALERT CARD at check-in to the Emergency Department and triage nurse.   

## 2020-10-17 ENCOUNTER — Encounter: Payer: Self-pay | Admitting: Hematology and Oncology

## 2020-10-17 DIAGNOSIS — K5909 Other constipation: Secondary | ICD-10-CM | POA: Insufficient documentation

## 2020-10-17 NOTE — Assessment & Plan Note (Signed)
This is related to treatment I am concerned she might need blood transfusion support I plan to see her back within a week and a half for further follow-up and repeat blood work and transfuse as needed

## 2020-10-17 NOTE — Assessment & Plan Note (Signed)
Overall, she tolerated treatment well without major side effects except for constipation and anemia She will return again in about week and a half for me to repeat her blood and make sure she does not need transfusion support We will proceed with treatment without delay

## 2020-10-17 NOTE — Assessment & Plan Note (Signed)
We have extensive discussions about the importance of laxative therapy. 

## 2020-10-17 NOTE — Progress Notes (Signed)
[facility] OFFICE PROGRESS NOTE  Patient Care Team: Lennie Odor, [place] as PCP - General (Nurse Practitioner) Heath Lark, MD as Consulting Physician (Hematology and Oncology)  ASSESSMENT & PLAN:  Splenic marginal zone b-cell lymphoma ([place]) Overall, she tolerated treatment well without major side effects except for constipation and anemia She will return again in about week and a half for me to repeat her blood and make sure she does not need transfusion support We will proceed with treatment without delay  Anemia in neoplastic disease This is related to treatment I am concerned she might need blood transfusion support I plan to see her back within a week and a half for further follow-up and repeat blood work and transfuse as needed  Other constipation We have extensive discussions about the importance of laxative therapy   Orders Placed This Encounter  Procedures  . Sample to Blood Bank    Standing Status:   Standing    Number of Occurrences:   33    Standing Expiration Date:   10/16/2021    All questions were answered. The patient knows to call the clinic with any problems, questions or concerns. The total time spent in the appointment was 20 minutes encounter with patients including review of chart and various tests results, discussions about plan of care and coordination of care plan   Heath Lark, MD 10/17/2020 3:29 PM  INTERVAL HISTORY: Please see below for problem oriented charting. She returns for further follow-up She tolerated cycle 1 of treatment well She struggled with some constipation No recent nausea The patient denies any recent signs or symptoms of bleeding such as spontaneous epistaxis, hematuria or hematochezia.   SUMMARY OF ONCOLOGIC HISTORY: Oncology History  Splenic marginal zone b-cell lymphoma ([place])  01/11/2009 Initial Diagnosis   Splenic marginal zone b-cell lymphoma   06/06/2014 Bone Marrow Biopsy   Bone marrow aspirate and biopsy  confirmed extensive involvement by CD20 positive non-Hodgkin's lymphoma.   06/07/2014 Imaging   She had a PET scan which showed predominant splenic involvement.   06/16/2014 - 07/07/2014 Chemotherapy   She started on weekly rituximab.   06/16/2014 Adverse Reaction   She had infusion reaction, resolved with IV dexamethasone.   08/18/2014 Imaging   PET CT scan show resolution of hypermetabolic activity.   04/08/2016 Imaging   1. No evidence for aortic dissection or vascular injury. 2. Mild atherosclerotic changes within the abdominal aorta and branch vessels without aneurysm. 3. No significant adenopathy or evidence for recurrent lymphoma. 4. Stable mild splenomegaly. 5. 5 mm nodule in the right lower lobe demonstrates interval increase in size. No follow-up needed if patient is low-risk. Non-contrast chest CT can be considered in 12 months if patient is high-risk.  6. Degenerate changes at the SI joints bilaterally.   01/21/2018 Imaging   1. Persistent mild splenomegaly, similar to prior examinations. No other findings to suggest recurrent disease in the chest, abdomen or pelvis. 2. Aortic atherosclerosis, in addition to two vessel coronary artery disease. Please note that although the presence of coronary artery calcium documents the presence of coronary artery disease, the severity of this disease and any potential stenosis cannot be assessed on this non-gated CT examination. Assessment for potential risk factor modification, dietary therapy or pharmacologic therapy may be warranted, if clinically indicated.  3. Enlarging heterogeneously enhancing and partially calcified left thyroid lobe nodule. Further evaluation with nonemergent thyroid ultrasound is recommended in the near future to better evaluate this lesion and determine if there is  a need for fine-needle aspiration. 4. Additional incidental findings, as above.   09/28/2020 Imaging   1. Progressive splenomegaly. The splenic volume has  almost doubled since 2019. 2. No adenopathy in the chest, abdomen or pelvis. 3. Stable fibroid uterus. 4. Moderate stool throughout the colon and down into the rectum suggesting constipation.   Aortic Atherosclerosis (ICD10-I70.0).   09/28/2020 Cancer Staging   Staging form: Hodgkin and Non-Hodgkin Lymphoma, AJCC 8th Edition - Clinical stage from 09/28/2020: Stage IE (Marginal zone lymphoma) - Signed by Heath Lark, MD on 09/28/2020   10/15/2020 -  Chemotherapy   The patient had palonosetron (ALOXI) injection 0.25 mg, 0.25 mg, Intravenous,  Once, 0 of 3 cycles bendamustine (BENDEKA) 150 mg in sodium chloride 0.9 % 50 mL (2.6786 mg/mL) chemo infusion, 60 mg/m2 = 150 mg (66.7 % of original dose 90 mg/m2), Intravenous,  Once, 1 of 4 cycles Dose modification: 60 mg/m2 (66.7 % of original dose 90 mg/m2, Cycle 1, Reason: Dose Not Tolerated) Administration: 150 mg (10/15/2020), 150 mg (10/16/2020) riTUXimab-pvvr (RUXIENCE) 900 mg in sodium chloride 0.9 % 250 mL (2.6471 mg/mL) infusion, 375 mg/m2, Intravenous,  Once, 0 of 3 cycles  for chemotherapy treatment.      REVIEW OF SYSTEMS:   Constitutional: Denies fevers, chills or abnormal weight loss Eyes: Denies blurriness of vision Ears, nose, mouth, throat, and face: Denies mucositis or sore throat Respiratory: Denies cough, dyspnea or wheezes Cardiovascular: Denies palpitation, chest discomfort or lower extremity swelling Skin: Denies abnormal skin rashes Lymphatics: Denies new lymphadenopathy or easy bruising Neurological:Denies numbness, tingling or new weaknesses Behavioral/Psych: Mood is stable, no new changes  All other systems were reviewed with the patient and are negative.  I have reviewed the past medical history, past surgical history, social history and family history with the patient and they are unchanged from previous note.  ALLERGIES:  is allergic to erythromycin, rituxan [rituximab], lovastatin, and tolnaftate.  MEDICATIONS:   Current Outpatient Medications  Medication Sig Dispense Refill  . acetaminophen (TYLENOL) 500 MG tablet Take 1 tablet (500 mg total) by mouth every 8 (eight) hours as needed (pain). 30 tablet 0  . acyclovir (ZOVIRAX) 400 MG tablet Take 1 tablet (400 mg total) by mouth daily. 30 tablet 3  . allopurinol (ZYLOPRIM) 300 MG tablet Take 1 tablet (300 mg total) by mouth daily. 30 tablet 3  . aspirin 81 MG chewable tablet Chew 1 tablet (81 mg total) by mouth daily. 30 tablet 0  . Bempedoic Acid (NEXLETOL) 180 MG TABS Take 1 tablet by mouth daily.    . cephALEXin (KEFLEX) 500 MG capsule Take 1 capsule (500 mg total) by mouth 3 (three) times daily. 30 capsule 0  . fish oil-omega-3 fatty acids 1000 MG capsule Take 1 g by mouth 2 (two) times daily.     . furosemide (LASIX) 40 MG tablet Take 40 mg by mouth as needed.    . hydrOXYzine (ATARAX/VISTARIL) 25 MG tablet Take 25 mg by mouth 3 (three) times daily.    Marland Kitchen losartan (COZAAR) 25 MG tablet Take 25 mg by mouth daily.    . metFORMIN (GLUCOPHAGE) 1000 MG tablet Take 1,000 mg by mouth 2 (two) times daily with a meal.      . metoprolol tartrate (LOPRESSOR) 100 MG tablet Take 1 tablet (100 mg total) by mouth 2 (two) times daily. PT OVERDUE FOR OV PLEASE CALL FOR APPT 60 tablet 0  . nitroGLYCERIN (NITROSTAT) 0.4 MG SL tablet Place 1 tablet (0.4 mg total) under the  tongue every 5 (five) minutes as needed for chest pain. 30 tablet 0  . ondansetron (ZOFRAN) 8 MG tablet Take 1 tablet (8 mg total) by mouth every 8 (eight) hours as needed for refractory nausea / vomiting. Start on day 2 after bendamustine chemo. 30 tablet 1  . prochlorperazine (COMPAZINE) 10 MG tablet Take 1 tablet (10 mg total) by mouth every 6 (six) hours as needed (Nausea or vomiting). 30 tablet 1  . TRULICITY 4.5 QM/0.8QP SOPN Inject 4.5 mg into the skin once a week.    . valACYclovir (VALTREX) 500 MG tablet Take 500 mg by mouth 2 (two) times daily as needed. Takes for outbreaks only.     No  current facility-administered medications for this visit.    PHYSICAL EXAMINATION: ECOG PERFORMANCE STATUS: 1 - Symptomatic but completely ambulatory  Vitals:   10/16/20 0806  BP: 107/66  Pulse: 94  Resp: 18  SpO2: 100%   Filed Weights   10/16/20 0806  Weight: 246 lb 12.8 oz (111.9 kg)    GENERAL:alert, no distress and comfortable Musculoskeletal:no cyanosis of digits and no clubbing  NEURO: alert & oriented x 3 with fluent speech, no focal motor/sensory deficits  LABORATORY DATA:  I have reviewed the data as listed    Component Value Date/Time   NA 140 10/15/2020 0805   NA 138 07/24/2017 1105   K 4.1 10/15/2020 0805   K 4.2 07/24/2017 1105   CL 106 10/15/2020 0805   CL 106 11/19/2012 0837   CO2 26 10/15/2020 0805   CO2 26 07/24/2017 1105   GLUCOSE 206 (H) 10/15/2020 0805   GLUCOSE 362 (H) 07/24/2017 1105   GLUCOSE 107 (H) 11/19/2012 0837   BUN 12 10/15/2020 0805   BUN 10.0 07/24/2017 1105   CREATININE 0.80 10/15/2020 0805   CREATININE 0.91 10/22/2018 0809   CREATININE 0.8 07/24/2017 1105   CALCIUM 9.1 10/15/2020 0805   CALCIUM 8.8 07/24/2017 1105   PROT 6.5 10/15/2020 0805   PROT 6.3 (L) 07/24/2017 1105   ALBUMIN 3.4 (L) 10/15/2020 0805   ALBUMIN 3.4 (L) 07/24/2017 1105   AST 17 10/15/2020 0805   AST 11 (L) 10/22/2018 0809   AST 13 07/24/2017 1105   ALT 15 10/15/2020 0805   ALT 16 10/22/2018 0809   ALT 21 07/24/2017 1105   ALKPHOS 90 10/15/2020 0805   ALKPHOS 112 07/24/2017 1105   BILITOT 0.7 10/15/2020 0805   BILITOT 0.7 10/22/2018 0809   BILITOT 0.55 07/24/2017 1105   GFRNONAA >60 10/15/2020 0805   GFRNONAA >60 10/22/2018 0809   GFRAA >60 05/18/2020 0825   GFRAA >60 10/22/2018 0809    No results found for: SPEP, UPEP  Lab Results  Component Value Date   WBC 53.5 (HH) 10/15/2020   NEUTROABS 4.0 10/15/2020   HGB 8.7 (L) 10/15/2020   HCT 30.3 (L) 10/15/2020   MCV 73.4 (L) 10/15/2020   PLT 161 10/15/2020      Chemistry      Component Value  Date/Time   NA 140 10/15/2020 0805   NA 138 07/24/2017 1105   K 4.1 10/15/2020 0805   K 4.2 07/24/2017 1105   CL 106 10/15/2020 0805   CL 106 11/19/2012 0837   CO2 26 10/15/2020 0805   CO2 26 07/24/2017 1105   BUN 12 10/15/2020 0805   BUN 10.0 07/24/2017 1105   CREATININE 0.80 10/15/2020 0805   CREATININE 0.91 10/22/2018 0809   CREATININE 0.8 07/24/2017 1105      Component Value  Date/Time   CALCIUM 9.1 10/15/2020 0805   CALCIUM 8.8 07/24/2017 1105   ALKPHOS 90 10/15/2020 0805   ALKPHOS 112 07/24/2017 1105   AST 17 10/15/2020 0805   AST 11 (L) 10/22/2018 0809   AST 13 07/24/2017 1105   ALT 15 10/15/2020 0805   ALT 16 10/22/2018 0809   ALT 21 07/24/2017 1105   BILITOT 0.7 10/15/2020 0805   BILITOT 0.7 10/22/2018 0809   BILITOT 0.55 07/24/2017 1105       RADIOGRAPHIC STUDIES: I have personally reviewed the radiological images as listed and agreed with the findings in the report. CT CHEST ABDOMEN PELVIS W CONTRAST  Result Date: 09/28/2020 CLINICAL DATA:  Non-Hodgkin's lymphoma.  Restaging. EXAM: CT CHEST, ABDOMEN, AND PELVIS WITH CONTRAST TECHNIQUE: Multidetector CT imaging of the chest, abdomen and pelvis was performed following the standard protocol during bolus administration of intravenous contrast. CONTRAST:  19m OMNIPAQUE IOHEXOL 300 MG/ML  SOLN COMPARISON:  CT scans 01/20/2018 FINDINGS: CT CHEST FINDINGS Cardiovascular: The heart is normal in size. No pericardial effusion. Stable mild tortuosity of the thoracic aorta and stable calcifications at the aortic arch. Stable three-vessel coronary artery calcifications and I suspect the patient has coronary artery stents in place. The pulmonary arteries are unremarkable. Mediastinum/Nodes: No mediastinal or hilar mass or lymphadenopathy. The esophagus is grossly normal. Stable appearing large left thyroid nodules previously evaluated with ultrasound and biopsy. This has been evaluated on previous imaging. (ref: J Am Coll Radiol.  2015 Feb;12(2): 143-50). Lungs/Pleura: No acute pulmonary findings or worrisome pulmonary lesions. No pleural effusion or pleural lesions. Minimal areas of subpleural dependent atelectasis. Musculoskeletal: No breast masses, supraclavicular or axillary lymphadenopathy. Small scattered lymph nodes are stable. The bony structures are unremarkable. CT ABDOMEN PELVIS FINDINGS Hepatobiliary: No hepatic lesions or intrahepatic biliary dilatation. The gallbladder is normal. No common bile duct dilatation. Pancreas: No mass, inflammation or ductal dilatation. Spleen: Splenomegaly persists and is progressive. The spleen measures 18 x 16.5 x 12 cm (splenic volume 1,752 cubic cm). The spleen previously measured 15 x 12 x 10 cm (splenic volume 900 cc). No focal splenic lesions. Adrenals/Urinary Tract: The adrenal glands and kidneys are unremarkable. The bladder is normal. Stomach/Bowel: The stomach, duodenum, small bowel and colon are unremarkable. No acute inflammatory changes, mass lesions or obstructive findings. The terminal ileum is normal. The appendix is normal. Moderate stool throughout the colon and down into the rectum suggesting constipation. No colonic mass or obstruction. Vascular/Lymphatic: The aorta and branch vessels are patent. Scattered atherosclerotic calcifications. The major venous structures are patent. Small scattered mesenteric and retroperitoneal lymph nodes. No mass or overt adenopathy. No pelvic adenopathy. No inguinal adenopathy. Reproductive: Stable fibroid uterus.  The ovaries are unremarkable. Other: No pelvic mass or adenopathy. No free pelvic fluid collections. No inguinal mass or adenopathy. No abdominal wall hernia or subcutaneous lesions. Musculoskeletal: No significant bony findings. IMPRESSION: 1. Progressive splenomegaly. The splenic volume has almost doubled since 2019. 2. No adenopathy in the chest, abdomen or pelvis. 3. Stable fibroid uterus. 4. Moderate stool throughout the colon and  down into the rectum suggesting constipation. Aortic Atherosclerosis (ICD10-I70.0). Electronically Signed   By: PMarijo SanesM.D.   On: 09/28/2020 12:59

## 2020-10-18 ENCOUNTER — Telehealth: Payer: Self-pay

## 2020-10-18 NOTE — Telephone Encounter (Signed)
Called and left a message asking her to call the office back. 

## 2020-10-18 NOTE — Telephone Encounter (Signed)
She called back. Constipation is better. She had bm on Tuesday and started Miralax yesterday. Instructed to call the office back for questions/concerns. She verbalized understanding.

## 2020-10-18 NOTE — Telephone Encounter (Signed)
-----   Message from Artis Delay, MD sent at 10/18/2020  7:58 AM EST ----- Regarding: constipation Can you call and ask how she is doing in regards to constipation?

## 2020-10-26 ENCOUNTER — Telehealth: Payer: Self-pay

## 2020-10-26 NOTE — Telephone Encounter (Signed)
Called and told 1/17 appts moved to 1/25 due to weather forecast. Reviewed 1/25 appts. She verbalized understanding.

## 2020-10-29 ENCOUNTER — Inpatient Hospital Stay: Payer: No Typology Code available for payment source

## 2020-10-29 ENCOUNTER — Inpatient Hospital Stay: Payer: No Typology Code available for payment source | Admitting: Hematology and Oncology

## 2020-11-05 ENCOUNTER — Other Ambulatory Visit (HOSPITAL_COMMUNITY): Payer: Self-pay | Admitting: Adult Health

## 2020-11-05 ENCOUNTER — Other Ambulatory Visit: Payer: Self-pay

## 2020-11-05 ENCOUNTER — Telehealth: Payer: Self-pay

## 2020-11-05 DIAGNOSIS — D630 Anemia in neoplastic disease: Secondary | ICD-10-CM

## 2020-11-05 DIAGNOSIS — U071 COVID-19: Secondary | ICD-10-CM

## 2020-11-05 DIAGNOSIS — E1165 Type 2 diabetes mellitus with hyperglycemia: Secondary | ICD-10-CM

## 2020-11-05 DIAGNOSIS — C8307 Small cell B-cell lymphoma, spleen: Secondary | ICD-10-CM

## 2020-11-05 NOTE — Telephone Encounter (Signed)
Called back. Date of onset of symptoms 1/19 and tested positive 1/22. 2nd covid vaccine 08/11/20. Referral to mab infusion.

## 2020-11-05 NOTE — Telephone Encounter (Signed)
With her recent chemo, she should be referred for possible monoclonal infusion Can you give her number to call?

## 2020-11-05 NOTE — Telephone Encounter (Signed)
Called to see how she is doing. She called the scheduler to cancel tomorrow appts. She denies chest pain, dizziness and shortness of breath. She does not think she needs a blood transfusion. She went to urgent care and tested positive for covid on 1/22. She thought she had a sinus infection. Urgent care started her on antibiotic, vitamin C, vitamin D, Zinc and 325 mg ASA. Told her the office would reschedule in 21 days from when she tested positive.

## 2020-11-05 NOTE — Telephone Encounter (Signed)
Called and left a message asking her to call the office back. 

## 2020-11-06 ENCOUNTER — Other Ambulatory Visit: Payer: Self-pay | Admitting: Physician Assistant

## 2020-11-06 ENCOUNTER — Ambulatory Visit (HOSPITAL_COMMUNITY)
Admission: RE | Admit: 2020-11-06 | Discharge: 2020-11-06 | Disposition: A | Discharge status: Home / Self Care | Payer: No Typology Code available for payment source | Source: Ambulatory Visit | Attending: Pulmonary Disease | Admitting: Pulmonary Disease

## 2020-11-06 ENCOUNTER — Inpatient Hospital Stay: Payer: No Typology Code available for payment source

## 2020-11-06 ENCOUNTER — Inpatient Hospital Stay: Payer: No Typology Code available for payment source | Admitting: Hematology and Oncology

## 2020-11-06 DIAGNOSIS — I2510 Atherosclerotic heart disease of native coronary artery without angina pectoris: Secondary | ICD-10-CM

## 2020-11-06 DIAGNOSIS — U071 COVID-19: Secondary | ICD-10-CM | POA: Insufficient documentation

## 2020-11-06 DIAGNOSIS — I10 Essential (primary) hypertension: Secondary | ICD-10-CM

## 2020-11-06 DIAGNOSIS — E119 Type 2 diabetes mellitus without complications: Secondary | ICD-10-CM | POA: Insufficient documentation

## 2020-11-06 DIAGNOSIS — Z6825 Body mass index (BMI) 25.0-25.9, adult: Secondary | ICD-10-CM | POA: Diagnosis not present

## 2020-11-06 DIAGNOSIS — C8307 Small cell B-cell lymphoma, spleen: Secondary | ICD-10-CM

## 2020-11-06 DIAGNOSIS — D849 Immunodeficiency, unspecified: Secondary | ICD-10-CM | POA: Insufficient documentation

## 2020-11-06 MED ORDER — ALBUTEROL SULFATE HFA 108 (90 BASE) MCG/ACT IN AERS: 2.0000 | INHALATION_SPRAY | Freq: Once | RESPIRATORY_TRACT | Status: DC | PRN

## 2020-11-06 MED ORDER — DIPHENHYDRAMINE HCL 50 MG/ML IJ SOLN: 50.0000 mg | Freq: Once | INTRAMUSCULAR | Status: DC | PRN

## 2020-11-06 MED ORDER — SODIUM CHLORIDE 0.9 % IV SOLN: INTRAVENOUS | Status: DC | PRN

## 2020-11-06 MED ORDER — METHYLPREDNISOLONE SODIUM SUCC 125 MG IJ SOLR: 125.0000 mg | Freq: Once | INTRAMUSCULAR | Status: DC | PRN

## 2020-11-06 MED ORDER — FAMOTIDINE IN NACL 20-0.9 MG/50ML-% IV SOLN: 20.0000 mg | Freq: Once | INTRAVENOUS | Status: DC | PRN

## 2020-11-06 MED ORDER — SOTROVIMAB 500 MG/8ML IV SOLN
500.0000 mg | Freq: Once | INTRAVENOUS | Status: AC
  Administered 2020-11-06: 500 mg via INTRAVENOUS

## 2020-11-06 MED ORDER — EPINEPHRINE 0.3 MG/0.3ML IJ SOAJ: 0.3000 mg | Freq: Once | INTRAMUSCULAR | Status: DC | PRN

## 2020-11-06 NOTE — Progress Notes (Signed)
I connected by phone with Desiree [name] on 11/06/2020 at 9:14 AM to discuss the potential use of a new treatment for mild to moderate COVID-19 viral infection in non-hospitalized patients.  This patient is a 56 y.o. female that meets the FDA criteria for Emergency Use Authorization of COVID monoclonal antibody sotrovimab.  Has a (+) direct SARS-CoV-2 viral test result  Has mild or moderate COVID-19   Is NOT hospitalized due to COVID-19  Is within 10 days of symptom onset  Has at least one of the high risk factor(s) for progression to severe COVID-19 and/or hospitalization as defined in EUA.  Specific high risk criteria : BMI > 25, Diabetes, Immunosuppressive Disease or Treatment and Cardiovascular disease or hypertension   I have spoken and communicated the following to the patient or parent/caregiver regarding COVID monoclonal antibody treatment:  1. FDA has authorized the emergency use for the treatment of mild to moderate COVID-19 in adults and pediatric patients with positive results of direct SARS-CoV-2 viral testing who are 81 years of age and older weighing at least 40 kg, and who are at high risk for progressing to severe COVID-19 and/or hospitalization.  2. The significant known and potential risks and benefits of COVID monoclonal antibody, and the extent to which such potential risks and benefits are unknown.  3. Information on available alternative treatments and the risks and benefits of those alternatives, including clinical trials.  4. Patients treated with COVID monoclonal antibody should continue to self-isolate and use infection control measures (e.g., wear mask, isolate, social distance, avoid sharing personal items, clean and disinfect "high touch" surfaces, and frequent handwashing) according to CDC guidelines.   5. The patient or parent/caregiver has the option to accept or refuse COVID monoclonal antibody treatment.  After reviewing this information with the  patient, the patient has agreed to receive one of the available covid 19 monoclonal antibodies and will be provided an appropriate fact sheet prior to infusion.   [place], [place] 11/06/2020 9:14 AM

## 2020-11-06 NOTE — Discharge Instructions (Signed)

## 2020-11-06 NOTE — Progress Notes (Signed)
Patient reviewed Fact Sheet for Patients, Parents, and Caregivers for Emergency Use Authorization (EUA) of sotrovimab for the Treatment of Coronavirus. Patient also reviewed and is agreeable to the estimated cost of treatment. Patient is agreeable to proceed.   

## 2020-11-06 NOTE — Progress Notes (Signed)
Diagnosis: COVID-19  Physician: Dr. Patrick Wright  Procedure: Covid Infusion Clinic Med: Sotrovimab infusion - Provided patient with sotrovimab fact sheet for patients, parents, and caregivers prior to infusion.   Complications: No immediate complications noted  Discharge: Discharged home    

## 2020-11-07 ENCOUNTER — Telehealth: Payer: Self-pay | Admitting: Hematology and Oncology

## 2020-11-07 NOTE — Telephone Encounter (Signed)
Scheduled appt per 1/25 sch msg - pt is aware of appt on 2/14

## 2020-11-08 ENCOUNTER — Other Ambulatory Visit: Payer: Self-pay | Admitting: Hematology and Oncology

## 2020-11-26 ENCOUNTER — Inpatient Hospital Stay: Payer: No Typology Code available for payment source | Attending: Hematology and Oncology

## 2020-11-26 ENCOUNTER — Other Ambulatory Visit: Payer: Self-pay | Admitting: Hematology and Oncology

## 2020-11-26 ENCOUNTER — Encounter: Payer: Self-pay | Admitting: Hematology and Oncology

## 2020-11-26 ENCOUNTER — Telehealth: Payer: Self-pay | Admitting: Hematology and Oncology

## 2020-11-26 ENCOUNTER — Inpatient Hospital Stay (HOSPITAL_BASED_OUTPATIENT_CLINIC_OR_DEPARTMENT_OTHER): Payer: No Typology Code available for payment source | Admitting: Hematology and Oncology

## 2020-11-26 ENCOUNTER — Inpatient Hospital Stay: Payer: No Typology Code available for payment source

## 2020-11-26 ENCOUNTER — Other Ambulatory Visit: Payer: Self-pay

## 2020-11-26 DIAGNOSIS — C8307 Small cell B-cell lymphoma, spleen: Secondary | ICD-10-CM | POA: Diagnosis present

## 2020-11-26 DIAGNOSIS — D500 Iron deficiency anemia secondary to blood loss (chronic): Secondary | ICD-10-CM

## 2020-11-26 DIAGNOSIS — D630 Anemia in neoplastic disease: Secondary | ICD-10-CM

## 2020-11-26 DIAGNOSIS — Z79899 Other long term (current) drug therapy: Secondary | ICD-10-CM | POA: Insufficient documentation

## 2020-11-26 DIAGNOSIS — Z7189 Other specified counseling: Secondary | ICD-10-CM

## 2020-11-26 DIAGNOSIS — Z5111 Encounter for antineoplastic chemotherapy: Secondary | ICD-10-CM | POA: Diagnosis not present

## 2020-11-26 DIAGNOSIS — L03115 Cellulitis of right lower limb: Secondary | ICD-10-CM

## 2020-11-26 DIAGNOSIS — E1165 Type 2 diabetes mellitus with hyperglycemia: Secondary | ICD-10-CM | POA: Diagnosis not present

## 2020-11-26 DIAGNOSIS — L0390 Cellulitis, unspecified: Secondary | ICD-10-CM | POA: Insufficient documentation

## 2020-11-26 LAB — CBC WITH DIFFERENTIAL (CANCER CENTER ONLY)
Abs Immature Granulocytes: 0 10*3/uL (ref 0.00–0.07)
Basophils Absolute: 0.1 10*3/uL (ref 0.0–0.1)
Basophils Relative: 1 %
Eosinophils Absolute: 0.3 10*3/uL (ref 0.0–0.5)
Eosinophils Relative: 6 %
HCT: 37.4 % (ref 36.0–46.0)
Hemoglobin: 11.9 g/dL — ABNORMAL LOW (ref 12.0–15.0)
Immature Granulocytes: 0 %
Lymphocytes Relative: 18 %
Lymphs Abs: 0.9 10*3/uL (ref 0.7–4.0)
MCH: 23.7 pg — ABNORMAL LOW (ref 26.0–34.0)
MCHC: 31.8 g/dL (ref 30.0–36.0)
MCV: 74.5 fL — ABNORMAL LOW (ref 80.0–100.0)
Monocytes Absolute: 0.4 10*3/uL (ref 0.1–1.0)
Monocytes Relative: 8 %
Neutro Abs: 3.3 10*3/uL (ref 1.7–7.7)
Neutrophils Relative %: 67 %
Platelet Count: 170 10*3/uL (ref 150–400)
RBC: 5.02 MIL/uL (ref 3.87–5.11)
RDW: 18.1 % — ABNORMAL HIGH (ref 11.5–15.5)
WBC Count: 4.9 10*3/uL (ref 4.0–10.5)
nRBC: 0 % (ref 0.0–0.2)

## 2020-11-26 LAB — SAMPLE TO BLOOD BANK

## 2020-11-26 LAB — CMP (CANCER CENTER ONLY)
ALT: 15 U/L (ref 0–44)
AST: 12 U/L — ABNORMAL LOW (ref 15–41)
Albumin: 3.6 g/dL (ref 3.5–5.0)
Alkaline Phosphatase: 114 U/L (ref 38–126)
Anion gap: 9 (ref 5–15)
BUN: 10 mg/dL (ref 6–20)
CO2: 25 mmol/L (ref 22–32)
Calcium: 8.5 mg/dL — ABNORMAL LOW (ref 8.9–10.3)
Chloride: 106 mmol/L (ref 98–111)
Creatinine: 0.76 mg/dL (ref 0.44–1.00)
GFR, Estimated: >60 mL/min (ref >60)
Glucose, Bld: 263 mg/dL — ABNORMAL HIGH (ref 70–99)
Potassium: 3.5 mmol/L (ref 3.5–5.1)
Sodium: 140 mmol/L (ref 135–145)
Total Bilirubin: 0.5 mg/dL (ref 0.3–1.2)
Total Protein: 6.2 g/dL — ABNORMAL LOW (ref 6.5–8.1)

## 2020-11-26 LAB — URIC ACID: Uric Acid, Serum: 5.3 mg/dL (ref 2.5–7.1)

## 2020-11-26 MED ORDER — INSULIN ASPART 100 UNIT/ML ~~LOC~~ SOLN
SUBCUTANEOUS | Status: AC
  Filled 2020-11-26: qty 1

## 2020-11-26 MED ORDER — SODIUM CHLORIDE 0.9 % IV SOLN
Freq: Once | INTRAVENOUS | Status: AC
  Filled 2020-11-26: qty 250

## 2020-11-26 MED ORDER — SODIUM CHLORIDE 0.9 % IV SOLN
60.0000 mg/m2 | Freq: Once | INTRAVENOUS | Status: AC
  Administered 2020-11-26: 150 mg via INTRAVENOUS
  Filled 2020-11-26: qty 6

## 2020-11-26 MED ORDER — PALONOSETRON HCL INJECTION 0.25 MG/5ML
INTRAVENOUS | Status: AC
  Filled 2020-11-26: qty 5

## 2020-11-26 MED ORDER — PALONOSETRON HCL INJECTION 0.25 MG/5ML
0.2500 mg | Freq: Once | INTRAVENOUS | Status: AC
  Administered 2020-11-26: 0.25 mg via INTRAVENOUS

## 2020-11-26 MED ORDER — CEPHALEXIN 500 MG PO CAPS: 500.0000 mg | ORAL_CAPSULE | Freq: Three times a day (TID) | ORAL | 0 refills | Status: DC

## 2020-11-26 MED ORDER — INSULIN ASPART 100 UNIT/ML ~~LOC~~ SOLN
10.0000 [IU] | Freq: Once | SUBCUTANEOUS | Status: AC
  Administered 2020-11-26: 10 [IU] via SUBCUTANEOUS

## 2020-11-26 NOTE — Progress Notes (Signed)
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[facility]) She tolerated treatment well with amazing response to treatment Her recent treatment was complicated by [id number]-11 infection but she is fully recovered from it Her anemia is almost completely resolved Her leukocytosis is back to normal Clinically, she has achieved hematological remission We will proceed with bendamustine only without the addition of rituximab for several reasons I am not comfortable prescribing rituximab without steroids and steroids will likely exacerbate her uncontrolled diabetes I plan to proceed with cycle 2 and 3 without rituximab and plan repeat imaging study after cycle 3 for objective assessment of response   Anemia in neoplastic disease Her anemia is back to normal She does not need transfusion support  Diabetes mellitus type 2, uncontrolled ([place]) She has severe uncontrolled diabetes She will get 1 dose of insulin today I spent a lot of time advising the patient the importance of getting her blood sugar under control while on treatment  Cellulitis She has mild cellulitis I will prescribe a course of antibiotics I will set up virtual visit to assess next week   No orders of the defined types were placed in this encounter.   All questions were answered. The patient knows to call the clinic with any problems, questions or concerns. The total time spent in the appointment was 30 minutes encounter with patients including review of chart and various tests results, discussions about plan of care and coordination of care plan   Heath Lark, MD 11/26/2020 12:47 PM  INTERVAL HISTORY: Please see below for problem oriented charting. She returns with her sister for further follow-up and chemotherapy Her treatment  was delayed due to recent COVID-19 infection but she is fully recovered The patient cannot taste of food and she ate more because of that She noticed some skin rashes in different places of her body Skin rashes are itchy She has a dog at home No nausea from treatment  SUMMARY OF ONCOLOGIC HISTORY: Oncology History  Splenic marginal zone b-cell lymphoma ([place])  01/11/2009 Initial Diagnosis   Splenic marginal zone b-cell lymphoma   06/06/2014 Bone Marrow Biopsy   Bone marrow aspirate and biopsy confirmed extensive involvement by CD20 positive non-Hodgkin's lymphoma.   06/07/2014 Imaging   She had a PET scan which showed predominant splenic involvement.   06/16/2014 - 07/07/2014 Chemotherapy   She started on weekly rituximab.   06/16/2014 Adverse Reaction   She had infusion reaction, resolved with IV dexamethasone.   08/18/2014 Imaging   PET CT scan show resolution of hypermetabolic activity.   04/08/2016 Imaging   1. No evidence for aortic dissection or vascular injury. 2. Mild atherosclerotic changes within the abdominal aorta and branch vessels without aneurysm. 3. No significant adenopathy or evidence for recurrent lymphoma. 4. Stable mild splenomegaly. 5. 5 mm nodule in the right lower lobe demonstrates interval increase in size. No follow-up needed if patient is low-risk. Non-contrast chest CT can be considered in 12 months if patient is high-risk.  6. Degenerate changes at the SI joints bilaterally.   01/21/2018 Imaging   1. Persistent mild splenomegaly, similar to prior examinations. No other findings to suggest recurrent disease in the chest, abdomen or pelvis. 2. Aortic atherosclerosis, in addition to two vessel coronary artery disease. Please note that although the presence of coronary artery calcium documents the  presence of coronary artery disease, the severity of this disease and any potential stenosis cannot be assessed on this non-gated CT examination. Assessment for potential  risk factor modification, dietary therapy or pharmacologic therapy may be warranted, if clinically indicated.  3. Enlarging heterogeneously enhancing and partially calcified left thyroid lobe nodule. Further evaluation with nonemergent thyroid ultrasound is recommended in the near future to better evaluate this lesion and determine if there is a need for fine-needle aspiration. 4. Additional incidental findings, as above.   09/28/2020 Imaging   1. Progressive splenomegaly. The splenic volume has almost doubled since 2019. 2. No adenopathy in the chest, abdomen or pelvis. 3. Stable fibroid uterus. 4. Moderate stool throughout the colon and down into the rectum suggesting constipation.   Aortic Atherosclerosis (ICD10-I70.0).   09/28/2020 Cancer Staging   Staging form: Hodgkin and Non-Hodgkin Lymphoma, AJCC 8th Edition - Clinical stage from 09/28/2020: Stage IE (Marginal zone lymphoma) - Signed by Heath Lark, MD on 09/28/2020   10/15/2020 -  Chemotherapy    Patient is on Treatment Plan: NON-HODGKINS LYMPHOMA RITUXIMAB D1 / BENDAMUSTINE D1,2 Q28D        REVIEW OF SYSTEMS:   Constitutional: Denies fevers, chills or abnormal weight loss Eyes: Denies blurriness of vision Ears, nose, mouth, throat, and face: Denies mucositis or sore throat Respiratory: Denies cough, dyspnea or wheezes Cardiovascular: Denies palpitation, chest discomfort or lower extremity swelling Gastrointestinal:  Denies nausea, heartburn or change in bowel habits Lymphatics: Denies new lymphadenopathy or easy bruising Neurological:Denies numbness, tingling or new weaknesses Behavioral/Psych: Mood is stable, no new changes  All other systems were reviewed with the patient and are negative.  I have reviewed the past medical history, past surgical history, social history and family history with the patient and they are unchanged from previous note.  ALLERGIES:  is allergic to erythromycin, rituxan [rituximab], lovastatin,  and tolnaftate.  MEDICATIONS:  Current Outpatient Medications  Medication Sig Dispense Refill  . cephALEXin (KEFLEX) 500 MG capsule Take 1 capsule (500 mg total) by mouth 3 (three) times daily. 21 capsule 0  . acetaminophen (TYLENOL) 500 MG tablet Take 1 tablet (500 mg total) by mouth every 8 (eight) hours as needed (pain). 30 tablet 0  . acyclovir (ZOVIRAX) 400 MG tablet Take 1 tablet (400 mg total) by mouth daily. 30 tablet 3  . aspirin 81 MG chewable tablet Chew 1 tablet (81 mg total) by mouth daily. 30 tablet 0  . Bempedoic Acid (NEXLETOL) 180 MG TABS Take 1 tablet by mouth daily.    . fish oil-omega-3 fatty acids 1000 MG capsule Take 1 g by mouth 2 (two) times daily.     . furosemide (LASIX) 40 MG tablet Take 40 mg by mouth as needed.    . hydrOXYzine (ATARAX/VISTARIL) 25 MG tablet Take 25 mg by mouth 3 (three) times daily.    Marland Kitchen losartan (COZAAR) 25 MG tablet Take 25 mg by mouth daily.    . metFORMIN (GLUCOPHAGE) 1000 MG tablet Take 1,000 mg by mouth 2 (two) times daily with a meal.      . metoprolol tartrate (LOPRESSOR) 100 MG tablet Take 1 tablet (100 mg total) by mouth 2 (two) times daily. PT OVERDUE FOR OV PLEASE CALL FOR APPT 60 tablet 0  . nitroGLYCERIN (NITROSTAT) 0.4 MG SL tablet Place 1 tablet (0.4 mg total) under the tongue every 5 (five) minutes as needed for chest pain. 30 tablet 0  . ondansetron (ZOFRAN) 8 MG tablet Take 1 tablet (8 mg total) by  mouth every 8 (eight) hours as needed for refractory nausea / vomiting. Start on day 2 after bendamustine chemo. 30 tablet 1  . prochlorperazine (COMPAZINE) 10 MG tablet Take 1 tablet (10 mg total) by mouth every 6 (six) hours as needed (Nausea or vomiting). 30 tablet 1  . TRULICITY 4.5 JT/7.0VX SOPN Inject 4.5 mg into the skin once a week.    . valACYclovir (VALTREX) 500 MG tablet Take 500 mg by mouth 2 (two) times daily as needed. Takes for outbreaks only.     No current facility-administered medications for this visit.    Facility-Administered Medications Ordered in Other Visits  Medication Dose Route Frequency Provider Last Rate Last Admin  . bendamustine (BENDEKA) 150 mg in sodium chloride 0.9 % 50 mL (2.6786 mg/mL) chemo infusion  60 mg/m2 (Treatment Plan Recorded) Intravenous Once Heath Lark, MD        PHYSICAL EXAMINATION: ECOG PERFORMANCE STATUS: 1 - Symptomatic but completely ambulatory  Vitals:   11/26/20 1137  BP: 122/73  Pulse: 100  Resp: 18  Temp: 98.2 F (36.8 C)  SpO2: 99%   Filed Weights   11/26/20 1137  Weight: 254 lb 3.2 oz (115.3 kg)    GENERAL:alert, no distress and comfortable SKIN: Noted faint cellulitis affecting her right leg NEURO: alert & oriented x 3 with fluent speech, no focal motor/sensory deficits  LABORATORY DATA:  I have reviewed the data as listed    Component Value Date/Time   NA 140 11/26/2020 1111   NA 138 07/24/2017 1105   K 3.5 11/26/2020 1111   K 4.2 07/24/2017 1105   CL 106 11/26/2020 1111   CL 106 11/19/2012 0837   CO2 25 11/26/2020 1111   CO2 26 07/24/2017 1105   GLUCOSE 263 (H) 11/26/2020 1111   GLUCOSE 362 (H) 07/24/2017 1105   GLUCOSE 107 (H) 11/19/2012 0837   BUN 10 11/26/2020 1111   BUN 10.0 07/24/2017 1105   CREATININE 0.76 11/26/2020 1111   CREATININE 0.8 07/24/2017 1105   CALCIUM 8.5 (L) 11/26/2020 1111   CALCIUM 8.8 07/24/2017 1105   PROT 6.2 (L) 11/26/2020 1111   PROT 6.3 (L) 07/24/2017 1105   ALBUMIN 3.6 11/26/2020 1111   ALBUMIN 3.4 (L) 07/24/2017 1105   AST 12 (L) 11/26/2020 1111   AST 13 07/24/2017 1105   ALT 15 11/26/2020 1111   ALT 21 07/24/2017 1105   ALKPHOS 114 11/26/2020 1111   ALKPHOS 112 07/24/2017 1105   BILITOT 0.5 11/26/2020 1111   BILITOT 0.55 07/24/2017 1105   GFRNONAA >60 11/26/2020 1111   GFRAA >60 05/18/2020 0825   GFRAA >60 10/22/2018 0809    No results found for: SPEP, UPEP  Lab Results  Component Value Date   WBC 4.9 11/26/2020   NEUTROABS 3.3 11/26/2020   HGB 11.9 (L) 11/26/2020   HCT  37.4 11/26/2020   MCV 74.5 (L) 11/26/2020   PLT 170 11/26/2020      Chemistry      Component Value Date/Time   NA 140 11/26/2020 1111   NA 138 07/24/2017 1105   K 3.5 11/26/2020 1111   K 4.2 07/24/2017 1105   CL 106 11/26/2020 1111   CL 106 11/19/2012 0837   CO2 25 11/26/2020 1111   CO2 26 07/24/2017 1105   BUN 10 11/26/2020 1111   BUN 10.0 07/24/2017 1105   CREATININE 0.76 11/26/2020 1111   CREATININE 0.8 07/24/2017 1105      Component Value Date/Time   CALCIUM 8.5 (L) 11/26/2020 1111  CALCIUM 8.8 07/24/2017 1105   ALKPHOS 114 11/26/2020 1111   ALKPHOS 112 07/24/2017 1105   AST 12 (L) 11/26/2020 1111   AST 13 07/24/2017 1105   ALT 15 11/26/2020 1111   ALT 21 07/24/2017 1105   BILITOT 0.5 11/26/2020 1111   BILITOT 0.55 07/24/2017 1105

## 2020-11-26 NOTE — Assessment & Plan Note (Signed)
She has severe uncontrolled diabetes She will get 1 dose of insulin today I spent a lot of time advising the patient the importance of getting her blood sugar under control while on treatment

## 2020-11-26 NOTE — Assessment & Plan Note (Signed)
Her anemia is back to normal She does not need transfusion support

## 2020-11-26 NOTE — Assessment & Plan Note (Signed)
She tolerated treatment well with amazing response to treatment Her recent treatment was complicated by [id number] infection but she is fully recovered from it Her anemia is almost completely resolved Her leukocytosis is back to normal Clinically, she has achieved hematological remission We will proceed with bendamustine only without the addition of rituximab for several reasons I am not comfortable prescribing rituximab without steroids and steroids will likely exacerbate her uncontrolled diabetes I plan to proceed with cycle 2 and 3 without rituximab and plan repeat imaging study after cycle 3 for objective assessment of response

## 2020-11-26 NOTE — Assessment & Plan Note (Signed)
She has mild cellulitis I will prescribe a course of antibiotics I will set up virtual visit to assess next week

## 2020-11-26 NOTE — Telephone Encounter (Signed)
Scheduled appts per 2/14 sch msg. Gave pt a print out of AVS.

## 2020-11-27 ENCOUNTER — Inpatient Hospital Stay: Payer: No Typology Code available for payment source

## 2020-11-27 VITALS — BP 114/79 | HR 110 | Temp 97.7°F | Resp 18

## 2020-11-27 DIAGNOSIS — C8307 Small cell B-cell lymphoma, spleen: Secondary | ICD-10-CM

## 2020-11-27 DIAGNOSIS — Z7189 Other specified counseling: Secondary | ICD-10-CM

## 2020-11-27 DIAGNOSIS — Z5111 Encounter for antineoplastic chemotherapy: Secondary | ICD-10-CM | POA: Diagnosis not present

## 2020-11-27 MED ORDER — SODIUM CHLORIDE 0.9 % IV SOLN
Freq: Once | INTRAVENOUS | Status: AC
  Filled 2020-11-27: qty 250

## 2020-11-27 MED ORDER — SODIUM CHLORIDE 0.9 % IV SOLN
60.0000 mg/m2 | Freq: Once | INTRAVENOUS | Status: AC
  Administered 2020-11-27: 150 mg via INTRAVENOUS
  Filled 2020-11-27: qty 6

## 2020-11-27 NOTE — Progress Notes (Signed)
OK to proceed with tx with HR of 106-110 per Dr. Alvy Bimler.

## 2020-11-27 NOTE — Patient Instructions (Signed)
[facility] Discharge Instructions for Patients Receiving Chemotherapy  Today you received the following chemotherapy agents Bendamustine(Bendeka)  To help prevent nausea and vomiting after your treatment, we encourage you to take your nausea medication as directed.   If you develop nausea and vomiting that is not controlled by your nausea medication, call the clinic.   BELOW ARE SYMPTOMS THAT SHOULD BE REPORTED IMMEDIATELY:  *FEVER GREATER THAN 100.5 F  *CHILLS WITH OR WITHOUT FEVER  NAUSEA AND VOMITING THAT IS NOT CONTROLLED WITH YOUR NAUSEA MEDICATION  *UNUSUAL SHORTNESS OF BREATH  *UNUSUAL BRUISING OR BLEEDING  TENDERNESS IN MOUTH AND THROAT WITH OR WITHOUT PRESENCE OF ULCERS  *URINARY PROBLEMS  *BOWEL PROBLEMS  UNUSUAL RASH Items with * indicate a potential emergency and should be followed up as soon as possible.  Feel free to call the clinic should you have any questions or concerns. The clinic phone number is [phone].  Please show the CHEMO ALERT CARD at check-in to the Emergency Department and triage nurse.   

## 2020-12-03 ENCOUNTER — Inpatient Hospital Stay (HOSPITAL_BASED_OUTPATIENT_CLINIC_OR_DEPARTMENT_OTHER): Payer: No Typology Code available for payment source | Admitting: Hematology and Oncology

## 2020-12-03 ENCOUNTER — Encounter: Payer: Self-pay | Admitting: Hematology and Oncology

## 2020-12-03 DIAGNOSIS — L03115 Cellulitis of right lower limb: Secondary | ICD-10-CM

## 2020-12-03 DIAGNOSIS — E1165 Type 2 diabetes mellitus with hyperglycemia: Secondary | ICD-10-CM | POA: Diagnosis not present

## 2020-12-03 DIAGNOSIS — C8307 Small cell B-cell lymphoma, spleen: Secondary | ICD-10-CM | POA: Diagnosis not present

## 2020-12-03 IMAGING — US US FNA BIOPSY THYROID 1ST LESION
1 series · 13 of 19 positions shown · non-contrast
Comparison: Thyroid ultrasound dated 10/10/19

MEDICATIONS:
None

COMPLICATIONS:
None immediate.

INDICATION: Patient with history of multinodular thyroid and thyroid ultrasound
on 10/10/2019 which revealed a 3.9 cm left inferior nodule which
meets criteria for biopsy. She presents today for the procedure.

EXAM:
ULTRASOUND GUIDED FINE NEEDLE ASPIRATION BIOPSY OF LEFT INFERIOR
THYROID NODULE
TECHNIQUE: Informed written consent was obtained from the patient after a
discussion of the risks, benefits and alternatives to treatment.
Questions regarding the procedure were encouraged and answered. A
timeout was performed prior to the initiation of the procedure.

[Series 1: us fna biopsy thyroid 1st lesion · 0.06mm/px · 19 acquisitions, 13 frames shown]
[im 1/19]
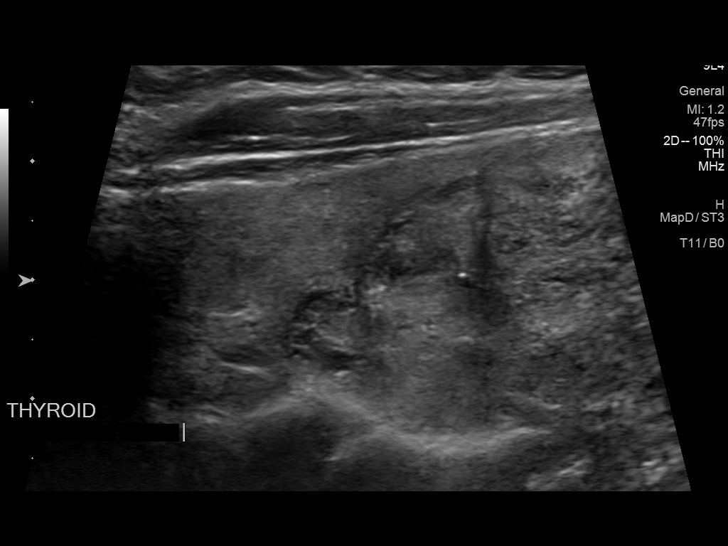
[im 3/19]
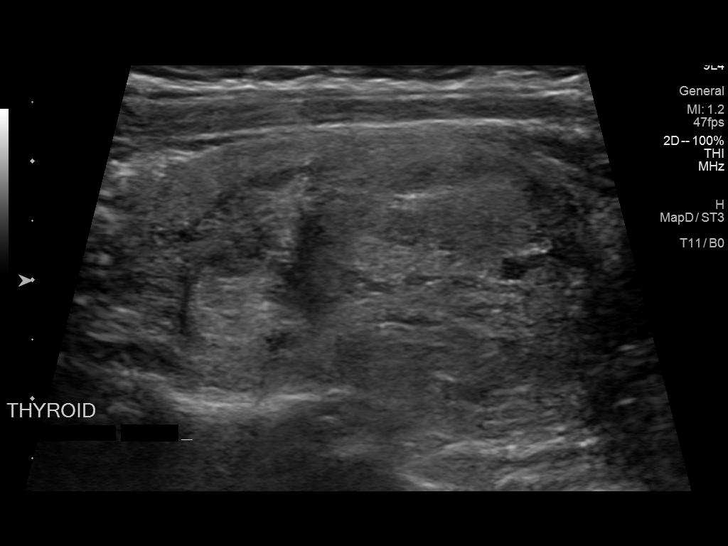
[im 4/19]
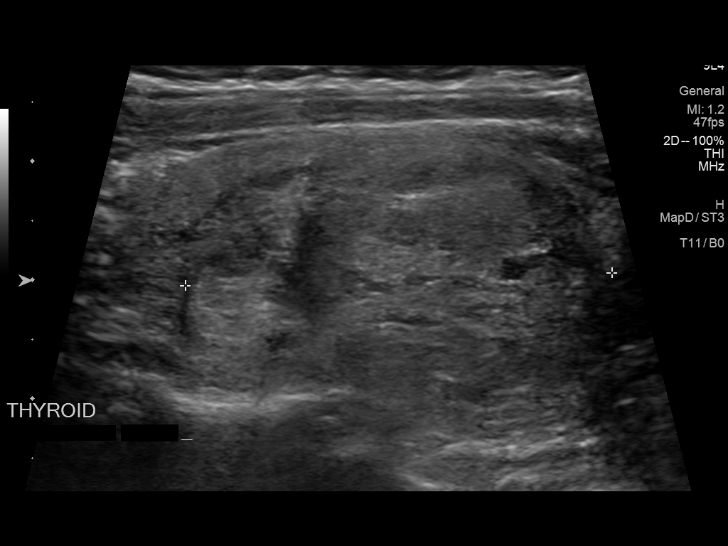
[im 6/19]
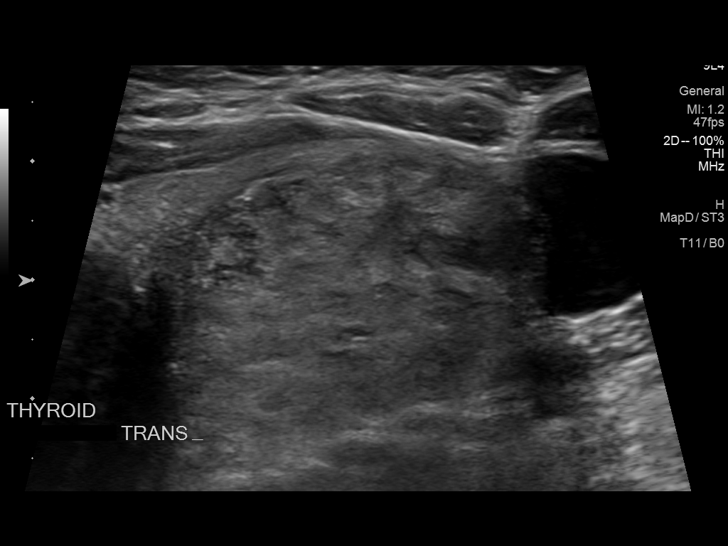
[im 7/19]
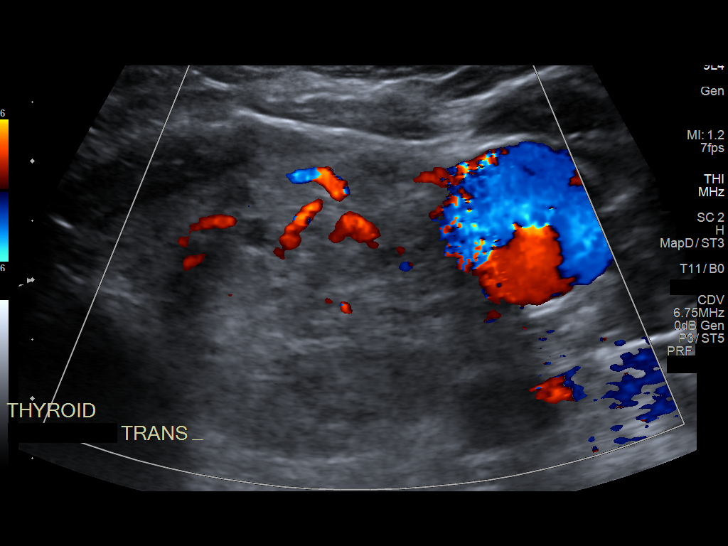
[im 9/19]
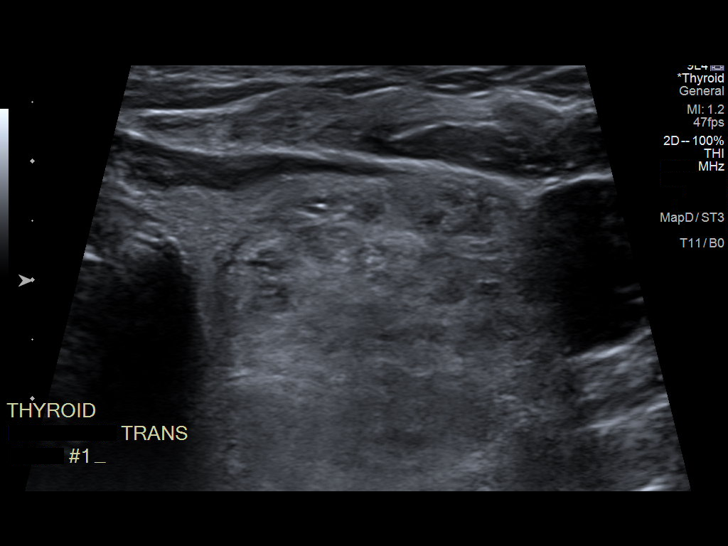
[im 10/19]
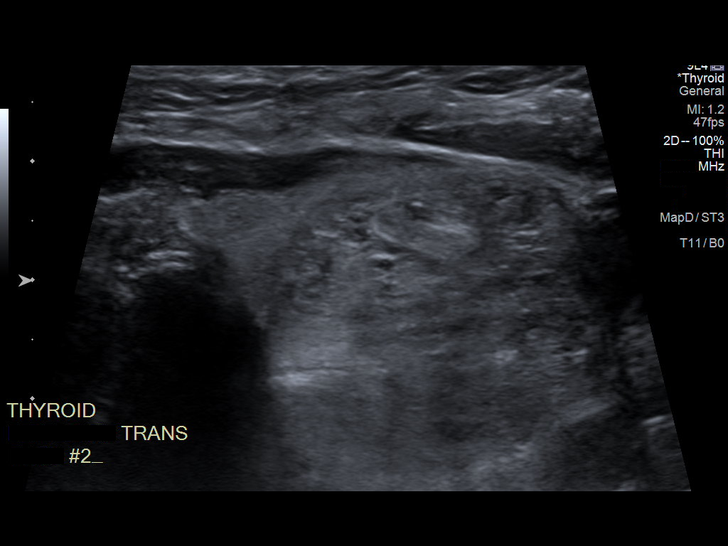
[im 11/19]
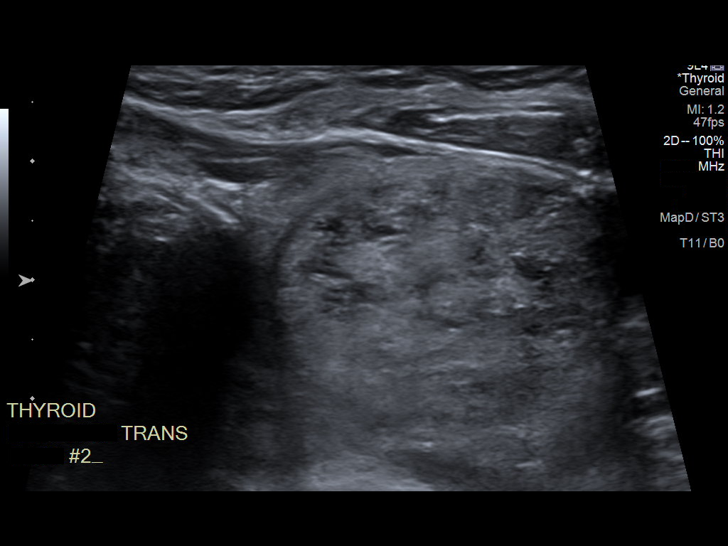
[im 13/19]
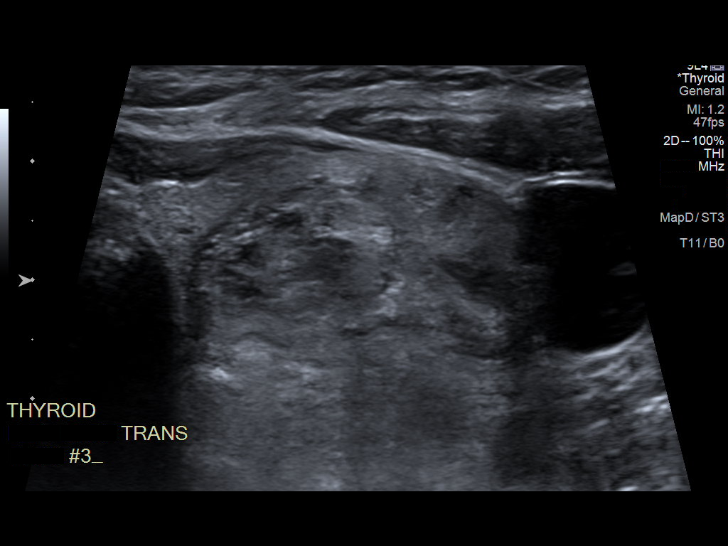
[im 14/19]
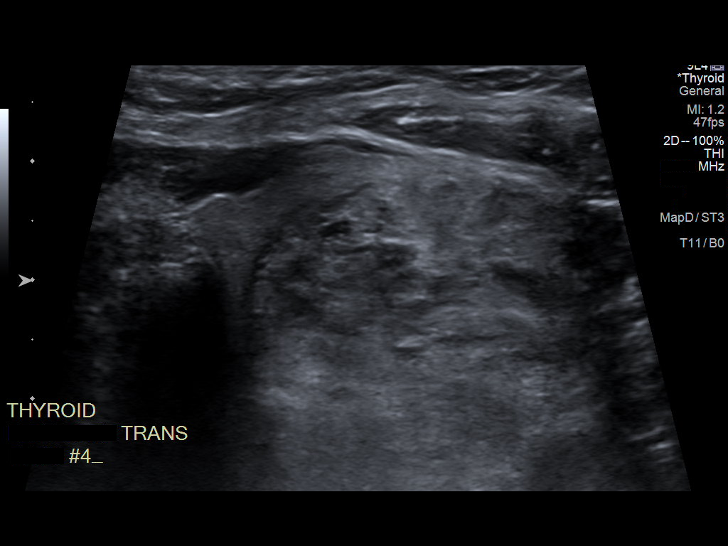
[im 16/19]
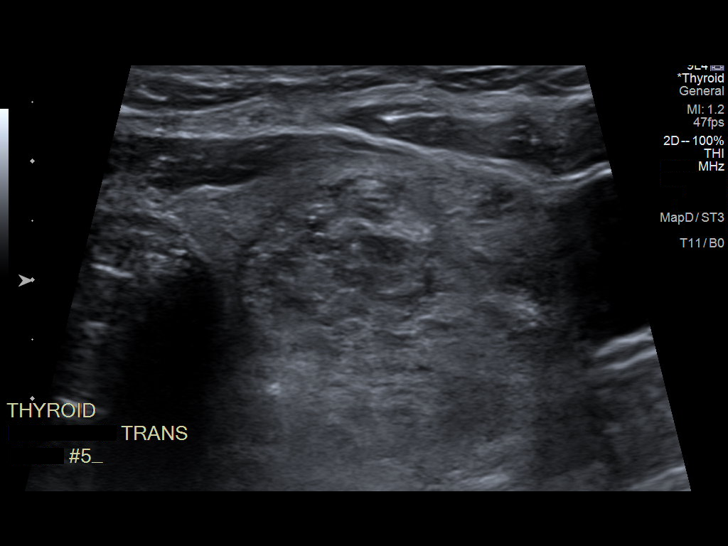
[im 17/19]
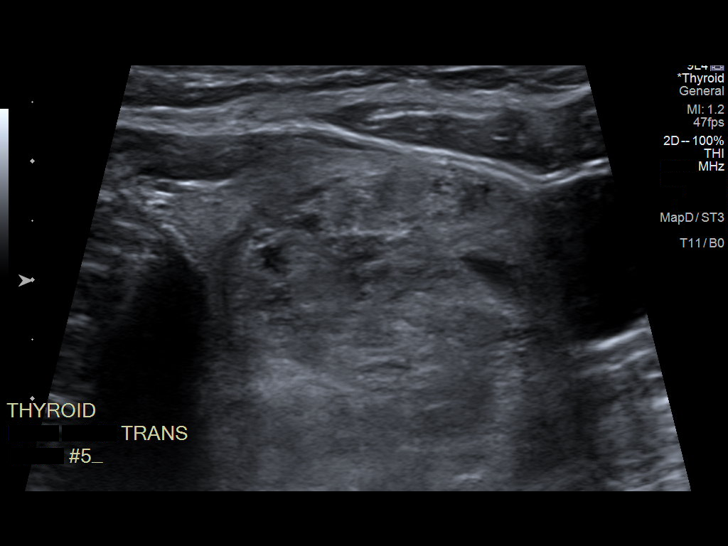
[im 19/19]
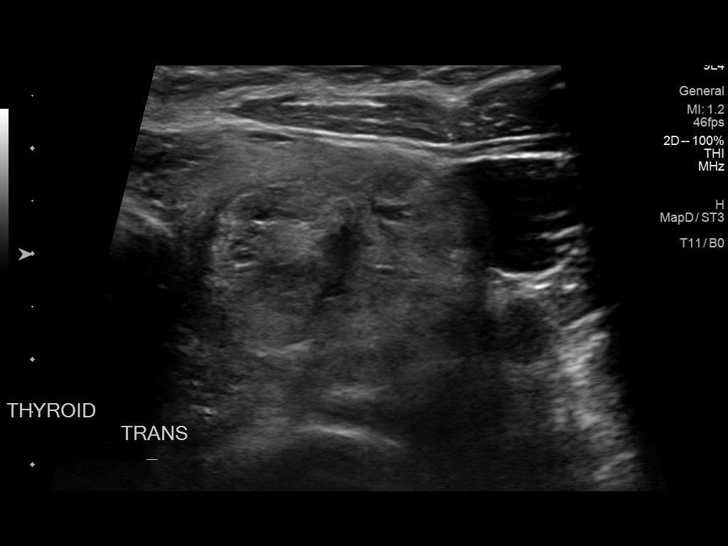

[13 of 19 positions shown; findings below may reference images not displayed]

Pre-procedural ultrasound scanning demonstrated unchanged size and
appearance of the indeterminate nodule within the left inferior
thyroid lobe

The procedure was planned. The neck was prepped in the usual sterile
fashion, and a sterile drape was applied covering the operative
field. A timeout was performed prior to the initiation of the
procedure. Local anesthesia was provided with 1% lidocaine.

Under direct ultrasound guidance, 5 FNA biopsies were performed of
the left inferior thyroid nodule with 25 gauge needles. Multiple
ultrasound images were saved for procedural documentation purposes.
The samples were prepared and submitted to pathology as well as for
Afirma testing.

Limited post procedural scanning was negative for hematoma or
additional complication. Dressings were placed. The patient
tolerated the above procedures procedure well without immediate
postprocedural complication.
FINDINGS: Nodule reference number based on prior diagnostic ultrasound: 4

Maximum size: 3.9 cm

Location: Left; Inferior

ACR TI-RADS risk category: TR4 (4-6 points)

Reason for biopsy: meets ACR TI-RADS criteria

Ultrasound imaging confirms appropriate placement of the needles
within the thyroid nodule.
IMPRESSION: Technically successful ultrasound guided fine needle aspiration
biopsy of left inferior thyroid nodule. Final pathology pending.

## 2020-12-03 NOTE — Assessment & Plan Note (Signed)
We had recent extensive discussion about the importance of tight blood sugar control According to the patient, she has made some drastic lifestyle changes She appears motivated to get her blood sugar under control I continue to encourage the patient

## 2020-12-03 NOTE — Progress Notes (Signed)
HEMATOLOGY-ONCOLOGY ELECTRONIC VISIT PROGRESS NOTE  Patient Care Team: Lennie Odor, [place] as PCP - General (Nurse Practitioner) Heath Lark, MD as Consulting Physician (Hematology and Oncology)  I connected with by telephone visit ASSESSMENT & PLAN:  Splenic marginal zone b-cell lymphoma ([place]) She tolerated recent chemotherapy well She will continue the same  Cellulitis Her cellulitis is improving She will complete the course of antibiotics  Diabetes mellitus type 2, uncontrolled ([place]) We had recent extensive discussion about the importance of tight blood sugar control According to the patient, she has made some drastic lifestyle changes She appears motivated to get her blood sugar under control I continue to encourage the patient   No orders of the defined types were placed in this encounter.   INTERVAL HISTORY: Please see below for problem oriented charting. The purpose of today's visit is to follow-up on symptom management regarding recent chemotherapy, uncontrolled diabetes and cellulitis According to the patient, her cellulitis is improving There is less swelling and redness She denies fever or chills She is attempting aggressive dietary changes Her average blood sugar in the morning is around 170 SUMMARY OF ONCOLOGIC HISTORY: Oncology History  Splenic marginal zone b-cell lymphoma ([place])  01/11/2009 Initial Diagnosis   Splenic marginal zone b-cell lymphoma   06/06/2014 Bone Marrow Biopsy   Bone marrow aspirate and biopsy confirmed extensive involvement by CD20 positive non-Hodgkin's lymphoma.   06/07/2014 Imaging   She had a PET scan which showed predominant splenic involvement.   06/16/2014 - 07/07/2014 Chemotherapy   She started on weekly rituximab.   06/16/2014 Adverse Reaction   She had infusion reaction, resolved with IV dexamethasone.   08/18/2014 Imaging   PET CT scan show resolution of hypermetabolic activity.   04/08/2016 Imaging   1. No evidence for  aortic dissection or vascular injury. 2. Mild atherosclerotic changes within the abdominal aorta and branch vessels without aneurysm. 3. No significant adenopathy or evidence for recurrent lymphoma. 4. Stable mild splenomegaly. 5. 5 mm nodule in the right lower lobe demonstrates interval increase in size. No follow-up needed if patient is low-risk. Non-contrast chest CT can be considered in 12 months if patient is high-risk.  6. Degenerate changes at the SI joints bilaterally.   01/21/2018 Imaging   1. Persistent mild splenomegaly, similar to prior examinations. No other findings to suggest recurrent disease in the chest, abdomen or pelvis. 2. Aortic atherosclerosis, in addition to two vessel coronary artery disease. Please note that although the presence of coronary artery calcium documents the presence of coronary artery disease, the severity of this disease and any potential stenosis cannot be assessed on this non-gated CT examination. Assessment for potential risk factor modification, dietary therapy or pharmacologic therapy may be warranted, if clinically indicated.  3. Enlarging heterogeneously enhancing and partially calcified left thyroid lobe nodule. Further evaluation with nonemergent thyroid ultrasound is recommended in the near future to better evaluate this lesion and determine if there is a need for fine-needle aspiration. 4. Additional incidental findings, as above.   09/28/2020 Imaging   1. Progressive splenomegaly. The splenic volume has almost doubled since 2019. 2. No adenopathy in the chest, abdomen or pelvis. 3. Stable fibroid uterus. 4. Moderate stool throughout the colon and down into the rectum suggesting constipation.   Aortic Atherosclerosis (ICD10-I70.0).   09/28/2020 Cancer Staging   Staging form: Hodgkin and Non-Hodgkin Lymphoma, AJCC 8th Edition - Clinical stage from 09/28/2020: Stage IE (Marginal zone lymphoma) - Signed by Heath Lark, MD on 09/28/2020    10/15/2020 -  Chemotherapy    Patient is on Treatment Plan: NON-HODGKINS LYMPHOMA RITUXIMAB D1 / BENDAMUSTINE D1,2 Q28D        REVIEW OF SYSTEMS:   Constitutional: Denies fevers, chills or abnormal weight loss Eyes: Denies blurriness of vision Ears, nose, mouth, throat, and face: Denies mucositis or sore throat Respiratory: Denies cough, dyspnea or wheezes Cardiovascular: Denies palpitation, chest discomfort Gastrointestinal:  Denies nausea, heartburn or change in bowel habits Skin: Denies abnormal skin rashes Lymphatics: Denies new lymphadenopathy or easy bruising Neurological:Denies numbness, tingling or new weaknesses Behavioral/Psych: Mood is stable, no new changes  Extremities: No lower extremity edema All other systems were reviewed with the patient and are negative.  I have reviewed the past medical history, past surgical history, social history and family history with the patient and they are unchanged from previous note.  ALLERGIES:  is allergic to erythromycin, rituxan [rituximab], lovastatin, and tolnaftate.  MEDICATIONS:  Current Outpatient Medications  Medication Sig Dispense Refill  . acetaminophen (TYLENOL) 500 MG tablet Take 1 tablet (500 mg total) by mouth every 8 (eight) hours as needed (pain). 30 tablet 0  . acyclovir (ZOVIRAX) 400 MG tablet Take 1 tablet (400 mg total) by mouth daily. 30 tablet 3  . aspirin 81 MG chewable tablet Chew 1 tablet (81 mg total) by mouth daily. 30 tablet 0  . Bempedoic Acid (NEXLETOL) 180 MG TABS Take 1 tablet by mouth daily.    . cephALEXin (KEFLEX) 500 MG capsule Take 1 capsule (500 mg total) by mouth 3 (three) times daily. 21 capsule 0  . fish oil-omega-3 fatty acids 1000 MG capsule Take 1 g by mouth 2 (two) times daily.     . furosemide (LASIX) 40 MG tablet Take 40 mg by mouth as needed.    . hydrOXYzine (ATARAX/VISTARIL) 25 MG tablet Take 25 mg by mouth 3 (three) times daily.    Marland Kitchen losartan (COZAAR) 25 MG tablet Take 25 mg by  mouth daily.    . metFORMIN (GLUCOPHAGE) 1000 MG tablet Take 1,000 mg by mouth 2 (two) times daily with a meal.      . metoprolol tartrate (LOPRESSOR) 100 MG tablet Take 1 tablet (100 mg total) by mouth 2 (two) times daily. PT OVERDUE FOR OV PLEASE CALL FOR APPT 60 tablet 0  . nitroGLYCERIN (NITROSTAT) 0.4 MG SL tablet Place 1 tablet (0.4 mg total) under the tongue every 5 (five) minutes as needed for chest pain. 30 tablet 0  . ondansetron (ZOFRAN) 8 MG tablet Take 1 tablet (8 mg total) by mouth every 8 (eight) hours as needed for refractory nausea / vomiting. Start on day 2 after bendamustine chemo. 30 tablet 1  . prochlorperazine (COMPAZINE) 10 MG tablet Take 1 tablet (10 mg total) by mouth every 6 (six) hours as needed (Nausea or vomiting). 30 tablet 1  . TRULICITY 4.5 ZT/2.4PY SOPN Inject 4.5 mg into the skin once a week.    . valACYclovir (VALTREX) 500 MG tablet Take 500 mg by mouth 2 (two) times daily as needed. Takes for outbreaks only.     No current facility-administered medications for this visit.    PHYSICAL EXAMINATION: ECOG PERFORMANCE STATUS: 0 - Asymptomatic  LABORATORY DATA:  I have reviewed the data as listed CMP Latest Ref Rng & Units 11/26/2020 10/15/2020 09/21/2020  Glucose 70 - 99 mg/dL 263(H) 206(H) 173(H)  BUN 6 - 20 mg/dL _0 Creatinine 0.44 - 1.00 mg/dL 0.76 0.80 0.88  Sodium 135 - 145 mmol/L 140 140 142  Potassium 3.5 - 5.1 mmol/L 3.5 4.1 4.2  Chloride 98 - 111 mmol/L 106 106 103  CO2 22 - 32 mmol/L _0 Calcium 8.9 - 10.3 mg/dL 8.5(L) 9.1 9.6  Total Protein 6.5 - 8.1 g/dL 6.2(L) 6.5 6.9  Total Bilirubin 0.3 - 1.2 mg/dL 0.5 0.7 0.7  Alkaline Phos 38 - 126 U/L 114 90 100  AST 15 - 41 U/L 12(L) 17 19  ALT 0 - 44 U/L _1 Lab Results  Component Value Date   WBC 4.9 11/26/2020   HGB 11.9 (L) 11/26/2020   HCT 37.4 11/26/2020   MCV 74.5 (L) 11/26/2020   PLT 170 11/26/2020   NEUTROABS 3.3 11/26/2020     I discussed the assessment and  treatment plan with the patient. The patient was provided an opportunity to ask questions and all were answered. The patient agreed with the plan and demonstrated an understanding of the instructions. The patient was advised to call back or seek an in-person evaluation if the symptoms worsen or if the condition fails to improve as anticipated.    I spent 20 minutes for the appointment reviewing test results, discuss management and coordination of care.  Heath Lark, MD 12/03/2020 2:19 PM

## 2020-12-03 NOTE — Assessment & Plan Note (Signed)
Her cellulitis is improving She will complete the course of antibiotics

## 2020-12-03 NOTE — Assessment & Plan Note (Signed)
She tolerated recent chemotherapy well She will continue the same

## 2020-12-23 ENCOUNTER — Other Ambulatory Visit: Payer: Self-pay | Admitting: Hematology and Oncology

## 2020-12-23 DIAGNOSIS — C8307 Small cell B-cell lymphoma, spleen: Secondary | ICD-10-CM

## 2020-12-23 DIAGNOSIS — Z7189 Other specified counseling: Secondary | ICD-10-CM

## 2020-12-25 ENCOUNTER — Inpatient Hospital Stay: Payer: No Typology Code available for payment source | Admitting: Hematology and Oncology

## 2020-12-25 ENCOUNTER — Inpatient Hospital Stay: Payer: No Typology Code available for payment source

## 2020-12-25 ENCOUNTER — Encounter: Payer: Self-pay | Admitting: Hematology and Oncology

## 2020-12-25 ENCOUNTER — Inpatient Hospital Stay: Payer: No Typology Code available for payment source | Attending: Hematology and Oncology

## 2020-12-25 ENCOUNTER — Other Ambulatory Visit: Payer: Self-pay

## 2020-12-25 DIAGNOSIS — C8307 Small cell B-cell lymphoma, spleen: Secondary | ICD-10-CM

## 2020-12-25 DIAGNOSIS — Z7982 Long term (current) use of aspirin: Secondary | ICD-10-CM | POA: Diagnosis not present

## 2020-12-25 DIAGNOSIS — R600 Localized edema: Secondary | ICD-10-CM | POA: Diagnosis not present

## 2020-12-25 DIAGNOSIS — Z79899 Other long term (current) drug therapy: Secondary | ICD-10-CM | POA: Diagnosis not present

## 2020-12-25 DIAGNOSIS — Z7984 Long term (current) use of oral hypoglycemic drugs: Secondary | ICD-10-CM | POA: Diagnosis not present

## 2020-12-25 DIAGNOSIS — E119 Type 2 diabetes mellitus without complications: Secondary | ICD-10-CM | POA: Diagnosis not present

## 2020-12-25 DIAGNOSIS — Z5111 Encounter for antineoplastic chemotherapy: Secondary | ICD-10-CM | POA: Insufficient documentation

## 2020-12-25 DIAGNOSIS — Z7189 Other specified counseling: Secondary | ICD-10-CM

## 2020-12-25 DIAGNOSIS — E1165 Type 2 diabetes mellitus with hyperglycemia: Secondary | ICD-10-CM | POA: Diagnosis not present

## 2020-12-25 LAB — CBC WITH DIFFERENTIAL (CANCER CENTER ONLY)
Abs Immature Granulocytes: 0.01 10*3/uL (ref 0.00–0.07)
Basophils Absolute: 0.1 10*3/uL (ref 0.0–0.1)
Basophils Relative: 1 %
Eosinophils Absolute: 0.2 10*3/uL (ref 0.0–0.5)
Eosinophils Relative: 4 %
HCT: 40.3 % (ref 36.0–46.0)
Hemoglobin: 13 g/dL (ref 12.0–15.0)
Immature Granulocytes: 0 %
Lymphocytes Relative: 14 %
Lymphs Abs: 0.7 10*3/uL (ref 0.7–4.0)
MCH: 23.7 pg — ABNORMAL LOW (ref 26.0–34.0)
MCHC: 32.3 g/dL (ref 30.0–36.0)
MCV: 73.4 fL — ABNORMAL LOW (ref 80.0–100.0)
Monocytes Absolute: 0.5 10*3/uL (ref 0.1–1.0)
Monocytes Relative: 9 %
Neutro Abs: 3.6 10*3/uL (ref 1.7–7.7)
Neutrophils Relative %: 72 %
Platelet Count: 174 10*3/uL (ref 150–400)
RBC: 5.49 MIL/uL — ABNORMAL HIGH (ref 3.87–5.11)
RDW: 15.9 % — ABNORMAL HIGH (ref 11.5–15.5)
WBC Count: 5 10*3/uL (ref 4.0–10.5)
nRBC: 0 % (ref 0.0–0.2)

## 2020-12-25 LAB — CMP (CANCER CENTER ONLY)
ALT: 15 U/L (ref 0–44)
AST: 13 U/L — ABNORMAL LOW (ref 15–41)
Albumin: 3.9 g/dL (ref 3.5–5.0)
Alkaline Phosphatase: 78 U/L (ref 38–126)
Anion gap: 10 (ref 5–15)
BUN: 12 mg/dL (ref 6–20)
CO2: 22 mmol/L (ref 22–32)
Calcium: 9.1 mg/dL (ref 8.9–10.3)
Chloride: 109 mmol/L (ref 98–111)
Creatinine: 0.68 mg/dL (ref 0.44–1.00)
GFR, Estimated: >60 mL/min (ref >60)
Glucose, Bld: 132 mg/dL — ABNORMAL HIGH (ref 70–99)
Potassium: 3.7 mmol/L (ref 3.5–5.1)
Sodium: 141 mmol/L (ref 135–145)
Total Bilirubin: 0.9 mg/dL (ref 0.3–1.2)
Total Protein: 6.6 g/dL (ref 6.5–8.1)

## 2020-12-25 LAB — URIC ACID: Uric Acid, Serum: 5.4 mg/dL (ref 2.5–7.1)

## 2020-12-25 MED ORDER — PALONOSETRON HCL INJECTION 0.25 MG/5ML
0.2500 mg | Freq: Once | INTRAVENOUS | Status: AC
  Administered 2020-12-25: 0.25 mg via INTRAVENOUS

## 2020-12-25 MED ORDER — SODIUM CHLORIDE 0.9 % IV SOLN
60.0000 mg/m2 | Freq: Once | INTRAVENOUS | Status: AC
  Administered 2020-12-25: 150 mg via INTRAVENOUS
  Filled 2020-12-25: qty 6

## 2020-12-25 MED ORDER — PALONOSETRON HCL INJECTION 0.25 MG/5ML
INTRAVENOUS | Status: AC
  Filled 2020-12-25: qty 5

## 2020-12-25 MED ORDER — SODIUM CHLORIDE 0.9 % IV SOLN
Freq: Once | INTRAVENOUS | Status: AC
  Filled 2020-12-25: qty 250

## 2020-12-25 MED ORDER — SODIUM CHLORIDE 0.9% FLUSH
10.0000 mL | INTRAVENOUS | Status: DC | PRN
  Filled 2020-12-25: qty 10

## 2020-12-25 NOTE — Progress Notes (Signed)
[facility] OFFICE PROGRESS NOTE  Patient Care Team: Lennie Odor, [place] as PCP - General (Nurse Practitioner) Heath Lark, MD as Consulting Physician (Hematology and Oncology)  ASSESSMENT & PLAN:  Splenic marginal zone b-cell lymphoma ([place]) She tolerated treatment very well Her pancytopenia has resolved She has successfully modify her lifestyle and lost some weight I continue to encourage the patient We will continue treatment as scheduled  Diabetes mellitus type 2, uncontrolled ([place]) Her blood sugar is better controlled with aggressive lifestyle changes I continue to encourage the patient  Bilateral leg edema Her leg edema is minimum For now, she will continue diuretic therapy I also encouraged her to exercise as tolerated   No orders of the defined types were placed in this encounter.   All questions were answered. The patient knows to call the clinic with any problems, questions or concerns. The total time spent in the appointment was 20 minutes encounter with patients including review of chart and various tests results, discussions about plan of care and coordination of care plan   Heath Lark, MD 12/25/2020 1:40 PM  INTERVAL HISTORY: Please see below for problem oriented charting. She returns for treatment and follow-up She is doing well She has successfully lost some weight due to to aggressive lifestyle changes Her skin appears okay although she has some scratches She is extremely motivated to eat healthy and to continue with lifestyle changes  SUMMARY OF ONCOLOGIC HISTORY: Oncology History  Splenic marginal zone b-cell lymphoma ([place])  01/11/2009 Initial Diagnosis   Splenic marginal zone b-cell lymphoma   06/06/2014 Bone Marrow Biopsy   Bone marrow aspirate and biopsy confirmed extensive involvement by CD20 positive non-Hodgkin's lymphoma.   06/07/2014 Imaging   She had a PET scan which showed predominant splenic involvement.   06/16/2014 - 07/07/2014  Chemotherapy   She started on weekly rituximab.   06/16/2014 Adverse Reaction   She had infusion reaction, resolved with IV dexamethasone.   08/18/2014 Imaging   PET CT scan show resolution of hypermetabolic activity.   04/08/2016 Imaging   1. No evidence for aortic dissection or vascular injury. 2. Mild atherosclerotic changes within the abdominal aorta and branch vessels without aneurysm. 3. No significant adenopathy or evidence for recurrent lymphoma. 4. Stable mild splenomegaly. 5. 5 mm nodule in the right lower lobe demonstrates interval increase in size. No follow-up needed if patient is low-risk. Non-contrast chest CT can be considered in 12 months if patient is high-risk.  6. Degenerate changes at the SI joints bilaterally.   01/21/2018 Imaging   1. Persistent mild splenomegaly, similar to prior examinations. No other findings to suggest recurrent disease in the chest, abdomen or pelvis. 2. Aortic atherosclerosis, in addition to two vessel coronary artery disease. Please note that although the presence of coronary artery calcium documents the presence of coronary artery disease, the severity of this disease and any potential stenosis cannot be assessed on this non-gated CT examination. Assessment for potential risk factor modification, dietary therapy or pharmacologic therapy may be warranted, if clinically indicated.  3. Enlarging heterogeneously enhancing and partially calcified left thyroid lobe nodule. Further evaluation with nonemergent thyroid ultrasound is recommended in the near future to better evaluate this lesion and determine if there is a need for fine-needle aspiration. 4. Additional incidental findings, as above.   09/28/2020 Imaging   1. Progressive splenomegaly. The splenic volume has almost doubled since 2019. 2. No adenopathy in the chest, abdomen or pelvis. 3. Stable fibroid uterus. 4. Moderate stool throughout  the colon and down into the rectum suggesting  constipation.   Aortic Atherosclerosis (ICD10-I70.0).   09/28/2020 Cancer Staging   Staging form: Hodgkin and Non-Hodgkin Lymphoma, AJCC 8th Edition - Clinical stage from 09/28/2020: Stage IE (Marginal zone lymphoma) - Signed by Heath Lark, MD on 09/28/2020   10/15/2020 -  Chemotherapy    Patient is on Treatment Plan: NON-HODGKINS LYMPHOMA RITUXIMAB D1 / BENDAMUSTINE D1,2 Q28D        REVIEW OF SYSTEMS:   Constitutional: Denies fevers, chills  Eyes: Denies blurriness of vision Ears, nose, mouth, throat, and face: Denies mucositis or sore throat Respiratory: Denies cough, dyspnea or wheezes Cardiovascular: Denies palpitation, chest discomfort or lower extremity swelling Gastrointestinal:  Denies nausea, heartburn or change in bowel habits Lymphatics: Denies new lymphadenopathy or easy bruising Neurological:Denies numbness, tingling or new weaknesses Behavioral/Psych: Mood is stable, no new changes  All other systems were reviewed with the patient and are negative.  I have reviewed the past medical history, past surgical history, social history and family history with the patient and they are unchanged from previous note.  ALLERGIES:  is allergic to erythromycin, rituxan [rituximab], lovastatin, and tolnaftate.  MEDICATIONS:  Current Outpatient Medications  Medication Sig Dispense Refill  . acetaminophen (TYLENOL) 500 MG tablet Take 1 tablet (500 mg total) by mouth every 8 (eight) hours as needed (pain). 30 tablet 0  . acyclovir (ZOVIRAX) 400 MG tablet TAKE 1 TABLET BY MOUTH EVERY DAY 90 tablet 1  . aspirin 81 MG chewable tablet Chew 1 tablet (81 mg total) by mouth daily. 30 tablet 0  . Bempedoic Acid (NEXLETOL) 180 MG TABS Take 1 tablet by mouth daily.    . fish oil-omega-3 fatty acids 1000 MG capsule Take 1 g by mouth 2 (two) times daily.     . furosemide (LASIX) 40 MG tablet Take 40 mg by mouth as needed.    . hydrOXYzine (ATARAX/VISTARIL) 25 MG tablet Take 25 mg by mouth 3  (three) times daily.    Marland Kitchen losartan (COZAAR) 25 MG tablet Take 25 mg by mouth daily.    . metFORMIN (GLUCOPHAGE) 1000 MG tablet Take 1,000 mg by mouth 2 (two) times daily with a meal.      . metoprolol tartrate (LOPRESSOR) 100 MG tablet Take 1 tablet (100 mg total) by mouth 2 (two) times daily. PT OVERDUE FOR OV PLEASE CALL FOR APPT 60 tablet 0  . nitroGLYCERIN (NITROSTAT) 0.4 MG SL tablet Place 1 tablet (0.4 mg total) under the tongue every 5 (five) minutes as needed for chest pain. 30 tablet 0  . ondansetron (ZOFRAN) 8 MG tablet Take 1 tablet (8 mg total) by mouth every 8 (eight) hours as needed for refractory nausea / vomiting. Start on day 2 after bendamustine chemo. 30 tablet 1  . prochlorperazine (COMPAZINE) 10 MG tablet Take 1 tablet (10 mg total) by mouth every 6 (six) hours as needed (Nausea or vomiting). 30 tablet 1  . TRULICITY 4.5 AE/8.2BR SOPN Inject 4.5 mg into the skin once a week.    . valACYclovir (VALTREX) 500 MG tablet Take 500 mg by mouth 2 (two) times daily as needed. Takes for outbreaks only.     No current facility-administered medications for this visit.   Facility-Administered Medications Ordered in Other Visits  Medication Dose Route Frequency Provider Last Rate Last Admin  . sodium chloride flush (NS) 0.9 % injection 10 mL  10 mL Intracatheter PRN Heath Lark, MD        PHYSICAL EXAMINATION:  ECOG PERFORMANCE STATUS: 1 - Symptomatic but completely ambulatory  Vitals:   12/25/20 0845 12/25/20 0849  BP: 137/83 121/78  Pulse: (!) 106   Resp: 18   Temp: (!) 94.5 F (34.7 C)   SpO2: 99%    Filed Weights   12/25/20 0845  Weight: 249 lb 6.4 oz (113.1 kg)    GENERAL:alert, no distress and comfortable SKIN: Her skin appears okay except for scratches.  She does not need antibiotics and it does not appear to be cellulitis EYES: normal, Conjunctiva are pink and non-injected, sclera clear OROPHARYNX:no exudate, no erythema and lips, buccal mucosa, and tongue normal   NECK: supple, thyroid normal size, non-tender, without nodularity LYMPH:  no palpable lymphadenopathy in the cervical, axillary or inguinal LUNGS: clear to auscultation and percussion with normal breathing effort HEART: regular rate & rhythm and no murmurs with mild bilateral lower extremity edema ABDOMEN:abdomen soft, non-tender and normal bowel sounds Musculoskeletal:no cyanosis of digits and no clubbing  NEURO: alert & oriented x 3 with fluent speech, no focal motor/sensory deficits  LABORATORY DATA:  I have reviewed the data as listed    Component Value Date/Time   NA 141 12/25/2020 0832   NA 138 07/24/2017 1105   K 3.7 12/25/2020 0832   K 4.2 07/24/2017 1105   CL 109 12/25/2020 0832   CL 106 11/19/2012 0837   CO2 22 12/25/2020 0832   CO2 26 07/24/2017 1105   GLUCOSE 132 (H) 12/25/2020 0832   GLUCOSE 362 (H) 07/24/2017 1105   GLUCOSE 107 (H) 11/19/2012 0837   BUN 12 12/25/2020 0832   BUN 10.0 07/24/2017 1105   CREATININE 0.68 12/25/2020 0832   CREATININE 0.8 07/24/2017 1105   CALCIUM 9.1 12/25/2020 0832   CALCIUM 8.8 07/24/2017 1105   PROT 6.6 12/25/2020 0832   PROT 6.3 (L) 07/24/2017 1105   ALBUMIN 3.9 12/25/2020 0832   ALBUMIN 3.4 (L) 07/24/2017 1105   AST 13 (L) 12/25/2020 0832   AST 13 07/24/2017 1105   ALT 15 12/25/2020 0832   ALT 21 07/24/2017 1105   ALKPHOS 78 12/25/2020 0832   ALKPHOS 112 07/24/2017 1105   BILITOT 0.9 12/25/2020 0832   BILITOT 0.55 07/24/2017 1105   GFRNONAA >60 12/25/2020 0832   GFRAA >60 05/18/2020 0825   GFRAA >60 10/22/2018 0809    No results found for: SPEP, UPEP  Lab Results  Component Value Date   WBC 5.0 12/25/2020   NEUTROABS 3.6 12/25/2020   HGB 13.0 12/25/2020   HCT 40.3 12/25/2020   MCV 73.4 (L) 12/25/2020   PLT 174 12/25/2020      Chemistry      Component Value Date/Time   NA 141 12/25/2020 0832   NA 138 07/24/2017 1105   K 3.7 12/25/2020 0832   K 4.2 07/24/2017 1105   CL 109 12/25/2020 0832   CL 106  11/19/2012 0837   CO2 22 12/25/2020 0832   CO2 26 07/24/2017 1105   BUN 12 12/25/2020 0832   BUN 10.0 07/24/2017 1105   CREATININE 0.68 12/25/2020 0832   CREATININE 0.8 07/24/2017 1105      Component Value Date/Time   CALCIUM 9.1 12/25/2020 0832   CALCIUM 8.8 07/24/2017 1105   ALKPHOS 78 12/25/2020 0832   ALKPHOS 112 07/24/2017 1105   AST 13 (L) 12/25/2020 0832   AST 13 07/24/2017 1105   ALT 15 12/25/2020 0832   ALT 21 07/24/2017 1105   BILITOT 0.9 12/25/2020 0832   BILITOT 0.55 07/24/2017 1105

## 2020-12-25 NOTE — Assessment & Plan Note (Signed)
She tolerated treatment very well Her pancytopenia has resolved She has successfully modify her lifestyle and lost some weight I continue to encourage the patient We will continue treatment as scheduled

## 2020-12-25 NOTE — Assessment & Plan Note (Signed)
Her blood sugar is better controlled with aggressive lifestyle changes I continue to encourage the patient

## 2020-12-25 NOTE — Assessment & Plan Note (Signed)
Her leg edema is minimum For now, she will continue diuretic therapy I also encouraged her to exercise as tolerated

## 2020-12-25 NOTE — Patient Instructions (Signed)
[facility] Discharge Instructions for Patients Receiving Chemotherapy  Today you received the following chemotherapy agents Bendamustine(Bendeka)  To help prevent nausea and vomiting after your treatment, we encourage you to take your nausea medication as directed.   If you develop nausea and vomiting that is not controlled by your nausea medication, call the clinic.   BELOW ARE SYMPTOMS THAT SHOULD BE REPORTED IMMEDIATELY:  *FEVER GREATER THAN 100.5 F  *CHILLS WITH OR WITHOUT FEVER  NAUSEA AND VOMITING THAT IS NOT CONTROLLED WITH YOUR NAUSEA MEDICATION  *UNUSUAL SHORTNESS OF BREATH  *UNUSUAL BRUISING OR BLEEDING  TENDERNESS IN MOUTH AND THROAT WITH OR WITHOUT PRESENCE OF ULCERS  *URINARY PROBLEMS  *BOWEL PROBLEMS  UNUSUAL RASH Items with * indicate a potential emergency and should be followed up as soon as possible.  Feel free to call the clinic should you have any questions or concerns. The clinic phone number is [phone].  Please show the CHEMO ALERT CARD at check-in to the Emergency Department and triage nurse.   

## 2020-12-26 ENCOUNTER — Inpatient Hospital Stay: Payer: No Typology Code available for payment source

## 2020-12-26 ENCOUNTER — Other Ambulatory Visit: Payer: Self-pay

## 2020-12-26 VITALS — BP 122/85 | HR 99 | Temp 97.9°F | Resp 18

## 2020-12-26 DIAGNOSIS — Z7189 Other specified counseling: Secondary | ICD-10-CM

## 2020-12-26 DIAGNOSIS — Z5111 Encounter for antineoplastic chemotherapy: Secondary | ICD-10-CM | POA: Diagnosis not present

## 2020-12-26 DIAGNOSIS — C8307 Small cell B-cell lymphoma, spleen: Secondary | ICD-10-CM

## 2020-12-26 MED ORDER — SODIUM CHLORIDE 0.9 % IV SOLN
Freq: Once | INTRAVENOUS | Status: AC
  Filled 2020-12-26: qty 250

## 2020-12-26 MED ORDER — BENDAMUSTINE HCL CHEMO INJECTION 100 MG/4ML
60.0000 mg/m2 | Freq: Once | INTRAVENOUS | Status: AC
  Administered 2020-12-26: 150 mg via INTRAVENOUS
  Filled 2020-12-26: qty 6

## 2020-12-26 NOTE — Patient Instructions (Signed)
[facility] Discharge Instructions for Patients Receiving Chemotherapy  Today you received the following chemotherapy agents Bendamustine(Bendeka)  To help prevent nausea and vomiting after your treatment, we encourage you to take your nausea medication as directed.   If you develop nausea and vomiting that is not controlled by your nausea medication, call the clinic.   BELOW ARE SYMPTOMS THAT SHOULD BE REPORTED IMMEDIATELY:  *FEVER GREATER THAN 100.5 F  *CHILLS WITH OR WITHOUT FEVER  NAUSEA AND VOMITING THAT IS NOT CONTROLLED WITH YOUR NAUSEA MEDICATION  *UNUSUAL SHORTNESS OF BREATH  *UNUSUAL BRUISING OR BLEEDING  TENDERNESS IN MOUTH AND THROAT WITH OR WITHOUT PRESENCE OF ULCERS  *URINARY PROBLEMS  *BOWEL PROBLEMS  UNUSUAL RASH Items with * indicate a potential emergency and should be followed up as soon as possible.  Feel free to call the clinic should you have any questions or concerns. The clinic phone number is [phone].  Please show the CHEMO ALERT CARD at check-in to the Emergency Department and triage nurse.   

## 2021-01-22 ENCOUNTER — Other Ambulatory Visit: Payer: Self-pay

## 2021-01-22 ENCOUNTER — Inpatient Hospital Stay: Payer: No Typology Code available for payment source | Attending: Hematology and Oncology

## 2021-01-22 ENCOUNTER — Inpatient Hospital Stay: Payer: No Typology Code available for payment source

## 2021-01-22 ENCOUNTER — Encounter: Payer: Self-pay | Admitting: Hematology and Oncology

## 2021-01-22 ENCOUNTER — Inpatient Hospital Stay (HOSPITAL_BASED_OUTPATIENT_CLINIC_OR_DEPARTMENT_OTHER): Payer: No Typology Code available for payment source | Admitting: Hematology and Oncology

## 2021-01-22 DIAGNOSIS — Z79899 Other long term (current) drug therapy: Secondary | ICD-10-CM | POA: Diagnosis not present

## 2021-01-22 DIAGNOSIS — C8307 Small cell B-cell lymphoma, spleen: Secondary | ICD-10-CM

## 2021-01-22 DIAGNOSIS — Z7189 Other specified counseling: Secondary | ICD-10-CM

## 2021-01-22 DIAGNOSIS — Z5111 Encounter for antineoplastic chemotherapy: Secondary | ICD-10-CM | POA: Insufficient documentation

## 2021-01-22 DIAGNOSIS — E1165 Type 2 diabetes mellitus with hyperglycemia: Secondary | ICD-10-CM | POA: Diagnosis not present

## 2021-01-22 DIAGNOSIS — L509 Urticaria, unspecified: Secondary | ICD-10-CM | POA: Diagnosis not present

## 2021-01-22 LAB — URIC ACID: Uric Acid, Serum: 5.7 mg/dL (ref 2.5–7.1)

## 2021-01-22 LAB — CBC WITH DIFFERENTIAL (CANCER CENTER ONLY)
Abs Immature Granulocytes: 0.02 10*3/uL (ref 0.00–0.07)
Basophils Absolute: 0.1 10*3/uL (ref 0.0–0.1)
Basophils Relative: 1 %
Eosinophils Absolute: 0.5 10*3/uL (ref 0.0–0.5)
Eosinophils Relative: 9 %
HCT: 39.7 % (ref 36.0–46.0)
Hemoglobin: 12.9 g/dL (ref 12.0–15.0)
Immature Granulocytes: 0 %
Lymphocytes Relative: 15 %
Lymphs Abs: 0.9 10*3/uL (ref 0.7–4.0)
MCH: 24.1 pg — ABNORMAL LOW (ref 26.0–34.0)
MCHC: 32.5 g/dL (ref 30.0–36.0)
MCV: 74.1 fL — ABNORMAL LOW (ref 80.0–100.0)
Monocytes Absolute: 0.5 10*3/uL (ref 0.1–1.0)
Monocytes Relative: 8 %
Neutro Abs: 3.9 10*3/uL (ref 1.7–7.7)
Neutrophils Relative %: 67 %
Platelet Count: 178 10*3/uL (ref 150–400)
RBC: 5.36 MIL/uL — ABNORMAL HIGH (ref 3.87–5.11)
RDW: 15.7 % — ABNORMAL HIGH (ref 11.5–15.5)
WBC Count: 5.8 10*3/uL (ref 4.0–10.5)
nRBC: 0 % (ref 0.0–0.2)

## 2021-01-22 LAB — CMP (CANCER CENTER ONLY)
ALT: 14 U/L (ref 0–44)
AST: 12 U/L — ABNORMAL LOW (ref 15–41)
Albumin: 3.7 g/dL (ref 3.5–5.0)
Alkaline Phosphatase: 93 U/L (ref 38–126)
Anion gap: 14 (ref 5–15)
BUN: 13 mg/dL (ref 6–20)
CO2: 23 mmol/L (ref 22–32)
Calcium: 8.7 mg/dL — ABNORMAL LOW (ref 8.9–10.3)
Chloride: 104 mmol/L (ref 98–111)
Creatinine: 0.78 mg/dL (ref 0.44–1.00)
GFR, Estimated: >60 mL/min (ref >60)
Glucose, Bld: 228 mg/dL — ABNORMAL HIGH (ref 70–99)
Potassium: 3.8 mmol/L (ref 3.5–5.1)
Sodium: 141 mmol/L (ref 135–145)
Total Bilirubin: 0.5 mg/dL (ref 0.3–1.2)
Total Protein: 6.5 g/dL (ref 6.5–8.1)

## 2021-01-22 MED ORDER — SODIUM CHLORIDE 0.9 % IV SOLN
60.0000 mg/m2 | Freq: Once | INTRAVENOUS | Status: AC
  Administered 2021-01-22: 150 mg via INTRAVENOUS
  Filled 2021-01-22: qty 6

## 2021-01-22 MED ORDER — PALONOSETRON HCL INJECTION 0.25 MG/5ML
0.2500 mg | Freq: Once | INTRAVENOUS | Status: AC
  Administered 2021-01-22: 0.25 mg via INTRAVENOUS

## 2021-01-22 MED ORDER — SODIUM CHLORIDE 0.9 % IV SOLN
Freq: Once | INTRAVENOUS | Status: AC
  Filled 2021-01-22: qty 250

## 2021-01-22 NOTE — Patient Instructions (Signed)
[facility] Discharge Instructions for Patients Receiving Chemotherapy  Today you received the following chemotherapy agents Bendamustine(Bendeka)  To help prevent nausea and vomiting after your treatment, we encourage you to take your nausea medication as directed.   If you develop nausea and vomiting that is not controlled by your nausea medication, call the clinic.   BELOW ARE SYMPTOMS THAT SHOULD BE REPORTED IMMEDIATELY:  *FEVER GREATER THAN 100.5 F  *CHILLS WITH OR WITHOUT FEVER  NAUSEA AND VOMITING THAT IS NOT CONTROLLED WITH YOUR NAUSEA MEDICATION  *UNUSUAL SHORTNESS OF BREATH  *UNUSUAL BRUISING OR BLEEDING  TENDERNESS IN MOUTH AND THROAT WITH OR WITHOUT PRESENCE OF ULCERS  *URINARY PROBLEMS  *BOWEL PROBLEMS  UNUSUAL RASH Items with * indicate a potential emergency and should be followed up as soon as possible.  Feel free to call the clinic should you have any questions or concerns. The clinic phone number is [phone].  Please show the Advance at check-in to the Emergency Department and triage nurse.

## 2021-01-22 NOTE — Assessment & Plan Note (Signed)
So far, she tolerated single agent Bendeka well without major side effects I encouraged the patient to continue to work on her risk factors I plan to repeat CT imaging after cycle 6 of treatment We will proceed with treatment without delay

## 2021-01-22 NOTE — Assessment & Plan Note (Signed)
The patient continues to struggle to maintain blood sugar control and weight I continue to encourage her with aggressive lifestyle changes, dietary modification and increase exercise as tolerated

## 2021-01-22 NOTE — Progress Notes (Signed)
[place] OFFICE PROGRESS NOTE  Patient Care Team: Lennie Odor, [place] as PCP - General (Nurse Practitioner) Heath Lark, MD as Consulting Physician (Hematology and Oncology)  ASSESSMENT & PLAN:  Splenic marginal zone b-cell lymphoma ([place]) So far, she tolerated single agent Bendeka well without major side effects I encouraged the patient to continue to work on her risk factors I plan to repeat CT imaging after cycle 6 of treatment We will proceed with treatment without delay  Diabetes mellitus type 2, uncontrolled ([place]) The patient continues to struggle to maintain blood sugar control and weight I continue to encourage her with aggressive lifestyle changes, dietary modification and increase exercise as tolerated  Hives of unknown origin She has some hives on her skin I recommend the patient not to scratch it if possible and just leave it to heal It does not look infected   No orders of the defined types were placed in this encounter.   All questions were answered. The patient knows to call the clinic with any problems, questions or concerns. The total time spent in the appointment was 25 minutes encounter with patients including review of chart and various tests results, discussions about plan of care and coordination of care plan   Heath Lark, MD 01/22/2021 9:09 AM  INTERVAL HISTORY: Please see below for problem oriented charting. She returns for chemotherapy and follow-up She is doing well She had no recent nausea or changes in bowel habits Her blood sugar is intermittently high She admits that she has not been as vigilant this past month about her dietary changes and exercise She complains of skin itching  SUMMARY OF ONCOLOGIC HISTORY: Oncology History  Splenic marginal zone b-cell lymphoma ([place])  01/11/2009 Initial Diagnosis   Splenic marginal zone b-cell lymphoma   06/06/2014 Bone Marrow Biopsy   Bone marrow aspirate and biopsy confirmed extensive  involvement by CD20 positive non-Hodgkin's lymphoma.   06/07/2014 Imaging   She had a PET scan which showed predominant splenic involvement.   06/16/2014 - 07/07/2014 Chemotherapy   She started on weekly rituximab.   06/16/2014 Adverse Reaction   She had infusion reaction, resolved with IV dexamethasone.   08/18/2014 Imaging   PET CT scan show resolution of hypermetabolic activity.   04/08/2016 Imaging   1. No evidence for aortic dissection or vascular injury. 2. Mild atherosclerotic changes within the abdominal aorta and branch vessels without aneurysm. 3. No significant adenopathy or evidence for recurrent lymphoma. 4. Stable mild splenomegaly. 5. 5 mm nodule in the right lower lobe demonstrates interval increase in size. No follow-up needed if patient is low-risk. Non-contrast chest CT can be considered in 12 months if patient is high-risk.  6. Degenerate changes at the SI joints bilaterally.   01/21/2018 Imaging   1. Persistent mild splenomegaly, similar to prior examinations. No other findings to suggest recurrent disease in the chest, abdomen or pelvis. 2. Aortic atherosclerosis, in addition to two vessel coronary artery disease. Please note that although the presence of coronary artery calcium documents the presence of coronary artery disease, the severity of this disease and any potential stenosis cannot be assessed on this non-gated CT examination. Assessment for potential risk factor modification, dietary therapy or pharmacologic therapy may be warranted, if clinically indicated.  3. Enlarging heterogeneously enhancing and partially calcified left thyroid lobe nodule. Further evaluation with nonemergent thyroid ultrasound is recommended in the near future to better evaluate this lesion and determine if there is a need for fine-needle aspiration. 4. Additional incidental  findings, as above.   09/28/2020 Imaging   1. Progressive splenomegaly. The splenic volume has almost doubled since  2019. 2. No adenopathy in the chest, abdomen or pelvis. 3. Stable fibroid uterus. 4. Moderate stool throughout the colon and down into the rectum suggesting constipation.   Aortic Atherosclerosis (ICD10-I70.0).   09/28/2020 Cancer Staging   Staging form: Hodgkin and Non-Hodgkin Lymphoma, AJCC 8th Edition - Clinical stage from 09/28/2020: Stage IE (Marginal zone lymphoma) - Signed by Heath Lark, MD on 09/28/2020   10/15/2020 -  Chemotherapy    Patient is on Treatment Plan: NON-HODGKINS LYMPHOMA RITUXIMAB D1 / BENDAMUSTINE D1,2 Q28D        REVIEW OF SYSTEMS:   Constitutional: Denies fevers, chills or abnormal weight loss Eyes: Denies blurriness of vision Ears, nose, mouth, throat, and face: Denies mucositis or sore throat Respiratory: Denies cough, dyspnea or wheezes Cardiovascular: Denies palpitation, chest discomfort or lower extremity swelling Gastrointestinal:  Denies nausea, heartburn or change in bowel habits Lymphatics: Denies new lymphadenopathy or easy bruising Neurological:Denies numbness, tingling or new weaknesses Behavioral/Psych: Mood is stable, no new changes  All other systems were reviewed with the patient and are negative.  I have reviewed the past medical history, past surgical history, social history and family history with the patient and they are unchanged from previous note.  ALLERGIES:  is allergic to erythromycin, rituxan [rituximab], lovastatin, and tolnaftate.  MEDICATIONS:  Current Outpatient Medications  Medication Sig Dispense Refill  . acetaminophen (TYLENOL) 500 MG tablet Take 1 tablet (500 mg total) by mouth every 8 (eight) hours as needed (pain). 30 tablet 0  . acyclovir (ZOVIRAX) 400 MG tablet TAKE 1 TABLET BY MOUTH EVERY DAY 90 tablet 1  . aspirin 81 MG chewable tablet Chew 1 tablet (81 mg total) by mouth daily. 30 tablet 0  . Bempedoic Acid (NEXLETOL) 180 MG TABS Take 1 tablet by mouth daily.    . fish oil-omega-3 fatty acids 1000 MG capsule  Take 1 g by mouth 2 (two) times daily.     . furosemide (LASIX) 40 MG tablet Take 40 mg by mouth as needed.    . hydrOXYzine (ATARAX/VISTARIL) 25 MG tablet Take 25 mg by mouth 3 (three) times daily.    Marland Kitchen losartan (COZAAR) 25 MG tablet Take 25 mg by mouth daily.    . metFORMIN (GLUCOPHAGE) 1000 MG tablet Take 1,000 mg by mouth 2 (two) times daily with a meal.      . metoprolol tartrate (LOPRESSOR) 100 MG tablet Take 1 tablet (100 mg total) by mouth 2 (two) times daily. PT OVERDUE FOR OV PLEASE CALL FOR APPT 60 tablet 0  . nitroGLYCERIN (NITROSTAT) 0.4 MG SL tablet Place 1 tablet (0.4 mg total) under the tongue every 5 (five) minutes as needed for chest pain. 30 tablet 0  . ondansetron (ZOFRAN) 8 MG tablet Take 1 tablet (8 mg total) by mouth every 8 (eight) hours as needed for refractory nausea / vomiting. Start on day 2 after bendamustine chemo. 30 tablet 1  . prochlorperazine (COMPAZINE) 10 MG tablet Take 1 tablet (10 mg total) by mouth every 6 (six) hours as needed (Nausea or vomiting). 30 tablet 1  . TRULICITY 4.5 ON/6.2XB SOPN Inject 4.5 mg into the skin once a week.    . valACYclovir (VALTREX) 500 MG tablet Take 500 mg by mouth 2 (two) times daily as needed. Takes for outbreaks only.     No current facility-administered medications for this visit.    PHYSICAL EXAMINATION: ECOG  PERFORMANCE STATUS: 1 - Symptomatic but completely ambulatory  Vitals:   01/22/21 0841 01/22/21 0842  BP: 133/79 118/79  Pulse: 96   Resp: 18   Temp: (!) 97 F (36.1 C)   SpO2: 100%    Filed Weights   01/22/21 0841  Weight: 248 lb 9.6 oz (112.8 kg)    GENERAL:alert, no distress and comfortable SKIN: skin color, texture, turgor are normal, no rashes or significant lesions.  Noted some skin changes resemble hives but not cellulitis or infection EYES: normal, Conjunctiva are pink and non-injected, sclera clear OROPHARYNX:no exudate, no erythema and lips, buccal mucosa, and tongue normal  NECK: supple,  thyroid normal size, non-tender, without nodularity LYMPH:  no palpable lymphadenopathy in the cervical, axillary or inguinal LUNGS: clear to auscultation and percussion with normal breathing effort HEART: regular rate & rhythm and no murmurs and no lower extremity edema ABDOMEN:abdomen soft, non-tender and normal bowel sounds Musculoskeletal:no cyanosis of digits and no clubbing  NEURO: alert & oriented x 3 with fluent speech, no focal motor/sensory deficits  LABORATORY DATA:  I have reviewed the data as listed    Component Value Date/Time   NA 141 01/22/2021 0823   NA 138 07/24/2017 1105   K 3.8 01/22/2021 0823   K 4.2 07/24/2017 1105   CL 104 01/22/2021 0823   CL 106 11/19/2012 0837   CO2 23 01/22/2021 0823   CO2 26 07/24/2017 1105   GLUCOSE 228 (H) 01/22/2021 0823   GLUCOSE 362 (H) 07/24/2017 1105   GLUCOSE 107 (H) 11/19/2012 0837   BUN 13 01/22/2021 0823   BUN 10.0 07/24/2017 1105   CREATININE 0.78 01/22/2021 0823   CREATININE 0.8 07/24/2017 1105   CALCIUM 8.7 (L) 01/22/2021 0823   CALCIUM 8.8 07/24/2017 1105   PROT 6.5 01/22/2021 0823   PROT 6.3 (L) 07/24/2017 1105   ALBUMIN 3.7 01/22/2021 0823   ALBUMIN 3.4 (L) 07/24/2017 1105   AST 12 (L) 01/22/2021 0823   AST 13 07/24/2017 1105   ALT 14 01/22/2021 0823   ALT 21 07/24/2017 1105   ALKPHOS 93 01/22/2021 0823   ALKPHOS 112 07/24/2017 1105   BILITOT 0.5 01/22/2021 0823   BILITOT 0.55 07/24/2017 1105   GFRNONAA >60 01/22/2021 0823   GFRAA >60 05/18/2020 0825   GFRAA >60 10/22/2018 0809    No results found for: SPEP, UPEP  Lab Results  Component Value Date   WBC 5.8 01/22/2021   NEUTROABS 3.9 01/22/2021   HGB 12.9 01/22/2021   HCT 39.7 01/22/2021   MCV 74.1 (L) 01/22/2021   PLT 178 01/22/2021      Chemistry      Component Value Date/Time   NA 141 01/22/2021 0823   NA 138 07/24/2017 1105   K 3.8 01/22/2021 0823   K 4.2 07/24/2017 1105   CL 104 01/22/2021 0823   CL 106 11/19/2012 0837   CO2 23  01/22/2021 0823   CO2 26 07/24/2017 1105   BUN 13 01/22/2021 0823   BUN 10.0 07/24/2017 1105   CREATININE 0.78 01/22/2021 0823   CREATININE 0.8 07/24/2017 1105      Component Value Date/Time   CALCIUM 8.7 (L) 01/22/2021 0823   CALCIUM 8.8 07/24/2017 1105   ALKPHOS 93 01/22/2021 0823   ALKPHOS 112 07/24/2017 1105   AST 12 (L) 01/22/2021 0823   AST 13 07/24/2017 1105   ALT 14 01/22/2021 0823   ALT 21 07/24/2017 1105   BILITOT 0.5 01/22/2021 0823   BILITOT 0.55 07/24/2017 1105

## 2021-01-22 NOTE — Assessment & Plan Note (Signed)
She has some hives on her skin I recommend the patient not to scratch it if possible and just leave it to heal It does not look infected

## 2021-01-23 ENCOUNTER — Inpatient Hospital Stay: Payer: No Typology Code available for payment source

## 2021-01-23 VITALS — BP 114/74 | HR 96 | Temp 98.1°F | Resp 18

## 2021-01-23 DIAGNOSIS — Z5111 Encounter for antineoplastic chemotherapy: Secondary | ICD-10-CM | POA: Diagnosis not present

## 2021-01-23 DIAGNOSIS — Z7189 Other specified counseling: Secondary | ICD-10-CM

## 2021-01-23 DIAGNOSIS — C8307 Small cell B-cell lymphoma, spleen: Secondary | ICD-10-CM

## 2021-01-23 MED ORDER — SODIUM CHLORIDE 0.9 % IV SOLN
60.0000 mg/m2 | Freq: Once | INTRAVENOUS | Status: AC
  Administered 2021-01-23: 150 mg via INTRAVENOUS
  Filled 2021-01-23: qty 6

## 2021-01-23 MED ORDER — SODIUM CHLORIDE 0.9 % IV SOLN
Freq: Once | INTRAVENOUS | Status: AC
  Filled 2021-01-23: qty 250

## 2021-01-23 NOTE — Patient Instructions (Signed)
[facility] Discharge Instructions for Patients Receiving Chemotherapy  Today you received the following chemotherapy agents Bendamustine(Bendeka)  To help prevent nausea and vomiting after your treatment, we encourage you to take your nausea medication as directed.   If you develop nausea and vomiting that is not controlled by your nausea medication, call the clinic.   BELOW ARE SYMPTOMS THAT SHOULD BE REPORTED IMMEDIATELY:  *FEVER GREATER THAN 100.5 F  *CHILLS WITH OR WITHOUT FEVER  NAUSEA AND VOMITING THAT IS NOT CONTROLLED WITH YOUR NAUSEA MEDICATION  *UNUSUAL SHORTNESS OF BREATH  *UNUSUAL BRUISING OR BLEEDING  TENDERNESS IN MOUTH AND THROAT WITH OR WITHOUT PRESENCE OF ULCERS  *URINARY PROBLEMS  *BOWEL PROBLEMS  UNUSUAL RASH Items with * indicate a potential emergency and should be followed up as soon as possible.  Feel free to call the clinic should you have any questions or concerns. The clinic phone number is [phone].  Please show the [place] at check-in to the Emergency Department and triage nurse.

## 2021-02-19 ENCOUNTER — Other Ambulatory Visit: Payer: Self-pay

## 2021-02-19 ENCOUNTER — Inpatient Hospital Stay (HOSPITAL_BASED_OUTPATIENT_CLINIC_OR_DEPARTMENT_OTHER): Payer: No Typology Code available for payment source | Admitting: Hematology and Oncology

## 2021-02-19 ENCOUNTER — Encounter: Payer: Self-pay | Admitting: Hematology and Oncology

## 2021-02-19 ENCOUNTER — Other Ambulatory Visit: Payer: Self-pay | Admitting: Hematology and Oncology

## 2021-02-19 ENCOUNTER — Inpatient Hospital Stay: Payer: No Typology Code available for payment source

## 2021-02-19 ENCOUNTER — Inpatient Hospital Stay: Payer: No Typology Code available for payment source | Attending: Hematology and Oncology

## 2021-02-19 DIAGNOSIS — E1165 Type 2 diabetes mellitus with hyperglycemia: Secondary | ICD-10-CM | POA: Insufficient documentation

## 2021-02-19 DIAGNOSIS — Z7189 Other specified counseling: Secondary | ICD-10-CM

## 2021-02-19 DIAGNOSIS — Z79899 Other long term (current) drug therapy: Secondary | ICD-10-CM | POA: Insufficient documentation

## 2021-02-19 DIAGNOSIS — C8307 Small cell B-cell lymphoma, spleen: Secondary | ICD-10-CM

## 2021-02-19 DIAGNOSIS — Z5111 Encounter for antineoplastic chemotherapy: Secondary | ICD-10-CM | POA: Insufficient documentation

## 2021-02-19 DIAGNOSIS — L509 Urticaria, unspecified: Secondary | ICD-10-CM | POA: Diagnosis not present

## 2021-02-19 DIAGNOSIS — D500 Iron deficiency anemia secondary to blood loss (chronic): Secondary | ICD-10-CM

## 2021-02-19 LAB — CBC WITH DIFFERENTIAL (CANCER CENTER ONLY)
Abs Immature Granulocytes: 0.04 10*3/uL (ref 0.00–0.07)
Basophils Absolute: 0.1 10*3/uL (ref 0.0–0.1)
Basophils Relative: 1 %
Eosinophils Absolute: 0.2 10*3/uL (ref 0.0–0.5)
Eosinophils Relative: 4 %
HCT: 41.5 % (ref 36.0–46.0)
Hemoglobin: 14.3 g/dL (ref 12.0–15.0)
Immature Granulocytes: 1 %
Lymphocytes Relative: 8 %
Lymphs Abs: 0.6 10*3/uL — ABNORMAL LOW (ref 0.7–4.0)
MCH: 25.3 pg — ABNORMAL LOW (ref 26.0–34.0)
MCHC: 34.5 g/dL (ref 30.0–36.0)
MCV: 73.3 fL — ABNORMAL LOW (ref 80.0–100.0)
Monocytes Absolute: 0.5 10*3/uL (ref 0.1–1.0)
Monocytes Relative: 7 %
Neutro Abs: 5.3 10*3/uL (ref 1.7–7.7)
Neutrophils Relative %: 79 %
Platelet Count: 182 10*3/uL (ref 150–400)
RBC: 5.66 MIL/uL — ABNORMAL HIGH (ref 3.87–5.11)
RDW: 16.6 % — ABNORMAL HIGH (ref 11.5–15.5)
WBC Count: 6.7 10*3/uL (ref 4.0–10.5)
nRBC: 0 % (ref 0.0–0.2)

## 2021-02-19 LAB — CMP (CANCER CENTER ONLY)
ALT: 22 U/L (ref 0–44)
AST: 15 U/L (ref 15–41)
Albumin: 3.8 g/dL (ref 3.5–5.0)
Alkaline Phosphatase: 99 U/L (ref 38–126)
Anion gap: 12 (ref 5–15)
BUN: 16 mg/dL (ref 6–20)
CO2: 25 mmol/L (ref 22–32)
Calcium: 9.3 mg/dL (ref 8.9–10.3)
Chloride: 101 mmol/L (ref 98–111)
Creatinine: 0.85 mg/dL (ref 0.44–1.00)
GFR, Estimated: >60 mL/min (ref >60)
Glucose, Bld: 306 mg/dL — ABNORMAL HIGH (ref 70–99)
Potassium: 3.6 mmol/L (ref 3.5–5.1)
Sodium: 138 mmol/L (ref 135–145)
Total Bilirubin: 0.8 mg/dL (ref 0.3–1.2)
Total Protein: 6.8 g/dL (ref 6.5–8.1)

## 2021-02-19 MED ORDER — INSULIN ASPART 100 UNIT/ML ~~LOC~~ SOLN
10.0000 [IU] | Freq: Once | SUBCUTANEOUS | Status: AC
Start: 2021-02-19 — End: 2021-02-19
  Administered 2021-02-19: 10 [IU] via SUBCUTANEOUS
  Filled 2021-02-19: qty 0.1

## 2021-02-19 MED ORDER — PALONOSETRON HCL INJECTION 0.25 MG/5ML
0.2500 mg | Freq: Once | INTRAVENOUS | Status: AC
Start: 2021-02-19 — End: 2021-02-19
  Administered 2021-02-19: 0.25 mg via INTRAVENOUS

## 2021-02-19 MED ORDER — SODIUM CHLORIDE 0.9 % IV SOLN
60.0000 mg/m2 | Freq: Once | INTRAVENOUS | Status: AC
  Administered 2021-02-19: 150 mg via INTRAVENOUS
  Filled 2021-02-19: qty 6

## 2021-02-19 MED ORDER — SODIUM CHLORIDE 0.9 % IV SOLN
Freq: Once | INTRAVENOUS | Status: AC
Start: 2021-02-19 — End: 2021-02-19
  Filled 2021-02-19: qty 250

## 2021-02-19 MED ORDER — INSULIN ASPART 100 UNIT/ML IJ SOLN
INTRAMUSCULAR | Status: AC
  Filled 2021-02-19: qty 1

## 2021-02-19 NOTE — Assessment & Plan Note (Signed)
The patient continues to struggle to maintain blood sugar control and weight I continue to encourage her with aggressive lifestyle changes, dietary modification and increase exercise as tolerated She will receive 1 dose of insulin today

## 2021-02-19 NOTE — Progress Notes (Signed)
[place] OFFICE PROGRESS NOTE  Patient Care Team: Lennie Odor, [place] as PCP - General (Nurse Practitioner) Heath Lark, MD as Consulting Physician (Hematology and Oncology)  ASSESSMENT & PLAN:  Splenic marginal zone b-cell lymphoma ([place]) So far, she tolerated single agent Bendeka well without major side effects I encouraged the patient to continue to work on her risk factors I plan to repeat CT imaging after cycle 6 of treatment We will proceed with treatment without delay  Diabetes mellitus type 2, uncontrolled ([place]) The patient continues to struggle to maintain blood sugar control and weight I continue to encourage her with aggressive lifestyle changes, dietary modification and increase exercise as tolerated She will receive 1 dose of insulin today  Hives of unknown origin She has some hives on her skin I recommend the patient not to scratch it if possible and just leave it to heal It does not look infected Observe closely for now   No orders of the defined types were placed in this encounter.   All questions were answered. The patient knows to call the clinic with any problems, questions or concerns. The total time spent in the appointment was 20 minutes encounter with patients including review of chart and various tests results, discussions about plan of care and coordination of care plan   Heath Lark, MD 02/19/2021 9:47 AM  INTERVAL HISTORY: Please see below for problem oriented charting. She returns for chemotherapy and follow-up She continues to have some hives that comes and goes Denies nausea She is still struggling to get her weight down She complains of chronic fatigue No side effects from chemo such as nausea or changes in bowel habits  SUMMARY OF ONCOLOGIC HISTORY: Oncology History  Splenic marginal zone b-cell lymphoma ([place])  01/11/2009 Initial Diagnosis   Splenic marginal zone b-cell lymphoma   06/06/2014 Bone Marrow Biopsy   Bone marrow  aspirate and biopsy confirmed extensive involvement by CD20 positive non-Hodgkin's lymphoma.   06/07/2014 Imaging   She had a PET scan which showed predominant splenic involvement.   06/16/2014 - 07/07/2014 Chemotherapy   She started on weekly rituximab.   06/16/2014 Adverse Reaction   She had infusion reaction, resolved with IV dexamethasone.   08/18/2014 Imaging   PET CT scan show resolution of hypermetabolic activity.   04/08/2016 Imaging   1. No evidence for aortic dissection or vascular injury. 2. Mild atherosclerotic changes within the abdominal aorta and branch vessels without aneurysm. 3. No significant adenopathy or evidence for recurrent lymphoma. 4. Stable mild splenomegaly. 5. 5 mm nodule in the right lower lobe demonstrates interval increase in size. No follow-up needed if patient is low-risk. Non-contrast chest CT can be considered in 12 months if patient is high-risk.  6. Degenerate changes at the SI joints bilaterally.   01/21/2018 Imaging   1. Persistent mild splenomegaly, similar to prior examinations. No other findings to suggest recurrent disease in the chest, abdomen or pelvis. 2. Aortic atherosclerosis, in addition to two vessel coronary artery disease. Please note that although the presence of coronary artery calcium documents the presence of coronary artery disease, the severity of this disease and any potential stenosis cannot be assessed on this non-gated CT examination. Assessment for potential risk factor modification, dietary therapy or pharmacologic therapy may be warranted, if clinically indicated.  3. Enlarging heterogeneously enhancing and partially calcified left thyroid lobe nodule. Further evaluation with nonemergent thyroid ultrasound is recommended in the near future to better evaluate this lesion and determine if there is  a need for fine-needle aspiration. 4. Additional incidental findings, as above.   09/28/2020 Imaging   1. Progressive splenomegaly. The  splenic volume has almost doubled since 2019. 2. No adenopathy in the chest, abdomen or pelvis. 3. Stable fibroid uterus. 4. Moderate stool throughout the colon and down into the rectum suggesting constipation.   Aortic Atherosclerosis (ICD10-I70.0).   09/28/2020 Cancer Staging   Staging form: Hodgkin and Non-Hodgkin Lymphoma, AJCC 8th Edition - Clinical stage from 09/28/2020: Stage IE (Marginal zone lymphoma) - Signed by Heath Lark, MD on 09/28/2020   10/15/2020 -  Chemotherapy    Patient is on Treatment Plan: NON-HODGKINS LYMPHOMA RITUXIMAB D1 / BENDAMUSTINE D1,2 Q28D        REVIEW OF SYSTEMS:   Constitutional: Denies fevers, chills or abnormal weight loss Eyes: Denies blurriness of vision Ears, nose, mouth, throat, and face: Denies mucositis or sore throat Respiratory: Denies cough, dyspnea or wheezes Cardiovascular: Denies palpitation, chest discomfort or lower extremity swelling Gastrointestinal:  Denies nausea, heartburn or change in bowel habits Lymphatics: Denies new lymphadenopathy or easy bruising Neurological:Denies numbness, tingling or new weaknesses Behavioral/Psych: Mood is stable, no new changes  All other systems were reviewed with the patient and are negative.  I have reviewed the past medical history, past surgical history, social history and family history with the patient and they are unchanged from previous note.  ALLERGIES:  is allergic to erythromycin, rituxan [rituximab], lovastatin, and tolnaftate.  MEDICATIONS:  Current Outpatient Medications  Medication Sig Dispense Refill  . acetaminophen (TYLENOL) 500 MG tablet Take 1 tablet (500 mg total) by mouth every 8 (eight) hours as needed (pain). 30 tablet 0  . acyclovir (ZOVIRAX) 400 MG tablet TAKE 1 TABLET BY MOUTH EVERY DAY 90 tablet 1  . aspirin 81 MG chewable tablet Chew 1 tablet (81 mg total) by mouth daily. 30 tablet 0  . Bempedoic Acid (NEXLETOL) 180 MG TABS Take 1 tablet by mouth daily.    . fish  oil-omega-3 fatty acids 1000 MG capsule Take 1 g by mouth 2 (two) times daily.     . furosemide (LASIX) 40 MG tablet Take 40 mg by mouth as needed.    . hydrOXYzine (ATARAX/VISTARIL) 25 MG tablet Take 25 mg by mouth 3 (three) times daily.    Marland Kitchen losartan (COZAAR) 25 MG tablet Take 25 mg by mouth daily.    . metFORMIN (GLUCOPHAGE) 1000 MG tablet Take 1,000 mg by mouth 2 (two) times daily with a meal.      . metoprolol tartrate (LOPRESSOR) 100 MG tablet Take 1 tablet (100 mg total) by mouth 2 (two) times daily. PT OVERDUE FOR OV PLEASE CALL FOR APPT 60 tablet 0  . nitroGLYCERIN (NITROSTAT) 0.4 MG SL tablet Place 1 tablet (0.4 mg total) under the tongue every 5 (five) minutes as needed for chest pain. 30 tablet 0  . ondansetron (ZOFRAN) 8 MG tablet Take 1 tablet (8 mg total) by mouth every 8 (eight) hours as needed for refractory nausea / vomiting. Start on day 2 after bendamustine chemo. 30 tablet 1  . prochlorperazine (COMPAZINE) 10 MG tablet Take 1 tablet (10 mg total) by mouth every 6 (six) hours as needed (Nausea or vomiting). 30 tablet 1  . TRULICITY 4.5 MC/9.4BS SOPN Inject 4.5 mg into the skin once a week.    . valACYclovir (VALTREX) 500 MG tablet Take 500 mg by mouth 2 (two) times daily as needed. Takes for outbreaks only.     No current facility-administered medications for  this visit.   Facility-Administered Medications Ordered in Other Visits  Medication Dose Route Frequency Provider Last Rate Last Admin  . 0.9 %  sodium chloride infusion   Intravenous Once Alvy Bimler, Wayburn Shaler, MD      . bendamustine (BENDEKA) 150 mg in sodium chloride 0.9 % 50 mL (2.6786 mg/mL) chemo infusion  60 mg/m2 (Treatment Plan Recorded) Intravenous Once Alvy Bimler, Azul Coffie, MD      . palonosetron (ALOXI) injection 0.25 mg  0.25 mg Intravenous Once Alvy Bimler, Lowell Makara, MD        PHYSICAL EXAMINATION: ECOG PERFORMANCE STATUS: 0 - Asymptomatic  Vitals:   02/19/21 0853  BP: 110/84  Pulse: 100  Resp: 17  Temp: (!) 97.5 F (36.4 C)   SpO2: 100%   Filed Weights   02/19/21 0853  Weight: 252 lb 3.2 oz (114.4 kg)    GENERAL:alert, no distress and comfortable SKIN: Noted some healed skin scab EYES: normal, Conjunctiva are pink and non-injected, sclera clear OROPHARYNX:no exudate, no erythema and lips, buccal mucosa, and tongue normal  NECK: supple, thyroid normal size, non-tender, without nodularity LYMPH:  no palpable lymphadenopathy in the cervical, axillary or inguinal LUNGS: clear to auscultation and percussion with normal breathing effort HEART: regular rate & rhythm and no murmurs and no lower extremity edema ABDOMEN:abdomen soft, non-tender and normal bowel sounds Musculoskeletal:no cyanosis of digits and no clubbing  NEURO: alert & oriented x 3 with fluent speech, no focal motor/sensory deficits  LABORATORY DATA:  I have reviewed the data as listed    Component Value Date/Time   NA 138 02/19/2021 0829   NA 138 07/24/2017 1105   K 3.6 02/19/2021 0829   K 4.2 07/24/2017 1105   CL 101 02/19/2021 0829   CL 106 11/19/2012 0837   CO2 25 02/19/2021 0829   CO2 26 07/24/2017 1105   GLUCOSE 306 (H) 02/19/2021 0829   GLUCOSE 362 (H) 07/24/2017 1105   GLUCOSE 107 (H) 11/19/2012 0837   BUN 16 02/19/2021 0829   BUN 10.0 07/24/2017 1105   CREATININE 0.85 02/19/2021 0829   CREATININE 0.8 07/24/2017 1105   CALCIUM 9.3 02/19/2021 0829   CALCIUM 8.8 07/24/2017 1105   PROT 6.8 02/19/2021 0829   PROT 6.3 (L) 07/24/2017 1105   ALBUMIN 3.8 02/19/2021 0829   ALBUMIN 3.4 (L) 07/24/2017 1105   AST 15 02/19/2021 0829   AST 13 07/24/2017 1105   ALT 22 02/19/2021 0829   ALT 21 07/24/2017 1105   ALKPHOS 99 02/19/2021 0829   ALKPHOS 112 07/24/2017 1105   BILITOT 0.8 02/19/2021 0829   BILITOT 0.55 07/24/2017 1105   GFRNONAA >60 02/19/2021 0829   GFRAA >60 05/18/2020 0825   GFRAA >60 10/22/2018 0809    No results found for: SPEP, UPEP  Lab Results  Component Value Date   WBC 6.7 02/19/2021   NEUTROABS 5.3  02/19/2021   HGB 14.3 02/19/2021   HCT 41.5 02/19/2021   MCV 73.3 (L) 02/19/2021   PLT 182 02/19/2021      Chemistry      Component Value Date/Time   NA 138 02/19/2021 0829   NA 138 07/24/2017 1105   K 3.6 02/19/2021 0829   K 4.2 07/24/2017 1105   CL 101 02/19/2021 0829   CL 106 11/19/2012 0837   CO2 25 02/19/2021 0829   CO2 26 07/24/2017 1105   BUN 16 02/19/2021 0829   BUN 10.0 07/24/2017 1105   CREATININE 0.85 02/19/2021 0829   CREATININE 0.8 07/24/2017 1105  Component Value Date/Time   CALCIUM 9.3 02/19/2021 0829   CALCIUM 8.8 07/24/2017 1105   ALKPHOS 99 02/19/2021 0829   ALKPHOS 112 07/24/2017 1105   AST 15 02/19/2021 0829   AST 13 07/24/2017 1105   ALT 22 02/19/2021 0829   ALT 21 07/24/2017 1105   BILITOT 0.8 02/19/2021 0829   BILITOT 0.55 07/24/2017 1105

## 2021-02-19 NOTE — Assessment & Plan Note (Signed)
She has some hives on her skin I recommend the patient not to scratch it if possible and just leave it to heal It does not look infected Observe closely for now

## 2021-02-19 NOTE — Assessment & Plan Note (Signed)
So far, she tolerated single agent Bendeka well without major side effects I encouraged the patient to continue to work on her risk factors I plan to repeat CT imaging after cycle 6 of treatment We will proceed with treatment without delay

## 2021-02-19 NOTE — Patient Instructions (Signed)
[facility]   Discharge Instructions: Thank you for choosing [facility] to provide your oncology and hematology care.   If you have a lab appointment with the Cancer Center, please go directly to the Cancer Center and check in at the registration area.   Wear comfortable clothing and clothing appropriate for easy access to any Portacath or PICC line.   We strive to give you quality time with your provider. You may need to reschedule your appointment if you arrive late (15 or more minutes).  Arriving late affects you and other patients whose appointments are after yours.  Also, if you miss three or more appointments without notifying the office, you may be dismissed from the clinic at the provider's discretion.      For prescription refill requests, have your pharmacy contact our office and allow 72 hours for refills to be completed.    Today you received the following chemotherapy and/or immunotherapy agents: bendamustine      To help prevent nausea and vomiting after your treatment, we encourage you to take your nausea medication as directed.  BELOW ARE SYMPTOMS THAT SHOULD BE REPORTED IMMEDIATELY: *FEVER GREATER THAN 100.4 F (38 C) OR HIGHER *CHILLS OR SWEATING *NAUSEA AND VOMITING THAT IS NOT CONTROLLED WITH YOUR NAUSEA MEDICATION *UNUSUAL SHORTNESS OF BREATH *UNUSUAL BRUISING OR BLEEDING *URINARY PROBLEMS (pain or burning when urinating, or frequent urination) *BOWEL PROBLEMS (unusual diarrhea, constipation, pain near the anus) TENDERNESS IN MOUTH AND THROAT WITH OR WITHOUT PRESENCE OF ULCERS (sore throat, sores in mouth, or a toothache) UNUSUAL RASH, SWELLING OR PAIN  UNUSUAL VAGINAL DISCHARGE OR ITCHING   Items with * indicate a potential emergency and should be followed up as soon as possible or go to the Emergency Department if any problems should occur.  Please show the CHEMOTHERAPY ALERT CARD or IMMUNOTHERAPY ALERT CARD at  check-in to the Emergency Department and triage nurse.  Should you have questions after your visit or need to cancel or reschedule your appointment, please contact [facility]  Dept: [phone]  and follow the prompts.  Office hours are 8:00 a.m. to 4:30 p.m. Monday - Friday. Please note that voicemails left after 4:00 p.m. may not be returned until the following business day.  We are closed weekends and major holidays. You have access to a nurse at all times for urgent questions. Please call the main number to the clinic Dept: [phone] and follow the prompts.   For any non-urgent questions, you may also contact your provider using MyChart. We now offer e-Visits for anyone 18 and older to request care online for non-urgent symptoms. For details visit [email or web address].   Also download the MyChart app! Go to the app store, search "MyChart", open the app, select [facility], and log in with your MyChart username and password.  Due to Covid, a mask is required upon entering the hospital/clinic. If you do not have a mask, one will be given to you upon arrival. For doctor visits, patients may have 1 support person aged 18 or older with them. For treatment visits, patients cannot have anyone with them due to current Covid guidelines and our immunocompromised population.   

## 2021-02-20 ENCOUNTER — Inpatient Hospital Stay: Payer: No Typology Code available for payment source

## 2021-02-20 VITALS — BP 119/71 | HR 102 | Temp 97.5°F | Resp 17

## 2021-02-20 DIAGNOSIS — Z5111 Encounter for antineoplastic chemotherapy: Secondary | ICD-10-CM | POA: Diagnosis not present

## 2021-02-20 DIAGNOSIS — Z7189 Other specified counseling: Secondary | ICD-10-CM

## 2021-02-20 DIAGNOSIS — C8307 Small cell B-cell lymphoma, spleen: Secondary | ICD-10-CM

## 2021-02-20 MED ORDER — SODIUM CHLORIDE 0.9% FLUSH
10.0000 mL | INTRAVENOUS | Status: DC | PRN
  Filled 2021-02-20: qty 10

## 2021-02-20 MED ORDER — SODIUM CHLORIDE 0.9 % IV SOLN
Freq: Once | INTRAVENOUS | Status: AC
Start: 2021-02-20 — End: 2021-02-20
  Filled 2021-02-20: qty 250

## 2021-02-20 MED ORDER — SODIUM CHLORIDE 0.9 % IV SOLN
60.0000 mg/m2 | Freq: Once | INTRAVENOUS | Status: AC
  Administered 2021-02-20: 150 mg via INTRAVENOUS
  Filled 2021-02-20: qty 6

## 2021-02-20 MED ORDER — HEPARIN SOD (PORK) LOCK FLUSH 100 UNIT/ML IV SOLN
500.0000 [IU] | Freq: Once | INTRAVENOUS | Status: DC | PRN
  Filled 2021-02-20: qty 5

## 2021-02-20 NOTE — Progress Notes (Signed)
Okay to treat with HR 102 per Dr. Alvy Bimler.

## 2021-03-15 ENCOUNTER — Telehealth: Payer: Self-pay | Admitting: Hematology and Oncology

## 2021-03-15 NOTE — Telephone Encounter (Signed)
R/s appts per 6/2 sch msg. Called pt, no answer and no voicemail. Appts were moved to last week in June per pt's request. MD approved. Mailed updated calendar to pt.

## 2021-03-19 ENCOUNTER — Ambulatory Visit: Payer: No Typology Code available for payment source

## 2021-03-19 ENCOUNTER — Other Ambulatory Visit: Payer: No Typology Code available for payment source

## 2021-03-19 ENCOUNTER — Ambulatory Visit: Payer: No Typology Code available for payment source | Admitting: Hematology and Oncology

## 2021-03-20 ENCOUNTER — Ambulatory Visit: Payer: No Typology Code available for payment source

## 2021-04-02 ENCOUNTER — Other Ambulatory Visit: Payer: Self-pay | Admitting: Hematology and Oncology

## 2021-04-09 ENCOUNTER — Inpatient Hospital Stay: Payer: No Typology Code available for payment source

## 2021-04-09 ENCOUNTER — Telehealth: Payer: Self-pay | Admitting: Hematology and Oncology

## 2021-04-09 ENCOUNTER — Inpatient Hospital Stay (HOSPITAL_BASED_OUTPATIENT_CLINIC_OR_DEPARTMENT_OTHER): Payer: No Typology Code available for payment source | Admitting: Hematology and Oncology

## 2021-04-09 ENCOUNTER — Other Ambulatory Visit: Payer: Self-pay

## 2021-04-09 ENCOUNTER — Encounter: Payer: Self-pay | Admitting: Hematology and Oncology

## 2021-04-09 ENCOUNTER — Inpatient Hospital Stay: Payer: No Typology Code available for payment source | Attending: Hematology and Oncology

## 2021-04-09 VITALS — BP 132/74 | HR 111 | Temp 97.8°F | Resp 18 | Ht 67.0 in | Wt 258.2 lb

## 2021-04-09 DIAGNOSIS — E1165 Type 2 diabetes mellitus with hyperglycemia: Secondary | ICD-10-CM | POA: Diagnosis not present

## 2021-04-09 DIAGNOSIS — R000 Tachycardia, unspecified: Secondary | ICD-10-CM | POA: Diagnosis not present

## 2021-04-09 DIAGNOSIS — C8307 Small cell B-cell lymphoma, spleen: Secondary | ICD-10-CM | POA: Diagnosis not present

## 2021-04-09 DIAGNOSIS — Z79899 Other long term (current) drug therapy: Secondary | ICD-10-CM | POA: Diagnosis not present

## 2021-04-09 DIAGNOSIS — Z7189 Other specified counseling: Secondary | ICD-10-CM

## 2021-04-09 DIAGNOSIS — Z5111 Encounter for antineoplastic chemotherapy: Secondary | ICD-10-CM | POA: Insufficient documentation

## 2021-04-09 LAB — CBC WITH DIFFERENTIAL (CANCER CENTER ONLY)
Abs Immature Granulocytes: 0.01 10*3/uL (ref 0.00–0.07)
Basophils Absolute: 0 10*3/uL (ref 0.0–0.1)
Basophils Relative: 1 %
Eosinophils Absolute: 0.1 10*3/uL (ref 0.0–0.5)
Eosinophils Relative: 3 %
HCT: 36.3 % (ref 36.0–46.0)
Hemoglobin: 12.6 g/dL (ref 12.0–15.0)
Immature Granulocytes: 0 %
Lymphocytes Relative: 10 %
Lymphs Abs: 0.4 10*3/uL — ABNORMAL LOW (ref 0.7–4.0)
MCH: 27.2 pg (ref 26.0–34.0)
MCHC: 34.7 g/dL (ref 30.0–36.0)
MCV: 78.4 fL — ABNORMAL LOW (ref 80.0–100.0)
Monocytes Absolute: 0.4 10*3/uL (ref 0.1–1.0)
Monocytes Relative: 9 %
Neutro Abs: 3.2 10*3/uL (ref 1.7–7.7)
Neutrophils Relative %: 77 %
Platelet Count: 175 10*3/uL (ref 150–400)
RBC: 4.63 MIL/uL (ref 3.87–5.11)
RDW: 15 % (ref 11.5–15.5)
WBC Count: 4.2 10*3/uL (ref 4.0–10.5)
nRBC: 0 % (ref 0.0–0.2)

## 2021-04-09 LAB — CMP (CANCER CENTER ONLY)
ALT: 26 U/L (ref 0–44)
AST: 20 U/L (ref 15–41)
Albumin: 3.4 g/dL — ABNORMAL LOW (ref 3.5–5.0)
Alkaline Phosphatase: 88 U/L (ref 38–126)
Anion gap: 10 (ref 5–15)
BUN: 9 mg/dL (ref 6–20)
CO2: 23 mmol/L (ref 22–32)
Calcium: 9.1 mg/dL (ref 8.9–10.3)
Chloride: 107 mmol/L (ref 98–111)
Creatinine: 0.81 mg/dL (ref 0.44–1.00)
GFR, Estimated: >60 mL/min (ref >60)
Glucose, Bld: 259 mg/dL — ABNORMAL HIGH (ref 70–99)
Potassium: 3.8 mmol/L (ref 3.5–5.1)
Sodium: 140 mmol/L (ref 135–145)
Total Bilirubin: 0.6 mg/dL (ref 0.3–1.2)
Total Protein: 6.2 g/dL — ABNORMAL LOW (ref 6.5–8.1)

## 2021-04-09 MED ORDER — SODIUM CHLORIDE 0.9 % IV SOLN
Freq: Once | INTRAVENOUS | Status: AC
  Filled 2021-04-09: qty 250

## 2021-04-09 MED ORDER — PALONOSETRON HCL INJECTION 0.25 MG/5ML
0.2500 mg | Freq: Once | INTRAVENOUS | Status: AC
  Administered 2021-04-09: 0.25 mg via INTRAVENOUS

## 2021-04-09 MED ORDER — PALONOSETRON HCL INJECTION 0.25 MG/5ML
INTRAVENOUS | Status: AC
  Filled 2021-04-09: qty 5

## 2021-04-09 MED ORDER — SODIUM CHLORIDE 0.9 % IV SOLN
60.0000 mg/m2 | Freq: Once | INTRAVENOUS | Status: AC
  Administered 2021-04-09: 150 mg via INTRAVENOUS
  Filled 2021-04-09: qty 6

## 2021-04-09 NOTE — Telephone Encounter (Signed)
Scheduled appointment per 06/28 los. Patient is aware.

## 2021-04-09 NOTE — Assessment & Plan Note (Signed)
So far, she tolerated single agent Bendeka well without major side effects I encouraged the patient to continue to work on her risk factors I plan to repeat CT imaging after cycle 6 of treatment We will proceed with treatment without delay

## 2021-04-09 NOTE — Progress Notes (Signed)
[facility] OFFICE PROGRESS NOTE  Patient Care Team: Lennie Odor, [place] as PCP - General (Nurse Practitioner) Heath Lark, MD as Consulting Physician (Hematology and Oncology)  ASSESSMENT & PLAN:  Splenic marginal zone b-cell lymphoma ([place]) So far, she tolerated single agent Bendeka well without major side effects I encouraged the patient to continue to work on her risk factors I plan to repeat CT imaging after cycle 6 of treatment We will proceed with treatment without delay  Diabetes mellitus type 2, uncontrolled ([place]) The patient continues to struggle to maintain blood sugar control and weight I continue to encourage her with aggressive lifestyle changes, dietary modification and increase exercise as tolerated   Tachycardia with heart rate 100-120 beats per minute She has tachycardia likely due to slight dehydration Again, we discussed importance of risk factor modification and increase oral fluids as tolerated I gave her a sheet of paper to document her oral intake over the next month and reminded her to bring it in so that I could review her oral intake  Orders Placed This Encounter  Procedures   CT Abdomen Pelvis Wo Contrast    Standing Status:   Future    Standing Expiration Date:   04/09/2022    Order Specific Question:   Is patient pregnant?    Answer:   No    Order Specific Question:   Preferred imaging location?    Answer:   [facility]    Order Specific Question:   Is Oral Contrast requested for this exam?    Answer:   Yes, Per Radiology protocol    All questions were answered. The patient knows to call the clinic with any problems, questions or concerns. The total time spent in the appointment was 20 minutes encounter with patients including review of chart and various tests results, discussions about plan of care and coordination of care plan   Heath Lark, MD 04/09/2021 10:16 AM  INTERVAL HISTORY: Please see below for problem oriented  charting. She returns for further follow-up and chemotherapy Since last time I saw her, she was not able to get her blood sugar well controlled and she has gained some weight No side effects from treatment such as nausea No recent infection  SUMMARY OF ONCOLOGIC HISTORY: Oncology History  Splenic marginal zone b-cell lymphoma ([place])  01/11/2009 Initial Diagnosis   Splenic marginal zone b-cell lymphoma    06/06/2014 Bone Marrow Biopsy   Bone marrow aspirate and biopsy confirmed extensive involvement by CD20 positive non-Hodgkin's lymphoma.    06/07/2014 Imaging   She had a PET scan which showed predominant splenic involvement.    06/16/2014 - 07/07/2014 Chemotherapy   She started on weekly rituximab.    06/16/2014 Adverse Reaction   She had infusion reaction, resolved with IV dexamethasone.    08/18/2014 Imaging   PET CT scan show resolution of hypermetabolic activity.    04/08/2016 Imaging   1. No evidence for aortic dissection or vascular injury. 2. Mild atherosclerotic changes within the abdominal aorta and branch vessels without aneurysm. 3. No significant adenopathy or evidence for recurrent lymphoma. 4. Stable mild splenomegaly. 5. 5 mm nodule in the right lower lobe demonstrates interval increase in size. No follow-up needed if patient is low-risk. Non-contrast chest CT can be considered in 12 months if patient is high-risk.  6. Degenerate changes at the SI joints bilaterally.    01/21/2018 Imaging   1. Persistent mild splenomegaly, similar to prior examinations. No other findings to suggest recurrent  disease in the chest, abdomen or pelvis. 2. Aortic atherosclerosis, in addition to two vessel coronary artery disease. Please note that although the presence of coronary artery calcium documents the presence of coronary artery disease, the severity of this disease and any potential stenosis cannot be assessed on this non-gated CT examination. Assessment for potential risk factor  modification, dietary therapy or pharmacologic therapy may be warranted, if clinically indicated.  3. Enlarging heterogeneously enhancing and partially calcified left thyroid lobe nodule. Further evaluation with nonemergent thyroid ultrasound is recommended in the near future to better evaluate this lesion and determine if there is a need for fine-needle aspiration. 4. Additional incidental findings, as above.    09/28/2020 Imaging   1. Progressive splenomegaly. The splenic volume has almost doubled since 2019. 2. No adenopathy in the chest, abdomen or pelvis. 3. Stable fibroid uterus. 4. Moderate stool throughout the colon and down into the rectum suggesting constipation.   Aortic Atherosclerosis (ICD10-I70.0).   09/28/2020 Cancer Staging   Staging form: Hodgkin and Non-Hodgkin Lymphoma, AJCC 8th Edition - Clinical stage from 09/28/2020: Stage IE (Marginal zone lymphoma) - Signed by Heath Lark, MD on 09/28/2020    10/15/2020 -  Chemotherapy    Patient is on Treatment Plan: NON-HODGKINS LYMPHOMA RITUXIMAB D1 / BENDAMUSTINE D1,2 Q28D         REVIEW OF SYSTEMS:   Constitutional: Denies fevers, chills or abnormal weight loss Eyes: Denies blurriness of vision Ears, nose, mouth, throat, and face: Denies mucositis or sore throat Respiratory: Denies cough, dyspnea or wheezes Cardiovascular: Denies palpitation, chest discomfort or lower extremity swelling Gastrointestinal:  Denies nausea, heartburn or change in bowel habits Skin: Denies abnormal skin rashes Lymphatics: Denies new lymphadenopathy or easy bruising Neurological:Denies numbness, tingling or new weaknesses Behavioral/Psych: Mood is stable, no new changes  All other systems were reviewed with the patient and are negative.  I have reviewed the past medical history, past surgical history, social history and family history with the patient and they are unchanged from previous note.  ALLERGIES:  is allergic to erythromycin,  rituxan [rituximab], lovastatin, and tolnaftate.  MEDICATIONS:  Current Outpatient Medications  Medication Sig Dispense Refill   acetaminophen (TYLENOL) 500 MG tablet Take 1 tablet (500 mg total) by mouth every 8 (eight) hours as needed (pain). 30 tablet 0   acyclovir (ZOVIRAX) 400 MG tablet TAKE 1 TABLET BY MOUTH EVERY DAY 90 tablet 1   aspirin 81 MG chewable tablet Chew 1 tablet (81 mg total) by mouth daily. 30 tablet 0   Bempedoic Acid (NEXLETOL) 180 MG TABS Take 1 tablet by mouth daily.     fish oil-omega-3 fatty acids 1000 MG capsule Take 1 g by mouth 2 (two) times daily.      furosemide (LASIX) 40 MG tablet Take 40 mg by mouth as needed.     hydrOXYzine (ATARAX/VISTARIL) 25 MG tablet Take 25 mg by mouth 3 (three) times daily.     losartan (COZAAR) 25 MG tablet Take 25 mg by mouth daily.     metFORMIN (GLUCOPHAGE) 1000 MG tablet Take 1,000 mg by mouth 2 (two) times daily with a meal.       metoprolol tartrate (LOPRESSOR) 100 MG tablet Take 1 tablet (100 mg total) by mouth 2 (two) times daily. PT OVERDUE FOR OV PLEASE CALL FOR APPT 60 tablet 0   nitroGLYCERIN (NITROSTAT) 0.4 MG SL tablet Place 1 tablet (0.4 mg total) under the tongue every 5 (five) minutes as needed for chest pain. 30 tablet 0  ondansetron (ZOFRAN) 8 MG tablet Take 1 tablet (8 mg total) by mouth every 8 (eight) hours as needed for refractory nausea / vomiting. Start on day 2 after bendamustine chemo. 30 tablet 1   prochlorperazine (COMPAZINE) 10 MG tablet Take 1 tablet (10 mg total) by mouth every 6 (six) hours as needed (Nausea or vomiting). 30 tablet 1   TRULICITY 4.5 PP/2.9JJ SOPN Inject 4.5 mg into the skin once a week.     valACYclovir (VALTREX) 500 MG tablet Take 500 mg by mouth 2 (two) times daily as needed. Takes for outbreaks only.     No current facility-administered medications for this visit.    PHYSICAL EXAMINATION: ECOG PERFORMANCE STATUS: 1 - Symptomatic but completely ambulatory  Vitals:   04/09/21  0755  BP: 132/74  Pulse: (!) 111  Resp: 18  Temp: 97.8 F (36.6 C)  SpO2: 100%   Filed Weights   04/09/21 0755  Weight: 258 lb 3.2 oz (117.1 kg)    GENERAL:alert, no distress and comfortable SKIN: skin color, texture, turgor are normal, no rashes or significant lesions EYES: normal, Conjunctiva are pink and non-injected, sclera clear OROPHARYNX:no exudate, no erythema and lips, buccal mucosa, and tongue normal  NECK: supple, thyroid normal size, non-tender, without nodularity LYMPH:  no palpable lymphadenopathy in the cervical, axillary or inguinal LUNGS: clear to auscultation and percussion with normal breathing effort HEART: regular rate & rhythm and no murmurs and no lower extremity edema ABDOMEN:abdomen soft, non-tender and normal bowel sounds Musculoskeletal:no cyanosis of digits and no clubbing  NEURO: alert & oriented x 3 with fluent speech, no focal motor/sensory deficits  LABORATORY DATA:  I have reviewed the data as listed    Component Value Date/Time   NA 140 04/09/2021 0745   NA 138 07/24/2017 1105   K 3.8 04/09/2021 0745   K 4.2 07/24/2017 1105   CL 107 04/09/2021 0745   CL 106 11/19/2012 0837   CO2 23 04/09/2021 0745   CO2 26 07/24/2017 1105   GLUCOSE 259 (H) 04/09/2021 0745   GLUCOSE 362 (H) 07/24/2017 1105   GLUCOSE 107 (H) 11/19/2012 0837   BUN 9 04/09/2021 0745   BUN 10.0 07/24/2017 1105   CREATININE 0.81 04/09/2021 0745   CREATININE 0.8 07/24/2017 1105   CALCIUM 9.1 04/09/2021 0745   CALCIUM 8.8 07/24/2017 1105   PROT 6.2 (L) 04/09/2021 0745   PROT 6.3 (L) 07/24/2017 1105   ALBUMIN 3.4 (L) 04/09/2021 0745   ALBUMIN 3.4 (L) 07/24/2017 1105   AST 20 04/09/2021 0745   AST 13 07/24/2017 1105   ALT 26 04/09/2021 0745   ALT 21 07/24/2017 1105   ALKPHOS 88 04/09/2021 0745   ALKPHOS 112 07/24/2017 1105   BILITOT 0.6 04/09/2021 0745   BILITOT 0.55 07/24/2017 1105   GFRNONAA >60 04/09/2021 0745   GFRAA >60 05/18/2020 0825   GFRAA >60 10/22/2018  0809    No results found for: SPEP, UPEP  Lab Results  Component Value Date   WBC 4.2 04/09/2021   NEUTROABS 3.2 04/09/2021   HGB 12.6 04/09/2021   HCT 36.3 04/09/2021   MCV 78.4 (L) 04/09/2021   PLT 175 04/09/2021      Chemistry      Component Value Date/Time   NA 140 04/09/2021 0745   NA 138 07/24/2017 1105   K 3.8 04/09/2021 0745   K 4.2 07/24/2017 1105   CL 107 04/09/2021 0745   CL 106 11/19/2012 0837   CO2 23 04/09/2021 0745  CO2 26 07/24/2017 1105   BUN 9 04/09/2021 0745   BUN 10.0 07/24/2017 1105   CREATININE 0.81 04/09/2021 0745   CREATININE 0.8 07/24/2017 1105      Component Value Date/Time   CALCIUM 9.1 04/09/2021 0745   CALCIUM 8.8 07/24/2017 1105   ALKPHOS 88 04/09/2021 0745   ALKPHOS 112 07/24/2017 1105   AST 20 04/09/2021 0745   AST 13 07/24/2017 1105   ALT 26 04/09/2021 0745   ALT 21 07/24/2017 1105   BILITOT 0.6 04/09/2021 0745   BILITOT 0.55 07/24/2017 1105

## 2021-04-09 NOTE — Assessment & Plan Note (Signed)
She has tachycardia likely due to slight dehydration Again, we discussed importance of risk factor modification and increase oral fluids as tolerated I gave her a sheet of paper to document her oral intake over the next month and reminded her to bring it in so that I could review her oral intake

## 2021-04-09 NOTE — Telephone Encounter (Signed)
Scheduled appointment per 06/28 sch msg. Patient is aware. 

## 2021-04-09 NOTE — Patient Instructions (Signed)
[facility]   Discharge Instructions: Thank you for choosing [facility] to provide your oncology and hematology care.   If you have a lab appointment with the Cancer Center, please go directly to the Cancer Center and check in at the registration area.   Wear comfortable clothing and clothing appropriate for easy access to any Portacath or PICC line.   We strive to give you quality time with your provider. You may need to reschedule your appointment if you arrive late (15 or more minutes).  Arriving late affects you and other patients whose appointments are after yours.  Also, if you miss three or more appointments without notifying the office, you may be dismissed from the clinic at the provider's discretion.      For prescription refill requests, have your pharmacy contact our office and allow 72 hours for refills to be completed.    Today you received the following chemotherapy and/or immunotherapy agents: bendamustine      To help prevent nausea and vomiting after your treatment, we encourage you to take your nausea medication as directed.  BELOW ARE SYMPTOMS THAT SHOULD BE REPORTED IMMEDIATELY: *FEVER GREATER THAN 100.4 F (38 C) OR HIGHER *CHILLS OR SWEATING *NAUSEA AND VOMITING THAT IS NOT CONTROLLED WITH YOUR NAUSEA MEDICATION *UNUSUAL SHORTNESS OF BREATH *UNUSUAL BRUISING OR BLEEDING *URINARY PROBLEMS (pain or burning when urinating, or frequent urination) *BOWEL PROBLEMS (unusual diarrhea, constipation, pain near the anus) TENDERNESS IN MOUTH AND THROAT WITH OR WITHOUT PRESENCE OF ULCERS (sore throat, sores in mouth, or a toothache) UNUSUAL RASH, SWELLING OR PAIN  UNUSUAL VAGINAL DISCHARGE OR ITCHING   Items with * indicate a potential emergency and should be followed up as soon as possible or go to the Emergency Department if any problems should occur.  Please show the CHEMOTHERAPY ALERT CARD or IMMUNOTHERAPY ALERT CARD at  check-in to the Emergency Department and triage nurse.  Should you have questions after your visit or need to cancel or reschedule your appointment, please contact [facility]  Dept: [phone]  and follow the prompts.  Office hours are 8:00 a.m. to 4:30 p.m. Monday - Friday. Please note that voicemails left after 4:00 p.m. may not be returned until the following business day.  We are closed weekends and major holidays. You have access to a nurse at all times for urgent questions. Please call the main number to the clinic Dept: [phone] and follow the prompts.   For any non-urgent questions, you may also contact your provider using MyChart. We now offer e-Visits for anyone 18 and older to request care online for non-urgent symptoms. For details visit [email or web address].   Also download the MyChart app! Go to the app store, search "MyChart", open the app, select [facility], and log in with your MyChart username and password.  Due to Covid, a mask is required upon entering the hospital/clinic. If you do not have a mask, one will be given to you upon arrival. For doctor visits, patients may have 1 support person aged 18 or older with them. For treatment visits, patients cannot have anyone with them due to current Covid guidelines and our immunocompromised population.   

## 2021-04-09 NOTE — Assessment & Plan Note (Signed)
The patient continues to struggle to maintain blood sugar control and weight I continue to encourage her with aggressive lifestyle changes, dietary modification and increase exercise as tolerated

## 2021-04-10 ENCOUNTER — Inpatient Hospital Stay: Payer: No Typology Code available for payment source

## 2021-04-10 VITALS — BP 118/81 | HR 106 | Temp 97.8°F | Resp 17 | Wt 256.1 lb

## 2021-04-10 DIAGNOSIS — Z7189 Other specified counseling: Secondary | ICD-10-CM

## 2021-04-10 DIAGNOSIS — Z5111 Encounter for antineoplastic chemotherapy: Secondary | ICD-10-CM | POA: Diagnosis not present

## 2021-04-10 DIAGNOSIS — C8307 Small cell B-cell lymphoma, spleen: Secondary | ICD-10-CM

## 2021-04-10 MED ORDER — SODIUM CHLORIDE 0.9 % IV SOLN
60.0000 mg/m2 | Freq: Once | INTRAVENOUS | Status: AC
  Administered 2021-04-10: 150 mg via INTRAVENOUS
  Filled 2021-04-10: qty 6

## 2021-04-10 MED ORDER — SODIUM CHLORIDE 0.9 % IV SOLN
Freq: Once | INTRAVENOUS | Status: AC
  Filled 2021-04-10: qty 250

## 2021-04-10 NOTE — Patient Instructions (Addendum)
[facility] ONCOLOGY  Discharge Instructions: Thank you for choosing [facility] to provide your oncology and hematology care.   If you have a lab appointment with the [facility], please go directly to the [facility] and check in at the registration area.   Wear comfortable clothing and clothing appropriate for easy access to any Portacath or PICC line.   We strive to give you quality time with your provider. You may need to reschedule your appointment if you arrive late (15 or more minutes).  Arriving late affects you and other patients whose appointments are after yours.  Also, if you miss three or more appointments without notifying the office, you may be dismissed from the clinic at the provider's discretion.      For prescription refill requests, have your pharmacy contact our office and allow 72 hours for refills to be completed.    Today you received the following chemotherapy and/or immunotherapy agents Bendamustine.      To help prevent nausea and vomiting after your treatment, we encourage you to take your nausea medication as directed.  BELOW ARE SYMPTOMS THAT SHOULD BE REPORTED IMMEDIATELY: *FEVER GREATER THAN 100.4 F (38 C) OR HIGHER *CHILLS OR SWEATING *NAUSEA AND VOMITING THAT IS NOT CONTROLLED WITH YOUR NAUSEA MEDICATION *UNUSUAL SHORTNESS OF BREATH *UNUSUAL BRUISING OR BLEEDING *URINARY PROBLEMS (pain or burning when urinating, or frequent urination) *BOWEL PROBLEMS (unusual diarrhea, constipation, pain near the anus) TENDERNESS IN MOUTH AND THROAT WITH OR WITHOUT PRESENCE OF ULCERS (sore throat, sores in mouth, or a toothache) UNUSUAL RASH, SWELLING OR PAIN  UNUSUAL VAGINAL DISCHARGE OR ITCHING   Items with * indicate a potential emergency and should be followed up as soon as possible or go to the Emergency Department if any problems should occur.  Please show the CHEMOTHERAPY ALERT CARD or IMMUNOTHERAPY ALERT CARD at check-in  to the Emergency Department and triage nurse.  Should you have questions after your visit or need to cancel or reschedule your appointment, please contact [facility]  Dept: [phone]  and follow the prompts.  Office hours are 8:00 a.m. to 4:30 p.m. Monday - Friday. Please note that voicemails left after 4:00 p.m. may not be returned until the following business day.  We are closed weekends and major holidays. You have access to a nurse at all times for urgent questions. Please call the main number to the clinic Dept: [phone] and follow the prompts.   For any non-urgent questions, you may also contact your provider using MyChart. We now offer e-Visits for anyone 18 and older to request care online for non-urgent symptoms. For details visit [email or web address].   Also download the MyChart app! Go to the app store, search "MyChart", open the app, select [facility], and log in with your MyChart username and password.  Due to Covid, a mask is required upon entering the hospital/clinic. If you do not have a mask, one will be given to you upon arrival. For doctor visits, patients may have 1 support person aged 39 or older with them. For treatment visits, patients cannot have anyone with them due to current Covid guidelines and our immunocompromised population.                                                                                                                                                                                                                                                                                                                                                                                                                                                                                                                                                                                                                                                                                                                                                                                                                                                                                                                                                                                                                                                                                                                                                                                                                                          Marland Kitchen

## 2021-05-09 ENCOUNTER — Inpatient Hospital Stay: Payer: No Typology Code available for payment source | Attending: Hematology and Oncology

## 2021-05-09 ENCOUNTER — Encounter (HOSPITAL_COMMUNITY): Payer: Self-pay

## 2021-05-09 ENCOUNTER — Other Ambulatory Visit: Payer: Self-pay

## 2021-05-09 ENCOUNTER — Ambulatory Visit (HOSPITAL_COMMUNITY)
Admission: RE | Admit: 2021-05-09 | Discharge: 2021-05-09 | Disposition: A | Discharge status: Home / Self Care | Payer: No Typology Code available for payment source | Source: Ambulatory Visit | Attending: Hematology and Oncology | Admitting: Hematology and Oncology

## 2021-05-09 DIAGNOSIS — B029 Zoster without complications: Secondary | ICD-10-CM | POA: Insufficient documentation

## 2021-05-09 DIAGNOSIS — L089 Local infection of the skin and subcutaneous tissue, unspecified: Secondary | ICD-10-CM | POA: Insufficient documentation

## 2021-05-09 DIAGNOSIS — C8307 Small cell B-cell lymphoma, spleen: Secondary | ICD-10-CM | POA: Insufficient documentation

## 2021-05-09 DIAGNOSIS — Z7189 Other specified counseling: Secondary | ICD-10-CM

## 2021-05-09 LAB — CBC WITH DIFFERENTIAL (CANCER CENTER ONLY)
Abs Immature Granulocytes: 0.01 10*3/uL (ref 0.00–0.07)
Basophils Absolute: 0 10*3/uL (ref 0.0–0.1)
Basophils Relative: 1 %
Eosinophils Absolute: 0.2 10*3/uL (ref 0.0–0.5)
Eosinophils Relative: 3 %
HCT: 38.5 % (ref 36.0–46.0)
Hemoglobin: 13.4 g/dL (ref 12.0–15.0)
Immature Granulocytes: 0 %
Lymphocytes Relative: 10 %
Lymphs Abs: 0.7 10*3/uL (ref 0.7–4.0)
MCH: 27.1 pg (ref 26.0–34.0)
MCHC: 34.8 g/dL (ref 30.0–36.0)
MCV: 77.8 fL — ABNORMAL LOW (ref 80.0–100.0)
Monocytes Absolute: 0.6 10*3/uL (ref 0.1–1.0)
Monocytes Relative: 10 %
Neutro Abs: 5 10*3/uL (ref 1.7–7.7)
Neutrophils Relative %: 76 %
Platelet Count: 194 10*3/uL (ref 150–400)
RBC: 4.95 MIL/uL (ref 3.87–5.11)
RDW: 14.2 % (ref 11.5–15.5)
WBC Count: 6.5 10*3/uL (ref 4.0–10.5)
nRBC: 0 % (ref 0.0–0.2)

## 2021-05-09 LAB — CMP (CANCER CENTER ONLY)
ALT: 17 U/L (ref 0–44)
AST: 13 U/L — ABNORMAL LOW (ref 15–41)
Albumin: 3.9 g/dL (ref 3.5–5.0)
Alkaline Phosphatase: 86 U/L (ref 38–126)
Anion gap: 12 (ref 5–15)
BUN: 20 mg/dL (ref 6–20)
CO2: 25 mmol/L (ref 22–32)
Calcium: 9.8 mg/dL (ref 8.9–10.3)
Chloride: 102 mmol/L (ref 98–111)
Creatinine: 0.83 mg/dL (ref 0.44–1.00)
GFR, Estimated: >60 mL/min (ref >60)
Glucose, Bld: 250 mg/dL — ABNORMAL HIGH (ref 70–99)
Potassium: 4.3 mmol/L (ref 3.5–5.1)
Sodium: 139 mmol/L (ref 135–145)
Total Bilirubin: 0.8 mg/dL (ref 0.3–1.2)
Total Protein: 6.9 g/dL (ref 6.5–8.1)

## 2021-05-10 ENCOUNTER — Encounter: Payer: Self-pay | Admitting: Hematology and Oncology

## 2021-05-10 ENCOUNTER — Inpatient Hospital Stay (HOSPITAL_BASED_OUTPATIENT_CLINIC_OR_DEPARTMENT_OTHER): Payer: No Typology Code available for payment source | Admitting: Hematology and Oncology

## 2021-05-10 DIAGNOSIS — B029 Zoster without complications: Secondary | ICD-10-CM | POA: Insufficient documentation

## 2021-05-10 DIAGNOSIS — L089 Local infection of the skin and subcutaneous tissue, unspecified: Secondary | ICD-10-CM | POA: Diagnosis not present

## 2021-05-10 DIAGNOSIS — C8307 Small cell B-cell lymphoma, spleen: Secondary | ICD-10-CM

## 2021-05-10 MED ORDER — VALACYCLOVIR HCL 1 G PO TABS: 1000.0000 mg | ORAL_TABLET | Freq: Three times a day (TID) | ORAL | 0 refills | Status: DC

## 2021-05-10 MED ORDER — CEPHALEXIN 500 MG PO CAPS: 500.0000 mg | ORAL_CAPSULE | Freq: Three times a day (TID) | ORAL | 0 refills | Status: DC

## 2021-05-10 NOTE — Assessment & Plan Note (Signed)
She has atypical skin lesions over her breast, herpes also cannot be excluded I recommend a course of Valtrex

## 2021-05-10 NOTE — Assessment & Plan Note (Signed)
She has areas of skin infection from scratching I recommend a course of antibiotics and will reassess next month

## 2021-05-10 NOTE — Assessment & Plan Note (Signed)
I have reviewed multiple CT imaging with the patient She has excellent response to therapy Continue supportive care

## 2021-05-10 NOTE — Progress Notes (Signed)
[facility] OFFICE PROGRESS NOTE  Patient Care Team: Lennie Odor, [place] as PCP - General (Nurse Practitioner) Heath Lark, MD as Consulting Physician (Hematology and Oncology)  ASSESSMENT & PLAN:  Splenic marginal zone b-cell lymphoma ([place]) I have reviewed multiple CT imaging with the patient She has excellent response to therapy Continue supportive care  Zoster without complications She has atypical skin lesions over her breast, herpes also cannot be excluded I recommend a course of Valtrex  Skin infection She has areas of skin infection from scratching I recommend a course of antibiotics and will reassess next month  No orders of the defined types were placed in this encounter.   All questions were answered. The patient knows to call the clinic with any problems, questions or concerns. The total time spent in the appointment was 20 minutes encounter with patients including review of chart and various tests results, discussions about plan of care and coordination of care plan   Heath Lark, MD 05/10/2021 9:59 AM  INTERVAL HISTORY: Please see below for problem oriented charting. She returns for follow-up She is doing well except for skin itching She has scratched some of the skin lesions unfortunately No recent fever or chills She had excessive fatigue with recent treatment  SUMMARY OF ONCOLOGIC HISTORY: Oncology History  Splenic marginal zone b-cell lymphoma ([place])  01/11/2009 Initial Diagnosis   Splenic marginal zone b-cell lymphoma    06/06/2014 Bone Marrow Biopsy   Bone marrow aspirate and biopsy confirmed extensive involvement by CD20 positive non-Hodgkin's lymphoma.    06/07/2014 Imaging   She had a PET scan which showed predominant splenic involvement.    06/16/2014 - 07/07/2014 Chemotherapy   She started on weekly rituximab.    06/16/2014 Adverse Reaction   She had infusion reaction, resolved with IV dexamethasone.    08/18/2014 Imaging   PET CT  scan show resolution of hypermetabolic activity.    04/08/2016 Imaging   1. No evidence for aortic dissection or vascular injury. 2. Mild atherosclerotic changes within the abdominal aorta and branch vessels without aneurysm. 3. No significant adenopathy or evidence for recurrent lymphoma. 4. Stable mild splenomegaly. 5. 5 mm nodule in the right lower lobe demonstrates interval increase in size. No follow-up needed if patient is low-risk. Non-contrast chest CT can be considered in 12 months if patient is high-risk.  6. Degenerate changes at the SI joints bilaterally.    01/21/2018 Imaging   1. Persistent mild splenomegaly, similar to prior examinations. No other findings to suggest recurrent disease in the chest, abdomen or pelvis. 2. Aortic atherosclerosis, in addition to two vessel coronary artery disease. Please note that although the presence of coronary artery calcium documents the presence of coronary artery disease, the severity of this disease and any potential stenosis cannot be assessed on this non-gated CT examination. Assessment for potential risk factor modification, dietary therapy or pharmacologic therapy may be warranted, if clinically indicated.  3. Enlarging heterogeneously enhancing and partially calcified left thyroid lobe nodule. Further evaluation with nonemergent thyroid ultrasound is recommended in the near future to better evaluate this lesion and determine if there is a need for fine-needle aspiration. 4. Additional incidental findings, as above.    09/28/2020 Imaging   1. Progressive splenomegaly. The splenic volume has almost doubled since 2019. 2. No adenopathy in the chest, abdomen or pelvis. 3. Stable fibroid uterus. 4. Moderate stool throughout the colon and down into the rectum suggesting constipation.   Aortic Atherosclerosis (ICD10-I70.0).   09/28/2020 Cancer Staging  Staging form: Hodgkin and Non-Hodgkin Lymphoma, AJCC 8th Edition - Clinical stage from  09/28/2020: Stage IE (Marginal zone lymphoma) - Signed by Heath Lark, MD on 09/28/2020    10/15/2020 - 04/10/2021 Chemotherapy    Patient received bendamustine x 6 cycles.    05/10/2021 Imaging   1. No lymphadenopathy or other findings of recurrent lymphoma in the abdomen or pelvis. Spleen is normal size, decreased from prior. 2. Mild diffuse hepatic steatosis. 3. Mild sigmoid diverticulosis. 4. Enlarged myomatous uterus. 5. Coronary atherosclerosis. 6. Aortic Atherosclerosis (ICD10-I70.0).     REVIEW OF SYSTEMS:   Constitutional: Denies fevers, chills or abnormal weight loss Eyes: Denies blurriness of vision Ears, nose, mouth, throat, and face: Denies mucositis or sore throat Respiratory: Denies cough, dyspnea or wheezes Cardiovascular: Denies palpitation, chest discomfort or lower extremity swelling Gastrointestinal:  Denies nausea, heartburn or change in bowel habits Lymphatics: Denies new lymphadenopathy or easy bruising Neurological:Denies numbness, tingling or new weaknesses Behavioral/Psych: Mood is stable, no new changes  All other systems were reviewed with the patient and are negative.  I have reviewed the past medical history, past surgical history, social history and family history with the patient and they are unchanged from previous note.  ALLERGIES:  is allergic to erythromycin, rituxan [rituximab], lovastatin, and tolnaftate.  MEDICATIONS:  Current Outpatient Medications  Medication Sig Dispense Refill   cephALEXin (KEFLEX) 500 MG capsule Take 1 capsule (500 mg total) by mouth 3 (three) times daily. 21 capsule 0   acetaminophen (TYLENOL) 500 MG tablet Take 1 tablet (500 mg total) by mouth every 8 (eight) hours as needed (pain). 30 tablet 0   aspirin 81 MG chewable tablet Chew 1 tablet (81 mg total) by mouth daily. 30 tablet 0   Bempedoic Acid (NEXLETOL) 180 MG TABS Take 1 tablet by mouth daily.     fish oil-omega-3 fatty acids 1000 MG capsule Take 1 g by mouth 2  (two) times daily.      furosemide (LASIX) 40 MG tablet Take 40 mg by mouth as needed.     hydrOXYzine (ATARAX/VISTARIL) 25 MG tablet Take 25 mg by mouth 3 (three) times daily.     losartan (COZAAR) 25 MG tablet Take 25 mg by mouth daily.     metFORMIN (GLUCOPHAGE) 1000 MG tablet Take 1,000 mg by mouth 2 (two) times daily with a meal.       metoprolol tartrate (LOPRESSOR) 100 MG tablet Take 1 tablet (100 mg total) by mouth 2 (two) times daily. PT OVERDUE FOR OV PLEASE CALL FOR APPT 60 tablet 0   nitroGLYCERIN (NITROSTAT) 0.4 MG SL tablet Place 1 tablet (0.4 mg total) under the tongue every 5 (five) minutes as needed for chest pain. 30 tablet 0   ondansetron (ZOFRAN) 8 MG tablet Take 1 tablet (8 mg total) by mouth every 8 (eight) hours as needed for refractory nausea / vomiting. Start on day 2 after bendamustine chemo. 30 tablet 1   prochlorperazine (COMPAZINE) 10 MG tablet Take 1 tablet (10 mg total) by mouth every 6 (six) hours as needed (Nausea or vomiting). 30 tablet 1   TRULICITY 4.5 WL/7.9GX SOPN Inject 4.5 mg into the skin once a week.     valACYclovir (VALTREX) 1000 MG tablet Take 1 tablet (1,000 mg total) by mouth 3 (three) times daily. 21 tablet 0   No current facility-administered medications for this visit.    PHYSICAL EXAMINATION: ECOG PERFORMANCE STATUS: 1 - Symptomatic but completely ambulatory  Vitals:   05/10/21 0836  BP:  123/80  Pulse: (!) 118  Resp: 18  Temp: 97.7 F (36.5 C)  SpO2: 99%   Filed Weights   05/10/21 0836  Weight: 246 lb 9.6 oz (111.9 kg)    GENERAL:alert, no distress and comfortable SKIN: Noted nodular/blisterlike skin lesion over her right shoulder and breast.  There are areas suspicious for skin infection from scratching EYES: normal, Conjunctiva are pink and non-injected, sclera clear OROPHARYNX:no exudate, no erythema and lips, buccal mucosa, and tongue normal  NECK: supple, thyroid normal size, non-tender, without nodularity LYMPH:  no  palpable lymphadenopathy in the cervical, axillary or inguinal LUNGS: clear to auscultation and percussion with normal breathing effort HEART: regular rate & rhythm and no murmurs and no lower extremity edema ABDOMEN:abdomen soft, non-tender and normal bowel sounds Musculoskeletal:no cyanosis of digits and no clubbing  NEURO: alert & oriented x 3 with fluent speech, no focal motor/sensory deficits  LABORATORY DATA:  I have reviewed the data as listed    Component Value Date/Time   NA 139 05/09/2021 0737   NA 138 07/24/2017 1105   K 4.3 05/09/2021 0737   K 4.2 07/24/2017 1105   CL 102 05/09/2021 0737   CL 106 11/19/2012 0837   CO2 25 05/09/2021 0737   CO2 26 07/24/2017 1105   GLUCOSE 250 (H) 05/09/2021 0737   GLUCOSE 362 (H) 07/24/2017 1105   GLUCOSE 107 (H) 11/19/2012 0837   BUN 20 05/09/2021 0737   BUN 10.0 07/24/2017 1105   CREATININE 0.83 05/09/2021 0737   CREATININE 0.8 07/24/2017 1105   CALCIUM 9.8 05/09/2021 0737   CALCIUM 8.8 07/24/2017 1105   PROT 6.9 05/09/2021 0737   PROT 6.3 (L) 07/24/2017 1105   ALBUMIN 3.9 05/09/2021 0737   ALBUMIN 3.4 (L) 07/24/2017 1105   AST 13 (L) 05/09/2021 0737   AST 13 07/24/2017 1105   ALT 17 05/09/2021 0737   ALT 21 07/24/2017 1105   ALKPHOS 86 05/09/2021 0737   ALKPHOS 112 07/24/2017 1105   BILITOT 0.8 05/09/2021 0737   BILITOT 0.55 07/24/2017 1105   GFRNONAA >60 05/09/2021 0737   GFRAA >60 05/18/2020 0825   GFRAA >60 10/22/2018 0809    No results found for: SPEP, UPEP  Lab Results  Component Value Date   WBC 6.5 05/09/2021   NEUTROABS 5.0 05/09/2021   HGB 13.4 05/09/2021   HCT 38.5 05/09/2021   MCV 77.8 (L) 05/09/2021   PLT 194 05/09/2021      Chemistry      Component Value Date/Time   NA 139 05/09/2021 0737   NA 138 07/24/2017 1105   K 4.3 05/09/2021 0737   K 4.2 07/24/2017 1105   CL 102 05/09/2021 0737   CL 106 11/19/2012 0837   CO2 25 05/09/2021 0737   CO2 26 07/24/2017 1105   BUN 20 05/09/2021 0737   BUN  10.0 07/24/2017 1105   CREATININE 0.83 05/09/2021 0737   CREATININE 0.8 07/24/2017 1105      Component Value Date/Time   CALCIUM 9.8 05/09/2021 0737   CALCIUM 8.8 07/24/2017 1105   ALKPHOS 86 05/09/2021 0737   ALKPHOS 112 07/24/2017 1105   AST 13 (L) 05/09/2021 0737   AST 13 07/24/2017 1105   ALT 17 05/09/2021 0737   ALT 21 07/24/2017 1105   BILITOT 0.8 05/09/2021 0737   BILITOT 0.55 07/24/2017 1105       RADIOGRAPHIC STUDIES: I have reviewed multiple imaging studies with the patient I have personally reviewed the radiological images as listed and agreed  with the findings in the report. CT Abdomen Pelvis Wo Contrast  Result Date: 05/09/2021 CLINICAL DATA:  Non-Hodgkin's lymphoma diagnosed 2010 status post radiation therapy and chemotherapy. Restaging. EXAM: CT ABDOMEN AND PELVIS WITHOUT CONTRAST TECHNIQUE: Multidetector CT imaging of the abdomen and pelvis was performed following the standard protocol without IV contrast. COMPARISON:  09/28/2020 CT chest, abdomen and pelvis. FINDINGS: Lower chest: No significant pulmonary nodules or acute consolidative airspace disease. Coronary atherosclerosis. Hepatobiliary: Mild diffuse hepatic steatosis. No definite liver surface irregularity. Normal liver size. No liver masses. Normal gallbladder with no radiopaque cholelithiasis. No biliary ductal dilatation. Pancreas: Normal, with no mass or duct dilation. Spleen: Spleen normal size, craniocaudal splenic length 10.9 cm, decreased from 16.5 cm. No splenic mass. Adrenals/Urinary Tract: Normal adrenals. No renal stones. No hydronephrosis. Normal bladder. Stomach/Bowel: Normal non-distended stomach. Normal caliber small bowel with no small bowel wall thickening. Normal appendix. Mild sigmoid diverticulosis with no large bowel wall thickening or significant pericolonic fat stranding. Vascular/Lymphatic: Atherosclerotic nonaneurysmal abdominal aorta. No pathologically enlarged lymph nodes in the abdomen or  pelvis. Reproductive: Enlarged myomatous uterus with suggestion of a dominant 5.6 cm anterior uterine fibroid, similar. No adnexal masses proved Other: No pneumoperitoneum, ascites or focal fluid collection. Musculoskeletal: No aggressive appearing focal osseous lesions. Mild thoracolumbar spondylosis. IMPRESSION: 1. No lymphadenopathy or other findings of recurrent lymphoma in the abdomen or pelvis. Spleen is normal size, decreased from prior. 2. Mild diffuse hepatic steatosis. 3. Mild sigmoid diverticulosis. 4. Enlarged myomatous uterus. 5. Coronary atherosclerosis. 6. Aortic Atherosclerosis (ICD10-I70.0). Electronically Signed   By: Ilona Sorrel M.D.   On: 05/09/2021 16:04

## 2021-06-10 ENCOUNTER — Encounter: Payer: Self-pay | Admitting: Hematology and Oncology

## 2021-06-10 ENCOUNTER — Other Ambulatory Visit: Payer: Self-pay

## 2021-06-10 ENCOUNTER — Telehealth: Payer: Self-pay

## 2021-06-10 ENCOUNTER — Inpatient Hospital Stay: Payer: No Typology Code available for payment source | Attending: Hematology and Oncology

## 2021-06-10 ENCOUNTER — Inpatient Hospital Stay (HOSPITAL_BASED_OUTPATIENT_CLINIC_OR_DEPARTMENT_OTHER): Payer: No Typology Code available for payment source | Admitting: Hematology and Oncology

## 2021-06-10 DIAGNOSIS — Z6839 Body mass index (BMI) 39.0-39.9, adult: Secondary | ICD-10-CM | POA: Diagnosis not present

## 2021-06-10 DIAGNOSIS — Z79899 Other long term (current) drug therapy: Secondary | ICD-10-CM | POA: Insufficient documentation

## 2021-06-10 DIAGNOSIS — E1165 Type 2 diabetes mellitus with hyperglycemia: Secondary | ICD-10-CM

## 2021-06-10 DIAGNOSIS — C8307 Small cell B-cell lymphoma, spleen: Secondary | ICD-10-CM | POA: Diagnosis not present

## 2021-06-10 DIAGNOSIS — Z7984 Long term (current) use of oral hypoglycemic drugs: Secondary | ICD-10-CM | POA: Insufficient documentation

## 2021-06-10 DIAGNOSIS — Z7189 Other specified counseling: Secondary | ICD-10-CM

## 2021-06-10 LAB — CBC WITH DIFFERENTIAL (CANCER CENTER ONLY)
Abs Immature Granulocytes: 0.02 10*3/uL (ref 0.00–0.07)
Basophils Absolute: 0.1 10*3/uL (ref 0.0–0.1)
Basophils Relative: 1 %
Eosinophils Absolute: 0.2 10*3/uL (ref 0.0–0.5)
Eosinophils Relative: 4 %
HCT: 38.4 % (ref 36.0–46.0)
Hemoglobin: 13.4 g/dL (ref 12.0–15.0)
Immature Granulocytes: 0 %
Lymphocytes Relative: 12 %
Lymphs Abs: 0.7 10*3/uL (ref 0.7–4.0)
MCH: 27.2 pg (ref 26.0–34.0)
MCHC: 34.9 g/dL (ref 30.0–36.0)
MCV: 78 fL — ABNORMAL LOW (ref 80.0–100.0)
Monocytes Absolute: 0.4 10*3/uL (ref 0.1–1.0)
Monocytes Relative: 8 %
Neutro Abs: 4.1 10*3/uL (ref 1.7–7.7)
Neutrophils Relative %: 75 %
Platelet Count: 219 10*3/uL (ref 150–400)
RBC: 4.92 MIL/uL (ref 3.87–5.11)
RDW: 15.6 % — ABNORMAL HIGH (ref 11.5–15.5)
WBC Count: 5.5 10*3/uL (ref 4.0–10.5)
nRBC: 0 % (ref 0.0–0.2)

## 2021-06-10 LAB — CMP (CANCER CENTER ONLY)
ALT: 23 U/L (ref 0–44)
AST: 19 U/L (ref 15–41)
Albumin: 4.1 g/dL (ref 3.5–5.0)
Alkaline Phosphatase: 84 U/L (ref 38–126)
Anion gap: 13 (ref 5–15)
BUN: 12 mg/dL (ref 6–20)
CO2: 27 mmol/L (ref 22–32)
Calcium: 9.8 mg/dL (ref 8.9–10.3)
Chloride: 100 mmol/L (ref 98–111)
Creatinine: 0.91 mg/dL (ref 0.44–1.00)
GFR, Estimated: >60 mL/min (ref >60)
Glucose, Bld: 296 mg/dL — ABNORMAL HIGH (ref 70–99)
Potassium: 4.1 mmol/L (ref 3.5–5.1)
Sodium: 140 mmol/L (ref 135–145)
Total Bilirubin: 0.7 mg/dL (ref 0.3–1.2)
Total Protein: 6.9 g/dL (ref 6.5–8.1)

## 2021-06-10 NOTE — Assessment & Plan Note (Signed)
She has gained a tremendous amount of weight since last time I saw her We have extensive discussions about the importance of lifestyle changes and risk factor modification I discussed with her the increased risk of recurrence of lymphoma with obesity

## 2021-06-10 NOTE — Assessment & Plan Note (Signed)
The patient continues to struggle to maintain blood sugar control and weight I continue to encourage her with aggressive lifestyle changes, dietary modification and increase exercise as tolerated

## 2021-06-10 NOTE — Assessment & Plan Note (Signed)
She has completed recent treatment There is no signs of recurrent lymphoma She has completed a course of Valtrex and Keflex for skin infection I will see her again in 3 months for further follow-up We discussed the importance of aggressive lifestyle changes

## 2021-06-10 NOTE — Telephone Encounter (Signed)
Called and given below message. She verbalized understanding and will contact PCP. 

## 2021-06-10 NOTE — Telephone Encounter (Signed)
-----   Message from Heath Lark, MD sent at 06/10/2021  8:30 AM EDT ----- Tell her blood sugar is out of control She needs to get her PCP to help manage her DM

## 2021-06-10 NOTE — Progress Notes (Signed)
[place] [facility] OFFICE PROGRESS NOTE  Patient Care Team: Lennie Odor, [place] as PCP - General (Nurse Practitioner) Heath Lark, MD as Consulting Physician (Hematology and Oncology)  ASSESSMENT & PLAN:  Splenic marginal zone b-cell lymphoma [facility]) She has completed recent treatment There is no signs of recurrent lymphoma She has completed a course of Valtrex and Keflex for skin infection I will see her again in 3 months for further follow-up We discussed the importance of aggressive lifestyle changes  Class 2 severe obesity with serious comorbidity and body mass index (BMI) of 39.0 to 39.9 in adult [facility]) She has gained a tremendous amount of weight since last time I saw her We have extensive discussions about the importance of lifestyle changes and risk factor modification I discussed with her the increased risk of recurrence of lymphoma with obesity  Diabetes mellitus type 2, uncontrolled ([place]) The patient continues to struggle to maintain blood sugar control and weight I continue to encourage her with aggressive lifestyle changes, dietary modification and increase exercise as tolerated   No orders of the defined types were placed in this encounter.   All questions were answered. The patient knows to call the clinic with any problems, questions or concerns. The total time spent in the appointment was 20 minutes encounter with patients including review of chart and various tests results, discussions about plan of care and coordination of care plan   Heath Lark, MD 06/10/2021 8:05 AM  INTERVAL HISTORY: Please see below for problem oriented charting. she returns for follow-up on history of lymphoma She has completed last cycle of treatment in July She had gained a lot of weight since last time I saw her No new lymphadenopathy or infection She has completed a course of antibiotics and antiviral medications recently She continues to have occasional skin itching but her skin  lesions on her legs are much improved  REVIEW OF SYSTEMS:   Constitutional: Denies fevers, chills or abnormal weight loss Eyes: Denies blurriness of vision Ears, nose, mouth, throat, and face: Denies mucositis or sore throat Respiratory: Denies cough, dyspnea or wheezes Cardiovascular: Denies palpitation, chest discomfort or lower extremity swelling Gastrointestinal:  Denies nausea, heartburn or change in bowel habits Lymphatics: Denies new lymphadenopathy or easy bruising Neurological:Denies numbness, tingling or new weaknesses Behavioral/Psych: Mood is stable, no new changes  All other systems were reviewed with the patient and are negative.  I have reviewed the past medical history, past surgical history, social history and family history with the patient and they are unchanged from previous note.  ALLERGIES:  is allergic to erythromycin, rituxan [rituximab], lovastatin, and tolnaftate.  MEDICATIONS:  Current Outpatient Medications  Medication Sig Dispense Refill   acetaminophen (TYLENOL) 500 MG tablet Take 1 tablet (500 mg total) by mouth every 8 (eight) hours as needed (pain). 30 tablet 0   aspirin 81 MG chewable tablet Chew 1 tablet (81 mg total) by mouth daily. 30 tablet 0   Bempedoic Acid (NEXLETOL) 180 MG TABS Take 1 tablet by mouth daily.     fish oil-omega-3 fatty acids 1000 MG capsule Take 1 g by mouth 2 (two) times daily.      furosemide (LASIX) 40 MG tablet Take 40 mg by mouth as needed.     hydrOXYzine (ATARAX/VISTARIL) 25 MG tablet Take 25 mg by mouth 3 (three) times daily.     losartan (COZAAR) 25 MG tablet Take 25 mg by mouth daily.     metFORMIN (GLUCOPHAGE) 1000 MG tablet Take 1,000 mg  by mouth 2 (two) times daily with a meal.       metoprolol tartrate (LOPRESSOR) 100 MG tablet Take 1 tablet (100 mg total) by mouth 2 (two) times daily. PT OVERDUE FOR OV PLEASE CALL FOR APPT 60 tablet 0   nitroGLYCERIN (NITROSTAT) 0.4 MG SL tablet Place 1 tablet (0.4 mg total) under  the tongue every 5 (five) minutes as needed for chest pain. 30 tablet 0   ondansetron (ZOFRAN) 8 MG tablet Take 1 tablet (8 mg total) by mouth every 8 (eight) hours as needed for refractory nausea / vomiting. Start on day 2 after bendamustine chemo. 30 tablet 1   prochlorperazine (COMPAZINE) 10 MG tablet Take 1 tablet (10 mg total) by mouth every 6 (six) hours as needed (Nausea or vomiting). 30 tablet 1   TRULICITY 4.5 RS/8.5IO SOPN Inject 4.5 mg into the skin once a week.     No current facility-administered medications for this visit.    SUMMARY OF ONCOLOGIC HISTORY: Oncology History  Splenic marginal zone b-cell lymphoma ([place])  01/11/2009 Initial Diagnosis   Splenic marginal zone b-cell lymphoma   06/06/2014 Bone Marrow Biopsy   Bone marrow aspirate and biopsy confirmed extensive involvement by CD20 positive non-Hodgkin's lymphoma.   06/07/2014 Imaging   She had a PET scan which showed predominant splenic involvement.   06/16/2014 - 07/07/2014 Chemotherapy   She started on weekly rituximab.   06/16/2014 Adverse Reaction   She had infusion reaction, resolved with IV dexamethasone.   08/18/2014 Imaging   PET CT scan show resolution of hypermetabolic activity.   04/08/2016 Imaging   1. No evidence for aortic dissection or vascular injury. 2. Mild atherosclerotic changes within the abdominal aorta and branch vessels without aneurysm. 3. No significant adenopathy or evidence for recurrent lymphoma. 4. Stable mild splenomegaly. 5. 5 mm nodule in the right lower lobe demonstrates interval increase in size. No follow-up needed if patient is low-risk. Non-contrast chest CT can be considered in 12 months if patient is high-risk.  6. Degenerate changes at the SI joints bilaterally.   01/21/2018 Imaging   1. Persistent mild splenomegaly, similar to prior examinations. No other findings to suggest recurrent disease in the chest, abdomen or pelvis. 2. Aortic atherosclerosis, in addition to two vessel  coronary artery disease. Please note that although the presence of coronary artery calcium documents the presence of coronary artery disease, the severity of this disease and any potential stenosis cannot be assessed on this non-gated CT examination. Assessment for potential risk factor modification, dietary therapy or pharmacologic therapy may be warranted, if clinically indicated.  3. Enlarging heterogeneously enhancing and partially calcified left thyroid lobe nodule. Further evaluation with nonemergent thyroid ultrasound is recommended in the near future to better evaluate this lesion and determine if there is a need for fine-needle aspiration. 4. Additional incidental findings, as above.   09/28/2020 Imaging   1. Progressive splenomegaly. The splenic volume has almost doubled since 2019. 2. No adenopathy in the chest, abdomen or pelvis. 3. Stable fibroid uterus. 4. Moderate stool throughout the colon and down into the rectum suggesting constipation.   Aortic Atherosclerosis (ICD10-I70.0).   09/28/2020 Cancer Staging   Staging form: Hodgkin and Non-Hodgkin Lymphoma, AJCC 8th Edition - Clinical stage from 09/28/2020: Stage IE (Marginal zone lymphoma) - Signed by Heath Lark, MD on 09/28/2020   10/15/2020 - 04/10/2021 Chemotherapy    Patient received bendamustine x 6 cycles.    05/10/2021 Imaging   1. No lymphadenopathy or other findings of recurrent  lymphoma in the abdomen or pelvis. Spleen is normal size, decreased from prior. 2. Mild diffuse hepatic steatosis. 3. Mild sigmoid diverticulosis. 4. Enlarged myomatous uterus. 5. Coronary atherosclerosis. 6. Aortic Atherosclerosis (ICD10-I70.0).     PHYSICAL EXAMINATION: ECOG PERFORMANCE STATUS: 1 - Symptomatic but completely ambulatory  Vitals:   06/10/21 0754  BP: 121/73  Pulse: 93  Resp: 18  Temp: 97.8 F (36.6 C)  SpO2: 100%   Filed Weights   06/10/21 0754  Weight: 255 lb 3.2 oz (115.8 kg)    GENERAL:alert, no distress  and comfortable SKIN: Her previous skin lesions are persistent but much improved NEURO: alert & oriented x 3 with fluent speech, no focal motor/sensory deficits  LABORATORY DATA:  I have reviewed the data as listed    Component Value Date/Time   NA 139 05/09/2021 0737   NA 138 07/24/2017 1105   K 4.3 05/09/2021 0737   K 4.2 07/24/2017 1105   CL 102 05/09/2021 0737   CL 106 11/19/2012 0837   CO2 25 05/09/2021 0737   CO2 26 07/24/2017 1105   GLUCOSE 250 (H) 05/09/2021 0737   GLUCOSE 362 (H) 07/24/2017 1105   GLUCOSE 107 (H) 11/19/2012 0837   BUN 20 05/09/2021 0737   BUN 10.0 07/24/2017 1105   CREATININE 0.83 05/09/2021 0737   CREATININE 0.8 07/24/2017 1105   CALCIUM 9.8 05/09/2021 0737   CALCIUM 8.8 07/24/2017 1105   PROT 6.9 05/09/2021 0737   PROT 6.3 (L) 07/24/2017 1105   ALBUMIN 3.9 05/09/2021 0737   ALBUMIN 3.4 (L) 07/24/2017 1105   AST 13 (L) 05/09/2021 0737   AST 13 07/24/2017 1105   ALT 17 05/09/2021 0737   ALT 21 07/24/2017 1105   ALKPHOS 86 05/09/2021 0737   ALKPHOS 112 07/24/2017 1105   BILITOT 0.8 05/09/2021 0737   BILITOT 0.55 07/24/2017 1105   GFRNONAA >60 05/09/2021 0737   GFRAA >60 05/18/2020 0825   GFRAA >60 10/22/2018 0809    No results found for: SPEP, UPEP  Lab Results  Component Value Date   WBC 5.5 06/10/2021   NEUTROABS 4.1 06/10/2021   HGB 13.4 06/10/2021   HCT 38.4 06/10/2021   MCV 78.0 (L) 06/10/2021   PLT 219 06/10/2021      Chemistry      Component Value Date/Time   NA 139 05/09/2021 0737   NA 138 07/24/2017 1105   K 4.3 05/09/2021 0737   K 4.2 07/24/2017 1105   CL 102 05/09/2021 0737   CL 106 11/19/2012 0837   CO2 25 05/09/2021 0737   CO2 26 07/24/2017 1105   BUN 20 05/09/2021 0737   BUN 10.0 07/24/2017 1105   CREATININE 0.83 05/09/2021 0737   CREATININE 0.8 07/24/2017 1105      Component Value Date/Time   CALCIUM 9.8 05/09/2021 0737   CALCIUM 8.8 07/24/2017 1105   ALKPHOS 86 05/09/2021 0737   ALKPHOS 112 07/24/2017  1105   AST 13 (L) 05/09/2021 0737   AST 13 07/24/2017 1105   ALT 17 05/09/2021 0737   ALT 21 07/24/2017 1105   BILITOT 0.8 05/09/2021 0737   BILITOT 0.55 07/24/2017 1105

## 2021-08-20 IMAGING — CT CT CHEST-ABD-PELV W/ CM
3 of 5 series · 15 of 36 positions shown, 17 images · IV contrast (Omnipaque)
Comparison: CT scans 01/20/2018

CLINICAL DATA: Non-Hodgkin's lymphoma.  Restaging.

EXAM:
CT CHEST, ABDOMEN, AND PELVIS WITH CONTRAST
TECHNIQUE: Multidetector CT imaging of the chest, abdomen and pelvis was
performed following the standard protocol during bolus
administration of intravenous contrast.
CONTRAST:  100mL OMNIPAQUE IOHEXOL 300 MG/ML  SOLN

[Series 2: cap with 2 · axial · 0.98mm/px · z∈[-697,-142]mm · 10 of 137 slices shown, 12 images]
[im 13/137  mediastinal]
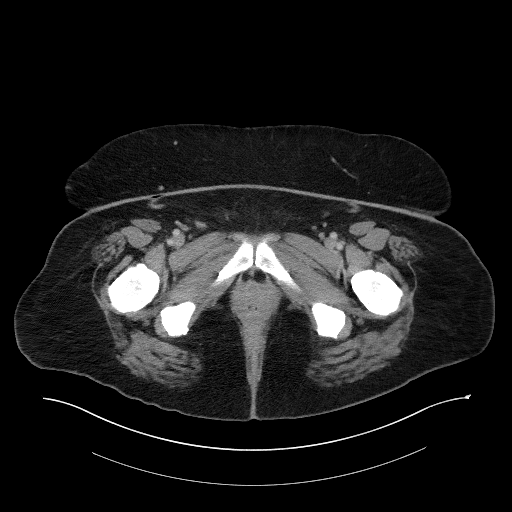
[im 13/137  bone]
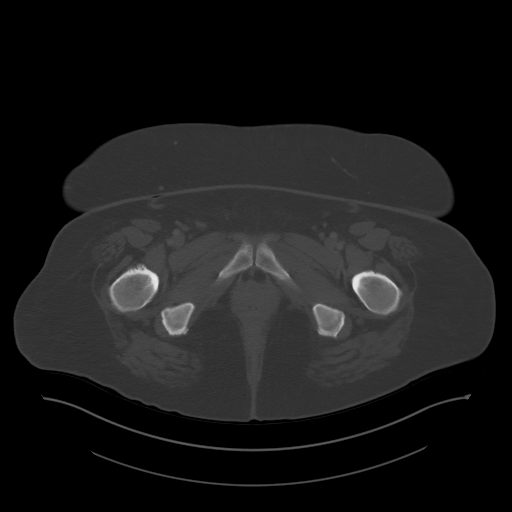
[im 25/137  mediastinal]
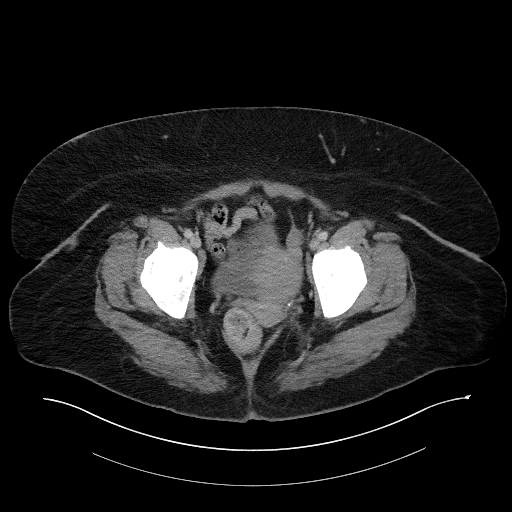
[im 38/137  mediastinal]
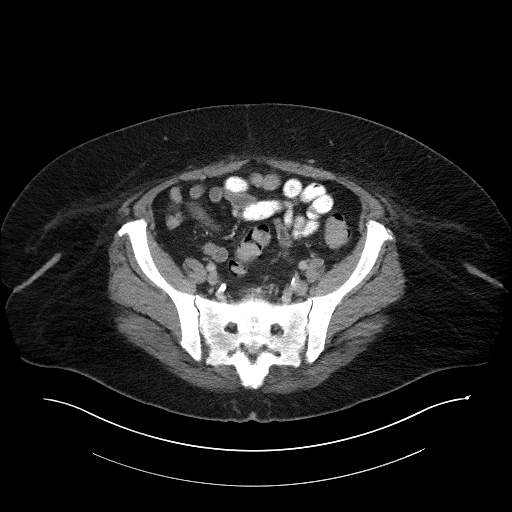
[im 50/137  mediastinal]
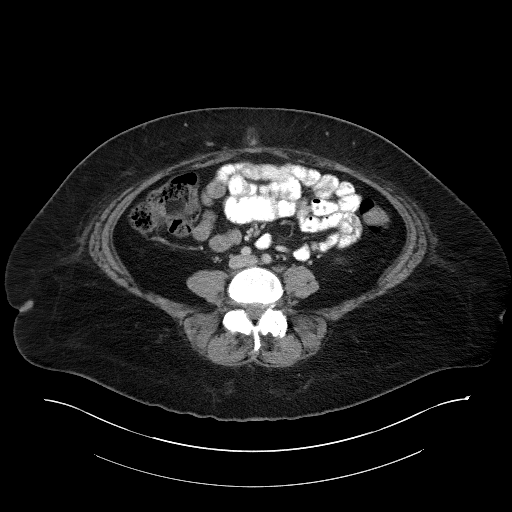
[im 62/137  mediastinal]
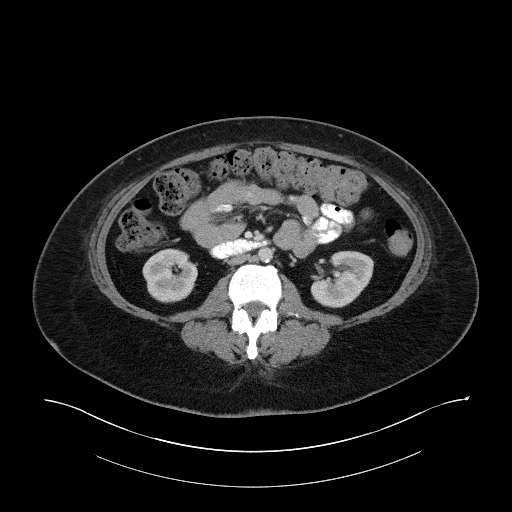
[im 75/137  mediastinal]
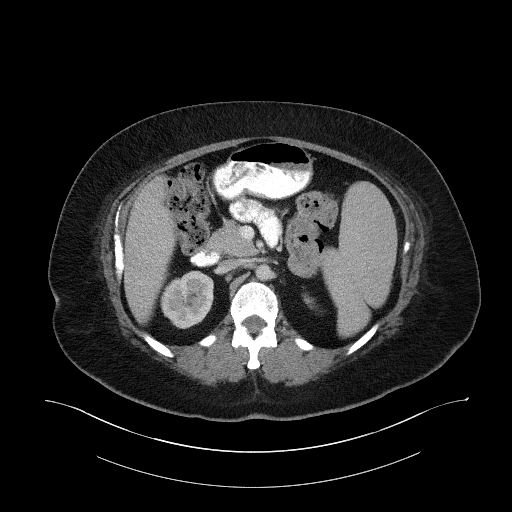
[im 87/137  mediastinal]
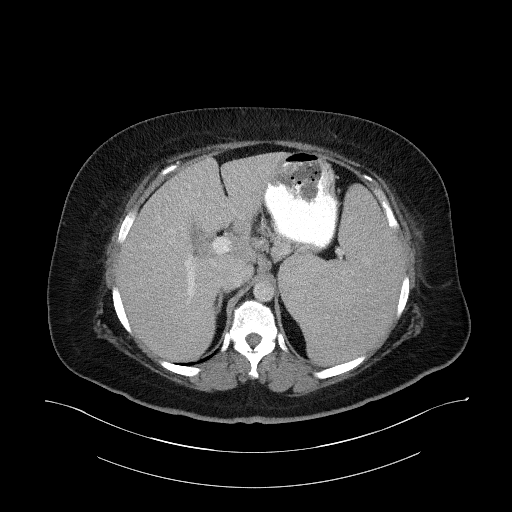
[im 99/137  mediastinal]
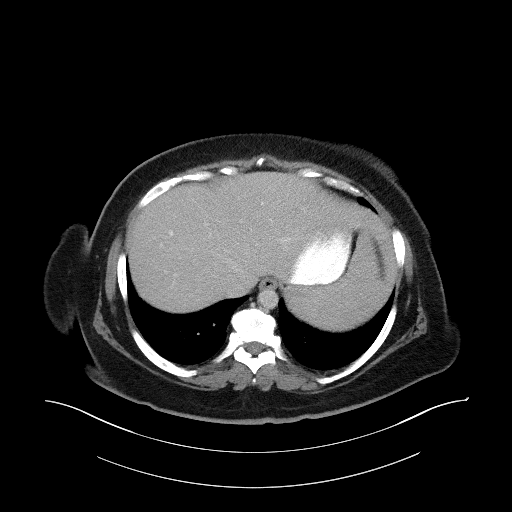
[im 112/137  mediastinal]
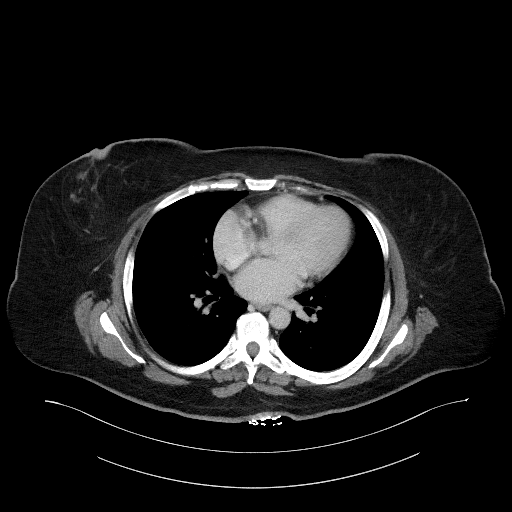
[im 112/137  bone]
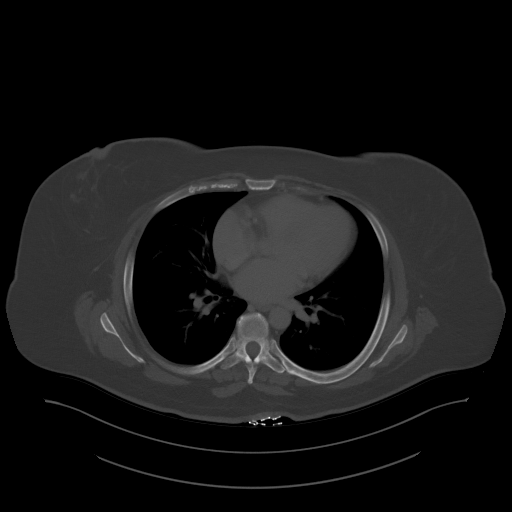
[im 124/137  mediastinal]
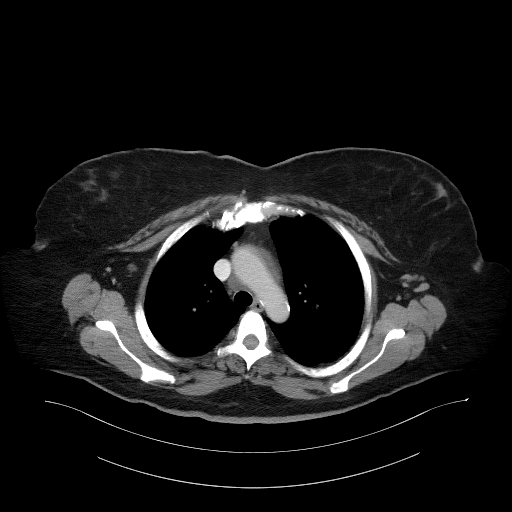

[Series 4: lung · axial · 0.98mm/px · z∈[-305,-283]mm · 2 of 126 slices shown]
[im 12/126  bone]
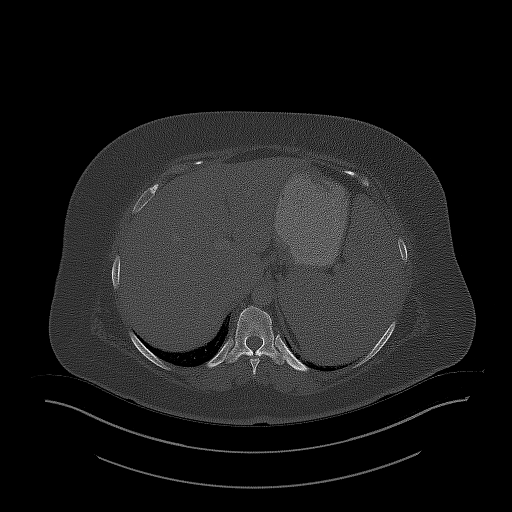
[im 23/126  bone]
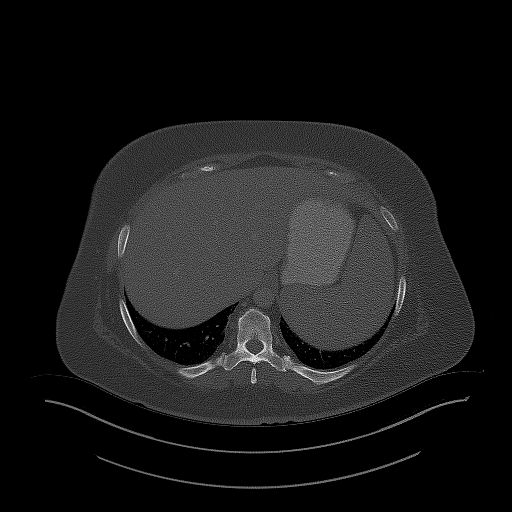

[Series 5: coronals · coronal · 1.07mm/px · 3 of 159 slices shown]
[im 32/159  mediastinal]
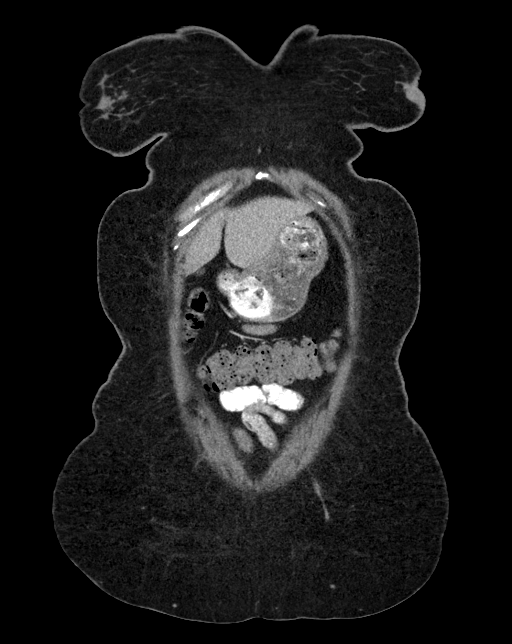
[im 64/159  mediastinal]
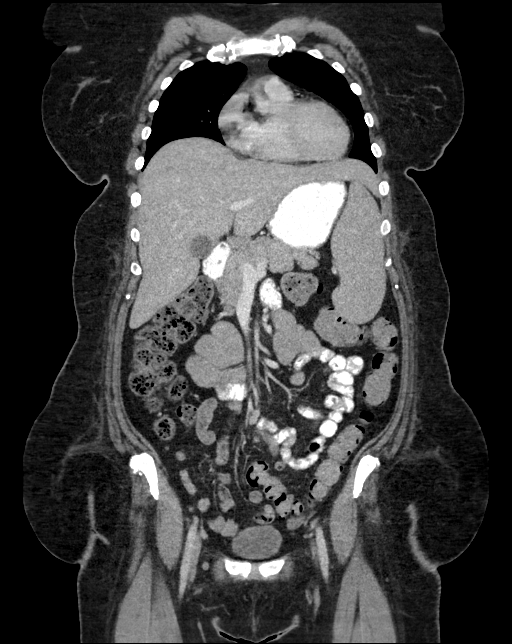
[im 95/159  mediastinal]
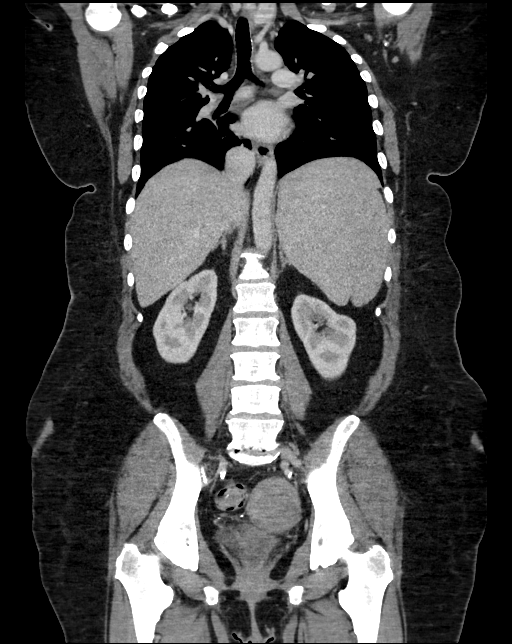

[15 of 36 positions shown; findings below may reference images not displayed]

FINDINGS: CT CHEST FINDINGS

Cardiovascular: The heart is normal in size. No pericardial
effusion. Stable mild tortuosity of the thoracic aorta and stable
calcifications at the aortic arch. Stable three-vessel coronary
artery calcifications and I suspect the patient has coronary artery
stents in place. The pulmonary arteries are unremarkable.

Mediastinum/Nodes: No mediastinal or hilar mass or lymphadenopathy.
The esophagus is grossly normal. Stable appearing large left thyroid
nodules previously evaluated with ultrasound and biopsy. This has
been evaluated on previous imaging. (ref: [HOSPITAL]. [DATE]): 143-50).

Lungs/Pleura: No acute pulmonary findings or worrisome pulmonary
lesions. No pleural effusion or pleural lesions. Minimal areas of
subpleural dependent atelectasis.

Musculoskeletal: No breast masses, supraclavicular or axillary
lymphadenopathy. Small scattered lymph nodes are stable. The bony
structures are unremarkable.

CT ABDOMEN PELVIS FINDINGS

Hepatobiliary: No hepatic lesions or intrahepatic biliary
dilatation. The gallbladder is normal. No common bile duct
dilatation.

Pancreas: No mass, inflammation or ductal dilatation.

Spleen: Splenomegaly persists and is progressive. The spleen
measures 18 x 16.5 x 12 cm (splenic volume 1,752 cubic cm). The
spleen previously measured 15 x 12 x 10 cm (splenic volume 900 cc).
No focal splenic lesions.

Adrenals/Urinary Tract: The adrenal glands and kidneys are
unremarkable. The bladder is normal.

Stomach/Bowel: The stomach, duodenum, small bowel and colon are
unremarkable. No acute inflammatory changes, mass lesions or
obstructive findings. The terminal ileum is normal. The appendix is
normal.

Moderate stool throughout the colon and down into the rectum
suggesting constipation. No colonic mass or obstruction.

Vascular/Lymphatic: The aorta and branch vessels are patent.
Scattered atherosclerotic calcifications. The major venous
structures are patent.

Small scattered mesenteric and retroperitoneal lymph nodes. No mass
or overt adenopathy. No pelvic adenopathy. No inguinal adenopathy.

Reproductive: Stable fibroid uterus.  The ovaries are unremarkable.

Other: No pelvic mass or adenopathy. No free pelvic fluid
collections. No inguinal mass or adenopathy. No abdominal wall
hernia or subcutaneous lesions.

Musculoskeletal: No significant bony findings.
IMPRESSION: 1. Progressive splenomegaly. The splenic volume has almost doubled
since [DATE]. No adenopathy in the chest, abdomen or pelvis.
3. Stable fibroid uterus.
4. Moderate stool throughout the colon and down into the rectum
suggesting constipation.

Aortic Atherosclerosis ([id number]).

## 2021-09-10 ENCOUNTER — Inpatient Hospital Stay: Payer: No Typology Code available for payment source

## 2021-09-10 ENCOUNTER — Inpatient Hospital Stay: Payer: No Typology Code available for payment source | Admitting: Hematology and Oncology

## 2021-10-23 ENCOUNTER — Other Ambulatory Visit: Payer: Self-pay | Admitting: Internal Medicine

## 2021-10-23 DIAGNOSIS — Z1231 Encounter for screening mammogram for malignant neoplasm of breast: Secondary | ICD-10-CM

## 2021-10-24 ENCOUNTER — Other Ambulatory Visit: Payer: Self-pay

## 2021-10-24 ENCOUNTER — Inpatient Hospital Stay: Payer: No Typology Code available for payment source | Attending: Hematology and Oncology

## 2021-10-24 ENCOUNTER — Encounter: Payer: Self-pay | Admitting: Hematology and Oncology

## 2021-10-24 ENCOUNTER — Inpatient Hospital Stay: Payer: No Typology Code available for payment source | Admitting: Hematology and Oncology

## 2021-10-24 DIAGNOSIS — Z7982 Long term (current) use of aspirin: Secondary | ICD-10-CM | POA: Diagnosis not present

## 2021-10-24 DIAGNOSIS — Z6839 Body mass index (BMI) 39.0-39.9, adult: Secondary | ICD-10-CM | POA: Diagnosis not present

## 2021-10-24 DIAGNOSIS — Z9221 Personal history of antineoplastic chemotherapy: Secondary | ICD-10-CM | POA: Insufficient documentation

## 2021-10-24 DIAGNOSIS — Z8572 Personal history of non-Hodgkin lymphomas: Secondary | ICD-10-CM | POA: Insufficient documentation

## 2021-10-24 DIAGNOSIS — L989 Disorder of the skin and subcutaneous tissue, unspecified: Secondary | ICD-10-CM | POA: Insufficient documentation

## 2021-10-24 DIAGNOSIS — Z7189 Other specified counseling: Secondary | ICD-10-CM

## 2021-10-24 DIAGNOSIS — C8307 Small cell B-cell lymphoma, spleen: Secondary | ICD-10-CM

## 2021-10-24 DIAGNOSIS — Z7984 Long term (current) use of oral hypoglycemic drugs: Secondary | ICD-10-CM | POA: Insufficient documentation

## 2021-10-24 DIAGNOSIS — Z79899 Other long term (current) drug therapy: Secondary | ICD-10-CM | POA: Diagnosis not present

## 2021-10-24 LAB — CBC WITH DIFFERENTIAL (CANCER CENTER ONLY)
Abs Immature Granulocytes: 0.01 10*3/uL (ref 0.00–0.07)
Basophils Absolute: 0.1 10*3/uL (ref 0.0–0.1)
Basophils Relative: 1 %
Eosinophils Absolute: 0.2 10*3/uL (ref 0.0–0.5)
Eosinophils Relative: 3 %
HCT: 45.7 % (ref 36.0–46.0)
Hemoglobin: 15.3 g/dL — ABNORMAL HIGH (ref 12.0–15.0)
Immature Granulocytes: 0 %
Lymphocytes Relative: 15 %
Lymphs Abs: 0.9 10*3/uL (ref 0.7–4.0)
MCH: 25.9 pg — ABNORMAL LOW (ref 26.0–34.0)
MCHC: 33.5 g/dL (ref 30.0–36.0)
MCV: 77.3 fL — ABNORMAL LOW (ref 80.0–100.0)
Monocytes Absolute: 0.5 10*3/uL (ref 0.1–1.0)
Monocytes Relative: 9 %
Neutro Abs: 4.1 10*3/uL (ref 1.7–7.7)
Neutrophils Relative %: 72 %
Platelet Count: 208 10*3/uL (ref 150–400)
RBC: 5.91 MIL/uL — ABNORMAL HIGH (ref 3.87–5.11)
RDW: 14.3 % (ref 11.5–15.5)
WBC Count: 5.8 10*3/uL (ref 4.0–10.5)
nRBC: 0 % (ref 0.0–0.2)

## 2021-10-24 LAB — CMP (CANCER CENTER ONLY)
ALT: 21 U/L (ref 0–44)
AST: 14 U/L — ABNORMAL LOW (ref 15–41)
Albumin: 4.4 g/dL (ref 3.5–5.0)
Alkaline Phosphatase: 85 U/L (ref 38–126)
Anion gap: 11 (ref 5–15)
BUN: 17 mg/dL (ref 6–20)
CO2: 28 mmol/L (ref 22–32)
Calcium: 9.9 mg/dL (ref 8.9–10.3)
Chloride: 100 mmol/L (ref 98–111)
Creatinine: 0.87 mg/dL (ref 0.44–1.00)
GFR, Estimated: >60 mL/min (ref >60)
Glucose, Bld: 293 mg/dL — ABNORMAL HIGH (ref 70–99)
Potassium: 4.2 mmol/L (ref 3.5–5.1)
Sodium: 139 mmol/L (ref 135–145)
Total Bilirubin: 0.8 mg/dL (ref 0.3–1.2)
Total Protein: 7.3 g/dL (ref 6.5–8.1)

## 2021-10-24 NOTE — Assessment & Plan Note (Signed)
She has recurrent skin lesion which is itchy Her doctor felt it could be related to her treatment I told the patient this is not due to her cancer or her treatment I recommend referral to dermatologist She would discuss this with her primary care doctor for evaluation and referral

## 2021-10-24 NOTE — Assessment & Plan Note (Signed)
She has completed recent treatment There is no signs of recurrent lymphoma I will see her again in 3 months for further follow-up We discussed the importance of aggressive lifestyle changes 

## 2021-10-24 NOTE — Assessment & Plan Note (Signed)
She is not able to lose any weight since last time I saw her The patient appears motivated We have extensive discussions about the importance of lifestyle changes and risk factor modification I discussed with her the increased risk of recurrence of lymphoma with obesity

## 2021-10-24 NOTE — Progress Notes (Signed)
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 There is no signs of recurrent lymphoma I will see her again in 3 months for further follow-up We discussed the importance of aggressive lifestyle changes  Skin lesion She has recurrent skin lesion which is itchy Her doctor felt it could be related to her treatment I told the patient this is not due to her cancer or her treatment I recommend referral to dermatologist She would discuss this with her primary care doctor for evaluation and referral  Class 2 severe obesity with serious comorbidity and body mass index (BMI) of 39.0 to 39.9 in adult [facility]) She is not able to lose any weight since last time I saw her The patient appears motivated We have extensive discussions about the importance of lifestyle changes and risk factor modification I discussed with her the increased risk of recurrence of lymphoma with obesity  No orders of the defined types were placed in this encounter.   All questions were answered. The patient knows to call the clinic with any problems, questions or concerns. The total time spent in the appointment was 20 minutes encounter with patients including review of chart and various tests results, discussions about plan of care and coordination of care plan   Heath Lark, MD 10/24/2021 7:32 PM  INTERVAL HISTORY: Please see below for problem oriented charting. she returns for surveillance follow-up Since last time I saw her, she complained of persistent itchiness on her abdominal wall with rash She is attempting to lose weight but fine difficult to motivate herself to exercise She is making significant changes to her diet No recent infection no new lymphadenopathy  REVIEW OF  SYSTEMS:   Constitutional: Denies fevers, chills or abnormal weight loss Eyes: Denies blurriness of vision Ears, nose, mouth, throat, and face: Denies mucositis or sore throat Respiratory: Denies cough, dyspnea or wheezes Cardiovascular: Denies palpitation, chest discomfort or lower extremity swelling Gastrointestinal:  Denies nausea, heartburn or change in bowel habits Lymphatics: Denies new lymphadenopathy or easy bruising Neurological:Denies numbness, tingling or new weaknesses Behavioral/Psych: Mood is stable, no new changes  All other systems were reviewed with the patient and are negative.  I have reviewed the past medical history, past surgical history, social history and family history with the patient and they are unchanged from previous note.  ALLERGIES:  is allergic to erythromycin, rituxan [rituximab], lovastatin, and tolnaftate.  MEDICATIONS:  Current Outpatient Medications  Medication Sig Dispense Refill   acetaminophen (TYLENOL) 500 MG tablet Take 1 tablet (500 mg total) by mouth every 8 (eight) hours as needed (pain). 30 tablet 0   aspirin 81 MG chewable tablet Chew 1 tablet (81 mg total) by mouth daily. 30 tablet 0   Bempedoic Acid (NEXLETOL) 180 MG TABS Take 1 tablet by mouth daily.     fish oil-omega-3 fatty acids 1000 MG capsule Take 1 g by mouth 2 (two) times daily.      furosemide (LASIX) 40 MG tablet Take 40 mg by mouth as needed.     hydrOXYzine (ATARAX/VISTARIL) 25 MG tablet Take 25 mg by mouth 3 (three) times daily.     losartan (COZAAR) 25 MG tablet Take 25 mg by mouth daily.     metFORMIN (GLUCOPHAGE) 1000 MG tablet Take 1,000 mg by mouth 2 (two)  times daily with a meal.       metoprolol tartrate (LOPRESSOR) 100 MG tablet Take 1 tablet (100 mg total) by mouth 2 (two) times daily. PT OVERDUE FOR OV PLEASE CALL FOR APPT 60 tablet 0   nitroGLYCERIN (NITROSTAT) 0.4 MG SL tablet Place 1 tablet (0.4 mg total) under the tongue every 5 (five) minutes as needed for  chest pain. 30 tablet 0   ondansetron (ZOFRAN) 8 MG tablet Take 1 tablet (8 mg total) by mouth every 8 (eight) hours as needed for refractory nausea / vomiting. Start on day 2 after bendamustine chemo. 30 tablet 1   prochlorperazine (COMPAZINE) 10 MG tablet Take 1 tablet (10 mg total) by mouth every 6 (six) hours as needed (Nausea or vomiting). 30 tablet 1   TRULICITY 4.5 HE/1.7EY SOPN Inject 4.5 mg into the skin once a week.     No current facility-administered medications for this visit.    SUMMARY OF ONCOLOGIC HISTORY: Oncology History  Splenic marginal zone b-cell lymphoma ([place])  01/11/2009 Initial Diagnosis   Splenic marginal zone b-cell lymphoma   06/06/2014 Bone Marrow Biopsy   Bone marrow aspirate and biopsy confirmed extensive involvement by CD20 positive non-Hodgkin's lymphoma.   06/07/2014 Imaging   She had a PET scan which showed predominant splenic involvement.   06/16/2014 - 07/07/2014 Chemotherapy   She started on weekly rituximab.   06/16/2014 Adverse Reaction   She had infusion reaction, resolved with IV dexamethasone.   08/18/2014 Imaging   PET CT scan show resolution of hypermetabolic activity.   04/08/2016 Imaging   1. No evidence for aortic dissection or vascular injury. 2. Mild atherosclerotic changes within the abdominal aorta and branch vessels without aneurysm. 3. No significant adenopathy or evidence for recurrent lymphoma. 4. Stable mild splenomegaly. 5. 5 mm nodule in the right lower lobe demonstrates interval increase in size. No follow-up needed if patient is low-risk. Non-contrast chest CT can be considered in 12 months if patient is high-risk.  6. Degenerate changes at the SI joints bilaterally.   01/21/2018 Imaging   1. Persistent mild splenomegaly, similar to prior examinations. No other findings to suggest recurrent disease in the chest, abdomen or pelvis. 2. Aortic atherosclerosis, in addition to two vessel coronary artery disease. Please note that  although the presence of coronary artery calcium documents the presence of coronary artery disease, the severity of this disease and any potential stenosis cannot be assessed on this non-gated CT examination. Assessment for potential risk factor modification, dietary therapy or pharmacologic therapy may be warranted, if clinically indicated.  3. Enlarging heterogeneously enhancing and partially calcified left thyroid lobe nodule. Further evaluation with nonemergent thyroid ultrasound is recommended in the near future to better evaluate this lesion and determine if there is a need for fine-needle aspiration. 4. Additional incidental findings, as above.   09/28/2020 Imaging   1. Progressive splenomegaly. The splenic volume has almost doubled since 2019. 2. No adenopathy in the chest, abdomen or pelvis. 3. Stable fibroid uterus. 4. Moderate stool throughout the colon and down into the rectum suggesting constipation.   Aortic Atherosclerosis (ICD10-I70.0).   09/28/2020 Cancer Staging   Staging form: Hodgkin and Non-Hodgkin Lymphoma, AJCC 8th Edition - Clinical stage from 09/28/2020: Stage IE (Marginal zone lymphoma) - Signed by Heath Lark, MD on 09/28/2020    10/15/2020 - 04/10/2021 Chemotherapy    Patient received bendamustine x 6 cycles.    05/10/2021 Imaging   1. No lymphadenopathy or other findings of recurrent lymphoma in the  abdomen or pelvis. Spleen is normal size, decreased from prior. 2. Mild diffuse hepatic steatosis. 3. Mild sigmoid diverticulosis. 4. Enlarged myomatous uterus. 5. Coronary atherosclerosis. 6. Aortic Atherosclerosis (ICD10-I70.0).     PHYSICAL EXAMINATION: ECOG PERFORMANCE STATUS: 1 - Symptomatic but completely ambulatory  Vitals:   10/24/21 0759  BP: 135/80  Pulse: 98  Resp: 18  Temp: (!) 97.4 F (36.3 C)  SpO2: 97%   Filed Weights   10/24/21 0759  Weight: 254 lb 6.4 oz (115.4 kg)    GENERAL:alert, no distress and comfortable SKIN: Noted skin rash  on her abdominal wall not consistent with allergic reaction NEURO: alert & oriented x 3 with fluent speech, no focal motor/sensory deficits  LABORATORY DATA:  I have reviewed the data as listed    Component Value Date/Time   NA 139 10/24/2021 0732   NA 138 07/24/2017 1105   K 4.2 10/24/2021 0732   K 4.2 07/24/2017 1105   CL 100 10/24/2021 0732   CL 106 11/19/2012 0837   CO2 28 10/24/2021 0732   CO2 26 07/24/2017 1105   GLUCOSE 293 (H) 10/24/2021 0732   GLUCOSE 362 (H) 07/24/2017 1105   GLUCOSE 107 (H) 11/19/2012 0837   BUN 17 10/24/2021 0732   BUN 10.0 07/24/2017 1105   CREATININE 0.87 10/24/2021 0732   CREATININE 0.8 07/24/2017 1105   CALCIUM 9.9 10/24/2021 0732   CALCIUM 8.8 07/24/2017 1105   PROT 7.3 10/24/2021 0732   PROT 6.3 (L) 07/24/2017 1105   ALBUMIN 4.4 10/24/2021 0732   ALBUMIN 3.4 (L) 07/24/2017 1105   AST 14 (L) 10/24/2021 0732   AST 13 07/24/2017 1105   ALT 21 10/24/2021 0732   ALT 21 07/24/2017 1105   ALKPHOS 85 10/24/2021 0732   ALKPHOS 112 07/24/2017 1105   BILITOT 0.8 10/24/2021 0732   BILITOT 0.55 07/24/2017 1105   GFRNONAA >60 10/24/2021 0732   GFRAA >60 05/18/2020 0825   GFRAA >60 10/22/2018 0809    No results found for: SPEP, UPEP  Lab Results  Component Value Date   WBC 5.8 10/24/2021   NEUTROABS 4.1 10/24/2021   HGB 15.3 (H) 10/24/2021   HCT 45.7 10/24/2021   MCV 77.3 (L) 10/24/2021   PLT 208 10/24/2021      Chemistry      Component Value Date/Time   NA 139 10/24/2021 0732   NA 138 07/24/2017 1105   K 4.2 10/24/2021 0732   K 4.2 07/24/2017 1105   CL 100 10/24/2021 0732   CL 106 11/19/2012 0837   CO2 28 10/24/2021 0732   CO2 26 07/24/2017 1105   BUN 17 10/24/2021 0732   BUN 10.0 07/24/2017 1105   CREATININE 0.87 10/24/2021 0732   CREATININE 0.8 07/24/2017 1105      Component Value Date/Time   CALCIUM 9.9 10/24/2021 0732   CALCIUM 8.8 07/24/2017 1105   ALKPHOS 85 10/24/2021 0732   ALKPHOS 112 07/24/2017 1105   AST 14 (L)  10/24/2021 0732   AST 13 07/24/2017 1105   ALT 21 10/24/2021 0732   ALT 21 07/24/2017 1105   BILITOT 0.8 10/24/2021 0732   BILITOT 0.55 07/24/2017 1105

## 2021-11-06 ENCOUNTER — Ambulatory Visit
Admission: RE | Admit: 2021-11-06 | Discharge: 2021-11-06 | Disposition: A | Discharge status: Home / Self Care | Payer: No Typology Code available for payment source | Source: Ambulatory Visit | Attending: Internal Medicine | Admitting: Internal Medicine

## 2021-11-06 DIAGNOSIS — Z1231 Encounter for screening mammogram for malignant neoplasm of breast: Secondary | ICD-10-CM

## 2021-11-06 HISTORY — DX: Personal history of antineoplastic chemotherapy: Z92.21

## 2022-01-24 ENCOUNTER — Inpatient Hospital Stay: Payer: No Typology Code available for payment source | Attending: Hematology and Oncology

## 2022-01-24 ENCOUNTER — Encounter: Payer: Self-pay | Admitting: Hematology and Oncology

## 2022-01-24 ENCOUNTER — Inpatient Hospital Stay: Payer: No Typology Code available for payment source | Admitting: Hematology and Oncology

## 2022-01-24 ENCOUNTER — Other Ambulatory Visit: Payer: Self-pay

## 2022-01-24 DIAGNOSIS — C8307 Small cell B-cell lymphoma, spleen: Secondary | ICD-10-CM | POA: Diagnosis not present

## 2022-01-24 DIAGNOSIS — E118 Type 2 diabetes mellitus with unspecified complications: Secondary | ICD-10-CM | POA: Diagnosis not present

## 2022-01-24 DIAGNOSIS — Z8572 Personal history of non-Hodgkin lymphomas: Secondary | ICD-10-CM | POA: Diagnosis not present

## 2022-01-24 DIAGNOSIS — Z6841 Body Mass Index (BMI) 40.0 and over, adult: Secondary | ICD-10-CM

## 2022-01-24 DIAGNOSIS — Z79899 Other long term (current) drug therapy: Secondary | ICD-10-CM | POA: Diagnosis not present

## 2022-01-24 DIAGNOSIS — Z7189 Other specified counseling: Secondary | ICD-10-CM

## 2022-01-24 LAB — CBC WITH DIFFERENTIAL (CANCER CENTER ONLY)
Abs Immature Granulocytes: 0.04 10*3/uL (ref 0.00–0.07)
Basophils Absolute: 0.1 10*3/uL (ref 0.0–0.1)
Basophils Relative: 1 %
Eosinophils Absolute: 0.2 10*3/uL (ref 0.0–0.5)
Eosinophils Relative: 2 %
HCT: 43.2 % (ref 36.0–46.0)
Hemoglobin: 14.7 g/dL (ref 12.0–15.0)
Immature Granulocytes: 1 %
Lymphocytes Relative: 14 %
Lymphs Abs: 1.1 10*3/uL (ref 0.7–4.0)
MCH: 26.2 pg (ref 26.0–34.0)
MCHC: 34 g/dL (ref 30.0–36.0)
MCV: 76.9 fL — ABNORMAL LOW (ref 80.0–100.0)
Monocytes Absolute: 0.6 10*3/uL (ref 0.1–1.0)
Monocytes Relative: 8 %
Neutro Abs: 5.8 10*3/uL (ref 1.7–7.7)
Neutrophils Relative %: 74 %
Platelet Count: 181 10*3/uL (ref 150–400)
RBC: 5.62 MIL/uL — ABNORMAL HIGH (ref 3.87–5.11)
RDW: 14.1 % (ref 11.5–15.5)
WBC Count: 7.8 10*3/uL (ref 4.0–10.5)
nRBC: 0 % (ref 0.0–0.2)

## 2022-01-24 LAB — CMP (CANCER CENTER ONLY)
ALT: 27 U/L (ref 0–44)
AST: 16 U/L (ref 15–41)
Albumin: 4.2 g/dL (ref 3.5–5.0)
Alkaline Phosphatase: 97 U/L (ref 38–126)
Anion gap: 10 (ref 5–15)
BUN: 17 mg/dL (ref 6–20)
CO2: 28 mmol/L (ref 22–32)
Calcium: 9.1 mg/dL (ref 8.9–10.3)
Chloride: 98 mmol/L (ref 98–111)
Creatinine: 0.89 mg/dL (ref 0.44–1.00)
GFR, Estimated: >60 mL/min (ref >60)
Glucose, Bld: 373 mg/dL — ABNORMAL HIGH (ref 70–99)
Potassium: 3.9 mmol/L (ref 3.5–5.1)
Sodium: 136 mmol/L (ref 135–145)
Total Bilirubin: 0.7 mg/dL (ref 0.3–1.2)
Total Protein: 6.8 g/dL (ref 6.5–8.1)

## 2022-01-24 NOTE — Assessment & Plan Note (Signed)
The patient has difficulties losing weight ?She has gained weight since last time I saw her ?She is motivated but need resources ?I gave her additional resources to read ?I am hopeful she can be successful with her weight management ?I will see her again in 3 months for follow-up ?

## 2022-01-24 NOTE — Progress Notes (Signed)
[facility] ?OFFICE PROGRESS NOTE ? ?Patient Care Team: ?Lennie Odor, PA as PCP - General (Nurse Practitioner) ?Heath Lark, MD as Consulting Physician (Hematology and Oncology) ? ?ASSESSMENT & PLAN:  ?Splenic marginal zone b-cell lymphoma ([place]) ?She has completed recent treatment ?There is no signs of recurrent lymphoma ?I will see her again in 3 months for further follow-up ?We discussed the importance of aggressive lifestyle changes ? ?Type 2 diabetes mellitus with complication, without long-term current use of insulin ([place]) ?She has severe, uncontrolled diabetes ?The patient is motivated to make lifestyle changes but has not experienced any good success ?She will continue medical management with her primary care doctor ?I have given her additional resources to read ? ?Class 3 severe obesity due to excess calories with serious comorbidity in adult [facility]) ?The patient has difficulties losing weight ?She has gained weight since last time I saw her ?She is motivated but need resources ?I gave her additional resources to read ?I am hopeful she can be successful with her weight management ?I will see her again in 3 months for follow-up ? ?No orders of the defined types were placed in this encounter. ? ? ?All questions were answered. The patient knows to call the clinic with any problems, questions or concerns. ?The total time spent in the appointment was 20 minutes encounter with patients including review of chart and various tests results, discussions about plan of care and coordination of care plan ?  ?Heath Lark, MD ?01/24/2022 11:13 AM ? ?INTERVAL HISTORY: ?Please see below for problem oriented charting. ?she returns for treatment follow-up for history of recurrent splenic marginal zone lymphoma ?The patient is disappointed ?She has gained a lot of weight since last time I saw her ?Denies lymphadenopathy or recent infection ?She will like additional resources to help her lose weight ? ?REVIEW OF  SYSTEMS:   ?Constitutional: Denies fevers, chills or abnormal weight loss ?Eyes: Denies blurriness of vision ?Ears, nose, mouth, throat, and face: Denies mucositis or sore throat ?Respiratory: Denies cough, dyspnea or wheezes ?Cardiovascular: Denies palpitation, chest discomfort or lower extremity swelling ?Gastrointestinal:  Denies nausea, heartburn or change in bowel habits ?Skin: Denies abnormal skin rashes ?Lymphatics: Denies new lymphadenopathy or easy bruising ?Neurological:Denies numbness, tingling or new weaknesses ?Behavioral/Psych: Mood is stable, no new changes  ?All other systems were reviewed with the patient and are negative. ? ?I have reviewed the past medical history, past surgical history, social history and family history with the patient and they are unchanged from previous note. ? ?ALLERGIES:  is allergic to erythromycin, rituxan [rituximab], lovastatin, and tolnaftate. ? ?MEDICATIONS:  ?Current Outpatient Medications  ?Medication Sig Dispense Refill  ? acetaminophen (TYLENOL) 500 MG tablet Take 1 tablet (500 mg total) by mouth every 8 (eight) hours as needed (pain). 30 tablet 0  ? aspirin 81 MG chewable tablet Chew 1 tablet (81 mg total) by mouth daily. 30 tablet 0  ? Bempedoic Acid (NEXLETOL) 180 MG TABS Take 1 tablet by mouth daily.    ? fish oil-omega-3 fatty acids 1000 MG capsule Take 1 g by mouth 2 (two) times daily.     ? furosemide (LASIX) 40 MG tablet Take 40 mg by mouth as needed.    ? hydrOXYzine (ATARAX/VISTARIL) 25 MG tablet Take 25 mg by mouth 3 (three) times daily.    ? losartan (COZAAR) 25 MG tablet Take 25 mg by mouth daily.    ? metFORMIN (GLUCOPHAGE) 1000 MG tablet Take 1,000 mg by mouth 2 (two) times  daily with a meal.      ? metoprolol tartrate (LOPRESSOR) 100 MG tablet Take 1 tablet (100 mg total) by mouth 2 (two) times daily. PT OVERDUE FOR OV PLEASE CALL FOR APPT 60 tablet 0  ? nitroGLYCERIN (NITROSTAT) 0.4 MG SL tablet Place 1 tablet (0.4 mg total) under the tongue every  5 (five) minutes as needed for chest pain. 30 tablet 0  ? ondansetron (ZOFRAN) 8 MG tablet Take 1 tablet (8 mg total) by mouth every 8 (eight) hours as needed for refractory nausea / vomiting. Start on day 2 after bendamustine chemo. 30 tablet 1  ? prochlorperazine (COMPAZINE) 10 MG tablet Take 1 tablet (10 mg total) by mouth every 6 (six) hours as needed (Nausea or vomiting). 30 tablet 1  ? TRULICITY 4.5 WG/9.5AO SOPN Inject 4.5 mg into the skin once a week.    ? ?No current facility-administered medications for this visit.  ? ? ?SUMMARY OF ONCOLOGIC HISTORY: ?Oncology History  ?Splenic marginal zone b-cell lymphoma ([place])  ?01/11/2009 Initial Diagnosis  ? Splenic marginal zone b-cell lymphoma ?  ?06/06/2014 Bone Marrow Biopsy  ? Bone marrow aspirate and biopsy confirmed extensive involvement by CD20 positive non-Hodgkin's lymphoma. ?  ?06/07/2014 Imaging  ? She had a PET scan which showed predominant splenic involvement. ?  ?06/16/2014 - 07/07/2014 Chemotherapy  ? She started on weekly rituximab. ?  ?06/16/2014 Adverse Reaction  ? She had infusion reaction, resolved with IV dexamethasone. ?  ?08/18/2014 Imaging  ? PET CT scan show resolution of hypermetabolic activity. ?  ?04/08/2016 Imaging  ? 1. No evidence for aortic dissection or vascular injury. ?2. Mild atherosclerotic changes within the abdominal aorta and branch vessels without aneurysm. ?3. No significant adenopathy or evidence for recurrent lymphoma. ?4. Stable mild splenomegaly. ?5. 5 mm nodule in the right lower lobe demonstrates interval increase in size. No follow-up needed if patient is low-risk. Non-contrast chest CT can be considered in 12 months if patient is high-risk.  ?6. Degenerate changes at the SI joints bilaterally. ?  ?01/21/2018 Imaging  ? 1. Persistent mild splenomegaly, similar to prior examinations. No other findings to suggest recurrent disease in the chest, abdomen or pelvis. ?2. Aortic atherosclerosis, in addition to two vessel coronary artery  disease. Please note that although the presence of coronary artery calcium documents the presence of coronary artery disease, the severity of this disease and any potential stenosis cannot be assessed on this non-gated CT examination. Assessment for potential risk factor modification, dietary therapy or pharmacologic therapy may be warranted, if clinically indicated.  ?3. Enlarging heterogeneously enhancing and partially calcified left thyroid lobe nodule. Further evaluation with nonemergent thyroid ultrasound is recommended in the near future to better evaluate this lesion and determine if there is a need for fine-needle aspiration. ?4. Additional incidental findings, as above. ?  ?09/28/2020 Imaging  ? 1. Progressive splenomegaly. The splenic volume has almost doubled since 2019. ?2. No adenopathy in the chest, abdomen or pelvis. ?3. Stable fibroid uterus. ?4. Moderate stool throughout the colon and down into the rectum suggesting constipation. ?  ?Aortic Atherosclerosis (ICD10-I70.0). ?  ?09/28/2020 Cancer Staging  ? Staging form: Hodgkin and Non-Hodgkin Lymphoma, AJCC 8th Edition ?- Clinical stage from 09/28/2020: Stage IE (Marginal zone lymphoma) - Signed by Heath Lark, MD on 09/28/2020 ? ?  ?10/15/2020 - 04/10/2021 Chemotherapy  ?  Patient received bendamustine x 6 cycles.  ?  ?05/10/2021 Imaging  ? 1. No lymphadenopathy or other findings of recurrent lymphoma in the abdomen  or pelvis. Spleen is normal size, decreased from prior. ?2. Mild diffuse hepatic steatosis. ?3. Mild sigmoid diverticulosis. ?4. Enlarged myomatous uterus. ?5. Coronary atherosclerosis. ?6. Aortic Atherosclerosis (ICD10-I70.0). ?  ? ? ?PHYSICAL EXAMINATION: ?ECOG PERFORMANCE STATUS: 0 - Asymptomatic ? ?Vitals:  ? 01/24/22 0803  ?BP: 129/82  ?Pulse: (!) 111  ?Resp: 16  ?Temp: 98.5 ?F (36.9 ?C)  ?SpO2: 99%  ? ?Filed Weights  ? 01/24/22 0803  ?Weight: 259 lb (117.5 kg)  ? ? ?GENERAL:alert, no distress and comfortable ?NEURO: alert & oriented x 3  with fluent speech, no focal motor/sensory deficits ? ?LABORATORY DATA:  ?I have reviewed the data as listed ?   ?Component Value Date/Time  ? NA 136 01/24/2022 0731  ? NA 138 07/24/2017 1105  ? K 3.9 0

## 2022-01-24 NOTE — Assessment & Plan Note (Signed)
She has severe, uncontrolled diabetes ?The patient is motivated to make lifestyle changes but has not experienced any good success ?She will continue medical management with her primary care doctor ?I have given her additional resources to read ?

## 2022-01-24 NOTE — Assessment & Plan Note (Signed)
She has completed recent treatment There is no signs of recurrent lymphoma I will see her again in 3 months for further follow-up We discussed the importance of aggressive lifestyle changes 

## 2022-03-31 IMAGING — CT CT ABD-PELV W/O CM
2 of 4 series · 16 of 46 positions shown, 18 images · non-contrast
Comparison: 09/28/2020 CT chest, abdomen and pelvis.

CLINICAL DATA: Non-Hodgkin's lymphoma diagnosed 7696 status post
radiation therapy and chemotherapy. Restaging.

EXAM:
CT ABDOMEN AND PELVIS WITHOUT CONTRAST
TECHNIQUE: Multidetector CT imaging of the abdomen and pelvis was performed
following the standard protocol without IV contrast.

[Series 2: axial st · axial · 0.98mm/px · z∈[-554,-84]mm · 13 of 108 slices shown, 15 images]
[im 7/108  soft-tissue]
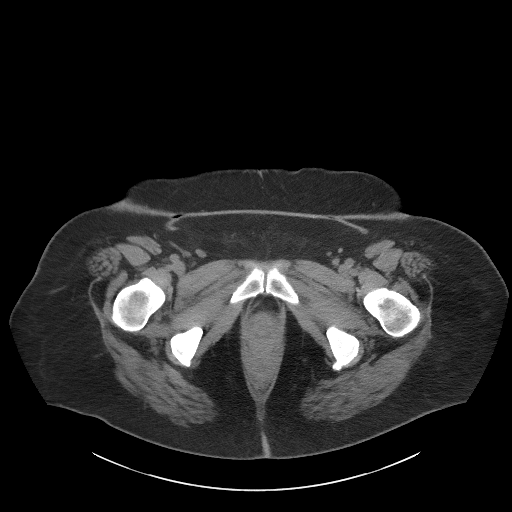
[im 7/108  bone]
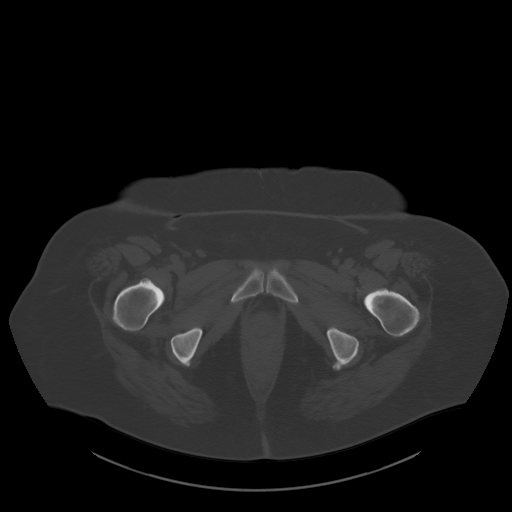
[im 13/108  soft-tissue]
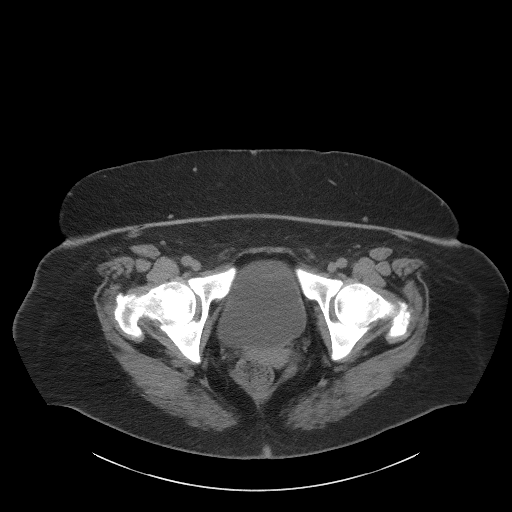
[im 26/108  soft-tissue]
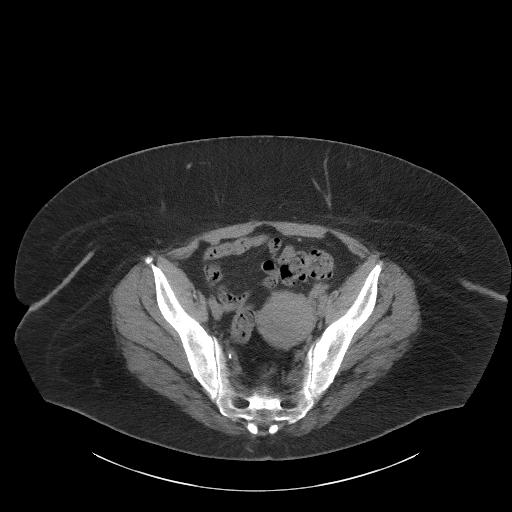
[im 32/108  soft-tissue]
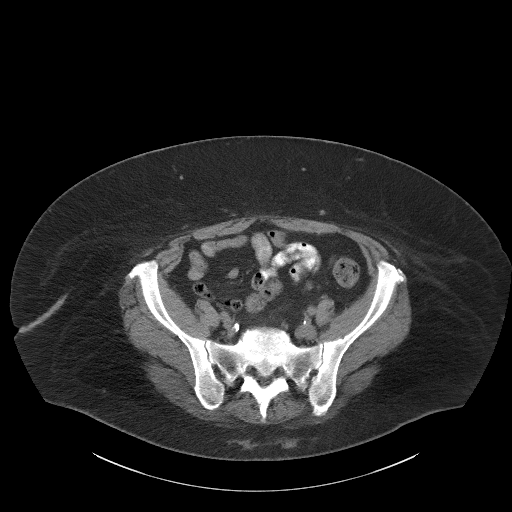
[im 38/108  soft-tissue]
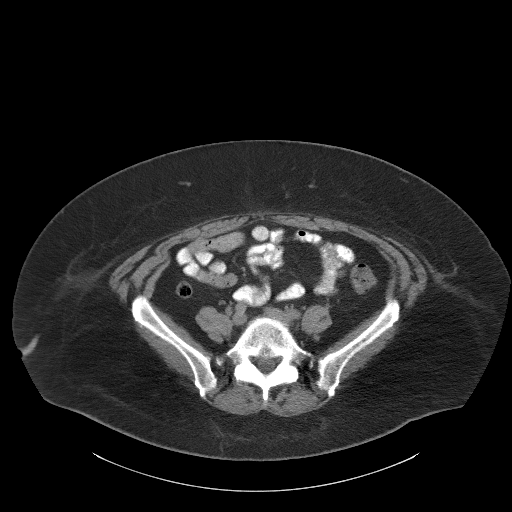
[im 45/108  soft-tissue]
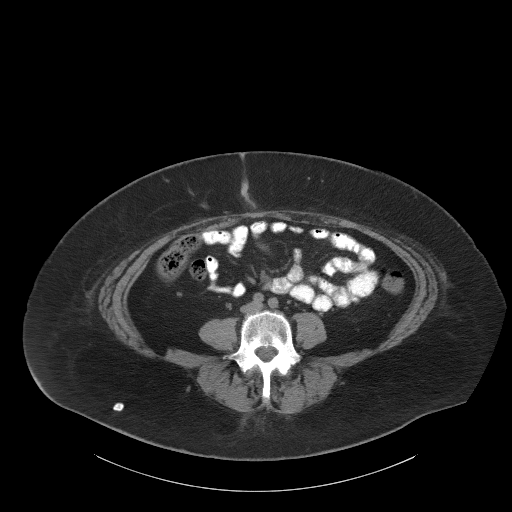
[im 57/108  soft-tissue]
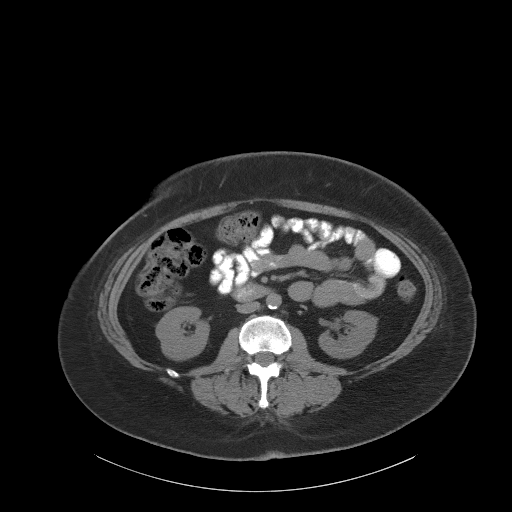
[im 63/108  soft-tissue]
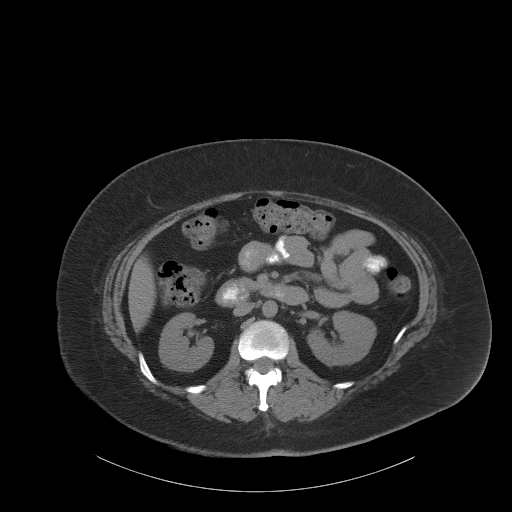
[im 70/108  soft-tissue]
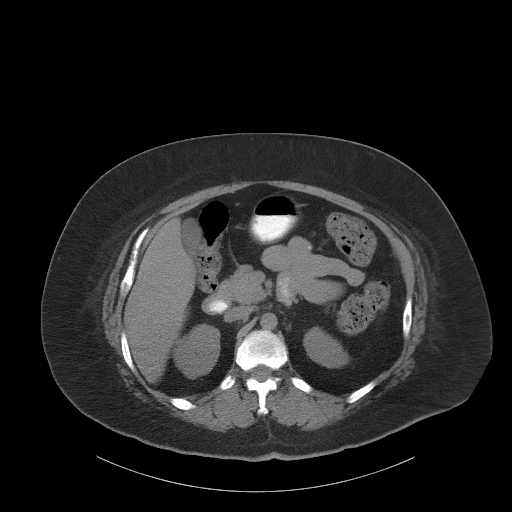
[im 70/108  bone]
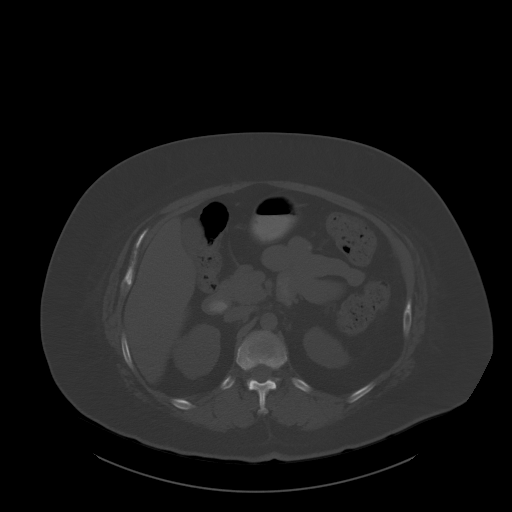
[im 76/108  soft-tissue]
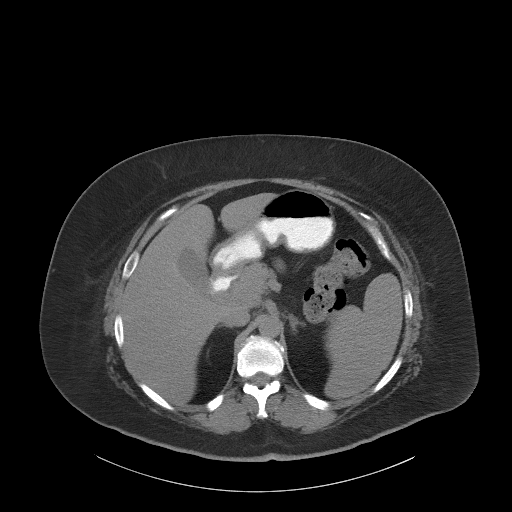
[im 82/108  soft-tissue]
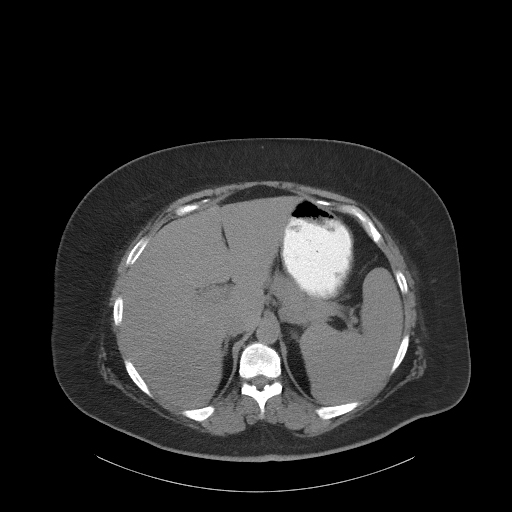
[im 95/108  soft-tissue]
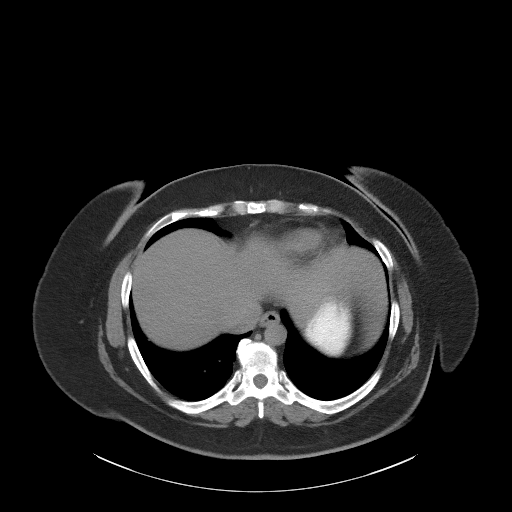
[im 101/108  soft-tissue]
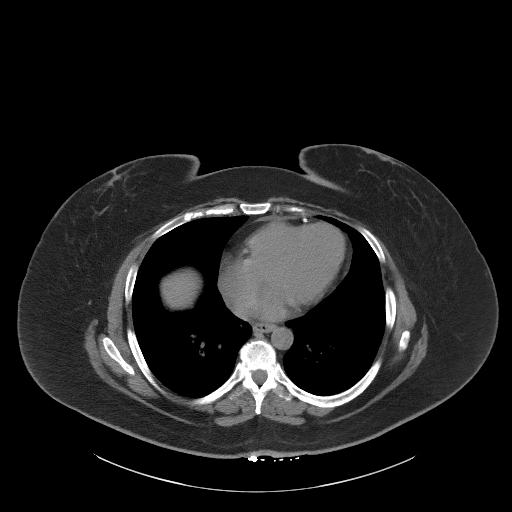

[Series 4: coronal st · coronal · 0.86mm/px · 3 of 116 slices shown]
[im 39/116  soft-tissue]
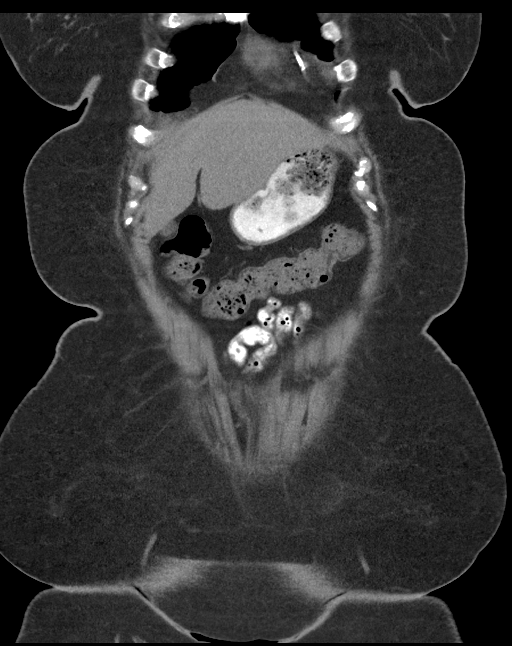
[im 52/116  soft-tissue]
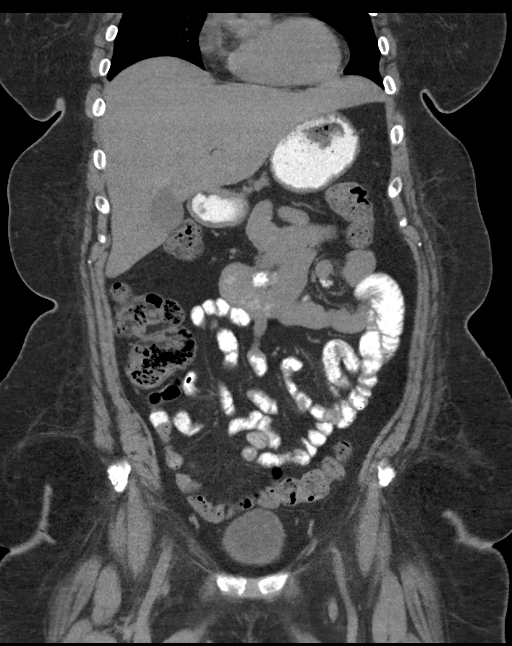
[im 64/116  soft-tissue]
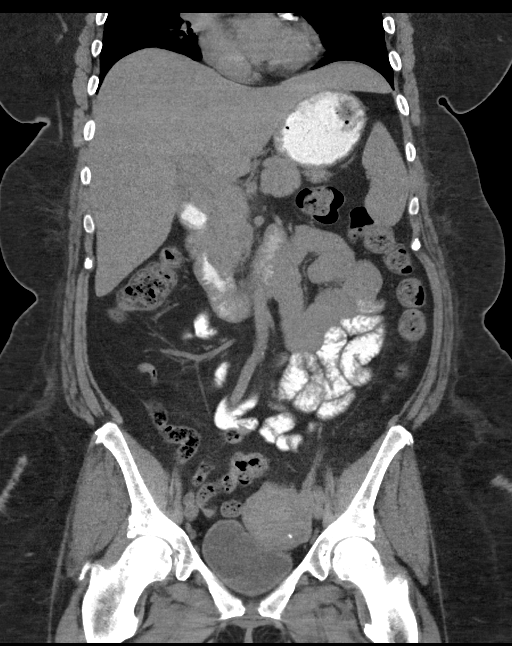

[16 of 46 positions shown; findings below may reference images not displayed]

FINDINGS: Lower chest: No significant pulmonary nodules or acute consolidative
airspace disease. Coronary atherosclerosis.

Hepatobiliary: Mild diffuse hepatic steatosis. No definite liver
surface irregularity. Normal liver size. No liver masses. Normal
gallbladder with no radiopaque cholelithiasis. No biliary ductal
dilatation.

Pancreas: Normal, with no mass or duct dilation.

Spleen: Spleen normal size, craniocaudal splenic length 10.9 cm,
decreased from 16.5 cm. No splenic mass.

Adrenals/Urinary Tract: Normal adrenals. No renal stones. No
hydronephrosis. Normal bladder.

Stomach/Bowel: Normal non-distended stomach. Normal caliber small
bowel with no small bowel wall thickening. Normal appendix. Mild
sigmoid diverticulosis with no large bowel wall thickening or
significant pericolonic fat stranding.

Vascular/Lymphatic: Atherosclerotic nonaneurysmal abdominal aorta.
No pathologically enlarged lymph nodes in the abdomen or pelvis.

Reproductive: Enlarged myomatous uterus with suggestion of a
dominant 5.6 cm anterior uterine fibroid, similar. No adnexal masses
proved

Other: No pneumoperitoneum, ascites or focal fluid collection.

Musculoskeletal: No aggressive appearing focal osseous lesions. Mild
thoracolumbar spondylosis.
IMPRESSION: 1. No lymphadenopathy or other findings of recurrent lymphoma in the
abdomen or pelvis. Spleen is normal size, decreased from prior.
2. Mild diffuse hepatic steatosis.
3. Mild sigmoid diverticulosis.
4. Enlarged myomatous uterus.
5. Coronary atherosclerosis.
6. Aortic Atherosclerosis ([id number]).

## 2022-04-24 ENCOUNTER — Inpatient Hospital Stay: Payer: No Typology Code available for payment source | Attending: Hematology and Oncology

## 2022-04-24 ENCOUNTER — Inpatient Hospital Stay: Payer: No Typology Code available for payment source | Admitting: Hematology and Oncology

## 2022-04-24 ENCOUNTER — Other Ambulatory Visit: Payer: Self-pay

## 2022-04-24 ENCOUNTER — Encounter: Payer: Self-pay | Admitting: Hematology and Oncology

## 2022-04-24 DIAGNOSIS — E118 Type 2 diabetes mellitus with unspecified complications: Secondary | ICD-10-CM | POA: Diagnosis not present

## 2022-04-24 DIAGNOSIS — Z8572 Personal history of non-Hodgkin lymphomas: Secondary | ICD-10-CM | POA: Diagnosis present

## 2022-04-24 DIAGNOSIS — C8307 Small cell B-cell lymphoma, spleen: Secondary | ICD-10-CM | POA: Diagnosis not present

## 2022-04-24 DIAGNOSIS — Z7189 Other specified counseling: Secondary | ICD-10-CM

## 2022-04-24 DIAGNOSIS — E1165 Type 2 diabetes mellitus with hyperglycemia: Secondary | ICD-10-CM | POA: Insufficient documentation

## 2022-04-24 DIAGNOSIS — Z6841 Body Mass Index (BMI) 40.0 and over, adult: Secondary | ICD-10-CM | POA: Diagnosis not present

## 2022-04-24 LAB — CBC WITH DIFFERENTIAL (CANCER CENTER ONLY)
Abs Immature Granulocytes: 0.02 10*3/uL (ref 0.00–0.07)
Basophils Absolute: 0.1 10*3/uL (ref 0.0–0.1)
Basophils Relative: 1 %
Eosinophils Absolute: 0.2 10*3/uL (ref 0.0–0.5)
Eosinophils Relative: 4 %
HCT: 45.3 % (ref 36.0–46.0)
Hemoglobin: 15.3 g/dL — ABNORMAL HIGH (ref 12.0–15.0)
Immature Granulocytes: 0 %
Lymphocytes Relative: 15 %
Lymphs Abs: 1 10*3/uL (ref 0.7–4.0)
MCH: 26.3 pg (ref 26.0–34.0)
MCHC: 33.8 g/dL (ref 30.0–36.0)
MCV: 77.8 fL — ABNORMAL LOW (ref 80.0–100.0)
Monocytes Absolute: 0.5 10*3/uL (ref 0.1–1.0)
Monocytes Relative: 8 %
Neutro Abs: 4.7 10*3/uL (ref 1.7–7.7)
Neutrophils Relative %: 72 %
Platelet Count: 193 10*3/uL (ref 150–400)
RBC: 5.82 MIL/uL — ABNORMAL HIGH (ref 3.87–5.11)
RDW: 13.9 % (ref 11.5–15.5)
WBC Count: 6.5 10*3/uL (ref 4.0–10.5)
nRBC: 0 % (ref 0.0–0.2)

## 2022-04-24 LAB — CMP (CANCER CENTER ONLY)
ALT: 44 U/L (ref 0–44)
AST: 22 U/L (ref 15–41)
Albumin: 4.5 g/dL (ref 3.5–5.0)
Alkaline Phosphatase: 104 U/L (ref 38–126)
Anion gap: 11 (ref 5–15)
BUN: 19 mg/dL (ref 6–20)
CO2: 29 mmol/L (ref 22–32)
Calcium: 9.9 mg/dL (ref 8.9–10.3)
Chloride: 98 mmol/L (ref 98–111)
Creatinine: 0.98 mg/dL (ref 0.44–1.00)
GFR, Estimated: >60 mL/min (ref >60)
Glucose, Bld: 395 mg/dL — ABNORMAL HIGH (ref 70–99)
Potassium: 4.1 mmol/L (ref 3.5–5.1)
Sodium: 138 mmol/L (ref 135–145)
Total Bilirubin: 0.8 mg/dL (ref 0.3–1.2)
Total Protein: 7.2 g/dL (ref 6.5–8.1)

## 2022-04-24 NOTE — Assessment & Plan Note (Signed)
She has severe, uncontrolled diabetes The patient is motivated to make lifestyle changes but has not experienced any good success She will continue medical management with her primary care doctor

## 2022-04-24 NOTE — Assessment & Plan Note (Signed)
The patient has difficulties losing weight She has gained weight since last time I saw her She is motivated but need resources and willpower  At this point in time, I suggest she discuss medical management with weight loss related medication through her primary care doctor I will see her again in 3 months for follow-up

## 2022-04-24 NOTE — Assessment & Plan Note (Signed)
She has completed recent treatment There is no signs of recurrent lymphoma I will see her again in 3 months for further follow-up We discussed the importance of aggressive lifestyle changes 

## 2022-04-24 NOTE — Progress Notes (Signed)
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 There is no signs of recurrent lymphoma I will see her again in 3 months for further follow-up We discussed the importance of aggressive lifestyle changes  Type 2 diabetes mellitus with complication, without long-term current use of insulin ([place]) She has severe, uncontrolled diabetes The patient is motivated to make lifestyle changes but has not experienced any good success She will continue medical management with her primary care doctor  Class 3 severe obesity due to excess calories with serious comorbidity in adult [facility]) The patient has difficulties losing weight She has gained weight since last time I saw her She is motivated but need resources and willpower  At this point in time, I suggest she discuss medical management with weight loss related medication through her primary care doctor I will see her again in 3 months for follow-up  No orders of the defined types were placed in this encounter.   All questions were answered. The patient knows to call the clinic with any problems, questions or concerns. The total time spent in the appointment was 20 minutes encounter with patients including review of chart and various tests results, discussions about plan of care and coordination of care plan   Heath Lark, MD 04/24/2022 8:56 AM  INTERVAL HISTORY: Please see below for problem oriented charting. she returns for surveillance follow-up for history of lymphoma She has no new lymphadenopathy She complains of fatigue Unfortunately, she continues to have the trajectory of weight gain She has high motivation but lack of willpower She is not exercising and was unable to cut out fruit from her  diet  REVIEW OF SYSTEMS:   Constitutional: Denies fevers, chills or abnormal weight loss Eyes: Denies blurriness of vision Ears, nose, mouth, throat, and face: Denies mucositis or sore throat Respiratory: Denies cough, dyspnea or wheezes Cardiovascular: Denies palpitation, chest discomfort or lower extremity swelling Gastrointestinal:  Denies nausea, heartburn or change in bowel habits Skin: Denies abnormal skin rashes Lymphatics: Denies new lymphadenopathy or easy bruising Neurological:Denies numbness, tingling or new weaknesses Behavioral/Psych: Mood is stable, no new changes  All other systems were reviewed with the patient and are negative.  I have reviewed the past medical history, past surgical history, social history and family history with the patient and they are unchanged from previous note.  ALLERGIES:  is allergic to erythromycin, rituxan [rituximab], lovastatin, and tolnaftate.  MEDICATIONS:  Current Outpatient Medications  Medication Sig Dispense Refill   acetaminophen (TYLENOL) 500 MG tablet Take 1 tablet (500 mg total) by mouth every 8 (eight) hours as needed (pain). 30 tablet 0   aspirin 81 MG chewable tablet Chew 1 tablet (81 mg total) by mouth daily. 30 tablet 0   Bempedoic Acid (NEXLETOL) 180 MG TABS Take 1 tablet by mouth daily.     fish oil-omega-3 fatty acids 1000 MG capsule Take 1 g by mouth 2 (two) times daily.      furosemide (LASIX) 40 MG tablet Take 40 mg by mouth as needed.     hydrOXYzine (ATARAX/VISTARIL) 25 MG tablet Take 25 mg by mouth 3 (three) times daily.     losartan (COZAAR) 25 MG tablet Take 25 mg by mouth daily.     metFORMIN (GLUCOPHAGE) 1000 MG tablet Take 1,000 mg by mouth  2 (two) times daily with a meal.       metoprolol tartrate (LOPRESSOR) 100 MG tablet Take 1 tablet (100 mg total) by mouth 2 (two) times daily. PT OVERDUE FOR OV PLEASE CALL FOR APPT 60 tablet 0   nitroGLYCERIN (NITROSTAT) 0.4 MG SL tablet Place 1 tablet (0.4 mg total) under  the tongue every 5 (five) minutes as needed for chest pain. 30 tablet 0   ondansetron (ZOFRAN) 8 MG tablet Take 1 tablet (8 mg total) by mouth every 8 (eight) hours as needed for refractory nausea / vomiting. Start on day 2 after bendamustine chemo. 30 tablet 1   prochlorperazine (COMPAZINE) 10 MG tablet Take 1 tablet (10 mg total) by mouth every 6 (six) hours as needed (Nausea or vomiting). 30 tablet 1   TRULICITY 4.5 MG/0.5ML SOPN Inject 4.5 mg into the skin once a week.     No current facility-administered medications for this visit.    SUMMARY OF ONCOLOGIC HISTORY: Oncology History  Splenic marginal zone b-cell lymphoma (HCC)  01/11/2009 Initial Diagnosis   Splenic marginal zone b-cell lymphoma   06/06/2014 Bone Marrow Biopsy   Bone marrow aspirate and biopsy confirmed extensive involvement by CD20 positive non-Hodgkin's lymphoma.   06/07/2014 Imaging   She had a PET scan which showed predominant splenic involvement.   06/16/2014 - 07/07/2014 Chemotherapy   She started on weekly rituximab.   06/16/2014 Adverse Reaction   She had infusion reaction, resolved with IV dexamethasone.   08/18/2014 Imaging   PET CT scan show resolution of hypermetabolic activity.   04/08/2016 Imaging   1. No evidence for aortic dissection or vascular injury. 2. Mild atherosclerotic changes within the abdominal aorta and branch vessels without aneurysm. 3. No significant adenopathy or evidence for recurrent lymphoma. 4. Stable mild splenomegaly. 5. 5 mm nodule in the right lower lobe demonstrates interval increase in size. No follow-up needed if patient is low-risk. Non-contrast chest CT can be considered in 12 months if patient is high-risk.  6. Degenerate changes at the SI joints bilaterally.   01/21/2018 Imaging   1. Persistent mild splenomegaly, similar to prior examinations. No other findings to suggest recurrent disease in the chest, abdomen or pelvis. 2. Aortic atherosclerosis, in addition to two vessel  coronary artery disease. Please note that although the presence of coronary artery calcium documents the presence of coronary artery disease, the severity of this disease and any potential stenosis cannot be assessed on this non-gated CT examination. Assessment for potential risk factor modification, dietary therapy or pharmacologic therapy may be warranted, if clinically indicated.  3. Enlarging heterogeneously enhancing and partially calcified left thyroid lobe nodule. Further evaluation with nonemergent thyroid ultrasound is recommended in the near future to better evaluate this lesion and determine if there is a need for fine-needle aspiration. 4. Additional incidental findings, as above.   09/28/2020 Imaging   1. Progressive splenomegaly. The splenic volume has almost doubled since 2019. 2. No adenopathy in the chest, abdomen or pelvis. 3. Stable fibroid uterus. 4. Moderate stool throughout the colon and down into the rectum suggesting constipation.   Aortic Atherosclerosis (ICD10-I70.0).   09/28/2020 Cancer Staging   Staging form: Hodgkin and Non-Hodgkin Lymphoma, AJCC 8th Edition - Clinical stage from 09/28/2020: Stage IE (Marginal zone lymphoma) - Signed by Gorsuch, Ni, MD on 09/28/2020   10/15/2020 - 04/10/2021 Chemotherapy    Patient received bendamustine x 6 cycles.    05/10/2021 Imaging   1. No lymphadenopathy or other findings of recurrent lymphoma in   the abdomen or pelvis. Spleen is normal size, decreased from prior. 2. Mild diffuse hepatic steatosis. 3. Mild sigmoid diverticulosis. 4. Enlarged myomatous uterus. 5. Coronary atherosclerosis. 6. Aortic Atherosclerosis (ICD10-I70.0).     PHYSICAL EXAMINATION: ECOG PERFORMANCE STATUS: 1 - Symptomatic but completely ambulatory  Vitals:   04/24/22 0822  BP: 123/81  Pulse: (!) 101  Resp: 18  SpO2: 98%   There were no vitals filed for this visit.  GENERAL:alert, no distress and comfortable SKIN: skin color, texture, turgor  are normal, no rashes or significant lesions EYES: normal, Conjunctiva are pink and non-injected, sclera clear OROPHARYNX:no exudate, no erythema and lips, buccal mucosa, and tongue normal  NECK: supple, thyroid normal size, non-tender, without nodularity LYMPH:  no palpable lymphadenopathy in the cervical, axillary or inguinal LUNGS: clear to auscultation and percussion with normal breathing effort HEART: regular rate & rhythm and no murmurs and no lower extremity edema ABDOMEN:abdomen soft, non-tender and normal bowel sounds Musculoskeletal:no cyanosis of digits and no clubbing  NEURO: alert & oriented x 3 with fluent speech, no focal motor/sensory deficits  LABORATORY DATA:  I have reviewed the data as listed    Component Value Date/Time   NA 138 04/24/2022 0743   NA 138 07/24/2017 1105   K 4.1 04/24/2022 0743   K 4.2 07/24/2017 1105   CL 98 04/24/2022 0743   CL 106 11/19/2012 0837   CO2 29 04/24/2022 0743   CO2 26 07/24/2017 1105   GLUCOSE 395 (H) 04/24/2022 0743   GLUCOSE 362 (H) 07/24/2017 1105   GLUCOSE 107 (H) 11/19/2012 0837   BUN 19 04/24/2022 0743   BUN 10.0 07/24/2017 1105   CREATININE 0.98 04/24/2022 0743   CREATININE 0.8 07/24/2017 1105   CALCIUM 9.9 04/24/2022 0743   CALCIUM 8.8 07/24/2017 1105   PROT 7.2 04/24/2022 0743   PROT 6.3 (L) 07/24/2017 1105   ALBUMIN 4.5 04/24/2022 0743   ALBUMIN 3.4 (L) 07/24/2017 1105   AST 22 04/24/2022 0743   AST 13 07/24/2017 1105   ALT 44 04/24/2022 0743   ALT 21 07/24/2017 1105   ALKPHOS 104 04/24/2022 0743   ALKPHOS 112 07/24/2017 1105   BILITOT 0.8 04/24/2022 0743   BILITOT 0.55 07/24/2017 1105   GFRNONAA >60 04/24/2022 0743   GFRAA >60 05/18/2020 0825   GFRAA >60 10/22/2018 0809    No results found for: "SPEP", "UPEP"  Lab Results  Component Value Date   WBC 6.5 04/24/2022   NEUTROABS 4.7 04/24/2022   HGB 15.3 (H) 04/24/2022   HCT 45.3 04/24/2022   MCV 77.8 (L) 04/24/2022   PLT 193 04/24/2022       Chemistry      Component Value Date/Time   NA 138 04/24/2022 0743   NA 138 07/24/2017 1105   K 4.1 04/24/2022 0743   K 4.2 07/24/2017 1105   CL 98 04/24/2022 0743   CL 106 11/19/2012 0837   CO2 29 04/24/2022 0743   CO2 26 07/24/2017 1105   BUN 19 04/24/2022 0743   BUN 10.0 07/24/2017 1105   CREATININE 0.98 04/24/2022 0743   CREATININE 0.8 07/24/2017 1105      Component Value Date/Time   CALCIUM 9.9 04/24/2022 0743   CALCIUM 8.8 07/24/2017 1105   ALKPHOS 104 04/24/2022 0743   ALKPHOS 112 07/24/2017 1105   AST 22 04/24/2022 0743   AST 13 07/24/2017 1105   ALT 44 04/24/2022 0743   ALT 21 07/24/2017 1105   BILITOT 0.8 04/24/2022 0743   BILITOT 0.55   07/24/2017 1105

## 2022-05-21 ENCOUNTER — Encounter (INDEPENDENT_AMBULATORY_CARE_PROVIDER_SITE_OTHER): Payer: Self-pay

## 2022-07-24 ENCOUNTER — Other Ambulatory Visit: Payer: No Typology Code available for payment source

## 2022-07-24 ENCOUNTER — Ambulatory Visit: Payer: No Typology Code available for payment source | Admitting: Hematology and Oncology

## 2022-08-08 ENCOUNTER — Inpatient Hospital Stay: Payer: No Typology Code available for payment source | Attending: Hematology and Oncology

## 2022-08-08 ENCOUNTER — Other Ambulatory Visit: Payer: Self-pay

## 2022-08-08 ENCOUNTER — Inpatient Hospital Stay: Payer: No Typology Code available for payment source

## 2022-08-08 ENCOUNTER — Encounter: Payer: Self-pay | Admitting: Hematology and Oncology

## 2022-08-08 ENCOUNTER — Inpatient Hospital Stay (HOSPITAL_BASED_OUTPATIENT_CLINIC_OR_DEPARTMENT_OTHER): Payer: No Typology Code available for payment source | Admitting: Hematology and Oncology

## 2022-08-08 VITALS — BP 114/75 | HR 97 | Temp 98.1°F | Resp 18 | Ht 67.0 in | Wt 249.8 lb

## 2022-08-08 DIAGNOSIS — Z23 Encounter for immunization: Secondary | ICD-10-CM | POA: Insufficient documentation

## 2022-08-08 DIAGNOSIS — C8307 Small cell B-cell lymphoma, spleen: Secondary | ICD-10-CM

## 2022-08-08 DIAGNOSIS — Z7189 Other specified counseling: Secondary | ICD-10-CM

## 2022-08-08 DIAGNOSIS — Z6841 Body Mass Index (BMI) 40.0 and over, adult: Secondary | ICD-10-CM | POA: Diagnosis not present

## 2022-08-08 LAB — CBC WITH DIFFERENTIAL (CANCER CENTER ONLY)
Abs Immature Granulocytes: 0.02 10*3/uL (ref 0.00–0.07)
Basophils Absolute: 0.1 10*3/uL (ref 0.0–0.1)
Basophils Relative: 1 %
Eosinophils Absolute: 0.2 10*3/uL (ref 0.0–0.5)
Eosinophils Relative: 3 %
HCT: 45.3 % (ref 36.0–46.0)
Hemoglobin: 15.2 g/dL — ABNORMAL HIGH (ref 12.0–15.0)
Immature Granulocytes: 0 %
Lymphocytes Relative: 14 %
Lymphs Abs: 0.9 10*3/uL (ref 0.7–4.0)
MCH: 26.9 pg (ref 26.0–34.0)
MCHC: 33.6 g/dL (ref 30.0–36.0)
MCV: 80 fL (ref 80.0–100.0)
Monocytes Absolute: 0.5 10*3/uL (ref 0.1–1.0)
Monocytes Relative: 8 %
Neutro Abs: 4.7 10*3/uL (ref 1.7–7.7)
Neutrophils Relative %: 74 %
Platelet Count: 188 10*3/uL (ref 150–400)
RBC: 5.66 MIL/uL — ABNORMAL HIGH (ref 3.87–5.11)
RDW: 14.3 % (ref 11.5–15.5)
WBC Count: 6.3 10*3/uL (ref 4.0–10.5)
nRBC: 0 % (ref 0.0–0.2)

## 2022-08-08 LAB — CMP (CANCER CENTER ONLY)
ALT: 13 U/L (ref 0–44)
AST: 12 U/L — ABNORMAL LOW (ref 15–41)
Albumin: 4.3 g/dL (ref 3.5–5.0)
Alkaline Phosphatase: 84 U/L (ref 38–126)
Anion gap: 10 (ref 5–15)
BUN: 12 mg/dL (ref 6–20)
CO2: 28 mmol/L (ref 22–32)
Calcium: 9.2 mg/dL (ref 8.9–10.3)
Chloride: 105 mmol/L (ref 98–111)
Creatinine: 0.82 mg/dL (ref 0.44–1.00)
GFR, Estimated: >60 mL/min (ref >60)
Glucose, Bld: 164 mg/dL — ABNORMAL HIGH (ref 70–99)
Potassium: 3.4 mmol/L — ABNORMAL LOW (ref 3.5–5.1)
Sodium: 143 mmol/L (ref 135–145)
Total Bilirubin: 0.6 mg/dL (ref 0.3–1.2)
Total Protein: 7 g/dL (ref 6.5–8.1)

## 2022-08-08 MED ORDER — INFLUENZA VAC SPLIT QUAD 0.5 ML IM SUSY
0.5000 mL | PREFILLED_SYRINGE | Freq: Once | INTRAMUSCULAR | Status: AC
  Administered 2022-08-08: 0.5 mL via INTRAMUSCULAR
  Filled 2022-08-08: qty 0.5

## 2022-08-08 NOTE — Patient Instructions (Signed)

## 2022-08-08 NOTE — Assessment & Plan Note (Signed)
She has completed recent treatment There is no signs of recurrent lymphoma I will see her again in 3 months for further follow-up We discussed the importance of aggressive lifestyle changes

## 2022-08-08 NOTE — Assessment & Plan Note (Signed)
She has successfully lost approximately 11 pounds since her last visit through aggressive lifestyle changes and dietary modification I encouraged the patient to continue

## 2022-08-08 NOTE — Progress Notes (Signed)
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   All questions were answered. The patient knows to call the clinic with any problems, questions or concerns. The total time spent in the appointment was 20 minutes encounter with patients including review of chart and various tests results, discussions about plan of care and coordination of care plan   Heath Lark, MD 08/08/2022 11:00 AM  INTERVAL HISTORY: Please see below for problem oriented charting. she returns for surveillance follow-up for history of recurrent lymphoma Since last time I saw her, she have lost approximately 11 pounds She is very vigilant about changes in her diet She started to exercise on a regular basis Her blood sugar has improved  REVIEW OF SYSTEMS:   Constitutional: Denies fevers, chills or abnormal weight loss Eyes: Denies blurriness of vision Ears, nose, mouth, throat, and face: Denies mucositis or sore throat Respiratory: Denies cough, dyspnea or wheezes Cardiovascular: Denies palpitation, chest discomfort or lower extremity swelling Gastrointestinal:  Denies nausea, heartburn or change in bowel habits Skin: Denies abnormal skin rashes Lymphatics: Denies new lymphadenopathy or easy  bruising Neurological:Denies numbness, tingling or new weaknesses Behavioral/Psych: Mood is stable, no new changes  All other systems were reviewed with the patient and are negative.  I have reviewed the past medical history, past surgical history, social history and family history with the patient and they are unchanged from previous note.  ALLERGIES:  is allergic to erythromycin, rituxan [rituximab], lovastatin, and tolnaftate.  MEDICATIONS:  Current Outpatient Medications  Medication Sig Dispense Refill   acetaminophen (TYLENOL) 500 MG tablet Take 1 tablet (500 mg total) by mouth every 8 (eight) hours as needed (pain). 30 tablet 0   aspirin 81 MG chewable tablet Chew 1 tablet (81 mg total) by mouth daily. 30 tablet 0   Bempedoic Acid (NEXLETOL) 180 MG TABS Take 1 tablet by mouth daily.     fish oil-omega-3 fatty acids 1000 MG capsule Take 1 g by mouth 2 (two) times daily.      furosemide (LASIX) 40 MG tablet Take 40 mg by mouth as needed.     hydrOXYzine (ATARAX/VISTARIL) 25 MG tablet Take 25 mg by mouth 3 (three) times daily.     losartan (COZAAR) 25 MG tablet Take 25 mg by mouth daily.     metFORMIN (GLUCOPHAGE) 1000 MG tablet Take 1,000 mg by mouth 2 (two) times daily with a meal.       metoprolol tartrate (LOPRESSOR) 100 MG tablet Take 1 tablet (100 mg total) by mouth 2 (two) times daily. PT OVERDUE FOR OV PLEASE CALL FOR APPT 60 tablet 0   nitroGLYCERIN (NITROSTAT) 0.4 MG SL tablet Place 1 tablet (0.4 mg total) under the tongue every 5 (five) minutes as needed for chest pain. 30 tablet 0   ondansetron (ZOFRAN) 8 MG tablet Take 1 tablet (8  mg total) by mouth every 8 (eight) hours as needed for refractory nausea / vomiting. Start on day 2 after bendamustine chemo. 30 tablet 1   prochlorperazine (COMPAZINE) 10 MG tablet Take 1 tablet (10 mg total) by mouth every 6 (six) hours as needed (Nausea or vomiting). 30 tablet 1   TRULICITY 4.5 AS/5.0NL SOPN Inject 4.5 mg into the skin once a  week.     No current facility-administered medications for this visit.    SUMMARY OF ONCOLOGIC HISTORY: Oncology History  Splenic marginal zone b-cell lymphoma ([place])  01/11/2009 Initial Diagnosis   Splenic marginal zone b-cell lymphoma   06/06/2014 Bone Marrow Biopsy   Bone marrow aspirate and biopsy confirmed extensive involvement by CD20 positive non-Hodgkin's lymphoma.   06/07/2014 Imaging   She had a PET scan which showed predominant splenic involvement.   06/16/2014 - 07/07/2014 Chemotherapy   She started on weekly rituximab.   06/16/2014 Adverse Reaction   She had infusion reaction, resolved with IV dexamethasone.   08/18/2014 Imaging   PET CT scan show resolution of hypermetabolic activity.   04/08/2016 Imaging   1. No evidence for aortic dissection or vascular injury. 2. Mild atherosclerotic changes within the abdominal aorta and branch vessels without aneurysm. 3. No significant adenopathy or evidence for recurrent lymphoma. 4. Stable mild splenomegaly. 5. 5 mm nodule in the right lower lobe demonstrates interval increase in size. No follow-up needed if patient is low-risk. Non-contrast chest CT can be considered in 12 months if patient is high-risk.  6. Degenerate changes at the SI joints bilaterally.   01/21/2018 Imaging   1. Persistent mild splenomegaly, similar to prior examinations. No other findings to suggest recurrent disease in the chest, abdomen or pelvis. 2. Aortic atherosclerosis, in addition to two vessel coronary artery disease. Please note that although the presence of coronary artery calcium documents the presence of coronary artery disease, the severity of this disease and any potential stenosis cannot be assessed on this non-gated CT examination. Assessment for potential risk factor modification, dietary therapy or pharmacologic therapy may be warranted, if clinically indicated.  3. Enlarging heterogeneously enhancing and partially calcified left thyroid lobe  nodule. Further evaluation with nonemergent thyroid ultrasound is recommended in the near future to better evaluate this lesion and determine if there is a need for fine-needle aspiration. 4. Additional incidental findings, as above.   09/28/2020 Imaging   1. Progressive splenomegaly. The splenic volume has almost doubled since 2019. 2. No adenopathy in the chest, abdomen or pelvis. 3. Stable fibroid uterus. 4. Moderate stool throughout the colon and down into the rectum suggesting constipation.   Aortic Atherosclerosis (ICD10-I70.0).   09/28/2020 Cancer Staging   Staging form: Hodgkin and Non-Hodgkin Lymphoma, AJCC 8th Edition - Clinical stage from 09/28/2020: Stage IE (Marginal zone lymphoma) - Signed by Heath Lark, MD on 09/28/2020   10/15/2020 - 04/10/2021 Chemotherapy    Patient received bendamustine x 6 cycles.    05/10/2021 Imaging   1. No lymphadenopathy or other findings of recurrent lymphoma in the abdomen or pelvis. Spleen is normal size, decreased from prior. 2. Mild diffuse hepatic steatosis. 3. Mild sigmoid diverticulosis. 4. Enlarged myomatous uterus. 5. Coronary atherosclerosis. 6. Aortic Atherosclerosis (ICD10-I70.0).     PHYSICAL EXAMINATION: ECOG PERFORMANCE STATUS: 0 - Asymptomatic  Vitals:   08/08/22 0818  BP: 114/75  Pulse: 97  Resp: 18  Temp: 98.1 F (36.7 C)  SpO2: 100%   Filed Weights   08/08/22 0818  Weight: 249 lb 12.8 oz (113.3  kg)    GENERAL:alert, no distress and comfortable SKIN: skin color, texture, turgor are normal, no rashes or significant lesions EYES: normal, Conjunctiva are pink and non-injected, sclera clear OROPHARYNX:no exudate, no erythema and lips, buccal mucosa, and tongue normal  NECK: supple, thyroid normal size, non-tender, without nodularity LYMPH:  no palpable lymphadenopathy in the cervical, axillary or inguinal LUNGS: clear to auscultation and percussion with normal breathing effort HEART: regular rate & rhythm and no  murmurs and no lower extremity edema ABDOMEN:abdomen soft, non-tender and normal bowel sounds Musculoskeletal:no cyanosis of digits and no clubbing  NEURO: alert & oriented x 3 with fluent speech, no focal motor/sensory deficits  LABORATORY DATA:  I have reviewed the data as listed    Component Value Date/Time   NA 143 08/08/2022 0801   NA 138 07/24/2017 1105   K 3.4 (L) 08/08/2022 0801   K 4.2 07/24/2017 1105   CL 105 08/08/2022 0801   CL 106 11/19/2012 0837   CO2 28 08/08/2022 0801   CO2 26 07/24/2017 1105   GLUCOSE 164 (H) 08/08/2022 0801   GLUCOSE 362 (H) 07/24/2017 1105   GLUCOSE 107 (H) 11/19/2012 0837   BUN 12 08/08/2022 0801   BUN 10.0 07/24/2017 1105   CREATININE 0.82 08/08/2022 0801   CREATININE 0.8 07/24/2017 1105   CALCIUM 9.2 08/08/2022 0801   CALCIUM 8.8 07/24/2017 1105   PROT 7.0 08/08/2022 0801   PROT 6.3 (L) 07/24/2017 1105   ALBUMIN 4.3 08/08/2022 0801   ALBUMIN 3.4 (L) 07/24/2017 1105   AST 12 (L) 08/08/2022 0801   AST 13 07/24/2017 1105   ALT 13 08/08/2022 0801   ALT 21 07/24/2017 1105   ALKPHOS 84 08/08/2022 0801   ALKPHOS 112 07/24/2017 1105   BILITOT 0.6 08/08/2022 0801   BILITOT 0.55 07/24/2017 1105   GFRNONAA >60 08/08/2022 0801   GFRAA >60 05/18/2020 0825   GFRAA >60 10/22/2018 0809    No results found for: "SPEP", "UPEP"  Lab Results  Component Value Date   WBC 6.3 08/08/2022   NEUTROABS 4.7 08/08/2022   HGB 15.2 (H) 08/08/2022   HCT 45.3 08/08/2022   MCV 80.0 08/08/2022   PLT 188 08/08/2022      Chemistry      Component Value Date/Time   NA 143 08/08/2022 0801   NA 138 07/24/2017 1105   K 3.4 (L) 08/08/2022 0801   K 4.2 07/24/2017 1105   CL 105 08/08/2022 0801   CL 106 11/19/2012 0837   CO2 28 08/08/2022 0801   CO2 26 07/24/2017 1105   BUN 12 08/08/2022 0801   BUN 10.0 07/24/2017 1105   CREATININE 0.82 08/08/2022 0801   CREATININE 0.8 07/24/2017 1105      Component Value Date/Time   CALCIUM 9.2 08/08/2022 0801    CALCIUM 8.8 07/24/2017 1105   ALKPHOS 84 08/08/2022 0801   ALKPHOS 112 07/24/2017 1105   AST 12 (L) 08/08/2022 0801   AST 13 07/24/2017 1105   ALT 13 08/08/2022 0801   ALT 21 07/24/2017 1105   BILITOT 0.6 08/08/2022 0801   BILITOT 0.55 07/24/2017 1105

## 2022-09-28 IMAGING — MG MM DIGITAL SCREENING BILAT W/ TOMO AND CAD
8 of 16 series · 8 of 40 positions shown · non-contrast
Comparison: Previous exam(s).

CLINICAL DATA: Screening.

EXAM:
DIGITAL SCREENING BILATERAL MAMMOGRAM WITH TOMOSYNTHESIS AND CAD
TECHNIQUE: Bilateral screening digital craniocaudal and mediolateral oblique
mammograms were obtained. Bilateral screening digital breast
tomosynthesis was performed. The images were evaluated with
computer-aided detection.

[L CC synth-2D]
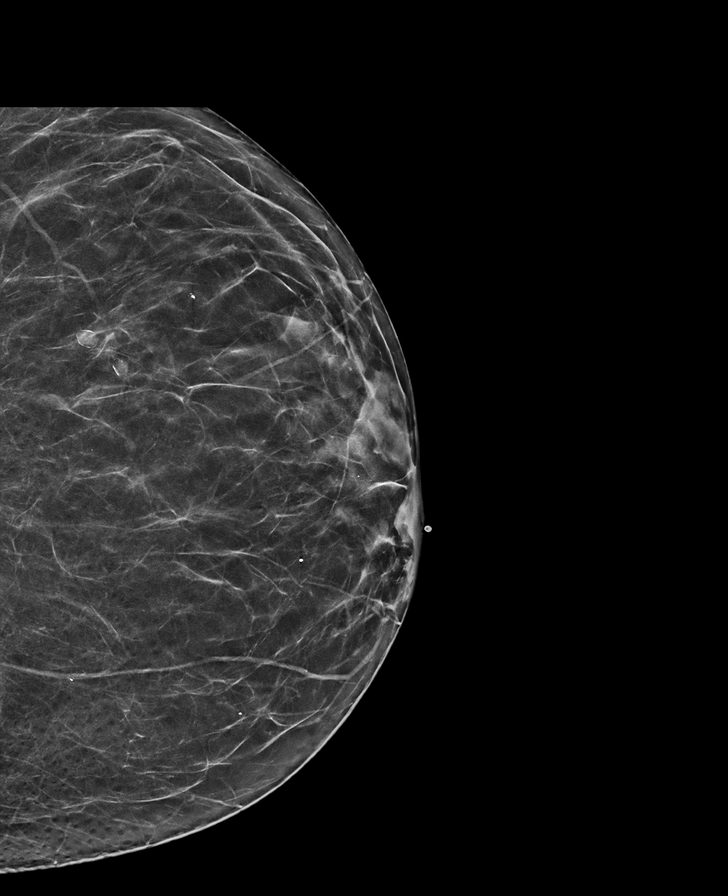

[L XCCL synth-2D]
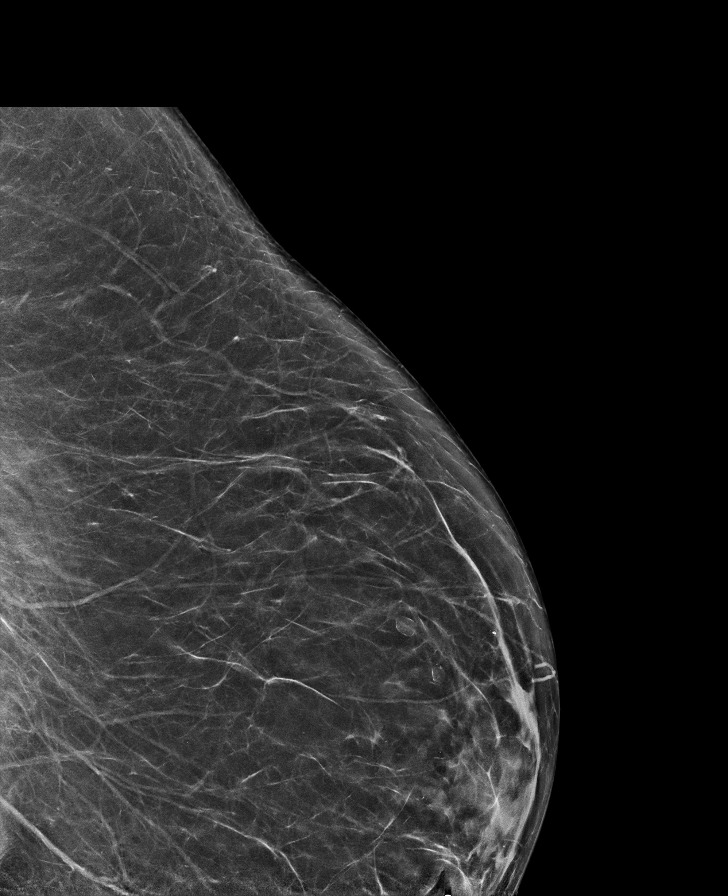

[R CC synth-2D (1 of 2)]
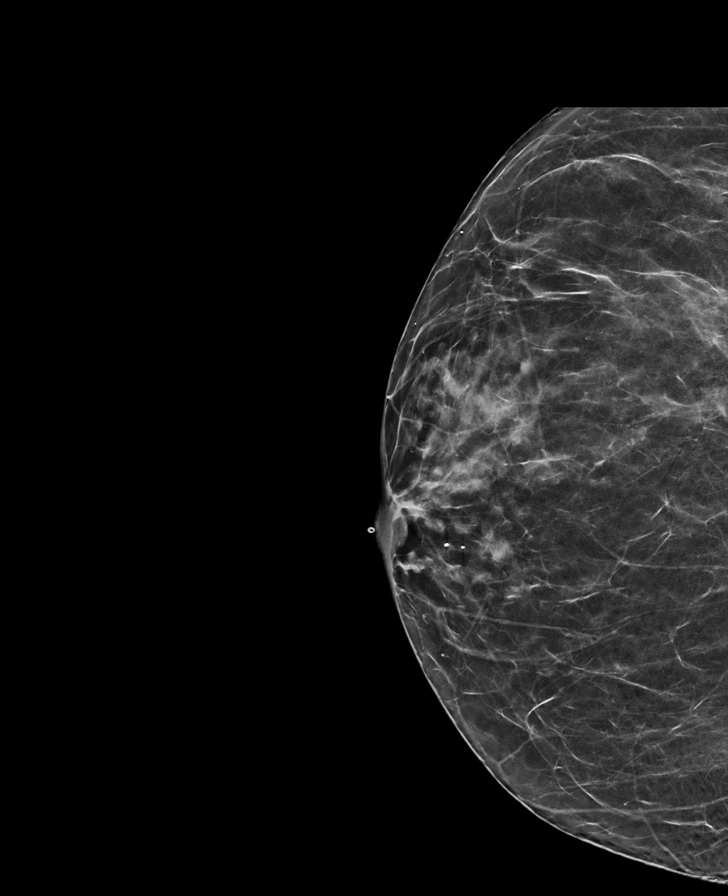

[R MLO synth-2D (1 of 2)]
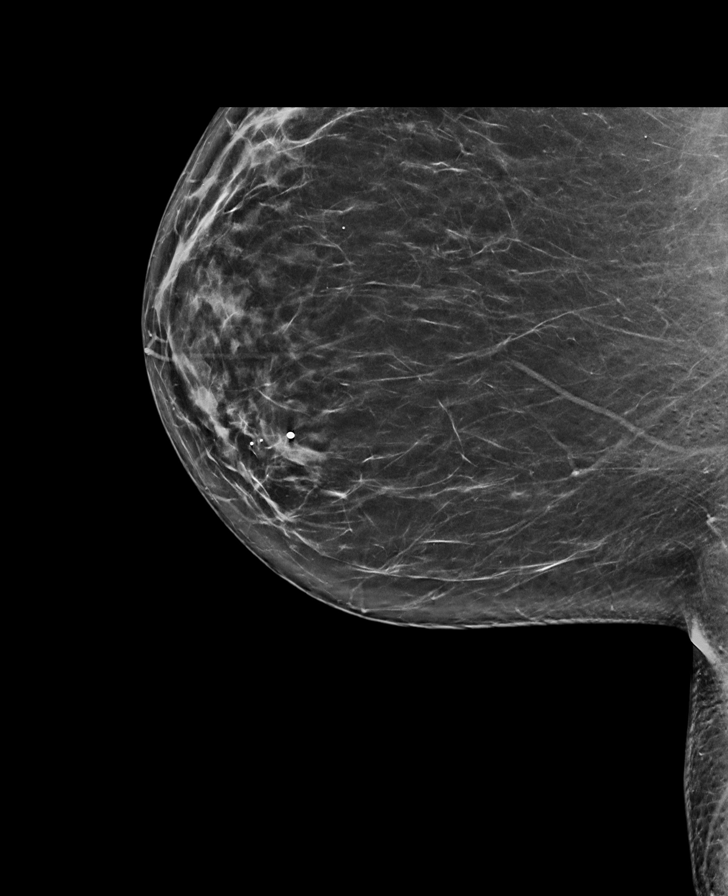

[R MLO synth-2D (2 of 2)]
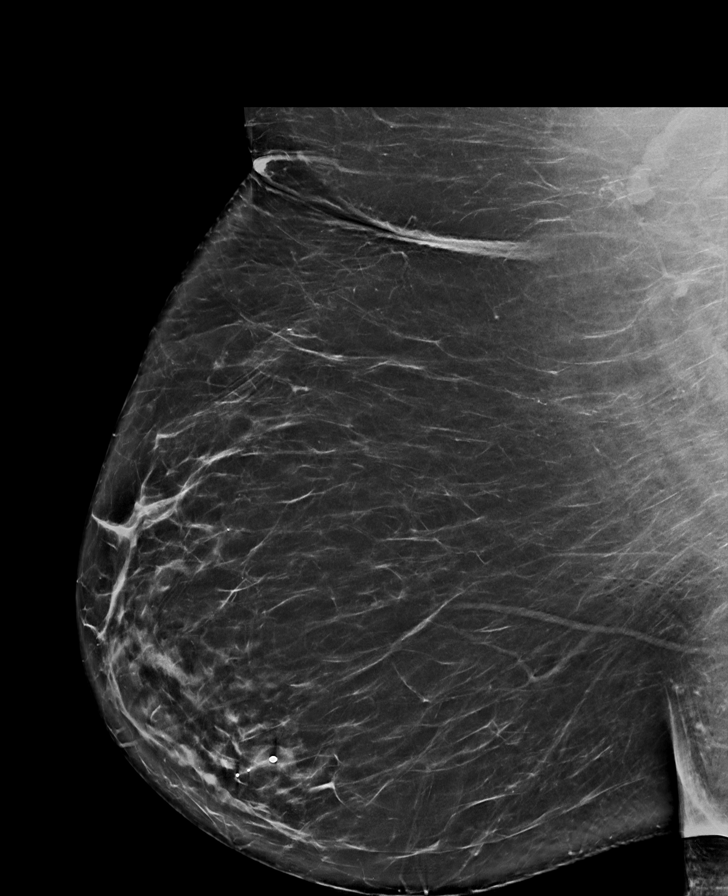

[R CC synth-2D (2 of 2)]
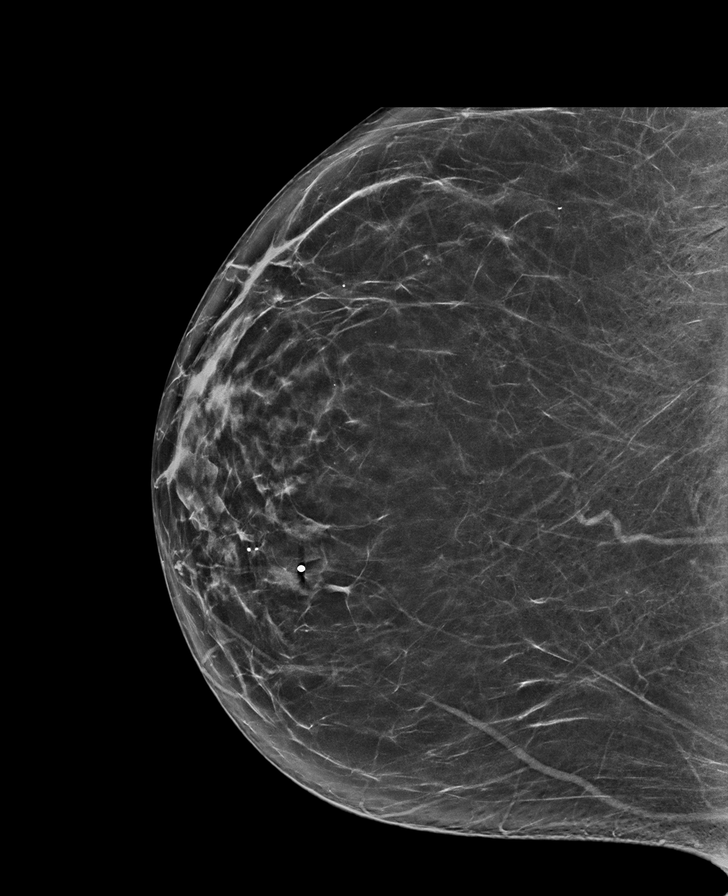

[R XCCL synth-2D]
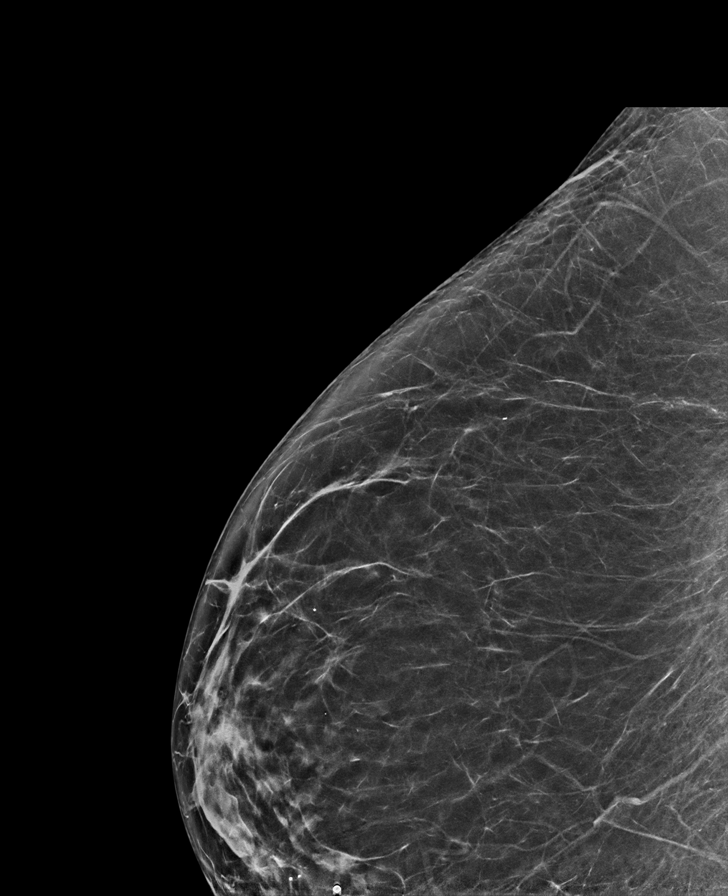

[L CC tomo · tomo slice 39/77.0]
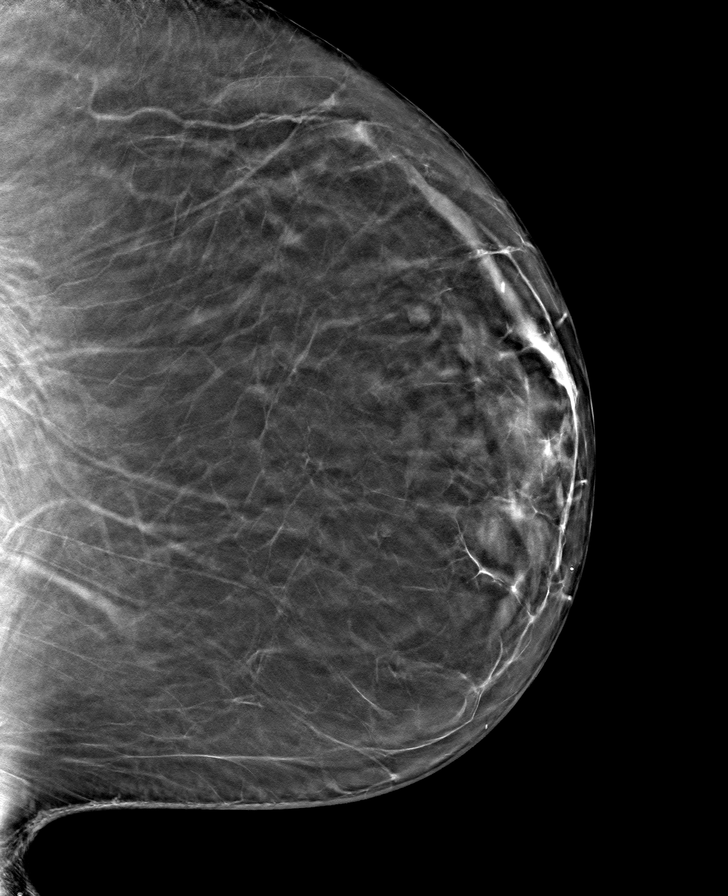

[8 of 40 positions shown; findings below may reference images not displayed]

ACR Breast Density Category b: There are scattered areas of
fibroglandular density.
FINDINGS: There are no findings suspicious for malignancy.
IMPRESSION: No mammographic evidence of malignancy. A result letter of this
screening mammogram will be mailed directly to the patient.

RECOMMENDATION:
Screening mammogram in one year. (Code:51-O-LD2)

BI-RADS CATEGORY  1: Negative.

## 2022-11-06 ENCOUNTER — Other Ambulatory Visit: Payer: Self-pay | Admitting: Hematology and Oncology

## 2022-11-06 DIAGNOSIS — C8307 Small cell B-cell lymphoma, spleen: Secondary | ICD-10-CM

## 2022-11-07 ENCOUNTER — Inpatient Hospital Stay: Payer: No Typology Code available for payment source | Admitting: Hematology and Oncology

## 2022-11-07 ENCOUNTER — Other Ambulatory Visit: Payer: Self-pay

## 2022-11-07 ENCOUNTER — Inpatient Hospital Stay: Payer: No Typology Code available for payment source | Attending: Hematology and Oncology

## 2022-11-07 VITALS — BP 126/72 | HR 103 | Temp 97.4°F | Resp 18 | Ht 67.0 in | Wt 247.8 lb

## 2022-11-07 DIAGNOSIS — Z6841 Body Mass Index (BMI) 40.0 and over, adult: Secondary | ICD-10-CM | POA: Diagnosis not present

## 2022-11-07 DIAGNOSIS — C8307 Small cell B-cell lymphoma, spleen: Secondary | ICD-10-CM

## 2022-11-07 DIAGNOSIS — Z9221 Personal history of antineoplastic chemotherapy: Secondary | ICD-10-CM | POA: Diagnosis not present

## 2022-11-07 DIAGNOSIS — C8300 Small cell B-cell lymphoma, unspecified site: Secondary | ICD-10-CM | POA: Diagnosis not present

## 2022-11-07 LAB — COMPREHENSIVE METABOLIC PANEL
ALT: 14 U/L (ref 0–44)
AST: 15 U/L (ref 15–41)
Albumin: 3.8 g/dL (ref 3.5–5.0)
Alkaline Phosphatase: 77 U/L (ref 38–126)
Anion gap: 9 (ref 5–15)
BUN: 8 mg/dL (ref 6–20)
CO2: 24 mmol/L (ref 22–32)
Calcium: 9 mg/dL (ref 8.9–10.3)
Chloride: 107 mmol/L (ref 98–111)
Creatinine, Ser: 0.67 mg/dL (ref 0.44–1.00)
GFR, Estimated: >60 mL/min (ref >60)
Glucose, Bld: 165 mg/dL — ABNORMAL HIGH (ref 70–99)
Potassium: 4.2 mmol/L (ref 3.5–5.1)
Sodium: 140 mmol/L (ref 135–145)
Total Bilirubin: 0.7 mg/dL (ref 0.3–1.2)
Total Protein: 6.4 g/dL — ABNORMAL LOW (ref 6.5–8.1)

## 2022-11-07 LAB — CBC WITH DIFFERENTIAL/PLATELET
Abs Immature Granulocytes: 0.01 10*3/uL (ref 0.00–0.07)
Basophils Absolute: 0 10*3/uL (ref 0.0–0.1)
Basophils Relative: 1 %
Eosinophils Absolute: 0.1 10*3/uL (ref 0.0–0.5)
Eosinophils Relative: 2 %
HCT: 40.8 % (ref 36.0–46.0)
Hemoglobin: 13.7 g/dL (ref 12.0–15.0)
Immature Granulocytes: 0 %
Lymphocytes Relative: 15 %
Lymphs Abs: 0.8 10*3/uL (ref 0.7–4.0)
MCH: 26.3 pg (ref 26.0–34.0)
MCHC: 33.6 g/dL (ref 30.0–36.0)
MCV: 78.3 fL — ABNORMAL LOW (ref 80.0–100.0)
Monocytes Absolute: 0.4 10*3/uL (ref 0.1–1.0)
Monocytes Relative: 8 %
Neutro Abs: 4.1 10*3/uL (ref 1.7–7.7)
Neutrophils Relative %: 74 %
Platelets: 150 10*3/uL (ref 150–400)
RBC: 5.21 MIL/uL — ABNORMAL HIGH (ref 3.87–5.11)
RDW: 13.9 % (ref 11.5–15.5)
WBC: 5.5 10*3/uL (ref 4.0–10.5)
nRBC: 0 % (ref 0.0–0.2)

## 2022-11-09 ENCOUNTER — Encounter: Payer: Self-pay | Admitting: Hematology and Oncology

## 2022-11-09 NOTE — Progress Notes (Signed)
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 There is no signs of recurrent lymphoma I will see her again in 3 months for further follow-up We discussed the importance of aggressive lifestyle changes  Class 3 severe obesity due to excess calories with serious comorbidity in adult [facility]) She has successfully lost some weight since her last visit Her blood sugar control has improved The patient appears motivated We discussed importance of weight loss due to association with recurrent lymphoma and also reduces the risk of cardiovascular morbidities I encouraged the patient to continue her best efforts  No orders of the defined types were placed in this encounter.   All questions were answered. The patient knows to call the clinic with any problems, questions or concerns. The total time spent in the appointment was 20 minutes encounter with patients including review of chart and various tests results, discussions about plan of care and coordination of care plan   Heath Lark, MD 11/09/2022 10:42 AM  INTERVAL HISTORY: Please see below for problem oriented charting. she returns for surveillance follow-up for history of lymphoma She is doing well She has no new lymphadenopathy or recent infection  REVIEW OF SYSTEMS:   Constitutional: Denies fevers, chills or abnormal weight loss Eyes: Denies blurriness of vision Ears, nose, mouth, throat, and face: Denies mucositis or sore throat Respiratory: Denies cough, dyspnea or wheezes Cardiovascular: Denies palpitation, chest discomfort or lower extremity swelling Gastrointestinal:  Denies nausea, heartburn or change in bowel habits Skin: Denies abnormal skin rashes Lymphatics: Denies new  lymphadenopathy or easy bruising Neurological:Denies numbness, tingling or new weaknesses Behavioral/Psych: Mood is stable, no new changes  All other systems were reviewed with the patient and are negative.  I have reviewed the past medical history, past surgical history, social history and family history with the patient and they are unchanged from previous note.  ALLERGIES:  is allergic to erythromycin, rituxan [rituximab], lovastatin, and tolnaftate.  MEDICATIONS:  Current Outpatient Medications  Medication Sig Dispense Refill   acetaminophen (TYLENOL) 500 MG tablet Take 1 tablet (500 mg total) by mouth every 8 (eight) hours as needed (pain). 30 tablet 0   aspirin 81 MG chewable tablet Chew 1 tablet (81 mg total) by mouth daily. 30 tablet 0   Bempedoic Acid (NEXLETOL) 180 MG TABS Take 1 tablet by mouth daily.     FARXIGA 5 MG TABS tablet 1 tablet Orally Once a day     fish oil-omega-3 fatty acids 1000 MG capsule Take 1 g by mouth 2 (two) times daily.      furosemide (LASIX) 40 MG tablet Take 40 mg by mouth as needed.     hydrOXYzine (ATARAX/VISTARIL) 25 MG tablet Take 25 mg by mouth 3 (three) times daily.     losartan (COZAAR) 25 MG tablet Take 25 mg by mouth daily.     metFORMIN (GLUCOPHAGE) 1000 MG tablet Take 1,000 mg by mouth 2 (two) times daily with a meal.       metoprolol tartrate (LOPRESSOR) 100 MG tablet Take 1 tablet (100 mg total) by mouth 2 (two) times daily. PT OVERDUE FOR OV PLEASE CALL FOR APPT 60 tablet 0   nitroGLYCERIN (NITROSTAT) 0.4 MG SL tablet Place 1 tablet (0.4 mg total) under the tongue every 5 (five) minutes as needed  for chest pain. 30 tablet 0   TRULICITY 4.5 SW/9.6PR SOPN Inject 4.5 mg into the skin once a week.     Vitamin D, Ergocalciferol, (DRISDOL) 1.25 MG (50000 UNIT) CAPS capsule TAKE 1 CAPSULE BY MOUTH ONE TIME PER WEEK     No current facility-administered medications for this visit.    SUMMARY OF ONCOLOGIC HISTORY: Oncology History  Splenic  marginal zone b-cell lymphoma ([place])  01/11/2009 Initial Diagnosis   Splenic marginal zone b-cell lymphoma   06/06/2014 Bone Marrow Biopsy   Bone marrow aspirate and biopsy confirmed extensive involvement by CD20 positive non-Hodgkin's lymphoma.   06/07/2014 Imaging   She had a PET scan which showed predominant splenic involvement.   06/16/2014 - 07/07/2014 Chemotherapy   She started on weekly rituximab.   06/16/2014 Adverse Reaction   She had infusion reaction, resolved with IV dexamethasone.   08/18/2014 Imaging   PET CT scan show resolution of hypermetabolic activity.   04/08/2016 Imaging   1. No evidence for aortic dissection or vascular injury. 2. Mild atherosclerotic changes within the abdominal aorta and branch vessels without aneurysm. 3. No significant adenopathy or evidence for recurrent lymphoma. 4. Stable mild splenomegaly. 5. 5 mm nodule in the right lower lobe demonstrates interval increase in size. No follow-up needed if patient is low-risk. Non-contrast chest CT can be considered in 12 months if patient is high-risk.  6. Degenerate changes at the SI joints bilaterally.   01/21/2018 Imaging   1. Persistent mild splenomegaly, similar to prior examinations. No other findings to suggest recurrent disease in the chest, abdomen or pelvis. 2. Aortic atherosclerosis, in addition to two vessel coronary artery disease. Please note that although the presence of coronary artery calcium documents the presence of coronary artery disease, the severity of this disease and any potential stenosis cannot be assessed on this non-gated CT examination. Assessment for potential risk factor modification, dietary therapy or pharmacologic therapy may be warranted, if clinically indicated.  3. Enlarging heterogeneously enhancing and partially calcified left thyroid lobe nodule. Further evaluation with nonemergent thyroid ultrasound is recommended in the near future to better evaluate this lesion and determine  if there is a need for fine-needle aspiration. 4. Additional incidental findings, as above.   09/28/2020 Imaging   1. Progressive splenomegaly. The splenic volume has almost doubled since 2019. 2. No adenopathy in the chest, abdomen or pelvis. 3. Stable fibroid uterus. 4. Moderate stool throughout the colon and down into the rectum suggesting constipation.   Aortic Atherosclerosis (ICD10-I70.0).   09/28/2020 Cancer Staging   Staging form: Hodgkin and Non-Hodgkin Lymphoma, AJCC 8th Edition - Clinical stage from 09/28/2020: Stage IE (Marginal zone lymphoma) - Signed by Heath Lark, MD on 09/28/2020   10/15/2020 - 04/10/2021 Chemotherapy    Patient received bendamustine x 6 cycles.    05/10/2021 Imaging   1. No lymphadenopathy or other findings of recurrent lymphoma in the abdomen or pelvis. Spleen is normal size, decreased from prior. 2. Mild diffuse hepatic steatosis. 3. Mild sigmoid diverticulosis. 4. Enlarged myomatous uterus. 5. Coronary atherosclerosis. 6. Aortic Atherosclerosis (ICD10-I70.0).     PHYSICAL EXAMINATION: ECOG PERFORMANCE STATUS: 0 - Asymptomatic  Vitals:   11/07/22 0803  BP: 126/72  Pulse: (!) 103  Resp: 18  Temp: (!) 97.4 F (36.3 C)  SpO2: 99%   Filed Weights   11/07/22 0803  Weight: 247 lb 12.8 oz (112.4 kg)    GENERAL:alert, no distress and comfortable SKIN: skin color, texture, turgor are normal, no rashes or significant lesions  EYES: normal, Conjunctiva are pink and non-injected, sclera clear OROPHARYNX:no exudate, no erythema and lips, buccal mucosa, and tongue normal  NECK: supple, thyroid normal size, non-tender, without nodularity LYMPH:  no palpable lymphadenopathy in the cervical, axillary or inguinal LUNGS: clear to auscultation and percussion with normal breathing effort HEART: regular rate & rhythm and no murmurs and no lower extremity edema ABDOMEN:abdomen soft, non-tender and normal bowel sounds Musculoskeletal:no cyanosis of digits  and no clubbing  NEURO: alert & oriented x 3 with fluent speech, no focal motor/sensory deficits  LABORATORY DATA:  I have reviewed the data as listed    Component Value Date/Time   NA 140 11/07/2022 0755   NA 138 07/24/2017 1105   K 4.2 11/07/2022 0755   K 4.2 07/24/2017 1105   CL 107 11/07/2022 0755   CL 106 11/19/2012 0837   CO2 24 11/07/2022 0755   CO2 26 07/24/2017 1105   GLUCOSE 165 (H) 11/07/2022 0755   GLUCOSE 362 (H) 07/24/2017 1105   GLUCOSE 107 (H) 11/19/2012 0837   BUN 8 11/07/2022 0755   BUN 10.0 07/24/2017 1105   CREATININE 0.67 11/07/2022 0755   CREATININE 0.82 08/08/2022 0801   CREATININE 0.8 07/24/2017 1105   CALCIUM 9.0 11/07/2022 0755   CALCIUM 8.8 07/24/2017 1105   PROT 6.4 (L) 11/07/2022 0755   PROT 6.3 (L) 07/24/2017 1105   ALBUMIN 3.8 11/07/2022 0755   ALBUMIN 3.4 (L) 07/24/2017 1105   AST 15 11/07/2022 0755   AST 12 (L) 08/08/2022 0801   AST 13 07/24/2017 1105   ALT 14 11/07/2022 0755   ALT 13 08/08/2022 0801   ALT 21 07/24/2017 1105   ALKPHOS 77 11/07/2022 0755   ALKPHOS 112 07/24/2017 1105   BILITOT 0.7 11/07/2022 0755   BILITOT 0.6 08/08/2022 0801   BILITOT 0.55 07/24/2017 1105   GFRNONAA >60 11/07/2022 0755   GFRNONAA >60 08/08/2022 0801   GFRAA >60 05/18/2020 0825   GFRAA >60 10/22/2018 0809    No results found for: "SPEP", "UPEP"  Lab Results  Component Value Date   WBC 5.5 11/07/2022   NEUTROABS 4.1 11/07/2022   HGB 13.7 11/07/2022   HCT 40.8 11/07/2022   MCV 78.3 (L) 11/07/2022   PLT 150 11/07/2022      Chemistry      Component Value Date/Time   NA 140 11/07/2022 0755   NA 138 07/24/2017 1105   K 4.2 11/07/2022 0755   K 4.2 07/24/2017 1105   CL 107 11/07/2022 0755   CL 106 11/19/2012 0837   CO2 24 11/07/2022 0755   CO2 26 07/24/2017 1105   BUN 8 11/07/2022 0755   BUN 10.0 07/24/2017 1105   CREATININE 0.67 11/07/2022 0755   CREATININE 0.82 08/08/2022 0801   CREATININE 0.8 07/24/2017 1105      Component Value  Date/Time   CALCIUM 9.0 11/07/2022 0755   CALCIUM 8.8 07/24/2017 1105   ALKPHOS 77 11/07/2022 0755   ALKPHOS 112 07/24/2017 1105   AST 15 11/07/2022 0755   AST 12 (L) 08/08/2022 0801   AST 13 07/24/2017 1105   ALT 14 11/07/2022 0755   ALT 13 08/08/2022 0801   ALT 21 07/24/2017 1105   BILITOT 0.7 11/07/2022 0755   BILITOT 0.6 08/08/2022 0801   BILITOT 0.55 07/24/2017 1105

## 2022-11-09 NOTE — Assessment & Plan Note (Signed)
She has successfully lost some weight since her last visit Her blood sugar control has improved The patient appears motivated We discussed importance of weight loss due to association with recurrent lymphoma and also reduces the risk of cardiovascular morbidities I encouraged the patient to continue her best efforts

## 2022-11-09 NOTE — Assessment & Plan Note (Signed)
She has completed recent treatment There is no signs of recurrent lymphoma I will see her again in 3 months for further follow-up We discussed the importance of aggressive lifestyle changes

## 2023-04-14 ENCOUNTER — Telehealth: Payer: Self-pay | Admitting: Hematology and Oncology

## 2023-04-14 NOTE — Telephone Encounter (Signed)
Left patient a vm regarding upcoming appointment change  

## 2023-05-08 ENCOUNTER — Other Ambulatory Visit: Payer: No Typology Code available for payment source

## 2023-05-08 ENCOUNTER — Ambulatory Visit: Payer: No Typology Code available for payment source | Admitting: Hematology and Oncology

## 2023-05-14 ENCOUNTER — Encounter: Payer: Self-pay | Admitting: Hematology and Oncology

## 2023-05-14 ENCOUNTER — Other Ambulatory Visit: Payer: Self-pay

## 2023-05-14 ENCOUNTER — Inpatient Hospital Stay: Payer: No Typology Code available for payment source | Attending: Hematology and Oncology

## 2023-05-14 ENCOUNTER — Inpatient Hospital Stay (HOSPITAL_BASED_OUTPATIENT_CLINIC_OR_DEPARTMENT_OTHER): Payer: No Typology Code available for payment source | Admitting: Hematology and Oncology

## 2023-05-14 VITALS — BP 127/79 | HR 102 | Temp 97.8°F | Resp 18 | Ht 67.0 in | Wt 253.8 lb

## 2023-05-14 DIAGNOSIS — Z8572 Personal history of non-Hodgkin lymphomas: Secondary | ICD-10-CM | POA: Insufficient documentation

## 2023-05-14 DIAGNOSIS — C8307 Small cell B-cell lymphoma, spleen: Secondary | ICD-10-CM | POA: Diagnosis not present

## 2023-05-14 DIAGNOSIS — E118 Type 2 diabetes mellitus with unspecified complications: Secondary | ICD-10-CM | POA: Diagnosis not present

## 2023-05-14 DIAGNOSIS — E119 Type 2 diabetes mellitus without complications: Secondary | ICD-10-CM | POA: Insufficient documentation

## 2023-05-14 LAB — CBC WITH DIFFERENTIAL/PLATELET
Abs Immature Granulocytes: 0.03 10*3/uL (ref 0.00–0.07)
Basophils Absolute: 0.1 10*3/uL (ref 0.0–0.1)
Basophils Relative: 1 %
Eosinophils Absolute: 0.3 10*3/uL (ref 0.0–0.5)
Eosinophils Relative: 3 %
HCT: 46 % (ref 36.0–46.0)
Hemoglobin: 15.8 g/dL — ABNORMAL HIGH (ref 12.0–15.0)
Immature Granulocytes: 0 %
Lymphocytes Relative: 14 %
Lymphs Abs: 1.2 10*3/uL (ref 0.7–4.0)
MCH: 26.7 pg (ref 26.0–34.0)
MCHC: 34.3 g/dL (ref 30.0–36.0)
MCV: 77.8 fL — ABNORMAL LOW (ref 80.0–100.0)
Monocytes Absolute: 0.6 10*3/uL (ref 0.1–1.0)
Monocytes Relative: 7 %
Neutro Abs: 6.6 10*3/uL (ref 1.7–7.7)
Neutrophils Relative %: 75 %
Platelets: 179 10*3/uL (ref 150–400)
RBC: 5.91 MIL/uL — ABNORMAL HIGH (ref 3.87–5.11)
RDW: 13.8 % (ref 11.5–15.5)
WBC: 8.9 10*3/uL (ref 4.0–10.5)
nRBC: 0 % (ref 0.0–0.2)

## 2023-05-14 LAB — COMPREHENSIVE METABOLIC PANEL
ALT: 19 U/L (ref 0–44)
AST: 15 U/L (ref 15–41)
Albumin: 4.5 g/dL (ref 3.5–5.0)
Alkaline Phosphatase: 106 U/L (ref 38–126)
Anion gap: 11 (ref 5–15)
BUN: 21 mg/dL — ABNORMAL HIGH (ref 6–20)
CO2: 28 mmol/L (ref 22–32)
Calcium: 9.4 mg/dL (ref 8.9–10.3)
Chloride: 99 mmol/L (ref 98–111)
Creatinine, Ser: 1.06 mg/dL — ABNORMAL HIGH (ref 0.44–1.00)
GFR, Estimated: >60 mL/min (ref >60)
Glucose, Bld: 258 mg/dL — ABNORMAL HIGH (ref 70–99)
Potassium: 3.9 mmol/L (ref 3.5–5.1)
Sodium: 138 mmol/L (ref 135–145)
Total Bilirubin: 0.5 mg/dL (ref 0.3–1.2)
Total Protein: 7.3 g/dL (ref 6.5–8.1)

## 2023-05-14 NOTE — Progress Notes (Signed)
[facility] OFFICE PROGRESS NOTE  Patient Care Team: Milus Height, [place] as PCP - General (Nurse Practitioner) Artis Delay, MD as Consulting Physician (Hematology and Oncology)  ASSESSMENT & PLAN:  Splenic marginal zone b-cell lymphoma ([facility]) There is no signs of recurrent lymphoma I will see her again in 6 months for further follow-up We discussed the importance of aggressive lifestyle changes  Type 2 diabetes mellitus with complication, without long-term current use of insulin ([facility]) She has severe, uncontrolled diabetes The patient is motivated to make lifestyle changes but has not experienced any good success She will continue medical management with her primary care doctor  No orders of the defined types were placed in this encounter.   All questions were answered. The patient knows to call the clinic with any problems, questions or concerns. The total time spent in the appointment was 20 minutes encounter with patients including review of chart and various tests results, discussions about plan of care and coordination of care plan   Artis Delay, MD 05/14/2023 9:45 AM  INTERVAL HISTORY: Please see below for problem oriented charting. she returns for surveillance follow-up for history of lymphoma She has no new lymphadenopathy Due to work schedule, she was not able to exercise and has gained some weight  REVIEW OF SYSTEMS:   Constitutional: Denies fevers, chills or abnormal weight loss Eyes: Denies blurriness of vision Ears, nose, mouth, throat, and face: Denies mucositis or sore throat Respiratory: Denies cough, dyspnea or wheezes Cardiovascular: Denies palpitation, chest discomfort  Gastrointestinal:  Denies nausea, heartburn or change in bowel habits Skin: Denies abnormal skin rashes Lymphatics: Denies new lymphadenopathy or easy bruising Neurological:Denies numbness, tingling or new weaknesses Behavioral/Psych: Mood is stable, no new changes  All other  systems were reviewed with the patient and are negative.  I have reviewed the past medical history, past surgical history, social history and family history with the patient and they are unchanged from previous note.  ALLERGIES:  is allergic to erythromycin, rituxan [rituximab], lovastatin, and tolnaftate.  MEDICATIONS:  Current Outpatient Medications  Medication Sig Dispense Refill   acetaminophen (TYLENOL) 500 MG tablet Take 1 tablet (500 mg total) by mouth every 8 (eight) hours as needed (pain). 30 tablet 0   aspirin 81 MG chewable tablet Chew 1 tablet (81 mg total) by mouth daily. 30 tablet 0   Bempedoic Acid (NEXLETOL) 180 MG TABS Take 1 tablet by mouth daily.     FARXIGA 5 MG TABS tablet 1 tablet Orally Once a day     fish oil-omega-3 fatty acids 1000 MG capsule Take 1 g by mouth 2 (two) times daily.      furosemide (LASIX) 40 MG tablet Take 40 mg by mouth as needed.     hydrOXYzine (ATARAX/VISTARIL) 25 MG tablet Take 25 mg by mouth 3 (three) times daily.     losartan (COZAAR) 25 MG tablet Take 25 mg by mouth daily.     metFORMIN (GLUCOPHAGE) 1000 MG tablet Take 1,000 mg by mouth 2 (two) times daily with a meal.       metoprolol tartrate (LOPRESSOR) 100 MG tablet Take 1 tablet (100 mg total) by mouth 2 (two) times daily. PT OVERDUE FOR OV PLEASE CALL FOR APPT 60 tablet 0   nitroGLYCERIN (NITROSTAT) 0.4 MG SL tablet Place 1 tablet (0.4 mg total) under the tongue every 5 (five) minutes as needed for chest pain. 30 tablet 0   TRULICITY 4.5 MG/0.5ML SOPN Inject 4.5 mg into the skin once a  week.     Vitamin D, Ergocalciferol, (DRISDOL) 1.25 MG (50000 UNIT) CAPS capsule TAKE 1 CAPSULE BY MOUTH ONE TIME PER WEEK     No current facility-administered medications for this visit.    SUMMARY OF ONCOLOGIC HISTORY: Oncology History  Splenic marginal zone b-cell lymphoma ([facility])  01/11/2009 Initial Diagnosis   Splenic marginal zone b-cell lymphoma   06/06/2014 Bone Marrow Biopsy   Bone marrow  aspirate and biopsy confirmed extensive involvement by CD20 positive non-Hodgkin's lymphoma.   06/07/2014 Imaging   She had a PET scan which showed predominant splenic involvement.   06/16/2014 - 07/07/2014 Chemotherapy   She started on weekly rituximab.   06/16/2014 Adverse Reaction   She had infusion reaction, resolved with IV dexamethasone.   08/18/2014 Imaging   PET CT scan show resolution of hypermetabolic activity.   04/08/2016 Imaging   1. No evidence for aortic dissection or vascular injury. 2. Mild atherosclerotic changes within the abdominal aorta and branch vessels without aneurysm. 3. No significant adenopathy or evidence for recurrent lymphoma. 4. Stable mild splenomegaly. 5. 5 mm nodule in the right lower lobe demonstrates interval increase in size. No follow-up needed if patient is low-risk. Non-contrast chest CT can be considered in 12 months if patient is high-risk.  6. Degenerate changes at the SI joints bilaterally.   01/21/2018 Imaging   1. Persistent mild splenomegaly, similar to prior examinations. No other findings to suggest recurrent disease in the chest, abdomen or pelvis. 2. Aortic atherosclerosis, in addition to two vessel coronary artery disease. Please note that although the presence of coronary artery calcium documents the presence of coronary artery disease, the severity of this disease and any potential stenosis cannot be assessed on this non-gated CT examination. Assessment for potential risk factor modification, dietary therapy or pharmacologic therapy may be warranted, if clinically indicated.  3. Enlarging heterogeneously enhancing and partially calcified left thyroid lobe nodule. Further evaluation with nonemergent thyroid ultrasound is recommended in the near future to better evaluate this lesion and determine if there is a need for fine-needle aspiration. 4. Additional incidental findings, as above.   09/28/2020 Imaging   1. Progressive splenomegaly. The  splenic volume has almost doubled since 2019. 2. No adenopathy in the chest, abdomen or pelvis. 3. Stable fibroid uterus. 4. Moderate stool throughout the colon and down into the rectum suggesting constipation.   Aortic Atherosclerosis (ICD10-I70.0).   09/28/2020 Cancer Staging   Staging form: Hodgkin and Non-Hodgkin Lymphoma, AJCC 8th Edition - Clinical stage from 09/28/2020: Stage IE (Marginal zone lymphoma) - Signed by Artis Delay, MD on 09/28/2020   10/15/2020 - 04/10/2021 Chemotherapy    Patient received bendamustine x 6 cycles.    05/10/2021 Imaging   1. No lymphadenopathy or other findings of recurrent lymphoma in the abdomen or pelvis. Spleen is normal size, decreased from prior. 2. Mild diffuse hepatic steatosis. 3. Mild sigmoid diverticulosis. 4. Enlarged myomatous uterus. 5. Coronary atherosclerosis. 6. Aortic Atherosclerosis (ICD10-I70.0).     PHYSICAL EXAMINATION: ECOG PERFORMANCE STATUS: 0 - Asymptomatic  Vitals:   05/14/23 0810  BP: 127/79  Pulse: (!) 102  Resp: 18  Temp: 97.8 F (36.6 C)  SpO2: 99%   Filed Weights   05/14/23 0810  Weight: 253 lb 12.8 oz (115.1 kg)    GENERAL:alert, no distress and comfortable SKIN: skin color, texture, turgor are normal, no rashes or significant lesions EYES: normal, Conjunctiva are pink and non-injected, sclera clear OROPHARYNX:no exudate, no erythema and lips, buccal mucosa, and tongue normal  NECK: supple, thyroid normal size, non-tender, without nodularity LYMPH:  no palpable lymphadenopathy in the cervical, axillary or inguinal LUNGS: clear to auscultation and percussion with normal breathing effort HEART: regular rate & rhythm and no murmurs and with mild bilateral lower extremity edema ABDOMEN:abdomen soft, non-tender and normal bowel sounds Musculoskeletal:no cyanosis of digits and no clubbing  NEURO: alert & oriented x 3 with fluent speech, no focal motor/sensory deficits  LABORATORY DATA:  I have reviewed  the data as listed    Component Value Date/Time   NA 138 05/14/2023 0723   NA 138 07/24/2017 1105   K 3.9 05/14/2023 0723   K 4.2 07/24/2017 1105   CL 99 05/14/2023 0723   CL 106 11/19/2012 0837   CO2 28 05/14/2023 0723   CO2 26 07/24/2017 1105   GLUCOSE 258 (H) 05/14/2023 0723   GLUCOSE 362 (H) 07/24/2017 1105   GLUCOSE 107 (H) 11/19/2012 0837   BUN 21 (H) 05/14/2023 0723   BUN 10.0 07/24/2017 1105   CREATININE 1.06 (H) 05/14/2023 0723   CREATININE 0.82 08/08/2022 0801   CREATININE 0.8 07/24/2017 1105   CALCIUM 9.4 05/14/2023 0723   CALCIUM 8.8 07/24/2017 1105   PROT 7.3 05/14/2023 0723   PROT 6.3 (L) 07/24/2017 1105   ALBUMIN 4.5 05/14/2023 0723   ALBUMIN 3.4 (L) 07/24/2017 1105   AST 15 05/14/2023 0723   AST 12 (L) 08/08/2022 0801   AST 13 07/24/2017 1105   ALT 19 05/14/2023 0723   ALT 13 08/08/2022 0801   ALT 21 07/24/2017 1105   ALKPHOS 106 05/14/2023 0723   ALKPHOS 112 07/24/2017 1105   BILITOT 0.5 05/14/2023 0723   BILITOT 0.6 08/08/2022 0801   BILITOT 0.55 07/24/2017 1105   GFRNONAA >60 05/14/2023 0723   GFRNONAA >60 08/08/2022 0801   GFRAA >60 05/18/2020 0825   GFRAA >60 10/22/2018 0809    No results found for: "SPEP", "UPEP"  Lab Results  Component Value Date   WBC 8.9 05/14/2023   NEUTROABS 6.6 05/14/2023   HGB 15.8 (H) 05/14/2023   HCT 46.0 05/14/2023   MCV 77.8 (L) 05/14/2023   PLT 179 05/14/2023      Chemistry      Component Value Date/Time   NA 138 05/14/2023 0723   NA 138 07/24/2017 1105   K 3.9 05/14/2023 0723   K 4.2 07/24/2017 1105   CL 99 05/14/2023 0723   CL 106 11/19/2012 0837   CO2 28 05/14/2023 0723   CO2 26 07/24/2017 1105   BUN 21 (H) 05/14/2023 0723   BUN 10.0 07/24/2017 1105   CREATININE 1.06 (H) 05/14/2023 0723   CREATININE 0.82 08/08/2022 0801   CREATININE 0.8 07/24/2017 1105      Component Value Date/Time   CALCIUM 9.4 05/14/2023 0723   CALCIUM 8.8 07/24/2017 1105   ALKPHOS 106 05/14/2023 0723   ALKPHOS 112  07/24/2017 1105   AST 15 05/14/2023 0723   AST 12 (L) 08/08/2022 0801   AST 13 07/24/2017 1105   ALT 19 05/14/2023 0723   ALT 13 08/08/2022 0801   ALT 21 07/24/2017 1105   BILITOT 0.5 05/14/2023 0723   BILITOT 0.6 08/08/2022 0801   BILITOT 0.55 07/24/2017 1105

## 2023-05-14 NOTE — Assessment & Plan Note (Signed)
She has severe, uncontrolled diabetes The patient is motivated to make lifestyle changes but has not experienced any good success She will continue medical management with her primary care doctor

## 2023-05-14 NOTE — Assessment & Plan Note (Signed)
There is no signs of recurrent lymphoma I will see her again in 6 months for further follow-up We discussed the importance of aggressive lifestyle changes

## 2023-07-21 ENCOUNTER — Other Ambulatory Visit: Payer: Self-pay | Admitting: Internal Medicine

## 2023-07-21 DIAGNOSIS — Z0000 Encounter for general adult medical examination without abnormal findings: Secondary | ICD-10-CM

## 2023-07-25 ENCOUNTER — Ambulatory Visit: Payer: No Typology Code available for payment source

## 2023-07-25 ENCOUNTER — Ambulatory Visit
Admission: RE | Admit: 2023-07-25 | Discharge: 2023-07-25 | Disposition: A | Discharge status: Home / Self Care | Payer: No Typology Code available for payment source | Source: Ambulatory Visit | Attending: Internal Medicine

## 2023-07-25 DIAGNOSIS — Z0000 Encounter for general adult medical examination without abnormal findings: Secondary | ICD-10-CM

## 2023-11-17 ENCOUNTER — Encounter: Payer: Self-pay | Admitting: Hematology and Oncology

## 2023-11-17 ENCOUNTER — Inpatient Hospital Stay: Payer: No Typology Code available for payment source | Attending: Hematology and Oncology

## 2023-11-17 ENCOUNTER — Inpatient Hospital Stay: Payer: No Typology Code available for payment source | Admitting: Hematology and Oncology

## 2023-11-17 VITALS — BP 126/79 | HR 97 | Temp 98.1°F | Resp 18 | Ht 67.0 in | Wt 246.0 lb

## 2023-11-17 DIAGNOSIS — Z8572 Personal history of non-Hodgkin lymphomas: Secondary | ICD-10-CM | POA: Insufficient documentation

## 2023-11-17 DIAGNOSIS — Z9221 Personal history of antineoplastic chemotherapy: Secondary | ICD-10-CM | POA: Diagnosis not present

## 2023-11-17 DIAGNOSIS — Z7984 Long term (current) use of oral hypoglycemic drugs: Secondary | ICD-10-CM | POA: Diagnosis not present

## 2023-11-17 DIAGNOSIS — C8307 Small cell B-cell lymphoma, spleen: Secondary | ICD-10-CM

## 2023-11-17 DIAGNOSIS — E118 Type 2 diabetes mellitus with unspecified complications: Secondary | ICD-10-CM

## 2023-11-17 DIAGNOSIS — Z7985 Long-term (current) use of injectable non-insulin antidiabetic drugs: Secondary | ICD-10-CM | POA: Diagnosis not present

## 2023-11-17 LAB — COMPREHENSIVE METABOLIC PANEL
ALT: 15 U/L (ref 0–44)
AST: 16 U/L (ref 15–41)
Albumin: 4.6 g/dL (ref 3.5–5.0)
Alkaline Phosphatase: 66 U/L (ref 38–126)
Anion gap: 10 (ref 5–15)
BUN: 18 mg/dL (ref 6–20)
CO2: 30 mmol/L (ref 22–32)
Calcium: 9.8 mg/dL (ref 8.9–10.3)
Chloride: 100 mmol/L (ref 98–111)
Creatinine, Ser: 0.88 mg/dL (ref 0.44–1.00)
GFR, Estimated: >60 mL/min (ref >60)
Glucose, Bld: 141 mg/dL — ABNORMAL HIGH (ref 70–99)
Potassium: 4.7 mmol/L (ref 3.5–5.1)
Sodium: 140 mmol/L (ref 135–145)
Total Bilirubin: 0.7 mg/dL (ref 0.0–1.2)
Total Protein: 7.1 g/dL (ref 6.5–8.1)

## 2023-11-17 LAB — CBC WITH DIFFERENTIAL/PLATELET
Abs Immature Granulocytes: 0.02 10*3/uL (ref 0.00–0.07)
Basophils Absolute: 0.1 10*3/uL (ref 0.0–0.1)
Basophils Relative: 1 %
Eosinophils Absolute: 0.2 10*3/uL (ref 0.0–0.5)
Eosinophils Relative: 3 %
HCT: 45.7 % (ref 36.0–46.0)
Hemoglobin: 15.1 g/dL — ABNORMAL HIGH (ref 12.0–15.0)
Immature Granulocytes: 0 %
Lymphocytes Relative: 16 %
Lymphs Abs: 0.9 10*3/uL (ref 0.7–4.0)
MCH: 26.2 pg (ref 26.0–34.0)
MCHC: 33 g/dL (ref 30.0–36.0)
MCV: 79.3 fL — ABNORMAL LOW (ref 80.0–100.0)
Monocytes Absolute: 0.5 10*3/uL (ref 0.1–1.0)
Monocytes Relative: 8 %
Neutro Abs: 4.1 10*3/uL (ref 1.7–7.7)
Neutrophils Relative %: 72 %
Platelets: 196 10*3/uL (ref 150–400)
RBC: 5.76 MIL/uL — ABNORMAL HIGH (ref 3.87–5.11)
RDW: 14.4 % (ref 11.5–15.5)
WBC: 5.7 10*3/uL (ref 4.0–10.5)
nRBC: 0 % (ref 0.0–0.2)

## 2023-11-17 NOTE — Assessment & Plan Note (Signed)
She had recent weight loss and amazing diabetes control with intermittent fasting, dietary modification and regular exercise I encouraged the patient to continue her best effort

## 2023-11-17 NOTE — Progress Notes (Signed)
 [facility] OFFICE PROGRESS NOTE  Patient Care Team: Alvera Reagin, [place] as PCP - General (Nurse Practitioner) Lonn Hicks, MD as Consulting Physician (Hematology and Oncology)  ASSESSMENT & PLAN:  Splenic marginal zone b-cell lymphoma (HCC) There is no signs of recurrent lymphoma I will see her again in 6 months for further follow-up We discussed the importance of aggressive lifestyle changes  Type 2 diabetes mellitus with complication, without long-term current use of insulin  (HCC) She had recent weight loss and amazing diabetes control with intermittent fasting, dietary modification and regular exercise I encouraged the patient to continue her best effort  No orders of the defined types were placed in this encounter.   All questions were answered. The patient knows to call the clinic with any problems, questions or concerns. The total time spent in the appointment was 20 minutes encounter with patients including review of chart and various tests results, discussions about plan of care and coordination of care plan   Hicks Lonn, MD 11/17/2023 9:55 AM  INTERVAL HISTORY: Please see below for problem oriented charting. she returns for surveillance follow-up for history of lymphoma She is doing well No recent infection No new lymphadenopathy She has intentional weight loss with aggressive dietary modification and lifestyle changes  REVIEW OF SYSTEMS:   Constitutional: Denies fevers, chills or abnormal weight loss Eyes: Denies blurriness of vision Ears, nose, mouth, throat, and face: Denies mucositis or sore throat Respiratory: Denies cough, dyspnea or wheezes Cardiovascular: Denies palpitation, chest discomfort or lower extremity swelling Gastrointestinal:  Denies nausea, heartburn or change in bowel habits Skin: Denies abnormal skin rashes Lymphatics: Denies new lymphadenopathy or easy bruising Neurological:Denies numbness, tingling or new  weaknesses Behavioral/Psych: Mood is stable, no new changes  All other systems were reviewed with the patient and are negative.  I have reviewed the past medical history, past surgical history, social history and family history with the patient and they are unchanged from previous note.  ALLERGIES:  is allergic to erythromycin, rituxan  [rituximab ], lovastatin, and tolnaftate.  MEDICATIONS:  Current Outpatient Medications  Medication Sig Dispense Refill   acetaminophen  (TYLENOL ) 500 MG tablet Take 1 tablet (500 mg total) by mouth every 8 (eight) hours as needed (pain). 30 tablet 0   aspirin  81 MG chewable tablet Chew 1 tablet (81 mg total) by mouth daily. 30 tablet 0   Bempedoic Acid (NEXLETOL) 180 MG TABS Take 1 tablet by mouth daily.     FARXIGA 5 MG TABS tablet 1 tablet Orally Once a day     fish oil-omega-3 fatty acids  1000 MG capsule Take 1 g by mouth 2 (two) times daily.      furosemide  (LASIX ) 40 MG tablet Take 40 mg by mouth as needed.     losartan  (COZAAR ) 25 MG tablet Take 25 mg by mouth daily.     metFORMIN (GLUCOPHAGE) 1000 MG tablet Take 1,000 mg by mouth 2 (two) times daily with a meal.       metoprolol  tartrate (LOPRESSOR ) 100 MG tablet Take 1 tablet (100 mg total) by mouth 2 (two) times daily. PT OVERDUE FOR OV PLEASE CALL FOR APPT 60 tablet 0   nitroGLYCERIN  (NITROSTAT ) 0.4 MG SL tablet Place 1 tablet (0.4 mg total) under the tongue every 5 (five) minutes as needed for chest pain. 30 tablet 0   TRULICITY 4.5 MG/0.5ML SOPN Inject 4.5 mg into the skin once a week.     Vitamin D , Ergocalciferol , (DRISDOL) 1.25 MG (50000 UNIT) CAPS capsule TAKE 1  CAPSULE BY MOUTH ONE TIME PER WEEK     No current facility-administered medications for this visit.    SUMMARY OF ONCOLOGIC HISTORY: Oncology History  Splenic marginal zone b-cell lymphoma (HCC)  01/11/2009 Initial Diagnosis   Splenic marginal zone b-cell lymphoma   06/06/2014 Bone Marrow Biopsy   Bone marrow aspirate and biopsy  confirmed extensive involvement by CD20 positive non-Hodgkin's lymphoma.   06/07/2014 Imaging   She had a PET scan which showed predominant splenic involvement.   06/16/2014 - 07/07/2014 Chemotherapy   She started on weekly rituximab .   06/16/2014 Adverse Reaction   She had infusion reaction, resolved with IV dexamethasone .   08/18/2014 Imaging   PET CT scan show resolution of hypermetabolic activity.   04/08/2016 Imaging   1. No evidence for aortic dissection or vascular injury. 2. Mild atherosclerotic changes within the abdominal aorta and branch vessels without aneurysm. 3. No significant adenopathy or evidence for recurrent lymphoma. 4. Stable mild splenomegaly. 5. 5 mm nodule in the right lower lobe demonstrates interval increase in size. No follow-up needed if patient is low-risk. Non-contrast chest CT can be considered in 12 months if patient is high-risk.  6. Degenerate changes at the SI joints bilaterally.   01/21/2018 Imaging   1. Persistent mild splenomegaly, similar to prior examinations. No other findings to suggest recurrent disease in the chest, abdomen or pelvis. 2. Aortic atherosclerosis, in addition to two vessel coronary artery disease. Please note that although the presence of coronary artery calcium  documents the presence of coronary artery disease, the severity of this disease and any potential stenosis cannot be assessed on this non-gated CT examination. Assessment for potential risk factor modification, dietary therapy or pharmacologic therapy may be warranted, if clinically indicated.  3. Enlarging heterogeneously enhancing and partially calcified left thyroid  lobe nodule. Further evaluation with nonemergent thyroid  ultrasound is recommended in the near future to better evaluate this lesion and determine if there is a need for fine-needle aspiration. 4. Additional incidental findings, as above.   09/28/2020 Imaging   1. Progressive splenomegaly. The splenic volume has  almost doubled since 2019. 2. No adenopathy in the chest, abdomen or pelvis. 3. Stable fibroid uterus. 4. Moderate stool throughout the colon and down into the rectum suggesting constipation.   Aortic Atherosclerosis (ICD10-I70.0).   09/28/2020 Cancer Staging   Staging form: Hodgkin and Non-Hodgkin Lymphoma, AJCC 8th Edition - Clinical stage from 09/28/2020: Stage IE (Marginal zone lymphoma) - Signed by Lonn Hicks, MD on 09/28/2020   10/15/2020 - 04/10/2021 Chemotherapy    Patient received bendamustine  x 6 cycles.    05/10/2021 Imaging   1. No lymphadenopathy or other findings of recurrent lymphoma in the abdomen or pelvis. Spleen is normal size, decreased from prior. 2. Mild diffuse hepatic steatosis. 3. Mild sigmoid diverticulosis. 4. Enlarged myomatous uterus. 5. Coronary atherosclerosis. 6. Aortic Atherosclerosis (ICD10-I70.0).     PHYSICAL EXAMINATION: ECOG PERFORMANCE STATUS: 0 - Asymptomatic  Vitals:   11/17/23 0855  BP: 126/79  Pulse: 97  Resp: 18  Temp: 98.1 F (36.7 C)  SpO2: 97%   Filed Weights   11/17/23 0855  Weight: 246 lb (111.6 kg)    GENERAL:alert, no distress and comfortable SKIN: skin color, texture, turgor are normal, no rashes or significant lesions EYES: normal, Conjunctiva are pink and non-injected, sclera clear OROPHARYNX:no exudate, no erythema and lips, buccal mucosa, and tongue normal  NECK: supple, thyroid  normal size, non-tender, without nodularity LYMPH:  no palpable lymphadenopathy in the cervical, axillary or inguinal LUNGS:  clear to auscultation and percussion with normal breathing effort HEART: regular rate & rhythm and no murmurs and no lower extremity edema ABDOMEN:abdomen soft, non-tender and normal bowel sounds Musculoskeletal:no cyanosis of digits and no clubbing  NEURO: alert & oriented x 3 with fluent speech, no focal motor/sensory deficits  LABORATORY DATA:  I have reviewed the data as listed    Component Value Date/Time    NA 140 11/17/2023 0822   NA 138 07/24/2017 1105   K 4.7 11/17/2023 0822   K 4.2 07/24/2017 1105   CL 100 11/17/2023 0822   CL 106 11/19/2012 0837   CO2 30 11/17/2023 0822   CO2 26 07/24/2017 1105   GLUCOSE 141 (H) 11/17/2023 0822   GLUCOSE 362 (H) 07/24/2017 1105   GLUCOSE 107 (H) 11/19/2012 0837   BUN 18 11/17/2023 0822   BUN 10.0 07/24/2017 1105   CREATININE 0.88 11/17/2023 0822   CREATININE 0.82 08/08/2022 0801   CREATININE 0.8 07/24/2017 1105   CALCIUM  9.8 11/17/2023 0822   CALCIUM  8.8 07/24/2017 1105   PROT 7.1 11/17/2023 0822   PROT 6.3 (L) 07/24/2017 1105   ALBUMIN 4.6 11/17/2023 0822   ALBUMIN 3.4 (L) 07/24/2017 1105   AST 16 11/17/2023 0822   AST 12 (L) 08/08/2022 0801   AST 13 07/24/2017 1105   ALT 15 11/17/2023 0822   ALT 13 08/08/2022 0801   ALT 21 07/24/2017 1105   ALKPHOS 66 11/17/2023 0822   ALKPHOS 112 07/24/2017 1105   BILITOT 0.7 11/17/2023 0822   BILITOT 0.6 08/08/2022 0801   BILITOT 0.55 07/24/2017 1105   GFRNONAA >60 11/17/2023 0822   GFRNONAA >60 08/08/2022 0801   GFRAA >60 05/18/2020 0825   GFRAA >60 10/22/2018 0809    No results found for: SPEP, UPEP  Lab Results  Component Value Date   WBC 5.7 11/17/2023   NEUTROABS 4.1 11/17/2023   HGB 15.1 (H) 11/17/2023   HCT 45.7 11/17/2023   MCV 79.3 (L) 11/17/2023   PLT 196 11/17/2023      Chemistry      Component Value Date/Time   NA 140 11/17/2023 0822   NA 138 07/24/2017 1105   K 4.7 11/17/2023 0822   K 4.2 07/24/2017 1105   CL 100 11/17/2023 0822   CL 106 11/19/2012 0837   CO2 30 11/17/2023 0822   CO2 26 07/24/2017 1105   BUN 18 11/17/2023 0822   BUN 10.0 07/24/2017 1105   CREATININE 0.88 11/17/2023 0822   CREATININE 0.82 08/08/2022 0801   CREATININE 0.8 07/24/2017 1105      Component Value Date/Time   CALCIUM  9.8 11/17/2023 0822   CALCIUM  8.8 07/24/2017 1105   ALKPHOS 66 11/17/2023 0822   ALKPHOS 112 07/24/2017 1105   AST 16 11/17/2023 0822   AST 12 (L) 08/08/2022  0801   AST 13 07/24/2017 1105   ALT 15 11/17/2023 0822   ALT 13 08/08/2022 0801   ALT 21 07/24/2017 1105   BILITOT 0.7 11/17/2023 0822   BILITOT 0.6 08/08/2022 0801   BILITOT 0.55 07/24/2017 1105

## 2023-11-17 NOTE — Assessment & Plan Note (Signed)
There is no signs of recurrent lymphoma I will see her again in 6 months for further follow-up We discussed the importance of aggressive lifestyle changes

## 2024-05-17 ENCOUNTER — Encounter: Payer: Self-pay | Admitting: Hematology and Oncology

## 2024-05-17 ENCOUNTER — Inpatient Hospital Stay: Payer: No Typology Code available for payment source | Attending: Hematology and Oncology

## 2024-05-17 ENCOUNTER — Inpatient Hospital Stay (HOSPITAL_BASED_OUTPATIENT_CLINIC_OR_DEPARTMENT_OTHER): Payer: No Typology Code available for payment source | Admitting: Hematology and Oncology

## 2024-05-17 VITALS — BP 117/72 | HR 93 | Temp 97.4°F | Resp 18 | Ht 67.0 in | Wt 254.2 lb

## 2024-05-17 DIAGNOSIS — C8307 Small cell B-cell lymphoma, spleen: Secondary | ICD-10-CM

## 2024-05-17 DIAGNOSIS — Z8572 Personal history of non-Hodgkin lymphomas: Secondary | ICD-10-CM | POA: Insufficient documentation

## 2024-05-17 DIAGNOSIS — E118 Type 2 diabetes mellitus with unspecified complications: Secondary | ICD-10-CM | POA: Diagnosis not present

## 2024-05-17 DIAGNOSIS — E119 Type 2 diabetes mellitus without complications: Secondary | ICD-10-CM | POA: Insufficient documentation

## 2024-05-17 LAB — CBC WITH DIFFERENTIAL/PLATELET
Abs Immature Granulocytes: 0.03 K/uL (ref 0.00–0.07)
Basophils Absolute: 0.1 K/uL (ref 0.0–0.1)
Basophils Relative: 1 %
Eosinophils Absolute: 0.2 K/uL (ref 0.0–0.5)
Eosinophils Relative: 3 %
HCT: 42.4 % (ref 36.0–46.0)
Hemoglobin: 14.2 g/dL (ref 12.0–15.0)
Immature Granulocytes: 0 %
Lymphocytes Relative: 15 %
Lymphs Abs: 1 K/uL (ref 0.7–4.0)
MCH: 26.2 pg (ref 26.0–34.0)
MCHC: 33.5 g/dL (ref 30.0–36.0)
MCV: 78.2 fL — ABNORMAL LOW (ref 80.0–100.0)
Monocytes Absolute: 0.6 K/uL (ref 0.1–1.0)
Monocytes Relative: 8 %
Neutro Abs: 5 K/uL (ref 1.7–7.7)
Neutrophils Relative %: 73 %
Platelets: 187 K/uL (ref 150–400)
RBC: 5.42 MIL/uL — ABNORMAL HIGH (ref 3.87–5.11)
RDW: 14.1 % (ref 11.5–15.5)
WBC: 6.8 K/uL (ref 4.0–10.5)
nRBC: 0 % (ref 0.0–0.2)

## 2024-05-17 LAB — COMPREHENSIVE METABOLIC PANEL WITH GFR
ALT: 24 U/L (ref 0–44)
AST: 19 U/L (ref 15–41)
Albumin: 4.3 g/dL (ref 3.5–5.0)
Alkaline Phosphatase: 83 U/L (ref 38–126)
Anion gap: 10 (ref 5–15)
BUN: 18 mg/dL (ref 6–20)
CO2: 29 mmol/L (ref 22–32)
Calcium: 9 mg/dL (ref 8.9–10.3)
Chloride: 103 mmol/L (ref 98–111)
Creatinine, Ser: 0.88 mg/dL (ref 0.44–1.00)
GFR, Estimated: >60 mL/min (ref >60)
Glucose, Bld: 264 mg/dL — ABNORMAL HIGH (ref 70–99)
Potassium: 3.7 mmol/L (ref 3.5–5.1)
Sodium: 142 mmol/L (ref 135–145)
Total Bilirubin: 0.6 mg/dL (ref 0.0–1.2)
Total Protein: 6.8 g/dL (ref 6.5–8.1)

## 2024-05-17 NOTE — Assessment & Plan Note (Addendum)
 She was originally diagnosed with stage IV splenic marginal zone lymphoma in 2010.  In 2015, she was treated with rituximab  with complete response.  She has disease relapse in 2022 and was treated with 6 cycles of Bendamustine  only with complete response  She is currently on active surveillance She is asymptomatic Examination and blood work show no signs of cancer recurrence I will space out her appointment to 9 months

## 2024-05-17 NOTE — Progress Notes (Signed)
 [facility] OFFICE PROGRESS NOTE  Patient Care Team: Redmon, Noelle, [place] as PCP - General (Nurse Practitioner) Lonn Hicks, MD as Consulting Physician (Hematology and Oncology)  Assessment & Plan Splenic marginal zone b-cell lymphoma [facility]) She was originally diagnosed with stage IV splenic marginal zone lymphoma in 2010.  In 2015, she was treated with rituximab  with complete response.  She has disease relapse in 2022 and was treated with 6 cycles of Bendamustine  only with complete response  She is currently on active surveillance She is asymptomatic Examination and blood work show no signs of cancer recurrence I will space out her appointment to 9 months Type 2 diabetes mellitus with complication, without long-term current use of insulin  (HCC) We discussed the importance of dietary modification and lifestyle changes She has major cardiovascular risk factors  No orders of the defined types were placed in this encounter.    Hicks Lonn, MD  INTERVAL HISTORY: she returns for surveillance follow-up for history of recurrent lymphoma She denies lymphadenopathy or recurrent infection  PHYSICAL EXAMINATION: ECOG PERFORMANCE STATUS: 0 - Asymptomatic  Vitals:   05/17/24 0806  BP: 117/72  Pulse: 93  Resp: 18  Temp: (!) 97.4 F (36.3 C)  SpO2: 99%   Filed Weights   05/17/24 0806  Weight: 254 lb 3.2 oz (115.3 kg)   GENERAL:alert, no distress and comfortable SKIN: skin color, texture, turgor are normal, no rashes or significant lesions EYES: normal, conjunctiva are pink and non-injected, sclera clear OROPHARYNX:no exudate, no erythema and lips, buccal mucosa, and tongue normal  NECK: supple, thyroid  normal size, non-tender, without nodularity LYMPH:  no palpable lymphadenopathy in the cervical, axillary or inguinal LUNGS: clear to auscultation and percussion with normal breathing effort HEART: regular rate & rhythm and no murmurs and no lower extremity  edema ABDOMEN:abdomen soft, non-tender and normal bowel sounds Musculoskeletal:no cyanosis of digits and no clubbing  PSYCH: alert & oriented x 3 with fluent speech NEURO: no focal motor/sensory deficits  Relevant data reviewed during this visit included CBC and CMP

## 2024-05-17 NOTE — Assessment & Plan Note (Addendum)
 We discussed the importance of dietary modification and lifestyle changes She has major cardiovascular risk factors

## 2024-07-15 ENCOUNTER — Other Ambulatory Visit: Payer: Self-pay | Admitting: Family Medicine

## 2024-07-15 DIAGNOSIS — Z1231 Encounter for screening mammogram for malignant neoplasm of breast: Secondary | ICD-10-CM

## 2024-07-27 ENCOUNTER — Ambulatory Visit
Admission: RE | Admit: 2024-07-27 | Discharge: 2024-07-27 | Disposition: A | Discharge status: Home / Self Care | Source: Ambulatory Visit | Attending: Family Medicine | Admitting: Family Medicine

## 2024-07-27 DIAGNOSIS — Z1231 Encounter for screening mammogram for malignant neoplasm of breast: Secondary | ICD-10-CM

## 2024-08-06 ENCOUNTER — Encounter (HOSPITAL_COMMUNITY): Payer: Self-pay | Admitting: Emergency Medicine

## 2024-08-06 ENCOUNTER — Emergency Department (HOSPITAL_COMMUNITY)

## 2024-08-06 ENCOUNTER — Inpatient Hospital Stay (HOSPITAL_COMMUNITY)

## 2024-08-06 ENCOUNTER — Other Ambulatory Visit: Payer: Self-pay

## 2024-08-06 ENCOUNTER — Inpatient Hospital Stay (HOSPITAL_COMMUNITY)
Admission: EM | Admit: 2024-08-06 | Discharge: 2024-08-10 | DRG: 388 | Disposition: A | Discharge status: Home / Self Care | Attending: Internal Medicine | Admitting: Internal Medicine

## 2024-08-06 DIAGNOSIS — E1165 Type 2 diabetes mellitus with hyperglycemia: Secondary | ICD-10-CM | POA: Diagnosis present

## 2024-08-06 DIAGNOSIS — Z955 Presence of coronary angioplasty implant and graft: Secondary | ICD-10-CM | POA: Diagnosis not present

## 2024-08-06 DIAGNOSIS — K56609 Unspecified intestinal obstruction, unspecified as to partial versus complete obstruction: Secondary | ICD-10-CM | POA: Diagnosis not present

## 2024-08-06 DIAGNOSIS — R109 Unspecified abdominal pain: Secondary | ICD-10-CM | POA: Diagnosis not present

## 2024-08-06 DIAGNOSIS — L700 Acne vulgaris: Secondary | ICD-10-CM | POA: Insufficient documentation

## 2024-08-06 DIAGNOSIS — K760 Fatty (change of) liver, not elsewhere classified: Secondary | ICD-10-CM | POA: Diagnosis present

## 2024-08-06 DIAGNOSIS — I5033 Acute on chronic diastolic (congestive) heart failure: Secondary | ICD-10-CM | POA: Diagnosis present

## 2024-08-06 DIAGNOSIS — D225 Melanocytic nevi of trunk: Secondary | ICD-10-CM | POA: Insufficient documentation

## 2024-08-06 DIAGNOSIS — E7800 Pure hypercholesterolemia, unspecified: Secondary | ICD-10-CM | POA: Diagnosis present

## 2024-08-06 DIAGNOSIS — D649 Anemia, unspecified: Secondary | ICD-10-CM | POA: Diagnosis present

## 2024-08-06 DIAGNOSIS — I10 Essential (primary) hypertension: Secondary | ICD-10-CM | POA: Diagnosis present

## 2024-08-06 DIAGNOSIS — I252 Old myocardial infarction: Secondary | ICD-10-CM

## 2024-08-06 DIAGNOSIS — I2510 Atherosclerotic heart disease of native coronary artery without angina pectoris: Secondary | ICD-10-CM | POA: Diagnosis present

## 2024-08-06 DIAGNOSIS — E66812 Obesity, class 2: Secondary | ICD-10-CM | POA: Diagnosis present

## 2024-08-06 DIAGNOSIS — R000 Tachycardia, unspecified: Secondary | ICD-10-CM | POA: Diagnosis present

## 2024-08-06 DIAGNOSIS — Z923 Personal history of irradiation: Secondary | ICD-10-CM

## 2024-08-06 DIAGNOSIS — Z833 Family history of diabetes mellitus: Secondary | ICD-10-CM | POA: Diagnosis not present

## 2024-08-06 DIAGNOSIS — Z6839 Body mass index (BMI) 39.0-39.9, adult: Secondary | ICD-10-CM | POA: Diagnosis not present

## 2024-08-06 DIAGNOSIS — I110 Hypertensive heart disease with heart failure: Secondary | ICD-10-CM | POA: Diagnosis present

## 2024-08-06 DIAGNOSIS — Z79899 Other long term (current) drug therapy: Secondary | ICD-10-CM

## 2024-08-06 DIAGNOSIS — E785 Hyperlipidemia, unspecified: Secondary | ICD-10-CM | POA: Diagnosis present

## 2024-08-06 DIAGNOSIS — K565 Intestinal adhesions [bands], unspecified as to partial versus complete obstruction: Secondary | ICD-10-CM | POA: Diagnosis present

## 2024-08-06 DIAGNOSIS — Z7985 Long-term (current) use of injectable non-insulin antidiabetic drugs: Secondary | ICD-10-CM | POA: Diagnosis not present

## 2024-08-06 DIAGNOSIS — Z8249 Family history of ischemic heart disease and other diseases of the circulatory system: Secondary | ICD-10-CM | POA: Diagnosis not present

## 2024-08-06 DIAGNOSIS — E118 Type 2 diabetes mellitus with unspecified complications: Secondary | ICD-10-CM | POA: Diagnosis not present

## 2024-08-06 DIAGNOSIS — Z7984 Long term (current) use of oral hypoglycemic drugs: Secondary | ICD-10-CM | POA: Diagnosis not present

## 2024-08-06 DIAGNOSIS — Z9221 Personal history of antineoplastic chemotherapy: Secondary | ICD-10-CM | POA: Diagnosis not present

## 2024-08-06 DIAGNOSIS — D259 Leiomyoma of uterus, unspecified: Secondary | ICD-10-CM | POA: Diagnosis present

## 2024-08-06 DIAGNOSIS — Z8572 Personal history of non-Hodgkin lymphomas: Secondary | ICD-10-CM

## 2024-08-06 DIAGNOSIS — Z7982 Long term (current) use of aspirin: Secondary | ICD-10-CM | POA: Diagnosis not present

## 2024-08-06 DIAGNOSIS — Z87891 Personal history of nicotine dependence: Secondary | ICD-10-CM | POA: Diagnosis not present

## 2024-08-06 DIAGNOSIS — R1030 Lower abdominal pain, unspecified: Secondary | ICD-10-CM | POA: Diagnosis present

## 2024-08-06 LAB — CBC WITH DIFFERENTIAL/PLATELET
Abs Immature Granulocytes: 0.02 K/uL (ref 0.00–0.07)
Basophils Absolute: 0 K/uL (ref 0.0–0.1)
Basophils Relative: 0 %
Eosinophils Absolute: 0 K/uL (ref 0.0–0.5)
Eosinophils Relative: 0 %
HCT: 43.2 % (ref 36.0–46.0)
Hemoglobin: 13.9 g/dL (ref 12.0–15.0)
Immature Granulocytes: 0 %
Lymphocytes Relative: 12 %
Lymphs Abs: 1 K/uL (ref 0.7–4.0)
MCH: 25.5 pg — ABNORMAL LOW (ref 26.0–34.0)
MCHC: 32.2 g/dL (ref 30.0–36.0)
MCV: 79.3 fL — ABNORMAL LOW (ref 80.0–100.0)
Monocytes Absolute: 0.6 K/uL (ref 0.1–1.0)
Monocytes Relative: 7 %
Neutro Abs: 6.7 K/uL (ref 1.7–7.7)
Neutrophils Relative %: 81 %
Platelets: 218 K/uL (ref 150–400)
RBC: 5.45 MIL/uL — ABNORMAL HIGH (ref 3.87–5.11)
RDW: 14.7 % (ref 11.5–15.5)
WBC: 8.3 K/uL (ref 4.0–10.5)
nRBC: 0 % (ref 0.0–0.2)

## 2024-08-06 LAB — COMPREHENSIVE METABOLIC PANEL WITH GFR
ALT: 26 U/L (ref 0–44)
AST: 20 U/L (ref 15–41)
Albumin: 4.3 g/dL (ref 3.5–5.0)
Alkaline Phosphatase: 89 U/L (ref 38–126)
Anion gap: 14 (ref 5–15)
BUN: 17 mg/dL (ref 6–20)
CO2: 24 mmol/L (ref 22–32)
Calcium: 9.6 mg/dL (ref 8.9–10.3)
Chloride: 104 mmol/L (ref 98–111)
Creatinine, Ser: 0.85 mg/dL (ref 0.44–1.00)
GFR, Estimated: >60 mL/min (ref >60)
Glucose, Bld: 340 mg/dL — ABNORMAL HIGH (ref 70–99)
Potassium: 4.8 mmol/L (ref 3.5–5.1)
Sodium: 142 mmol/L (ref 135–145)
Total Bilirubin: 0.6 mg/dL (ref 0.0–1.2)
Total Protein: 6.9 g/dL (ref 6.5–8.1)

## 2024-08-06 LAB — URINALYSIS, W/ REFLEX TO CULTURE (INFECTION SUSPECTED)
Bacteria, UA: NONE SEEN
Bilirubin Urine: NEGATIVE
Glucose, UA: >=500 mg/dL — AB
Hgb urine dipstick: NEGATIVE
Ketones, ur: 20 mg/dL — AB
Leukocytes,Ua: NEGATIVE
Nitrite: NEGATIVE
Protein, ur: NEGATIVE mg/dL
Specific Gravity, Urine: 1.031 — ABNORMAL HIGH (ref 1.005–1.030)
pH: 5 (ref 5.0–8.0)

## 2024-08-06 LAB — CBG MONITORING, ED: Glucose-Capillary: 218 mg/dL — ABNORMAL HIGH (ref 70–99)

## 2024-08-06 LAB — GLUCOSE, CAPILLARY
Glucose-Capillary: 197 mg/dL — ABNORMAL HIGH (ref 70–99)
Glucose-Capillary: 265 mg/dL — ABNORMAL HIGH (ref 70–99)

## 2024-08-06 LAB — LIPASE, BLOOD: Lipase: 21 U/L (ref 11–51)

## 2024-08-06 LAB — HEMOGLOBIN A1C
Hgb A1c MFr Bld: 9.1 % — ABNORMAL HIGH (ref 4.8–5.6)
Mean Plasma Glucose: 214.47 mg/dL

## 2024-08-06 LAB — I-STAT CG4 LACTIC ACID, ED: Lactic Acid, Venous: 1.5 mmol/L (ref 0.5–1.9)

## 2024-08-06 MED ORDER — ONDANSETRON HCL 4 MG/2ML IJ SOLN
4.0000 mg | Freq: Once | INTRAMUSCULAR | Status: AC
  Administered 2024-08-06: 4 mg via INTRAVENOUS
  Filled 2024-08-06: qty 2

## 2024-08-06 MED ORDER — INSULIN ASPART 100 UNIT/ML IJ SOLN
0.0000 [IU] | INTRAMUSCULAR | Status: DC
  Administered 2024-08-06: 8 [IU] via SUBCUTANEOUS
  Administered 2024-08-06: 5 [IU] via SUBCUTANEOUS
  Administered 2024-08-06: 3 [IU] via SUBCUTANEOUS
  Administered 2024-08-07: 5 [IU] via SUBCUTANEOUS
  Administered 2024-08-07: 2 [IU] via SUBCUTANEOUS
  Administered 2024-08-07: 3 [IU] via SUBCUTANEOUS
  Administered 2024-08-07: 2 [IU] via SUBCUTANEOUS
  Administered 2024-08-07: 3 [IU] via SUBCUTANEOUS
  Administered 2024-08-07 – 2024-08-08 (×3): 2 [IU] via SUBCUTANEOUS
  Administered 2024-08-08: 3 [IU] via SUBCUTANEOUS
  Administered 2024-08-08: 2 [IU] via SUBCUTANEOUS
  Administered 2024-08-08 – 2024-08-09 (×3): 3 [IU] via SUBCUTANEOUS
  Administered 2024-08-09 – 2024-08-10 (×3): 2 [IU] via SUBCUTANEOUS
  Filled 2024-08-06: qty 0.15

## 2024-08-06 MED ORDER — MAGIC MOUTHWASH
15.0000 mL | Freq: Four times a day (QID) | ORAL | Status: DC | PRN
  Administered 2024-08-07: 15 mL via ORAL
  Filled 2024-08-06 (×2): qty 15

## 2024-08-06 MED ORDER — MENTHOL 3 MG MT LOZG: 1.0000 | LOZENGE | OROMUCOSAL | Status: DC | PRN

## 2024-08-06 MED ORDER — MORPHINE SULFATE (PF) 4 MG/ML IV SOLN
4.0000 mg | Freq: Once | INTRAVENOUS | Status: AC
  Administered 2024-08-06: 4 mg via INTRAVENOUS
  Filled 2024-08-06: qty 1

## 2024-08-06 MED ORDER — SALINE SPRAY 0.65 % NA SOLN: 1.0000 | Freq: Four times a day (QID) | NASAL | Status: DC | PRN

## 2024-08-06 MED ORDER — SODIUM CHLORIDE 0.9 % IV SOLN: Freq: Once | INTRAVENOUS | Status: DC

## 2024-08-06 MED ORDER — PROCHLORPERAZINE EDISYLATE 10 MG/2ML IJ SOLN
5.0000 mg | INTRAMUSCULAR | Status: DC | PRN
  Administered 2024-08-06: 10 mg via INTRAVENOUS
  Administered 2024-08-07: 5 mg via INTRAVENOUS
  Administered 2024-08-07 – 2024-08-08 (×3): 10 mg via INTRAVENOUS
  Filled 2024-08-06 (×5): qty 2

## 2024-08-06 MED ORDER — NAPHAZOLINE-GLYCERIN 0.012-0.25 % OP SOLN: 1.0000 [drp] | Freq: Four times a day (QID) | OPHTHALMIC | Status: DC | PRN

## 2024-08-06 MED ORDER — ONDANSETRON HCL 4 MG/2ML IJ SOLN
4.0000 mg | Freq: Four times a day (QID) | INTRAMUSCULAR | Status: DC | PRN
  Administered 2024-08-06 – 2024-08-08 (×5): 4 mg via INTRAVENOUS
  Filled 2024-08-06 (×5): qty 2

## 2024-08-06 MED ORDER — DIPHENHYDRAMINE HCL 50 MG/ML IJ SOLN: 12.5000 mg | Freq: Four times a day (QID) | INTRAMUSCULAR | Status: DC | PRN

## 2024-08-06 MED ORDER — ALUM & MAG HYDROXIDE-SIMETH 200-200-20 MG/5ML PO SUSP
30.0000 mL | Freq: Four times a day (QID) | ORAL | Status: DC | PRN
  Administered 2024-08-06: 30 mL via ORAL
  Filled 2024-08-06: qty 30

## 2024-08-06 MED ORDER — LACTATED RINGERS IV BOLUS
1000.0000 mL | Freq: Once | INTRAVENOUS | Status: AC
  Administered 2024-08-06: 1000 mL via INTRAVENOUS

## 2024-08-06 MED ORDER — SIMETHICONE 40 MG/0.6ML PO SUSP
80.0000 mg | Freq: Four times a day (QID) | ORAL | Status: DC | PRN
Start: 2024-08-06 — End: 2024-08-10

## 2024-08-06 MED ORDER — HYDROMORPHONE HCL 1 MG/ML IJ SOLN
0.5000 mg | INTRAMUSCULAR | Status: DC | PRN
  Administered 2024-08-06 – 2024-08-07 (×4): 1 mg via INTRAVENOUS
  Administered 2024-08-07 (×2): 0.5 mg via INTRAVENOUS
  Administered 2024-08-08 – 2024-08-09 (×6): 1 mg via INTRAVENOUS
  Filled 2024-08-06 (×12): qty 1

## 2024-08-06 MED ORDER — BISACODYL 10 MG RE SUPP
10.0000 mg | Freq: Every day | RECTAL | Status: DC
  Administered 2024-08-06 – 2024-08-07 (×2): 10 mg via RECTAL
  Filled 2024-08-06 (×3): qty 1

## 2024-08-06 MED ORDER — METOPROLOL TARTRATE 5 MG/5ML IV SOLN
5.0000 mg | Freq: Three times a day (TID) | INTRAVENOUS | Status: DC
Start: 2024-08-06 — End: 2024-08-09
  Administered 2024-08-06 – 2024-08-09 (×10): 5 mg via INTRAVENOUS
  Filled 2024-08-06 (×10): qty 5

## 2024-08-06 MED ORDER — IOHEXOL 300 MG/ML  SOLN
100.0000 mL | Freq: Once | INTRAMUSCULAR | Status: AC | PRN
  Administered 2024-08-06: 100 mL via INTRAVENOUS

## 2024-08-06 MED ORDER — METHOCARBAMOL 1000 MG/10ML IJ SOLN
1000.0000 mg | Freq: Four times a day (QID) | INTRAMUSCULAR | Status: DC | PRN
  Administered 2024-08-07 – 2024-08-09 (×3): 1000 mg via INTRAVENOUS
  Filled 2024-08-06 (×3): qty 10

## 2024-08-06 MED ORDER — LACTATED RINGERS IV BOLUS: 1000.0000 mL | Freq: Three times a day (TID) | INTRAVENOUS | Status: AC | PRN

## 2024-08-06 MED ORDER — PHENOL 1.4 % MT LIQD
2.0000 | OROMUCOSAL | Status: DC | PRN
  Administered 2024-08-06: 2 via OROMUCOSAL
  Filled 2024-08-06: qty 177

## 2024-08-06 MED ORDER — SODIUM CHLORIDE 0.9 % IV SOLN: INTRAVENOUS | Status: DC

## 2024-08-06 MED ORDER — DIATRIZOATE MEGLUMINE & SODIUM 66-10 % PO SOLN
90.0000 mL | Freq: Once | ORAL | Status: DC
  Filled 2024-08-06: qty 90

## 2024-08-06 NOTE — ED Notes (Signed)
 X-ray at bedside.

## 2024-08-06 NOTE — H&P (Signed)
 History and Physical  Patient: Desiree [name] FMW:[id number] DOB: Jun 27, 1966 DOA: 08/06/2024 DOS: the patient was seen and examined on 08/06/2024 Patient coming from: Home  Chief Complaint:  Chief Complaint  Patient presents with   Abdominal Pain   HPI: Desiree [name] is a 58 y.o. female with PMH significant of non-Hodgkin lymphoma, follows Dr. Krista surgery, hypertension, hyperlipidemia, chronic anemia, diabetes mellitus, palpitations, presented to ED with abdominal pain, nausea and vomiting.  Patient reports that her symptoms started yesterday around noon with intractable nausea vomiting and unable to keep anything down.  She also had lower abdominal pain 10/10, sharp and intermittent.  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   She lives in [place] and has all her doctors here hence came down to [place], ED.  At this time, she still has diffuse abdominal pain however the last time she had vomiting was 2 AM in the morning.  She had received morphine  and Zofran  at [facility] ER.  She had left prior to receiving NG tube for decompression.  Patient reports no prior prior history of SBO or abdominal surgeries in the past.  Last BM was yesterday General Surgery has been consulted and patient admitted for further workup. Of note history of colon cancer in older sister.   ED course:  In ED, temp 98.4 F, RR 16, heart rate 104-113 BP 116/73, O2 sats 98% on room air Labs unremarkable CT abdomen showed small bowel obstruction with transition point in the right lower quadrant likely due to adhesions.  Hepatomegaly, hepatic steatosis.  Minimal sigmoid colonic diverticulosis without diverticulitis.  Uterine fibroids  Review of Systems: As mentioned in the history of present illness. All other systems reviewed and are negative. Past Medical History:  Diagnosis Date   Anemia in neoplastic disease  05/26/2014   Diabetes (HCC)    Fatigue 11/17/2013   Female infertility    High cholesterol    Hx of heart artery stent    Hypertension    Iron deficiency anemia due to chronic blood loss    Leukocytosis 07/27/2011   Myocardial infarction (HCC) 03/2016   Non Hodgkin's lymphoma (HCC)    recieving chemo 06/2014   Palpitation 06/30/2014   Personal history of chemotherapy    Splenic marginal zone b-cell lymphoma (HCC) 01/2009   On observation   Tachycardia with 100 - 120 beats per minute 11/29/2014   Type II diabetes mellitus (HCC)    Past Surgical History:  Procedure Laterality Date   BONE MARROW BIOPSY  0215 X 2   CARDIAC CATHETERIZATION N/A 04/09/2016   Procedure: Left Heart Cath and Coronary Angiography;  Surgeon: Peter M Jordan, MD;  Location: [facility] INVASIVE CV LAB;  Service: Cardiovascular;  Laterality: N/A;   CARDIAC CATHETERIZATION N/A 04/09/2016   Procedure: Coronary Stent Intervention;  Surgeon: Peter M Jordan, MD;  Location: [facility] INVASIVE CV LAB;  Service: Cardiovascular;  Laterality: N/A;   CARDIAC CATHETERIZATION N/A 10/16/2016   Procedure: Left Heart Cath and Coronary Angiography;  Surgeon: Peter M Jordan, MD;  Location: [facility] INVASIVE CV LAB;  Service: Cardiovascular;  Laterality: N/A;   CARDIAC CATHETERIZATION N/A 10/16/2016   Procedure: Coronary Stent Intervention;  Surgeon: Peter M Jordan, MD;  Location: [facility] INVASIVE CV LAB;  Service: Cardiovascular;  Laterality: N/A;   CARDIAC CATHETERIZATION N/A 10/16/2016   Procedure: Intravascular Ultrasound/IVUS;  Surgeon: Peter M Jordan, MD;  Location: [facility] INVASIVE CV LAB;  Service:  Cardiovascular;  Laterality: N/A;   WISDOM TOOTH EXTRACTION     Social History:  reports that she quit smoking about 18 years ago. Her smoking use included cigarettes. She started smoking about 40 years ago. She has a 11 pack-year smoking history. She has never used smokeless tobacco. She reports current alcohol use. She reports that she does not use drugs. Allergies   Allergen Reactions   Erythromycin Anaphylaxis    Swelling and difficulty breathing.   Rituxan  [Rituximab ] Shortness Of Breath and Cough    Pt states that last time she had rituxan  her throat swelled up and it was difficult to breathe,    Lovastatin     Leg cramps   Tolnaftate Hives    Reaction caused by pills and the cream.   Family History  Problem Relation Age of Onset   Diabetes Mother    High blood pressure Mother    Heart disease Mother    Cancer Mother    Cancer Maternal Aunt        breast ca   Breast cancer Maternal Aunt        late 30s. recur multiple times   Cancer Maternal Uncle        lung ca   Thyroid  disease Neg Hx    Prior to Admission medications   Medication Sig Start Date End Date Taking? Authorizing Provider  acetaminophen  (TYLENOL ) 500 MG tablet Take 1 tablet (500 mg total) by mouth every 8 (eight) hours as needed (pain). 04/10/16   Jerri Keys, MD  aspirin  81 MG chewable tablet Chew 1 tablet (81 mg total) by mouth daily. 04/10/16   Jerri Keys, MD  Bempedoic Acid (NEXLETOL) 180 MG TABS Take 1 tablet by mouth daily.    [provider]  FARXIGA 10 MG TABS tablet Take 10 mg by mouth daily. 03/26/24   [provider]  fish oil-omega-3 fatty acids  1000 MG capsule Take 1 g by mouth 2 (two) times daily.     [provider]  furosemide  (LASIX ) 40 MG tablet Take 40 mg by mouth as needed.    [provider]  losartan  (COZAAR ) 25 MG tablet Take 25 mg by mouth daily.    [provider]  metFORMIN (GLUCOPHAGE) 1000 MG tablet Take 1,000 mg by mouth 2 (two) times daily with a meal.      [provider]  metoprolol  tartrate (LOPRESSOR ) 100 MG tablet Take 1 tablet (100 mg total) by mouth 2 (two) times daily. PT OVERDUE FOR OV PLEASE CALL FOR APPT 07/15/18   Burnard Debby LABOR, MD  nitroGLYCERIN  (NITROSTAT ) 0.4 MG SL tablet Place 1 tablet (0.4 mg total) under the tongue every 5 (five) minutes as needed for chest pain. 04/10/16   Jerri Keys,  MD  tirzepatide (MOUNJARO) 2.5 MG/0.5ML Pen Inject 2.5 mg into the skin once a week.    [provider]  Vitamin D , Ergocalciferol , (DRISDOL) 1.25 MG (50000 UNIT) CAPS capsule TAKE 1 CAPSULE BY MOUTH ONE TIME PER WEEK    [provider]   Physical Exam: Vitals:   08/06/24 0748 08/06/24 1123 08/06/24 1134  BP: 132/87  116/73  Pulse: (!) 113  (!) 104  Resp: 16  16  Temp: 98.4 F (36.9 C)  98.8 F (37.1 C)  TempSrc: Oral  Oral  SpO2: 98% 98% 98%     General: Alert, awake, oriented x3, NAD Eyes: pink conjunctiva, anicteric sclera, PERLA HEENT: normocephalic, atraumatic, oropharynx clear Neck: supple, no masses or lymphadenopathy, no  JVD CVS: Regular rate and rhythm, no murmurs, rubs or gallops. Resp : Clear to auscultation bilaterally, no wheezing, rales or rhonchi. GI : Soft,, mild lower abdominal/periumbilical TTP, hypoactive bowel sounds.  Ext: No lower extremity edema  Musculoskeletal: No clubbing or cyanosis, positive pedal pulses. No contracture. ROM intact  Neuro: Grossly intact, no focal neurological deficits, strength 5/5 upper and lower extremities bilaterally Psych: alert and oriented x 3, normal mood and affect Skin: no rashes or lesions, warm and dry   Data Reviewed: I have reviewed ED notes, Vitals, Lab results and outpatient records.   Recent Labs  Lab 08/06/24 0911  NA 142  K 4.8  CL 104  CO2 24  GLUCOSE 340*  BUN 17  CREATININE 0.85  CALCIUM  9.6   Recent Labs  Lab 08/06/24 0911  WBC 8.3  NEUTROABS 6.7  HGB 13.9  HCT 43.2  MCV 79.3*  PLT 218    Assessment and Plan Principal Problem:   SBO (small bowel obstruction) (HCC) -No prior history of SBO or abdominal surgeries.  Symptoms abruptly started around noon yesterday with multiple episodes of nausea and vomiting, abdominal pain, unable to hold anything down - CT abdomen confirmed SBO with transition point in the right lower quadrant - Continue n.p.o., IV fluids, pain  control, antiemetics - General Surgery has been consulted, currently no active vomiting during encounter, will defer to surgery regarding NGT.     Active Problems: Hypertension, tachycardia -Patient has known history of tachycardia and palpitation, on metoprolol  100 mg twice daily -Will place on IV Lopressor  5 mg every 8 hours -Hold losartan , Lasix .    Type 2 diabetes mellitus with complication, without long-term current use of insulin  (HCC) -Hold Mounjaro, metformin, Farxiga -Continue sliding scale insulin , moderate, every 4 hours, currently n.p.o. - Follow hemoglobin A1c    Hyperlipidemia, CAD - Hold aspirin , resume IV Lopressor     Advance Care Planning:   Code Status: Full Code discussed with the patient Consults: General surgery Family Communication: Sister at the bedside Severity of Illness:      The appropriate patient status for this patient is INPATIENT. Inpatient status is judged to be reasonable and necessary in order to provide the required intensity of service to ensure the patient's safety. The patient's presenting symptoms, physical exam findings, and initial radiographic and laboratory data in the context of their chronic comorbidities is felt to place them at high risk for further clinical deterioration. Furthermore, it is not anticipated that the patient will be medically stable for discharge from the hospital within 2 midnights of admission.   * I certify that at the point of admission it is my clinical judgment that the patient will require inpatient hospital care spanning beyond 2 midnights from the point of admission due to high intensity of service, high risk for further deterioration and high frequency of surveillance required.*    Author: Nydia Distance, MD 08/06/2024 1:29 PM For on call review [email or web address].

## 2024-08-06 NOTE — ED Triage Notes (Signed)
 Pt reports abdominal pain and vomiting that started at 12 noon yesterday, Pt reports that she was told last night in the ER that she had an obstruction in her bowel. Pt wants 2nd opinion.

## 2024-08-06 NOTE — ED Notes (Signed)
 RN was instructed pt's NG tube placement would follow consult to surgery.

## 2024-08-06 NOTE — ED Notes (Signed)
 Pt to CT

## 2024-08-06 NOTE — Progress Notes (Signed)
 Plan of Care Note (Preliminary telephone/chart evaluation)      Desiree [name]  Sep 02, 1966 [id number]  CARE TEAM:  PCP: Redmon, Noelle, PA  Outpatient Care Team: Patient Care Team: Redmon, Noelle, [place] as PCP - General (Nurse Practitioner) Lonn Hicks, MD as Consulting Physician (Hematology and Oncology)  Inpatient Treatment Team: Treatment Team:  Laurice Maude BROCKS, MD Ricafrente, Lucie RAMAN, NT Vincente Merino, RN [facility], Md, MD Bobbette Hercules, MD    Surgical advice by telephone at the request of Dr Laurice, [facility] .   Chief complaint / Reason for evaluation: SBO  Patient with abdominal pain.  Went to outside emergency department in [place] .  Concerning for bowel obstruction.  Patient skeptical.  Came down to [facility] for a second opinion.  Some complaints of abdominal pain and discomfort.  CAT scan shows dilated bowel with transition point suspicious for bowel obstruction.  Surgical consultation requested   Assessment   Problem List:  Active Problems:   Poorly controlled diabetes mellitus (HCC)   SBO (small bowel obstruction) (HCC)   Patient not in septic shock without any fever or leukocytosis or obvious peritonitis.  CAT scan does show dilated bowel with transition point suspicious for bowel obstruction.  I wonder if she has had a tubal ligation and does not recall.  There is no obvious mass or tumor causing obstruction.  She has no obvious umbilical inguinal or incisional hernias.  Patient would benefit from nasogastric tube decompression and small bowel protocol.  IV fluid resuscitation.  Nausea pain control.    Surgery will help follow.  Completed formal surgical consultation note pending     Elspeth KYM Schultze, MD, FACS, MASCRS Esophageal, Gastrointestinal & Colorectal Surgery Robotic and Minimally Invasive Surgery  [facility] [address] [place], [place] [address] [phone] Fax [phone] Main  CONTACT INFORMATION: Weekday (9AM-5PM): Call [facility] main office at [phone] Weeknight (5PM-9AM) or Weekend/Holiday: Check EPIC Web Links tab & use AMION (password  [email or web address]) for General Surgery [facility] coverage  Please, DO NOT use SecureChat  (it is not reliable communication to reach operating surgeons & will lead to a delay in care).   Epic staff messaging available for outpatient concerns needing 1-2 business day response.      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   On observation   Tachycardia with 100 - 120 beats per minute 11/29/2014   Type II diabetes mellitus (HCC)     Past Surgical History:  Procedure Laterality Date   BONE MARROW BIOPSY  0215 X 2   CARDIAC CATHETERIZATION N/A 04/09/2016   Procedure: Left Heart Cath and Coronary Angiography;  Surgeon: Peter M Jordan, MD;  Location: [facility] INVASIVE CV LAB;  Service: Cardiovascular;  Laterality: N/A;   CARDIAC CATHETERIZATION N/A 04/09/2016   Procedure: Coronary Stent Intervention;  Surgeon: Peter M Jordan, MD;  Location: [facility]  INVASIVE CV LAB;  Service: Cardiovascular;  Laterality: N/A;   CARDIAC CATHETERIZATION N/A 10/16/2016   Procedure: Left Heart Cath and Coronary Angiography;  Surgeon: Peter M Jordan, MD;  Location: [facility] INVASIVE CV LAB;  Service: Cardiovascular;  Laterality:  N/A;   CARDIAC CATHETERIZATION N/A 10/16/2016   Procedure: Coronary Stent Intervention;  Surgeon: Peter M Jordan, MD;  Location: [facility] INVASIVE CV LAB;  Service: Cardiovascular;  Laterality: N/A;   CARDIAC CATHETERIZATION N/A 10/16/2016   Procedure: Intravascular Ultrasound/IVUS;  Surgeon: Peter M Jordan, MD;  Location: [facility] INVASIVE CV LAB;  Service: Cardiovascular;  Laterality: N/A;   WISDOM TOOTH EXTRACTION      Social History   Socioeconomic History   Marital status: Married    Spouse name: Alphonso   Number of children: Not on file   Years of education: Not on file   Highest education level: Not on file  Occupational History   Occupation: [employer]  Tobacco Use   Smoking status: Former    Current packs/day: 0.00    Average packs/day: 0.5 packs/day for 22.0 years (11.0 ttl pk-yrs)    Types: Cigarettes    Start date: 10/14/1983    Quit date: 10/13/2005    Years since quitting: 18.8   Smokeless tobacco: Never  Substance and Sexual Activity   Alcohol use: Yes    Comment: 10/15/2016 nothing in the last 3 years   Drug use: No   Sexual activity: Yes    Birth control/protection: None  Other Topics Concern   Not on file  Social History Narrative   Not on file   Social Drivers of Health   Financial Resource Strain: Not on file  Food Insecurity: Not on file  Transportation Needs: Not on file  Physical Activity: Not on file  Stress: Not on file  Social Connections: Not on file  Intimate Partner Violence: Not on file    Family History  Problem Relation Age of Onset   Diabetes Mother    High blood pressure Mother    Heart disease Mother    Cancer Mother    Cancer Maternal Aunt        breast ca   Breast cancer Maternal Aunt        late 30s. recur multiple times   Cancer Maternal Uncle        lung ca   Thyroid  disease Neg Hx     Current Facility-Administered Medications  Medication Dose Route Frequency Provider Last Rate Last Admin   0.9 %  sodium chloride  infusion    Intravenous Once Messick, Peter C, MD       0.9 %  sodium chloride  infusion   Intravenous Once Messick, Peter C, MD       alum & mag hydroxide-simeth (MAALOX/MYLANTA) 200-200-20 MG/5ML suspension 30 mL  30 mL Oral Q6H PRN Sheldon Standing, MD       bisacodyl (DULCOLAX) suppository 10 mg  10 mg Rectal Daily Sheldon Standing, MD       diatrizoate meglumine-sodium (GASTROGRAFIN) 66-10 % solution 90 mL  90 mL Per NG tube Once Sheldon Standing, MD       diphenhydrAMINE  (BENADRYL ) injection 12.5-25 mg  12.5-25 mg Intravenous Q6H PRN Sheldon Standing, MD       HYDROmorphone (DILAUDID) injection 0.5-2 mg  0.5-2 mg Intravenous Q2H PRN Sheldon Standing, MD       insulin  aspart (novoLOG ) injection 0-15 Units  0-15 Units Subcutaneous Q4H Sheldon Standing, MD       lactated ringers bolus 1,000  mL  1,000 mL Intravenous Once Sheldon Standing, MD       lactated ringers bolus 1,000 mL  1,000 mL Intravenous Q8H PRN Sheldon Standing, MD       magic mouthwash  15 mL Oral QID PRN Sheldon Standing, MD       menthol (CEPACOL) lozenge 3 mg  1 lozenge Oral PRN Sheldon Standing, MD       methocarbamol  (ROBAXIN ) injection 1,000 mg  1,000 mg Intravenous Q6H PRN Sheldon Standing, MD       morphine  (PF) 4 MG/ML injection 4 mg  4 mg Intravenous Once Messick, Peter C, MD       naphazoline-glycerin (CLEAR EYES REDNESS) ophth solution 1-2 drop  1-2 drop Both Eyes QID PRN Sheldon Standing, MD       ondansetron  (ZOFRAN ) injection 4 mg  4 mg Intravenous Once Messick, Peter C, MD       ondansetron  (ZOFRAN ) injection 4 mg  4 mg Intravenous Q6H PRN Sheldon Standing, MD       phenol (CHLORASEPTIC) mouth spray 2 spray  2 spray Mouth/Throat PRN Sheldon Standing, MD       prochlorperazine  (COMPAZINE ) injection 5-10 mg  5-10 mg Intravenous Q4H PRN Sheldon Standing, MD       simethicone  (MYLICON) 40 MG/0.6ML suspension 80 mg  80 mg Oral QID PRN Sheldon Standing, MD       sodium chloride  (OCEAN) 0.65 % nasal spray 1-2 spray  1-2 spray Each Nare Q6H PRN Sheldon Standing, MD       Current  Outpatient Medications  Medication Sig Dispense Refill   acetaminophen  (TYLENOL ) 500 MG tablet Take 1 tablet (500 mg total) by mouth every 8 (eight) hours as needed (pain). 30 tablet 0   aspirin  81 MG chewable tablet Chew 1 tablet (81 mg total) by mouth daily. 30 tablet 0   Bempedoic Acid (NEXLETOL) 180 MG TABS Take 1 tablet by mouth daily.     FARXIGA 10 MG TABS tablet Take 10 mg by mouth daily.     fish oil-omega-3 fatty acids  1000 MG capsule Take 1 g by mouth 2 (two) times daily.      furosemide  (LASIX ) 40 MG tablet Take 40 mg by mouth as needed.     losartan  (COZAAR ) 25 MG tablet Take 25 mg by mouth daily.     metFORMIN (GLUCOPHAGE) 1000 MG tablet Take 1,000 mg by mouth 2 (two) times daily with a meal.       metoprolol  tartrate (LOPRESSOR ) 100 MG tablet Take 1 tablet (100 mg total) by mouth 2 (two) times daily. PT OVERDUE FOR OV PLEASE CALL FOR APPT 60 tablet 0   nitroGLYCERIN  (NITROSTAT ) 0.4 MG SL tablet Place 1 tablet (0.4 mg total) under the tongue every 5 (five) minutes as needed for chest pain. 30 tablet 0   tirzepatide (MOUNJARO) 2.5 MG/0.5ML Pen Inject 2.5 mg into the skin once a week.     Vitamin D , Ergocalciferol , (DRISDOL) 1.25 MG (50000 UNIT) CAPS capsule TAKE 1 CAPSULE BY MOUTH ONE TIME PER WEEK       Allergies  Allergen Reactions   Erythromycin Anaphylaxis    Swelling and difficulty breathing.   Rituxan  [Rituximab ] Shortness Of Breath and Cough    Pt states that last time she had rituxan  her throat swelled up and it was difficult to breathe,    Lovastatin     Leg cramps   Tolnaftate Hives    Reaction caused by pills and the cream.  BP 116/73 (BP Location: Right Arm)   Pulse (!) 104   Temp 98.8 F (37.1 C) (Oral)   Resp 16   LMP 01/20/2017   SpO2 98%     Results:   Labs: Results for orders placed or performed during the hospital encounter of 08/06/24 (from the past 48 hours)  Urinalysis, w/ Reflex to Culture (Infection Suspected) -Urine, Clean Catch      Status: Abnormal   Collection Time: 08/06/24  8:25 AM  Result Value Ref Range   Specimen Source URINE, CLEAN CATCH    Color, Urine YELLOW YELLOW   APPearance CLEAR CLEAR   Specific Gravity, Urine 1.031 (H) 1.005 - 1.030   pH 5.0 5.0 - 8.0   Glucose, UA >=500 (A) NEGATIVE mg/dL   Hgb urine dipstick NEGATIVE NEGATIVE   Bilirubin Urine NEGATIVE NEGATIVE   Ketones, ur 20 (A) NEGATIVE mg/dL   Protein, ur NEGATIVE NEGATIVE mg/dL   Nitrite NEGATIVE NEGATIVE   Leukocytes,Ua NEGATIVE NEGATIVE   RBC / HPF 0-5 0 - 5 RBC/hpf   WBC, UA 0-5 0 - 5 WBC/hpf    Comment:        Reflex urine culture not performed if WBC <=10, OR if Squamous epithelial cells >5. If Squamous epithelial cells >5 suggest recollection.    Bacteria, UA NONE SEEN NONE SEEN   Squamous Epithelial / HPF 0-5 0 - 5 /HPF   Mucus PRESENT     Comment: Performed at [facility], [address] [place], [place] [address]  Lipase, blood     Status: None   Collection Time: 08/06/24  9:11 AM  Result Value Ref Range   Lipase 21 11 - 51 U/L    Comment: Performed at [facility], [address] [place], [place] [address]  Comprehensive metabolic panel     Status: Abnormal   Collection Time: 08/06/24  9:11 AM  Result Value Ref Range   Sodium 142 135 - 145 mmol/L   Potassium 4.8 3.5 - 5.1 mmol/L   Chloride 104 98 - 111 mmol/L   CO2 24 22 - 32 mmol/L   Glucose, Bld 340 (H) 70 - 99 mg/dL    Comment: Glucose reference range applies only to samples taken after fasting for at least 8 hours.   BUN 17 6 - 20 mg/dL   Creatinine, Ser 9.14 0.44 - 1.00 mg/dL   Calcium  9.6 8.9 - 10.3 mg/dL   Total Protein 6.9 6.5 - 8.1 g/dL   Albumin 4.3 3.5 - 5.0 g/dL   AST 20 15 - 41 U/L   ALT 26 0 - 44 U/L   Alkaline Phosphatase 89 38 - 126 U/L   Total Bilirubin 0.6 0.0 - 1.2 mg/dL   GFR, Estimated >39 >39 mL/min    Comment: (NOTE) Calculated using the CKD-EPI Creatinine Equation (2021)    Anion gap 14 5 - 15     Comment: Performed at [facility], [address] [place], [place] [address]  CBC with Differential     Status: Abnormal   Collection Time: 08/06/24  9:11 AM  Result Value Ref Range   WBC 8.3 4.0 - 10.5 K/uL   RBC 5.45 (H) 3.87 - 5.11 MIL/uL   Hemoglobin 13.9 12.0 - 15.0 g/dL   HCT 56.7 63.9 - 53.9 %   MCV 79.3 (L) 80.0 - 100.0 fL   MCH 25.5 (L) 26.0 - 34.0 pg   MCHC 32.2 30.0 - 36.0 g/dL   RDW  14.7 11.5 - 15.5 %   Platelets 218 150 - 400 K/uL   nRBC 0.0 0.0 - 0.2 %   Neutrophils Relative % 81 %   Neutro Abs 6.7 1.7 - 7.7 K/uL   Lymphocytes Relative 12 %   Lymphs Abs 1.0 0.7 - 4.0 K/uL   Monocytes Relative 7 %   Monocytes Absolute 0.6 0.1 - 1.0 K/uL   Eosinophils Relative 0 %   Eosinophils Absolute 0.0 0.0 - 0.5 K/uL   Basophils Relative 0 %   Basophils Absolute 0.0 0.0 - 0.1 K/uL   Immature Granulocytes 0 %   Abs Immature Granulocytes 0.02 0.00 - 0.07 K/uL    Comment: Performed at [facility], [address] [place], [place] [address]  I-Stat Lactic Acid     Status: None   Collection Time: 08/06/24  9:20 AM  Result Value Ref Range   Lactic Acid, Venous 1.5 0.5 - 1.9 mmol/L    Imaging / Studies: CT ABDOMEN PELVIS W CONTRAST Result Date: 08/06/2024 CLINICAL DATA:  Acute abdominal pain. Vomiting. Patient reports being told last night in the ER she has bowel obstruction, she wants a second opinion. EXAM: CT ABDOMEN AND PELVIS WITH CONTRAST TECHNIQUE: Multidetector CT imaging of the abdomen and pelvis was performed using the standard protocol following bolus administration of intravenous contrast. RADIATION DOSE REDUCTION: This exam was performed according to the departmental dose-optimization program which includes automated exposure control, adjustment of the mA and/or kV according to patient size and/or use of iterative reconstruction technique. CONTRAST:  OMNIPAQUE  IOHEXOL  300 MG/ML  SOLN COMPARISON:  Noncontrast CT 05/09/2021  FINDINGS: Lower chest: Heterogeneous pulmonary parenchyma. No pleural effusion or confluent opacity. 4 mm subpleural right lower lobe nodule, series 6, image 17, unchanged from 2022 and considered benign. No further imaging follow-up is needed. Hepatobiliary: The liver is enlarged spanning 21.8 cm cranial caudal with diffuse steatosis. No focal liver abnormality. Gallbladder physiologically distended, no calcified stone. No biliary dilatation. Pancreas: No ductal dilatation or inflammation. Spleen: Upper normal in size, stable from prior exam. AP dimension 14.3 cm, 12.5 cm cranial caudal. No focal splenic abnormality. Adrenals/Urinary Tract: No adrenal nodule. No hydronephrosis or perinephric edema. No renal calculi or focal lesion. Urinary bladder is physiologically distended without wall thickening. Stomach/Bowel: Dilated fluid-filled small bowel with fecalization of small bowel contents. Transition from dilated to nondilated in the right lower quadrant, series 2, image 76. The more distal small bowel is decompressed. Mild mesenteric edema adjacent to the dilated small bowel segments. No bowel pneumatosis. The appendix is normal. Moderate volume of stool in the colon. Minimal sigmoid colonic diverticulosis without diverticulitis. Vascular/Lymphatic: Aorto bi-iliac atherosclerosis. Patent portal, splenic, and mesenteric veins. No portal venous or mesenteric gas. No enlarged lymph nodes in the abdomen or pelvis. Reproductive: Bulbous uterus with calcified fibroids. No adnexal mass. Other: Minimal mesenteric free fluid but no significant abdominopelvic ascites. No free air. No abdominal wall hernia. Musculoskeletal: There are no acute or suspicious osseous abnormalities. Degenerative change in the lumbar spine and hips. IMPRESSION: 1. Small bowel obstruction with transition point in the right lower quadrant, likely due to adhesions. 2. Hepatomegaly and hepatic steatosis. 3. Minimal sigmoid colonic diverticulosis  without diverticulitis. 4. Uterine fibroids. Aortic Atherosclerosis (ICD10-I70.0). Electronically Signed   By: Andrea Gasman M.D.   On: 08/06/2024 12:28   MM 3D SCREENING MAMMOGRAM BILATERAL BREAST Result Date: 07/29/2024 CLINICAL DATA:  Screening. EXAM: DIGITAL SCREENING BILATERAL MAMMOGRAM WITH TOMOSYNTHESIS AND CAD TECHNIQUE: Bilateral screening digital craniocaudal  and mediolateral oblique mammograms were obtained. Bilateral screening digital breast tomosynthesis was performed. The images were evaluated with computer-aided detection. COMPARISON:  Previous exam(s). ACR Breast Density Category b: There are scattered areas of fibroglandular density. FINDINGS: There are no findings suspicious for malignancy. IMPRESSION: No mammographic evidence of malignancy. A result letter of this screening mammogram will be mailed directly to the patient. RECOMMENDATION: Screening mammogram in one year. (Code:[id number]-B-01Y) BI-RADS CATEGORY  1: Negative. Electronically Signed   By: Alm Parkins M.D.   On: 07/29/2024 10:31    Medications / Allergies: per chart  Antibiotics: Anti-infectives (From admission, onward)    None         Note: Portions of this report may have been transcribed using voice recognition software. Every effort was made to ensure accuracy; however, inadvertent computerized transcription errors may be present.   Any transcriptional errors that result from this process are unintentional.      08/06/2024  12:59 PM

## 2024-08-06 NOTE — ED Notes (Signed)
 Attending at bedside.

## 2024-08-06 NOTE — ED Provider Notes (Signed)
 [place] EMERGENCY DEPARTMENT AT [facility] Provider Note   CSN: [id number] Arrival date & time: 08/06/24  9255     Patient presents with: Abdominal Pain   Dorina Ribaudo is a 58 y.o. female.   58 year old female with prior medical history as detailed below presents for evaluation.  Patient was apparently seen in the ED in [facility] [place]  yesterday.  She was diagnosed with a small bowel obstruction.  She left AMA because she did not trust the opinion of the providers at that facility.  She left around 4 AM.  She presents now seeking a second opinion.  She complains of diffuse abdominal pain she reports associated nausea and vomiting.  She denies fever.  She left prior to receiving a NG tube for decompression.  She reports that she feels improved now compared to her symptoms yesterday.  The history is provided by the patient and medical records.       Prior to Admission medications   Medication Sig Start Date End Date Taking? Authorizing Provider  acetaminophen  (TYLENOL ) 500 MG tablet Take 1 tablet (500 mg total) by mouth every 8 (eight) hours as needed (pain). 04/10/16   Jerri Keys, MD  aspirin  81 MG chewable tablet Chew 1 tablet (81 mg total) by mouth daily. 04/10/16   Jerri Keys, MD  Bempedoic Acid (NEXLETOL) 180 MG TABS Take 1 tablet by mouth daily.    [provider]  FARXIGA 10 MG TABS tablet Take 10 mg by mouth daily. 03/26/24   [provider]  fish oil-omega-3 fatty acids  1000 MG capsule Take 1 g by mouth 2 (two) times daily.     [provider]  furosemide  (LASIX ) 40 MG tablet Take 40 mg by mouth as needed.    [provider]  losartan  (COZAAR ) 25 MG tablet Take 25 mg by mouth daily.    [provider]  metFORMIN (GLUCOPHAGE) 1000 MG tablet Take 1,000 mg by mouth 2 (two) times daily with a meal.      [provider]  metoprolol  tartrate (LOPRESSOR ) 100 MG tablet Take 1 tablet (100 mg total) by mouth 2  (two) times daily. PT OVERDUE FOR OV PLEASE CALL FOR APPT 07/15/18   Burnard Debby LABOR, MD  nitroGLYCERIN  (NITROSTAT ) 0.4 MG SL tablet Place 1 tablet (0.4 mg total) under the tongue every 5 (five) minutes as needed for chest pain. 04/10/16   Jerri Keys, MD  tirzepatide (MOUNJARO) 2.5 MG/0.5ML Pen Inject 2.5 mg into the skin once a week.    [provider]  Vitamin D , Ergocalciferol , (DRISDOL) 1.25 MG (50000 UNIT) CAPS capsule TAKE 1 CAPSULE BY MOUTH ONE TIME PER WEEK    [provider]    Allergies: Erythromycin, Rituxan  [rituximab ], Lovastatin, and Tolnaftate    Review of Systems  All other systems reviewed and are negative.   Updated Vital Signs BP 132/87 (BP Location: Left Arm)   Pulse (!) 113   Temp 98.4 F (36.9 C) (Oral)   Resp 16   LMP 01/20/2017   SpO2 98%   Physical Exam Vitals and nursing note reviewed.  Constitutional:      General: She is not in acute distress.    Appearance: She is well-developed.  HENT:     Head: Normocephalic and atraumatic.  Eyes:     Conjunctiva/sclera: Conjunctivae normal.  Cardiovascular:     Rate and Rhythm: Normal rate and regular rhythm.     Heart sounds: No murmur heard. Pulmonary:  Effort: Pulmonary effort is normal. No respiratory distress.     Breath sounds: Normal breath sounds.  Abdominal:     Palpations: Abdomen is soft.     Tenderness: There is no abdominal tenderness.  Musculoskeletal:        General: No swelling.     Cervical back: Neck supple.  Skin:    General: Skin is warm and dry.     Capillary Refill: Capillary refill takes less than 2 seconds.  Neurological:     Mental Status: She is alert.  Psychiatric:        Mood and Affect: Mood normal.     (all labs ordered are listed, but only abnormal results are displayed) Labs Reviewed - No data to display  EKG: None  Radiology: No results found.   Procedures   Medications Ordered in the ED - No data to display                                   Medical Decision Making Patient reports nausea, vomiting, abdominal pain that began yesterday afternoon.  She was seen in ED in [place]  last night.  She was diagnosed with a small bowel obstruction.  She left AMA.  She did not trust the staff at the facility in [place] .  She did not want to be treated at that facility.  Workup today demonstrates a small bowel obstruction.  She is advised that she will need admission for treatment.  Dr. Sheldon with general surgery is aware of case and will consult.  Hospitalist service is aware of case and evaluate for admission.  Amount and/or Complexity of Data Reviewed Labs: ordered. Radiology: ordered.  Risk Prescription drug management. Decision regarding hospitalization.        Final diagnoses:  Abdominal pain, unspecified abdominal location  Small bowel obstruction [facility])    ED Discharge Orders     None          Laurice Maude BROCKS, MD 08/06/24 1306

## 2024-08-07 ENCOUNTER — Inpatient Hospital Stay (HOSPITAL_COMMUNITY)

## 2024-08-07 DIAGNOSIS — R109 Unspecified abdominal pain: Secondary | ICD-10-CM | POA: Diagnosis not present

## 2024-08-07 DIAGNOSIS — I10 Essential (primary) hypertension: Secondary | ICD-10-CM | POA: Diagnosis not present

## 2024-08-07 DIAGNOSIS — K56609 Unspecified intestinal obstruction, unspecified as to partial versus complete obstruction: Secondary | ICD-10-CM | POA: Diagnosis not present

## 2024-08-07 DIAGNOSIS — E785 Hyperlipidemia, unspecified: Secondary | ICD-10-CM | POA: Diagnosis not present

## 2024-08-07 LAB — GLUCOSE, CAPILLARY
Glucose-Capillary: 128 mg/dL — ABNORMAL HIGH (ref 70–99)
Glucose-Capillary: 135 mg/dL — ABNORMAL HIGH (ref 70–99)
Glucose-Capillary: 138 mg/dL — ABNORMAL HIGH (ref 70–99)
Glucose-Capillary: 164 mg/dL — ABNORMAL HIGH (ref 70–99)
Glucose-Capillary: 184 mg/dL — ABNORMAL HIGH (ref 70–99)
Glucose-Capillary: 247 mg/dL — ABNORMAL HIGH (ref 70–99)

## 2024-08-07 LAB — BASIC METABOLIC PANEL WITH GFR
Anion gap: 9 (ref 5–15)
BUN: 11 mg/dL (ref 6–20)
CO2: 27 mmol/L (ref 22–32)
Calcium: 9 mg/dL (ref 8.9–10.3)
Chloride: 114 mmol/L — ABNORMAL HIGH (ref 98–111)
Creatinine, Ser: 0.65 mg/dL (ref 0.44–1.00)
GFR, Estimated: >60 mL/min (ref >60)
Glucose, Bld: 162 mg/dL — ABNORMAL HIGH (ref 70–99)
Potassium: 4.1 mmol/L (ref 3.5–5.1)
Sodium: 150 mmol/L — ABNORMAL HIGH (ref 135–145)

## 2024-08-07 LAB — HIV ANTIBODY (ROUTINE TESTING W REFLEX): HIV Screen 4th Generation wRfx: NONREACTIVE

## 2024-08-07 LAB — CBC
HCT: 40.9 % (ref 36.0–46.0)
Hemoglobin: 12.4 g/dL (ref 12.0–15.0)
MCH: 24.9 pg — ABNORMAL LOW (ref 26.0–34.0)
MCHC: 30.3 g/dL (ref 30.0–36.0)
MCV: 82.1 fL (ref 80.0–100.0)
Platelets: 197 K/uL (ref 150–400)
RBC: 4.98 MIL/uL (ref 3.87–5.11)
RDW: 14.9 % (ref 11.5–15.5)
WBC: 5.1 K/uL (ref 4.0–10.5)
nRBC: 0 % (ref 0.0–0.2)

## 2024-08-07 LAB — MAGNESIUM: Magnesium: 2.6 mg/dL — ABNORMAL HIGH (ref 1.7–2.4)

## 2024-08-07 NOTE — Progress Notes (Signed)
 [facility]                                                                              Desiree [name], is a 58 y.o. female, DOB - [birth year], FMW:[id number] Admit date - 08/06/2024    Outpatient Primary MD for the patient is [place], [place], [place]  LOS - 1  days  Chief Complaint  Patient presents with   Abdominal Pain       Brief summary   Patient is a 58 y.o. female with non-Hodgkin lymphoma, follows Dr. Lonn, hypertension, hyperlipidemia, chronic anemia, diabetes mellitus, palpitations, presented to ED with abdominal pain, nausea and vomiting.  Patient reported that her symptoms started a day before the admission around noon with intractable nausea vomiting and unable to keep anything down.  She also had lower abdominal pain 10/10, sharp and intermittent.  She went to the local ER in [place]  where she works and she was told there was a concern for small bowel obstruction.   She lives in [place] and has all her doctors here hence came down to [facility], ED. at the time of admission, still had diffuse abdominal pain, last vomiting episode was 2 AM in the morning.  Patient was admitted for further workup.   Assessment & Plan     SBO (small bowel obstruction) (HCC) -No prior history of SBO or abdominal surgeries.  Symptoms abruptly started around noon yesterday with multiple episodes of nausea and vomiting, abdominal pain, unable to hold anything down - CT abdomen confirmed SBO with transition point in the right lower quadrant - General Surgery consulted, continue n.p.o., NG tube to intermittent wall suction, pain control, antiemetics, IV fluids - No BM yet, feels better today     Hypertension, tachycardia -known history of tachycardia and palpitation, on metoprolol  100 mg twice daily - Continue IV Lopressor  5 mg every 8 hours -Hold losartan , Lasix .     Type 2 diabetes mellitus with complication, without long-term current use of insulin   (HCC), uncontrolled -Hold Mounjaro, metformin, Farxiga - Hemoglobin A1c 9.1  - Continue moderate SSI every 4 hours   CBG (last 3)  Recent Labs    08/07/24 0455 08/07/24 0709 08/07/24 1114  GLUCAP 164* 135* 128*        Hyperlipidemia, CAD - Hold aspirin , continue IV Lopressor    Obesity class II Estimated body mass index is 39.56 kg/m as calculated from the following:   Height as of this encounter: 5' 7.5 (1.715 m).   Weight as of this encounter: 116.3 kg.  Code Status: Full code DVT Prophylaxis:  SCDs Start: 08/06/24 1708   Level of Care: Level of care: Telemetry Family Communication: Updated patient's sister at the bedside on 10/25 Disposition Plan:      Remains inpatient appropriate:      Procedures:    Consultants:   Surgery  Antimicrobials:   Anti-infectives (From admission, onward)    None          Medications  bisacodyl  10 mg Rectal Daily   diatrizoate meglumine-sodium  90 mL Per NG tube Once   insulin  aspart  0-15 Units Subcutaneous Q4H  metoprolol  tartrate  5 mg Intravenous Q8H      Subjective:   Desiree [name] was seen and examined today.  No BM yet, slightly feeling better today.  On NG tube.  No acute chest pain, shortness of breath, fevers or chills.  No active vomiting.   Objective:   Vitals:   08/06/24 1646 08/06/24 2010 08/07/24 0038 08/07/24 0453  BP: 125/69 129/66 123/73 120/61  Pulse: (!) 103 99 91 98  Resp: 16 16 16 16   Temp: 98.4 F (36.9 C) 97.7 F (36.5 C) 97.6 F (36.4 C) 98 F (36.7 C)  TempSrc: Oral Oral Oral Oral  SpO2: 100% 91%  94%  Weight:      Height:        Intake/Output Summary (Last 24 hours) at 08/07/2024 1145 Last data filed at 08/07/2024 0931 Gross per 24 hour  Intake 1446.09 ml  Output 1500 ml  Net -53.91 ml     Wt Readings from Last 3 Encounters:  08/06/24 116.3 kg  05/17/24 115.3 kg  11/17/23 111.6 kg    Physical Exam General: Alert and oriented x 3, NAD Cardiovascular: S1  S2 clear, RRR.  Respiratory: CTAB, no wheezing Gastrointestinal: Soft, NT, ND , hypoactive BS Ext: no pedal edema bilaterally Neuro: no new deficits Psych: Normal affect    Data Reviewed:  I have personally reviewed following labs    CBC Lab Results  Component Value Date   WBC 5.1 08/07/2024   RBC 4.98 08/07/2024   HGB 12.4 08/07/2024   HCT 40.9 08/07/2024   MCV 82.1 08/07/2024   MCH 24.9 (L) 08/07/2024   PLT 197 08/07/2024   MCHC 30.3 08/07/2024   RDW 14.9 08/07/2024   LYMPHSABS 1.0 08/06/2024   MONOABS 0.6 08/06/2024   EOSABS 0.0 08/06/2024   BASOSABS 0.0 08/06/2024     Last metabolic panel Lab Results  Component Value Date   NA 150 (H) 08/07/2024   K 4.1 08/07/2024   CL 114 (H) 08/07/2024   CO2 27 08/07/2024   BUN 11 08/07/2024   CREATININE 0.65 08/07/2024   GLUCOSE 162 (H) 08/07/2024   GFRNONAA >60 08/07/2024   GFRAA >60 05/18/2020   CALCIUM  9.0 08/07/2024   PROT 6.9 08/06/2024   ALBUMIN 4.3 08/06/2024   BILITOT 0.6 08/06/2024   ALKPHOS 89 08/06/2024   AST 20 08/06/2024   ALT 26 08/06/2024   ANIONGAP 9 08/07/2024    CBG (last 3)  Recent Labs    08/07/24 0455 08/07/24 0709 08/07/24 1114  GLUCAP 164* 135* 128*      Coagulation Profile: No results for input(s): INR, PROTIME in the last 168 hours.   Radiology Studies: I have personally reviewed the imaging studies  DG Abd Portable 1V Result Date: 08/06/2024 CLINICAL DATA:  Small bowel obstruction.  NG tube placement. EXAM: PORTABLE ABDOMEN - 1 VIEW COMPARISON:  Earlier today FINDINGS: Tip and side port of the enteric tube below the diaphragm in the stomach. Small bowel distension on exams earlier today is not included in the field of view. IMPRESSION: Tip and side port of the enteric tube below the diaphragm in the stomach. Electronically Signed   By: Andrea Gasman M.D.   On: 08/06/2024 18:33   DG Abd Portable 1V-Small Bowel Protocol-Position Verification Result Date: 08/06/2024 CLINICAL  DATA:  Nasogastric tube placement. Abdominal pain and vomiting. EXAM: PORTABLE ABDOMEN - 1 VIEW COMPARISON:  CT earlier today FINDINGS: Imaging obtained of the abdomen and pelvis. Nasogastric tube is not seen on provided  views. Gaseous small bowel distension corresponding to small-bowel obstruction on earlier CT. Excreted IV contrast in the renal collecting systems and bladder. IMPRESSION: 1. Nasogastric tube is not seen on provided views. Consider chest radiograph. 2. Gaseous small bowel distension corresponding to small-bowel obstruction on earlier CT. Electronically Signed   By: Andrea Gasman M.D.   On: 08/06/2024 14:03   CT ABDOMEN PELVIS W CONTRAST Result Date: 08/06/2024 CLINICAL DATA:  Acute abdominal pain. Vomiting. Patient reports being told last night in the ER she has bowel obstruction, she wants a second opinion. EXAM: CT ABDOMEN AND PELVIS WITH CONTRAST TECHNIQUE: Multidetector CT imaging of the abdomen and pelvis was performed using the standard protocol following bolus administration of intravenous contrast. RADIATION DOSE REDUCTION: This exam was performed according to the departmental dose-optimization program which includes automated exposure control, adjustment of the mA and/or kV according to patient size and/or use of iterative reconstruction technique. CONTRAST:  OMNIPAQUE  IOHEXOL  300 MG/ML  SOLN COMPARISON:  Noncontrast CT 05/09/2021 FINDINGS: Lower chest: Heterogeneous pulmonary parenchyma. No pleural effusion or confluent opacity. 4 mm subpleural right lower lobe nodule, series 6, image 17, unchanged from 2022 and considered benign. No further imaging follow-up is needed. Hepatobiliary: The liver is enlarged spanning 21.8 cm cranial caudal with diffuse steatosis. No focal liver abnormality. Gallbladder physiologically distended, no calcified stone. No biliary dilatation. Pancreas: No ductal dilatation or inflammation. Spleen: Upper normal in size, stable from prior exam. AP  dimension 14.3 cm, 12.5 cm cranial caudal. No focal splenic abnormality. Adrenals/Urinary Tract: No adrenal nodule. No hydronephrosis or perinephric edema. No renal calculi or focal lesion. Urinary bladder is physiologically distended without wall thickening. Stomach/Bowel: Dilated fluid-filled small bowel with fecalization of small bowel contents. Transition from dilated to nondilated in the right lower quadrant, series 2, image 76. The more distal small bowel is decompressed. Mild mesenteric edema adjacent to the dilated small bowel segments. No bowel pneumatosis. The appendix is normal. Moderate volume of stool in the colon. Minimal sigmoid colonic diverticulosis without diverticulitis. Vascular/Lymphatic: Aorto bi-iliac atherosclerosis. Patent portal, splenic, and mesenteric veins. No portal venous or mesenteric gas. No enlarged lymph nodes in the abdomen or pelvis. Reproductive: Bulbous uterus with calcified fibroids. No adnexal mass. Other: Minimal mesenteric free fluid but no significant abdominopelvic ascites. No free air. No abdominal wall hernia. Musculoskeletal: There are no acute or suspicious osseous abnormalities. Degenerative change in the lumbar spine and hips. IMPRESSION: 1. Small bowel obstruction with transition point in the right lower quadrant, likely due to adhesions. 2. Hepatomegaly and hepatic steatosis. 3. Minimal sigmoid colonic diverticulosis without diverticulitis. 4. Uterine fibroids. Aortic Atherosclerosis (ICD10-I70.0). Electronically Signed   By: Andrea Gasman M.D.   On: 08/06/2024 12:28       Joyleen Haselton M.D. [facility] 08/07/2024, 11:45 AM  Available via Epic secure chat 7am-7pm After 7 pm, please refer to night coverage provider listed on amion.

## 2024-08-07 NOTE — Plan of Care (Signed)
  Problem: Education: Goal: Ability to describe self-care measures that may prevent or decrease complications (Diabetes Survival Skills Education) will improve Outcome: Progressing   Problem: Coping: Goal: Ability to adjust to condition or change in health will improve Outcome: Progressing   Problem: Fluid Volume: Goal: Ability to maintain a balanced intake and output will improve Outcome: Progressing   Problem: Health Behavior/Discharge Planning: Goal: Ability to identify and utilize available resources and services will improve Outcome: Progressing Goal: Ability to manage health-related needs will improve Outcome: Progressing   Problem: Metabolic: Goal: Ability to maintain appropriate glucose levels will improve Outcome: Progressing   Problem: Nutritional: Goal: Progress toward achieving an optimal weight will improve Outcome: Progressing   Problem: Education: Goal: Knowledge of General Education information will improve Description: Including pain rating scale, medication(s)/side effects and non-pharmacologic comfort measures Outcome: Progressing   Problem: Health Behavior/Discharge Planning: Goal: Ability to manage health-related needs will improve Outcome: Progressing   Problem: Clinical Measurements: Goal: Ability to maintain clinical measurements within normal limits will improve Outcome: Progressing Goal: Will remain free from infection Outcome: Progressing Goal: Diagnostic test results will improve Outcome: Progressing Goal: Respiratory complications will improve Outcome: Progressing   Problem: Activity: Goal: Risk for activity intolerance will decrease Outcome: Progressing   Problem: Coping: Goal: Level of anxiety will decrease Outcome: Progressing   Problem: Elimination: Goal: Will not experience complications related to bowel motility Outcome: Progressing   Problem: Pain Managment: Goal: General experience of comfort will improve and/or be  controlled Outcome: Progressing   Problem: Safety: Goal: Ability to remain free from injury will improve Outcome: Progressing

## 2024-08-07 NOTE — Plan of Care (Signed)

## 2024-08-07 NOTE — Progress Notes (Signed)
 Subjective/Chief Complaint: No complaints. Feels better   Objective: Vital signs in last 24 hours: Temp:  [97.6 F (36.4 C)-98.8 F (37.1 C)] 98 F (36.7 C) (10/26 0453) Pulse Rate:  [91-104] 98 (10/26 0453) Resp:  [16] 16 (10/26 0453) BP: (116-129)/(61-73) 120/61 (10/26 0453) SpO2:  [91 %-100 %] 94 % (10/26 0453) Weight:  [116.3 kg] 116.3 kg (10/25 1645) Last BM Date : 08/05/24  Intake/Output from previous day: 10/25 0701 - 10/26 0700 In: 1326.1 [I.V.:1166.1; NG/GT:160] Out: 1500 [Urine:1100; Emesis/NG output:400] Intake/Output this shift: No intake/output data recorded.  General appearance: alert and cooperative Resp: clear to auscultation bilaterally Cardio: regular rate and rhythm GI: soft, nontender  Lab Results:  Recent Labs    08/06/24 0911 08/07/24 0702  WBC 8.3 5.1  HGB 13.9 12.4  HCT 43.2 40.9  PLT 218 197   BMET Recent Labs    08/06/24 0911 08/07/24 0702  NA 142 150*  K 4.8 4.1  CL 104 114*  CO2 24 27  GLUCOSE 340* 162*  BUN 17 11  CREATININE 0.85 0.65  CALCIUM  9.6 9.0   PT/INR No results for input(s): LABPROT, INR in the last 72 hours. ABG No results for input(s): PHART, HCO3 in the last 72 hours.  Invalid input(s): PCO2, PO2  Studies/Results: DG Abd Portable 1V Result Date: 08/06/2024 CLINICAL DATA:  Small bowel obstruction.  NG tube placement. EXAM: PORTABLE ABDOMEN - 1 VIEW COMPARISON:  Earlier today FINDINGS: Tip and side port of the enteric tube below the diaphragm in the stomach. Small bowel distension on exams earlier today is not included in the field of view. IMPRESSION: Tip and side port of the enteric tube below the diaphragm in the stomach. Electronically Signed   By: Andrea Gasman M.D.   On: 08/06/2024 18:33   DG Abd Portable 1V-Small Bowel Protocol-Position Verification Result Date: 08/06/2024 CLINICAL DATA:  Nasogastric tube placement. Abdominal pain and vomiting. EXAM: PORTABLE ABDOMEN - 1 VIEW  COMPARISON:  CT earlier today FINDINGS: Imaging obtained of the abdomen and pelvis. Nasogastric tube is not seen on provided views. Gaseous small bowel distension corresponding to small-bowel obstruction on earlier CT. Excreted IV contrast in the renal collecting systems and bladder. IMPRESSION: 1. Nasogastric tube is not seen on provided views. Consider chest radiograph. 2. Gaseous small bowel distension corresponding to small-bowel obstruction on earlier CT. Electronically Signed   By: Andrea Gasman M.D.   On: 08/06/2024 14:03   CT ABDOMEN PELVIS W CONTRAST Result Date: 08/06/2024 CLINICAL DATA:  Acute abdominal pain. Vomiting. Patient reports being told last night in the ER she has bowel obstruction, she wants a second opinion. EXAM: CT ABDOMEN AND PELVIS WITH CONTRAST TECHNIQUE: Multidetector CT imaging of the abdomen and pelvis was performed using the standard protocol following bolus administration of intravenous contrast. RADIATION DOSE REDUCTION: This exam was performed according to the departmental dose-optimization program which includes automated exposure control, adjustment of the mA and/or kV according to patient size and/or use of iterative reconstruction technique. CONTRAST:  OMNIPAQUE  IOHEXOL  300 MG/ML  SOLN COMPARISON:  Noncontrast CT 05/09/2021 FINDINGS: Lower chest: Heterogeneous pulmonary parenchyma. No pleural effusion or confluent opacity. 4 mm subpleural right lower lobe nodule, series 6, image 17, unchanged from 2022 and considered benign. No further imaging follow-up is needed. Hepatobiliary: The liver is enlarged spanning 21.8 cm cranial caudal with diffuse steatosis. No focal liver abnormality. Gallbladder physiologically distended, no calcified stone. No biliary dilatation. Pancreas: No ductal dilatation or inflammation. Spleen: Upper normal in  size, stable from prior exam. AP dimension 14.3 cm, 12.5 cm cranial caudal. No focal splenic abnormality. Adrenals/Urinary Tract: No  adrenal nodule. No hydronephrosis or perinephric edema. No renal calculi or focal lesion. Urinary bladder is physiologically distended without wall thickening. Stomach/Bowel: Dilated fluid-filled small bowel with fecalization of small bowel contents. Transition from dilated to nondilated in the right lower quadrant, series 2, image 76. The more distal small bowel is decompressed. Mild mesenteric edema adjacent to the dilated small bowel segments. No bowel pneumatosis. The appendix is normal. Moderate volume of stool in the colon. Minimal sigmoid colonic diverticulosis without diverticulitis. Vascular/Lymphatic: Aorto bi-iliac atherosclerosis. Patent portal, splenic, and mesenteric veins. No portal venous or mesenteric gas. No enlarged lymph nodes in the abdomen or pelvis. Reproductive: Bulbous uterus with calcified fibroids. No adnexal mass. Other: Minimal mesenteric free fluid but no significant abdominopelvic ascites. No free air. No abdominal wall hernia. Musculoskeletal: There are no acute or suspicious osseous abnormalities. Degenerative change in the lumbar spine and hips. IMPRESSION: 1. Small bowel obstruction with transition point in the right lower quadrant, likely due to adhesions. 2. Hepatomegaly and hepatic steatosis. 3. Minimal sigmoid colonic diverticulosis without diverticulitis. 4. Uterine fibroids. Aortic Atherosclerosis (ICD10-I70.0). Electronically Signed   By: Andrea Gasman M.D.   On: 08/06/2024 12:28    Anti-infectives: Anti-infectives (From admission, onward)    None       Assessment/Plan: s/p * No surgery found * sbo Continue ng and bowel rest Small bowel protocol Will follow  LOS: 1 day    Deward Null III 08/07/2024

## 2024-08-08 ENCOUNTER — Inpatient Hospital Stay (HOSPITAL_COMMUNITY)

## 2024-08-08 DIAGNOSIS — R000 Tachycardia, unspecified: Secondary | ICD-10-CM | POA: Diagnosis not present

## 2024-08-08 DIAGNOSIS — R109 Unspecified abdominal pain: Secondary | ICD-10-CM | POA: Diagnosis not present

## 2024-08-08 DIAGNOSIS — E785 Hyperlipidemia, unspecified: Secondary | ICD-10-CM | POA: Diagnosis not present

## 2024-08-08 DIAGNOSIS — K56609 Unspecified intestinal obstruction, unspecified as to partial versus complete obstruction: Secondary | ICD-10-CM | POA: Diagnosis not present

## 2024-08-08 LAB — GLUCOSE, CAPILLARY
Glucose-Capillary: 110 mg/dL — ABNORMAL HIGH (ref 70–99)
Glucose-Capillary: 135 mg/dL — ABNORMAL HIGH (ref 70–99)
Glucose-Capillary: 146 mg/dL — ABNORMAL HIGH (ref 70–99)
Glucose-Capillary: 149 mg/dL — ABNORMAL HIGH (ref 70–99)
Glucose-Capillary: 152 mg/dL — ABNORMAL HIGH (ref 70–99)
Glucose-Capillary: 171 mg/dL — ABNORMAL HIGH (ref 70–99)

## 2024-08-08 LAB — CBC
HCT: 41.6 % (ref 36.0–46.0)
Hemoglobin: 12.7 g/dL (ref 12.0–15.0)
MCH: 25.6 pg — ABNORMAL LOW (ref 26.0–34.0)
MCHC: 30.5 g/dL (ref 30.0–36.0)
MCV: 83.7 fL (ref 80.0–100.0)
Platelets: 187 K/uL (ref 150–400)
RBC: 4.97 MIL/uL (ref 3.87–5.11)
RDW: 14.9 % (ref 11.5–15.5)
WBC: 6.9 K/uL (ref 4.0–10.5)
nRBC: 0 % (ref 0.0–0.2)

## 2024-08-08 LAB — MAGNESIUM: Magnesium: 2.2 mg/dL (ref 1.7–2.4)

## 2024-08-08 LAB — RENAL FUNCTION PANEL
Albumin: 3.9 g/dL (ref 3.5–5.0)
Anion gap: 13 (ref 5–15)
BUN: 7 mg/dL (ref 6–20)
CO2: 26 mmol/L (ref 22–32)
Calcium: 9 mg/dL (ref 8.9–10.3)
Chloride: 112 mmol/L — ABNORMAL HIGH (ref 98–111)
Creatinine, Ser: 0.64 mg/dL (ref 0.44–1.00)
GFR, Estimated: >60 mL/min (ref >60)
Glucose, Bld: 117 mg/dL — ABNORMAL HIGH (ref 70–99)
Phosphorus: 2.6 mg/dL (ref 2.5–4.6)
Potassium: 3.8 mmol/L (ref 3.5–5.1)
Sodium: 151 mmol/L — ABNORMAL HIGH (ref 135–145)

## 2024-08-08 LAB — BASIC METABOLIC PANEL WITH GFR
Anion gap: 11 (ref 5–15)
BUN: 7 mg/dL (ref 6–20)
CO2: 27 mmol/L (ref 22–32)
Calcium: 9.1 mg/dL (ref 8.9–10.3)
Chloride: 112 mmol/L — ABNORMAL HIGH (ref 98–111)
Creatinine, Ser: 0.64 mg/dL (ref 0.44–1.00)
GFR, Estimated: >60 mL/min (ref >60)
Glucose, Bld: 120 mg/dL — ABNORMAL HIGH (ref 70–99)
Potassium: 3.8 mmol/L (ref 3.5–5.1)
Sodium: 150 mmol/L — ABNORMAL HIGH (ref 135–145)

## 2024-08-08 MED ORDER — DIATRIZOATE MEGLUMINE & SODIUM 66-10 % PO SOLN
90.0000 mL | Freq: Once | ORAL | Status: AC
Start: 2024-08-08 — End: 2024-08-08
  Administered 2024-08-08: 90 mL via NASOGASTRIC
  Filled 2024-08-08: qty 90

## 2024-08-08 NOTE — Progress Notes (Signed)
 Called to room by pt's sister. Pt franctic, requesting NG tube to be pulled. Cheked placement noted to have come out to 40 cm mark. Gently re-advanced to 55 cm, re-secured and KUB order received for placement. Pt voiced relief after advancement. Meds given as ordered for pain and nausea. NP to bedside to assess and educate pt/family.

## 2024-08-08 NOTE — Progress Notes (Signed)
 [facility]                                                                              Desiree [name], is a 58 y.o. female, DOB - 09-26-1966, FMW:[id number] Admit date - 08/06/2024    Outpatient Primary MD for the patient is Redmon, Bruce, PA  LOS - 2  days  Chief Complaint  Patient presents with   Abdominal Pain       Brief summary   Patient is a 58 y.o. female with non-Hodgkin lymphoma, follows Dr. Lonn, hypertension, hyperlipidemia, chronic anemia, diabetes mellitus, palpitations, presented to ED with abdominal pain, nausea and vomiting.  Patient reported that her symptoms started a day before the admission around noon with intractable nausea vomiting and unable to keep anything down.  She also had lower abdominal pain 10/10, sharp and intermittent.  She went to the local ER in [place]  where she works and she was told there was a concern for small bowel obstruction.   She lives in [place] and has all her doctors here hence came down to [facility], ED. at the time of admission, still had diffuse abdominal pain, last vomiting episode was 2 AM in the morning.  Patient was admitted for further workup.   Assessment & Plan     SBO (small bowel obstruction) (HCC) -No prior history of SBO or abdominal surgeries.  Symptoms abruptly started around noon yesterday with multiple episodes of nausea and vomiting, abdominal pain, unable to hold anything down - CT abdomen confirmed SBO with transition point in the right lower quadrant - Surgery following, continue n.p.o., NGT, IV fluids, SBO protocol     Hypertension, tachycardia -known history of tachycardia and palpitation, on metoprolol  100 mg twice daily - Continue IV Lopressor  5 mg q8hrs, will change to every 6 hours if needed/tachycardia -Hold losartan , Lasix .     Type 2 diabetes mellitus with complication, without long-term current use of insulin  (HCC), uncontrolled -Hold Mounjaro,  metformin, Farxiga - Hemoglobin A1c 9.1    CBG (last 3)  Recent Labs    08/08/24 0422 08/08/24 0759 08/08/24 1125  GLUCAP 110* 135* 149*  - Continue moderate SSI      Hyperlipidemia, CAD - Hold aspirin , continue IV Lopressor    Obesity class II Estimated body mass index is 39.56 kg/m as calculated from the following:   Height as of this encounter: 5' 7.5 (1.715 m).   Weight as of this encounter: 116.3 kg.  Code Status: Full code DVT Prophylaxis:  SCDs Start: 08/06/24 1708   Level of Care: Level of care: Telemetry Family Communication: Updated patient's sister at the bedside today Disposition Plan:      Remains inpatient appropriate:      Procedures:    Consultants:   Surgery  Antimicrobials:   Anti-infectives (From admission, onward)    None          Medications  bisacodyl  10 mg Rectal Daily   insulin  aspart  0-15 Units Subcutaneous Q4H   metoprolol  tartrate  5 mg Intravenous Q8H      Subjective:   Desiree [name] was seen and examined today.  Still  no BM yet, no acute nausea vomiting. + Abdominal pain, passing some flatus. has NGT with output.   Objective:   Vitals:   08/07/24 1359 08/07/24 2002 08/08/24 0330 08/08/24 0649  BP: 134/81 (!) 152/82 136/80 122/80  Pulse: 96 (!) 101 (!) 105 98  Resp: 18 18  18   Temp: 97.8 F (36.6 C) 98.3 F (36.8 C) 98.2 F (36.8 C) 98.3 F (36.8 C)  TempSrc: Oral  Oral   SpO2: 97% 98% 92% 95%  Weight:      Height:        Intake/Output Summary (Last 24 hours) at 08/08/2024 1306 Last data filed at 08/08/2024 0400 Gross per 24 hour  Intake 190 ml  Output 1600 ml  Net -1410 ml     Wt Readings from Last 3 Encounters:  08/06/24 116.3 kg  05/17/24 115.3 kg  11/17/23 111.6 kg   Physical Exam General: Alert and oriented x 3, NAD, NGT, uncomfortable Cardiovascular: S1 S2 clear, RRR.  Respiratory: CTAB, no wheezing, rales or rhonchi Gastrointestinal: Soft, NT, ND, hypoactive BS Ext: no pedal  edema bilaterally Neuro: no new deficits Psych: Normal affect   Data Reviewed:  I have personally reviewed following labs    CBC Lab Results  Component Value Date   WBC 6.9 08/08/2024   RBC 4.97 08/08/2024   HGB 12.7 08/08/2024   HCT 41.6 08/08/2024   MCV 83.7 08/08/2024   MCH 25.6 (L) 08/08/2024   PLT 187 08/08/2024   MCHC 30.5 08/08/2024   RDW 14.9 08/08/2024   LYMPHSABS 1.0 08/06/2024   MONOABS 0.6 08/06/2024   EOSABS 0.0 08/06/2024   BASOSABS 0.0 08/06/2024     Last metabolic panel Lab Results  Component Value Date   NA 151 (H) 08/08/2024   NA 150 (H) 08/08/2024   K 3.8 08/08/2024   K 3.8 08/08/2024   CL 112 (H) 08/08/2024   CL 112 (H) 08/08/2024   CO2 26 08/08/2024   CO2 27 08/08/2024   BUN 7 08/08/2024   BUN 7 08/08/2024   CREATININE 0.64 08/08/2024   CREATININE 0.64 08/08/2024   GLUCOSE 117 (H) 08/08/2024   GLUCOSE 120 (H) 08/08/2024   GFRNONAA >60 08/08/2024   GFRNONAA >60 08/08/2024   GFRAA >60 05/18/2020   CALCIUM  9.0 08/08/2024   CALCIUM  9.1 08/08/2024   PHOS 2.6 08/08/2024   PROT 6.9 08/06/2024   ALBUMIN 3.9 08/08/2024   BILITOT 0.6 08/06/2024   ALKPHOS 89 08/06/2024   AST 20 08/06/2024   ALT 26 08/06/2024   ANIONGAP 13 08/08/2024   ANIONGAP 11 08/08/2024    CBG (last 3)  Recent Labs    08/08/24 0422 08/08/24 0759 08/08/24 1125  GLUCAP 110* 135* 149*      Coagulation Profile: No results for input(s): INR, PROTIME in the last 168 hours.   Radiology Studies: I have personally reviewed the imaging studies  DG Abd 1 View Result Date: 08/08/2024 EXAM: 1 VIEW XRAY OF THE ABDOMEN 08/08/2024 02:36:00 AM COMPARISON: 08/07/2024 CLINICAL HISTORY: Encounter for imaging study to confirm nasogastric (NG) tube placement FINDINGS: BOWEL: Nonobstructive bowel gas pattern. SOFT TISSUES: Enteric tube in place with distal tip terminating within the proximal stomach. Side port in distal esophagus. BONES: No acute osseous abnormality. IMPRESSION:  1. Enteric tube in place with distal tip terminating within the proximal stomach and side port in distal esophagus. Advancement of the catheter by 10 cm would more optimally positioned device. Electronically signed by: Dorethia Molt MD 08/08/2024 02:40 AM EDT RP  Workstation: [id number]   DG Abd 2 Views Result Date: 08/07/2024 EXAM: 2 VIEW XRAY OF THE ABDOMEN 08/07/2024 10:28:00 AM COMPARISON: 08/06/2024 CLINICAL HISTORY: FINDINGS: LINES, TUBES AND DEVICES: Enteric tube in place with tip and side port projecting over the gastric fundus. BOWEL: Nonobstructive bowel gas pattern. Interval resolution of previous central small bowel dilatation. SOFT TISSUES: No opaque urinary calculi. BONES: No acute osseous abnormality. IMPRESSION: 1. Interval resolution of previous central small bowel dilatation. Electronically signed by: Waddell Calk MD 08/07/2024 12:55 PM EDT RP Workstation: [id number]   DG Abd Portable 1V Result Date: 08/06/2024 CLINICAL DATA:  Small bowel obstruction.  NG tube placement. EXAM: PORTABLE ABDOMEN - 1 VIEW COMPARISON:  Earlier today FINDINGS: Tip and side port of the enteric tube below the diaphragm in the stomach. Small bowel distension on exams earlier today is not included in the field of view. IMPRESSION: Tip and side port of the enteric tube below the diaphragm in the stomach. Electronically Signed   By: Andrea Gasman M.D.   On: 08/06/2024 18:33   DG Abd Portable 1V-Small Bowel Protocol-Position Verification Result Date: 08/06/2024 CLINICAL DATA:  Nasogastric tube placement. Abdominal pain and vomiting. EXAM: PORTABLE ABDOMEN - 1 VIEW COMPARISON:  CT earlier today FINDINGS: Imaging obtained of the abdomen and pelvis. Nasogastric tube is not seen on provided views. Gaseous small bowel distension corresponding to small-bowel obstruction on earlier CT. Excreted IV contrast in the renal collecting systems and bladder. IMPRESSION: 1. Nasogastric tube is not seen on provided views.  Consider chest radiograph. 2. Gaseous small bowel distension corresponding to small-bowel obstruction on earlier CT. Electronically Signed   By: Andrea Gasman M.D.   On: 08/06/2024 14:03       Joeanne Robicheaux M.D. [facility] 08/08/2024, 1:06 PM  Available via Epic secure chat 7am-7pm After 7 pm, please refer to night coverage provider listed on amion.

## 2024-08-08 NOTE — Progress Notes (Signed)
 Progress Note     Subjective: Pt denies abdominal pain and reports she is passing some flatus. NGT repositioned overnight. Does not appear she was given gastrografin.   Objective: Vital signs in last 24 hours: Temp:  [97.8 F (36.6 C)-98.3 F (36.8 C)] 98.3 F (36.8 C) (10/27 0649) Pulse Rate:  [96-105] 98 (10/27 0649) Resp:  [18] 18 (10/27 0649) BP: (122-152)/(80-82) 122/80 (10/27 0649) SpO2:  [92 %-98 %] 95 % (10/27 0649) Last BM Date : 08/05/24  Intake/Output from previous day: 10/26 0701 - 10/27 0700 In: 280 [P.O.:190; NG/GT:90] Out: 1600 [Urine:900; Emesis/NG output:700] Intake/Output this shift: No intake/output data recorded.  PE: General: pleasant, WD, obese female who is laying in bed in NAD Heart: regular, rate, and rhythm.   Lungs: Respiratory effort nonlabored Abd: soft, NT, ND, NGT with thin tan fluid  Psych: A&Ox3 with an appropriate affect.    Lab Results:  Recent Labs    08/07/24 0702 08/08/24 0612  WBC 5.1 6.9  HGB 12.4 12.7  HCT 40.9 41.6  PLT 197 187   BMET Recent Labs    08/07/24 0702 08/08/24 0612  NA 150* 151*  150*  K 4.1 3.8  3.8  CL 114* 112*  112*  CO2 27 26  27   GLUCOSE 162* 117*  120*  BUN 11 7  7   CREATININE 0.65 0.64  0.64  CALCIUM  9.0 9.0  9.1   PT/INR No results for input(s): LABPROT, INR in the last 72 hours. CMP     Component Value Date/Time   NA 151 (H) 08/08/2024 0612   NA 150 (H) 08/08/2024 0612   NA 138 07/24/2017 1105   K 3.8 08/08/2024 0612   K 3.8 08/08/2024 0612   K 4.2 07/24/2017 1105   CL 112 (H) 08/08/2024 0612   CL 112 (H) 08/08/2024 0612   CL 106 11/19/2012 0837   CO2 26 08/08/2024 0612   CO2 27 08/08/2024 0612   CO2 26 07/24/2017 1105   GLUCOSE 117 (H) 08/08/2024 0612   GLUCOSE 120 (H) 08/08/2024 0612   GLUCOSE 362 (H) 07/24/2017 1105   GLUCOSE 107 (H) 11/19/2012 0837   BUN 7 08/08/2024 0612   BUN 7 08/08/2024 0612   BUN 10.0 07/24/2017 1105   CREATININE 0.64 08/08/2024  0612   CREATININE 0.64 08/08/2024 0612   CREATININE 0.82 08/08/2022 0801   CREATININE 0.8 07/24/2017 1105   CALCIUM  9.0 08/08/2024 0612   CALCIUM  9.1 08/08/2024 0612   CALCIUM  8.8 07/24/2017 1105   PROT 6.9 08/06/2024 0911   PROT 6.3 (L) 07/24/2017 1105   ALBUMIN 3.9 08/08/2024 0612   ALBUMIN 3.4 (L) 07/24/2017 1105   AST 20 08/06/2024 0911   AST 12 (L) 08/08/2022 0801   AST 13 07/24/2017 1105   ALT 26 08/06/2024 0911   ALT 13 08/08/2022 0801   ALT 21 07/24/2017 1105   ALKPHOS 89 08/06/2024 0911   ALKPHOS 112 07/24/2017 1105   BILITOT 0.6 08/06/2024 0911   BILITOT 0.6 08/08/2022 0801   BILITOT 0.55 07/24/2017 1105   GFRNONAA >60 08/08/2024 0612   GFRNONAA >60 08/08/2024 0612   GFRNONAA >60 08/08/2022 0801   GFRAA >60 05/18/2020 0825   GFRAA >60 10/22/2018 0809   Lipase     Component Value Date/Time   LIPASE 21 08/06/2024 0911       Studies/Results: DG Abd 1 View Result Date: 08/08/2024 EXAM: 1 VIEW XRAY OF THE ABDOMEN 08/08/2024 02:36:00 AM COMPARISON: 08/07/2024 CLINICAL HISTORY: Encounter for imaging  study to confirm nasogastric (NG) tube placement FINDINGS: BOWEL: Nonobstructive bowel gas pattern. SOFT TISSUES: Enteric tube in place with distal tip terminating within the proximal stomach. Side port in distal esophagus. BONES: No acute osseous abnormality. IMPRESSION: 1. Enteric tube in place with distal tip terminating within the proximal stomach and side port in distal esophagus. Advancement of the catheter by 10 cm would more optimally positioned device. Electronically signed by: Dorethia Molt MD 08/08/2024 02:40 AM EDT RP Workstation: [id number]   DG Abd 2 Views Result Date: 08/07/2024 EXAM: 2 VIEW XRAY OF THE ABDOMEN 08/07/2024 10:28:00 AM COMPARISON: 08/06/2024 CLINICAL HISTORY: FINDINGS: LINES, TUBES AND DEVICES: Enteric tube in place with tip and side port projecting over the gastric fundus. BOWEL: Nonobstructive bowel gas pattern. Interval resolution of previous  central small bowel dilatation. SOFT TISSUES: No opaque urinary calculi. BONES: No acute osseous abnormality. IMPRESSION: 1. Interval resolution of previous central small bowel dilatation. Electronically signed by: Waddell Calk MD 08/07/2024 12:55 PM EDT RP Workstation: [id number]   DG Abd Portable 1V Result Date: 08/06/2024 CLINICAL DATA:  Small bowel obstruction.  NG tube placement. EXAM: PORTABLE ABDOMEN - 1 VIEW COMPARISON:  Earlier today FINDINGS: Tip and side port of the enteric tube below the diaphragm in the stomach. Small bowel distension on exams earlier today is not included in the field of view. IMPRESSION: Tip and side port of the enteric tube below the diaphragm in the stomach. Electronically Signed   By: Andrea Gasman M.D.   On: 08/06/2024 18:33   DG Abd Portable 1V-Small Bowel Protocol-Position Verification Result Date: 08/06/2024 CLINICAL DATA:  Nasogastric tube placement. Abdominal pain and vomiting. EXAM: PORTABLE ABDOMEN - 1 VIEW COMPARISON:  CT earlier today FINDINGS: Imaging obtained of the abdomen and pelvis. Nasogastric tube is not seen on provided views. Gaseous small bowel distension corresponding to small-bowel obstruction on earlier CT. Excreted IV contrast in the renal collecting systems and bladder. IMPRESSION: 1. Nasogastric tube is not seen on provided views. Consider chest radiograph. 2. Gaseous small bowel distension corresponding to small-bowel obstruction on earlier CT. Electronically Signed   By: Andrea Gasman M.D.   On: 08/06/2024 14:03   CT ABDOMEN PELVIS W CONTRAST Result Date: 08/06/2024 CLINICAL DATA:  Acute abdominal pain. Vomiting. Patient reports being told last night in the ER she has bowel obstruction, she wants a second opinion. EXAM: CT ABDOMEN AND PELVIS WITH CONTRAST TECHNIQUE: Multidetector CT imaging of the abdomen and pelvis was performed using the standard protocol following bolus administration of intravenous contrast. RADIATION DOSE  REDUCTION: This exam was performed according to the departmental dose-optimization program which includes automated exposure control, adjustment of the mA and/or kV according to patient size and/or use of iterative reconstruction technique. CONTRAST:  OMNIPAQUE  IOHEXOL  300 MG/ML  SOLN COMPARISON:  Noncontrast CT 05/09/2021 FINDINGS: Lower chest: Heterogeneous pulmonary parenchyma. No pleural effusion or confluent opacity. 4 mm subpleural right lower lobe nodule, series 6, image 17, unchanged from 2022 and considered benign. No further imaging follow-up is needed. Hepatobiliary: The liver is enlarged spanning 21.8 cm cranial caudal with diffuse steatosis. No focal liver abnormality. Gallbladder physiologically distended, no calcified stone. No biliary dilatation. Pancreas: No ductal dilatation or inflammation. Spleen: Upper normal in size, stable from prior exam. AP dimension 14.3 cm, 12.5 cm cranial caudal. No focal splenic abnormality. Adrenals/Urinary Tract: No adrenal nodule. No hydronephrosis or perinephric edema. No renal calculi or focal lesion. Urinary bladder is physiologically distended without wall thickening. Stomach/Bowel: Dilated fluid-filled small bowel with fecalization  of small bowel contents. Transition from dilated to nondilated in the right lower quadrant, series 2, image 76. The more distal small bowel is decompressed. Mild mesenteric edema adjacent to the dilated small bowel segments. No bowel pneumatosis. The appendix is normal. Moderate volume of stool in the colon. Minimal sigmoid colonic diverticulosis without diverticulitis. Vascular/Lymphatic: Aorto bi-iliac atherosclerosis. Patent portal, splenic, and mesenteric veins. No portal venous or mesenteric gas. No enlarged lymph nodes in the abdomen or pelvis. Reproductive: Bulbous uterus with calcified fibroids. No adnexal mass. Other: Minimal mesenteric free fluid but no significant abdominopelvic ascites. No free air. No abdominal wall  hernia. Musculoskeletal: There are no acute or suspicious osseous abnormalities. Degenerative change in the lumbar spine and hips. IMPRESSION: 1. Small bowel obstruction with transition point in the right lower quadrant, likely due to adhesions. 2. Hepatomegaly and hepatic steatosis. 3. Minimal sigmoid colonic diverticulosis without diverticulitis. 4. Uterine fibroids. Aortic Atherosclerosis (ICD10-I70.0). Electronically Signed   By: Andrea Gasman M.D.   On: 08/06/2024 12:28    Anti-infectives: Anti-infectives (From admission, onward)    None        Assessment/Plan SBO - no prior abdominal surgery and abdominal exam is benign this AM - has not been given any gastrografin - recommend SBO protocol today  - ambulate as able - keep K>4.0, Mg >2.0 and Phos >3.0 to help optimize bowel function  - no indication for emergent surgical intervention at this time   FEN: NPO, IVF per TRH, NGT to LIWS VTE: ok to have SQH or LMWH from surgery standpoint ID: no current abx    LOS: 2 days   I reviewed hospitalist notes, last 24 h vitals and pain scores, last 48 h intake and output, last 24 h labs and trends, and last 24 h imaging results.  This care required moderate level of medical decision making.    Burnard JONELLE Louder, [facility] 08/08/2024, 11:06 AM Please see Amion for pager number during day hours 7:00am-4:30pm

## 2024-08-08 NOTE — Plan of Care (Signed)
  Problem: Education: Goal: Ability to describe self-care measures that may prevent or decrease complications (Diabetes Survival Skills Education) will improve Outcome: Progressing Goal: Individualized Educational Video(s) Outcome: Progressing   Problem: Coping: Goal: Ability to adjust to condition or change in health will improve Outcome: Progressing   Problem: Fluid Volume: Goal: Ability to maintain a balanced intake and output will improve Outcome: Progressing   Problem: Health Behavior/Discharge Planning: Goal: Ability to identify and utilize available resources and services will improve Outcome: Progressing Goal: Ability to manage health-related needs will improve Outcome: Progressing   Problem: Metabolic: Goal: Ability to maintain appropriate glucose levels will improve Outcome: Progressing   Problem: Nutritional: Goal: Progress toward achieving an optimal weight will improve Outcome: Progressing   Problem: Education: Goal: Knowledge of General Education information will improve Description: Including pain rating scale, medication(s)/side effects and non-pharmacologic comfort measures Outcome: Progressing   Problem: Health Behavior/Discharge Planning: Goal: Ability to manage health-related needs will improve Outcome: Progressing   Problem: Clinical Measurements: Goal: Ability to maintain clinical measurements within normal limits will improve Outcome: Progressing Goal: Will remain free from infection Outcome: Progressing Goal: Diagnostic test results will improve Outcome: Progressing Goal: Respiratory complications will improve Outcome: Progressing   Problem: Activity: Goal: Risk for activity intolerance will decrease Outcome: Progressing   Problem: Coping: Goal: Level of anxiety will decrease Outcome: Progressing   Problem: Elimination: Goal: Will not experience complications related to bowel motility Outcome: Progressing   Problem: Pain  Managment: Goal: General experience of comfort will improve and/or be controlled Outcome: Progressing   Problem: Safety: Goal: Ability to remain free from injury will improve Outcome: Progressing

## 2024-08-08 NOTE — Plan of Care (Signed)
  Problem: Health Behavior/Discharge Planning: Goal: Ability to manage health-related needs will improve Outcome: Progressing   Problem: Education: Goal: Knowledge of General Education information will improve Description: Including pain rating scale, medication(s)/side effects and non-pharmacologic comfort measures Outcome: Progressing   Problem: Health Behavior/Discharge Planning: Goal: Ability to manage health-related needs will improve Outcome: Progressing   Problem: Activity: Goal: Risk for activity intolerance will decrease Outcome: Progressing

## 2024-08-08 NOTE — Progress Notes (Addendum)
       Overnight   NAME: Desiree [name] MRN: [id number] DOB : 26-Nov-1965    Date of Service   08/08/2024   HPI/Events of Note    Notified by RN for Concern of possible displacement of NGT.  58 y.o patient with non-Hodgkin's lymphoma followed by Dr. Lazaro such, hypertension, hyperlipidemia, chronic anemia, diabetes mellitus, palpitations.  Admitted through the ER with abdominal pain, nausea and vomiting, who had previously gone to [facility] [place]  ER where she was told there was concern for small bowel obstruction. She returned to seek care where her physicians are here in [place]. Consult was to-Surgical  Diagnosis is small bowel obstruction. Earlier CT of abdomen confirms small bowel obstruction with transition point in the right lower quadrant. Path is to continue n.p.o., NG tube to low intermittent suction, pain control, antiemetics, IV fluids.  Bedside visit Informed by nursing staff that tube had removed/migrated and was causing irritation. In the course of their assessment they found that the tube had moved from the original measurement marks and was causing irritation, at that time the tube was reinserted and NP was called.  Patient was not in distress on arrival to bedside. Stat KUB was ordered for placement verification, at that time suction was off.   Imaging in part  IMPRESSION: 1. Enteric tube in place with distal tip terminating within the proximal stomach and side port in distal esophagus. Advancement of the catheter by 10 cm would more optimally positioned device.   Electronically signed by: Dorethia Molt MD 08/08/2024 02:40 AM EDT RP Workstation: [id number]  At that point NG tube was moved as advised by radiology read. NG tube immediately returned fluid consistent with previous color consistency and volume.  Tube is now at original marked position and continues to return drainage consistent with prior color, consistency, and volume.  This  has relieved earlier complaint and concern of assessing RN. Patient is in no obvious or stated distress and is able to rest at this point.    Interventions/ Plan   Continue all previous orders from attending and consult Reassess markings of tube depth as usual        Update 0655 hrs  Pt continues to have no obvious or stated distress with NGT function intact    Lynwood Kipper BSN MSNA MSN ACNPC-AG Acute Care Nurse Practitioner [facility]

## 2024-08-09 DIAGNOSIS — K56609 Unspecified intestinal obstruction, unspecified as to partial versus complete obstruction: Secondary | ICD-10-CM | POA: Diagnosis not present

## 2024-08-09 DIAGNOSIS — R109 Unspecified abdominal pain: Secondary | ICD-10-CM | POA: Diagnosis not present

## 2024-08-09 LAB — RENAL FUNCTION PANEL
Albumin: 3.8 g/dL (ref 3.5–5.0)
Anion gap: 13 (ref 5–15)
BUN: 6 mg/dL (ref 6–20)
CO2: 24 mmol/L (ref 22–32)
Calcium: 9.1 mg/dL (ref 8.9–10.3)
Chloride: 113 mmol/L — ABNORMAL HIGH (ref 98–111)
Creatinine, Ser: 0.63 mg/dL (ref 0.44–1.00)
GFR, Estimated: >60 mL/min (ref >60)
Glucose, Bld: 122 mg/dL — ABNORMAL HIGH (ref 70–99)
Phosphorus: 2.9 mg/dL (ref 2.5–4.6)
Potassium: 3.6 mmol/L (ref 3.5–5.1)
Sodium: 149 mmol/L — ABNORMAL HIGH (ref 135–145)

## 2024-08-09 LAB — CBC
HCT: 43.1 % (ref 36.0–46.0)
Hemoglobin: 12.6 g/dL (ref 12.0–15.0)
MCH: 25.1 pg — ABNORMAL LOW (ref 26.0–34.0)
MCHC: 29.2 g/dL — ABNORMAL LOW (ref 30.0–36.0)
MCV: 85.9 fL (ref 80.0–100.0)
Platelets: 203 K/uL (ref 150–400)
RBC: 5.02 MIL/uL (ref 3.87–5.11)
RDW: 14.8 % (ref 11.5–15.5)
WBC: 7.6 K/uL (ref 4.0–10.5)
nRBC: 0 % (ref 0.0–0.2)

## 2024-08-09 LAB — GLUCOSE, CAPILLARY
Glucose-Capillary: 105 mg/dL — ABNORMAL HIGH (ref 70–99)
Glucose-Capillary: 122 mg/dL — ABNORMAL HIGH (ref 70–99)
Glucose-Capillary: 150 mg/dL — ABNORMAL HIGH (ref 70–99)
Glucose-Capillary: 154 mg/dL — ABNORMAL HIGH (ref 70–99)
Glucose-Capillary: 168 mg/dL — ABNORMAL HIGH (ref 70–99)
Glucose-Capillary: 94 mg/dL (ref 70–99)

## 2024-08-09 MED ORDER — METOPROLOL TARTRATE 50 MG PO TABS
50.0000 mg | ORAL_TABLET | Freq: Two times a day (BID) | ORAL | Status: DC
  Administered 2024-08-09 – 2024-08-10 (×2): 50 mg via ORAL
  Filled 2024-08-09 (×2): qty 1

## 2024-08-09 MED ORDER — FUROSEMIDE 10 MG/ML IJ SOLN
20.0000 mg | Freq: Once | INTRAMUSCULAR | Status: AC
  Administered 2024-08-09: 20 mg via INTRAVENOUS
  Filled 2024-08-09: qty 2

## 2024-08-09 MED ORDER — LACTATED RINGERS IV SOLN: INTRAVENOUS | Status: DC

## 2024-08-09 MED ORDER — FUROSEMIDE 40 MG PO TABS
40.0000 mg | ORAL_TABLET | Freq: Every day | ORAL | Status: DC
  Administered 2024-08-10: 40 mg via ORAL
  Filled 2024-08-09: qty 1

## 2024-08-09 MED ORDER — DEXTROSE-SODIUM CHLORIDE 5-0.45 % IV SOLN: INTRAVENOUS | Status: DC

## 2024-08-09 NOTE — Progress Notes (Cosign Needed Addendum)
 Progress Note     Subjective: Pt denies abdominal pain and reports she is passing some flatus and had several BM.   Objective: Vital signs in last 24 hours: Temp:  [98.2 F (36.8 C)-98.6 F (37 C)] 98.4 F (36.9 C) (10/28 0507) Pulse Rate:  [98-99] 99 (10/28 0507) Resp:  [16-18] 16 (10/28 0507) BP: (129-149)/(76-88) 149/76 (10/28 0507) SpO2:  [95 %-99 %] 99 % (10/28 0507) Last BM Date : 08/08/24  Intake/Output from previous day: 10/27 0701 - 10/28 0700 In: 180 [NG/GT:180] Out: 625 [Emesis/NG output:625] Intake/Output this shift: Total I/O In: 240 [P.O.:240] Out: -   PE: General: pleasant, WD, obese female who is laying in bed in NAD Heart: regular, rate, and rhythm.   Lungs: Respiratory effort nonlabored Abd: soft, NT, ND Psych: A&Ox3 with an appropriate affect.    Lab Results:  Recent Labs    08/08/24 0612 08/09/24 0609  WBC 6.9 7.6  HGB 12.7 12.6  HCT 41.6 43.1  PLT 187 203   BMET Recent Labs    08/08/24 0612 08/09/24 0609  NA 151*  150* 149*  K 3.8  3.8 3.6  CL 112*  112* 113*  CO2 26  27 24   GLUCOSE 117*  120* 122*  BUN 7  7 6   CREATININE 0.64  0.64 0.63  CALCIUM  9.0  9.1 9.1   PT/INR No results for input(s): LABPROT, INR in the last 72 hours. CMP     Component Value Date/Time   NA 149 (H) 08/09/2024 0609   NA 138 07/24/2017 1105   K 3.6 08/09/2024 0609   K 4.2 07/24/2017 1105   CL 113 (H) 08/09/2024 0609   CL 106 11/19/2012 0837   CO2 24 08/09/2024 0609   CO2 26 07/24/2017 1105   GLUCOSE 122 (H) 08/09/2024 0609   GLUCOSE 362 (H) 07/24/2017 1105   GLUCOSE 107 (H) 11/19/2012 0837   BUN 6 08/09/2024 0609   BUN 10.0 07/24/2017 1105   CREATININE 0.63 08/09/2024 0609   CREATININE 0.82 08/08/2022 0801   CREATININE 0.8 07/24/2017 1105   CALCIUM  9.1 08/09/2024 0609   CALCIUM  8.8 07/24/2017 1105   PROT 6.9 08/06/2024 0911   PROT 6.3 (L) 07/24/2017 1105   ALBUMIN 3.8 08/09/2024 0609   ALBUMIN 3.4 (L) 07/24/2017 1105   AST  20 08/06/2024 0911   AST 12 (L) 08/08/2022 0801   AST 13 07/24/2017 1105   ALT 26 08/06/2024 0911   ALT 13 08/08/2022 0801   ALT 21 07/24/2017 1105   ALKPHOS 89 08/06/2024 0911   ALKPHOS 112 07/24/2017 1105   BILITOT 0.6 08/06/2024 0911   BILITOT 0.6 08/08/2022 0801   BILITOT 0.55 07/24/2017 1105   GFRNONAA >60 08/09/2024 0609   GFRNONAA >60 08/08/2022 0801   GFRAA >60 05/18/2020 0825   GFRAA >60 10/22/2018 0809   Lipase     Component Value Date/Time   LIPASE 21 08/06/2024 0911       Studies/Results: DG Abd Portable 1V-Small Bowel Obstruction Protocol-initial, 8 hr delay Result Date: 08/08/2024 EXAM: 1 VIEW XRAY OF THE ABDOMEN 08/08/2024 06:02:00 PM COMPARISON: X-ray abdomen 08/08/2024 at 2:36 AM. CLINICAL HISTORY: 8 hr delay for SBO - pt has had multiple BM's since contrast and had to go during imaging. FINDINGS: LINES, TUBES AND DEVICES: Enteric tube courses below the hemidiaphragm with the tip and side port overlying the expected region of the gastric lumen. BOWEL: PO contrast reaches the rectum. Nonobstructive bowel gas pattern. SOFT TISSUES: No opaque urinary  calculi. BONES: No acute osseous abnormality. IMPRESSION: 1. Enteric tube appropriately positioned. 2. PO contrast reaches the rectum. Electronically signed by: Morgane Naveau MD 08/08/2024 09:35 PM EDT RP Workstation: [id number]   DG Abd 1 View Result Date: 08/08/2024 EXAM: 1 VIEW XRAY OF THE ABDOMEN 08/08/2024 02:36:00 AM COMPARISON: 08/07/2024 CLINICAL HISTORY: Encounter for imaging study to confirm nasogastric (NG) tube placement FINDINGS: BOWEL: Nonobstructive bowel gas pattern. SOFT TISSUES: Enteric tube in place with distal tip terminating within the proximal stomach. Side port in distal esophagus. BONES: No acute osseous abnormality. IMPRESSION: 1. Enteric tube in place with distal tip terminating within the proximal stomach and side port in distal esophagus. Advancement of the catheter by 10 cm would more optimally  positioned device. Electronically signed by: Dorethia Molt MD 08/08/2024 02:40 AM EDT RP Workstation: [id number]    Anti-infectives: Anti-infectives (From admission, onward)    None        Assessment/Plan SBO - no prior abdominal surgery and abdominal exam is benign this AM - SBO protocol - 8h delay with contrast in rectum and pt having bowel function - FLD this AM, maybe advance to soft this evening  - ambulate as able - keep K>4.0, Mg >2.0 and Phos >3.0 to help optimize bowel function  - no indication for emergent surgical intervention at this time  - if advancing to soft diet this evening surgery will sign off and pt may discharge when medically cleared   FEN: FLD VTE: ok to have SQH or LMWH from surgery standpoint ID: no current abx    LOS: 3 days   I reviewed hospitalist notes, last 24 h vitals and pain scores, last 48 h intake and output, last 24 h labs and trends, and last 24 h imaging results.  This care required moderate level of medical decision making.    Burnard JONELLE Louder, [facility] Surgery 08/09/2024, 11:31 AM Please see Amion for pager number during day hours 7:00am-4:30pm    Addendum: Notified by RN patient tolerating FLD well and would like to advance to soft diet for dinner. Orders placed. General surgery will sign off but are available as needed if concerns arise.   Burnard JONELLE Louder, [facility] Surgery 08/09/2024, 12:45 PM Please see Amion for pager number during day hours 7:00am-4:30pm

## 2024-08-09 NOTE — Progress Notes (Signed)
 [facility]                                                                              Desiree [name], is a 58 y.o. female, DOB - [birth year], FMW:[id number] Admit date - 08/06/2024    Outpatient Primary MD for the patient is Desiree [name], [place], [place]  LOS - 3  days  Chief Complaint  Patient presents with   Abdominal Pain       Brief summary   Patient is a 58 y.o. female with non-Hodgkin lymphoma, follows Dr. Lonn, hypertension, hyperlipidemia, chronic anemia, diabetes mellitus, palpitations, presented to ED with abdominal pain, nausea and vomiting.  Patient reported that her symptoms started a day before the admission around noon with intractable nausea vomiting and unable to keep anything down.  She also had lower abdominal pain 10/10, sharp and intermittent.  She went to the local ER in [place]  where she works and she was told there was a concern for small bowel obstruction.   She lives in [place] and has all her doctors here hence came down to [facility], ED. at the time of admission, still had diffuse abdominal pain, last vomiting episode was 2 AM in the morning.  Patient was admitted for further workup.   Assessment & Plan     SBO (small bowel obstruction) (HCC) -No prior history of SBO or abdominal surgeries.  Symptoms abruptly started around noon yesterday with multiple episodes of nausea and vomiting, abdominal pain, unable to hold anything down - CT abdomen confirmed SBO with transition point in the right lower quadrant - NGT removed, tolerating clear liquid diet, diet advanced to soft solids today for supper -Encouraged oral diet and hydration, feeling edematous, DC IV fluids      Hypertension, tachycardia -known history of tachycardia and palpitation, on metoprolol  100 mg twice daily - Resume p.o. 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 -Feeling swollen, weight 256 LBS on admission, 262.9  today -Lasix  20 mg IV x 1, will resume outpatient dose of Lasix  40 mg daily tomorrow -Continue beta-blocker     Type 2 diabetes mellitus with complication, without long-term current use of insulin  (HCC), uncontrolled -Hold Mounjaro, metformin, Farxiga - Hemoglobin A1c 9.1    CBG (last 3)  Recent Labs    08/09/24 0502 08/09/24 0810 08/09/24 1225  GLUCAP 105* 150* 168*  - Continue moderate SSI      Hyperlipidemia, CAD - Hold aspirin , continue IV Lopressor    Obesity class II Estimated body mass index is 40.57 kg/m as calculated from the following:   Height as of this encounter: 5' 7.5 (1.715 m).   Weight as of this encounter: 119.3 kg.  Code Status: Full code DVT Prophylaxis:  SCDs Start: 08/06/24 1708   Level of Care: Level of care: Telemetry Family Communication: Updated patient's sister at the bedside today Disposition Plan:      Remains inpatient appropriate: Hopefully DC home tomorrow   Procedures:    Consultants:   Surgery  Antimicrobials:   Anti-infectives (From admission, onward)    None  Medications  bisacodyl  10 mg Rectal Daily   furosemide   20 mg Intravenous Once   [START ON 08/10/2024] furosemide   40 mg Oral Daily   insulin  aspart  0-15 Units Subcutaneous Q4H   metoprolol  tartrate  5 mg Intravenous Q8H      Subjective:   Desiree [name] was seen and examined today.  Feeling much better today had BMs today, NGT removed, placed on full liquid diet.  Feeling somewhat swollen due to IV fluids  Objective:   Vitals:   08/08/24 2025 08/09/24 0507 08/09/24 1339 08/09/24 1405  BP: 131/78 (!) 149/76 120/71   Pulse: 98 99 96   Resp: 18 16 16    Temp: 98.6 F (37 C) 98.4 F (36.9 C) 98.5 F (36.9 C)   TempSrc: Oral Oral Oral   SpO2: 95% 99% 98%   Weight:    119.3 kg  Height:        Intake/Output Summary (Last 24 hours) at 08/09/2024 1416 Last data filed at 08/09/2024 1409 Gross per 24 hour  Intake 1440 ml  Output 625 ml   Net 815 ml     Wt Readings from Last 3 Encounters:  08/09/24 119.3 kg  05/17/24 115.3 kg  11/17/23 111.6 kg   Physical Exam General: Alert and oriented x 3, NAD Cardiovascular: S1 S2 clear, RRR.  Respiratory: Diminished breath sounds at bases Gastrointestinal: Soft, nontender, nondistended, NBS Ext: Trace pedal edema bilaterally Neuro: no new deficits Psych: Normal affect   Data Reviewed:  I have personally reviewed following labs    CBC Lab Results  Component Value Date   WBC 7.6 08/09/2024   RBC 5.02 08/09/2024   HGB 12.6 08/09/2024   HCT 43.1 08/09/2024   MCV 85.9 08/09/2024   MCH 25.1 (L) 08/09/2024   PLT 203 08/09/2024   MCHC 29.2 (L) 08/09/2024   RDW 14.8 08/09/2024   LYMPHSABS 1.0 08/06/2024   MONOABS 0.6 08/06/2024   EOSABS 0.0 08/06/2024   BASOSABS 0.0 08/06/2024     Last metabolic panel Lab Results  Component Value Date   NA 149 (H) 08/09/2024   K 3.6 08/09/2024   CL 113 (H) 08/09/2024   CO2 24 08/09/2024   BUN 6 08/09/2024   CREATININE 0.63 08/09/2024   GLUCOSE 122 (H) 08/09/2024   GFRNONAA >60 08/09/2024   GFRAA >60 05/18/2020   CALCIUM  9.1 08/09/2024   PHOS 2.9 08/09/2024   PROT 6.9 08/06/2024   ALBUMIN 3.8 08/09/2024   BILITOT 0.6 08/06/2024   ALKPHOS 89 08/06/2024   AST 20 08/06/2024   ALT 26 08/06/2024   ANIONGAP 13 08/09/2024    CBG (last 3)  Recent Labs    08/09/24 0502 08/09/24 0810 08/09/24 1225  GLUCAP 105* 150* 168*      Coagulation Profile: No results for input(s): INR, PROTIME in the last 168 hours.   Radiology Studies: I have personally reviewed the imaging studies  DG Abd Portable 1V-Small Bowel Obstruction Protocol-initial, 8 hr delay Result Date: 08/08/2024 EXAM: 1 VIEW XRAY OF THE ABDOMEN 08/08/2024 06:02:00 PM COMPARISON: X-ray abdomen 08/08/2024 at 2:36 AM. CLINICAL HISTORY: 8 hr delay for SBO - pt has had multiple BM's since contrast and had to go during imaging. FINDINGS: LINES, TUBES AND DEVICES:  Enteric tube courses below the hemidiaphragm with the tip and side port overlying the expected region of the gastric lumen. BOWEL: PO contrast reaches the rectum. Nonobstructive bowel gas pattern. SOFT TISSUES: No opaque urinary calculi. BONES: No acute osseous abnormality. IMPRESSION: 1.  Enteric tube appropriately positioned. 2. PO contrast reaches the rectum. Electronically signed by: Morgane Naveau MD 08/08/2024 09:35 PM EDT RP Workstation: [id number]   DG Abd 1 View Result Date: 08/08/2024 EXAM: 1 VIEW XRAY OF THE ABDOMEN 08/08/2024 02:36:00 AM COMPARISON: 08/07/2024 CLINICAL HISTORY: Encounter for imaging study to confirm nasogastric (NG) tube placement FINDINGS: BOWEL: Nonobstructive bowel gas pattern. SOFT TISSUES: Enteric tube in place with distal tip terminating within the proximal stomach. Side port in distal esophagus. BONES: No acute osseous abnormality. IMPRESSION: 1. Enteric tube in place with distal tip terminating within the proximal stomach and side port in distal esophagus. Advancement of the catheter by 10 cm would more optimally positioned device. Electronically signed by: Dorethia Molt MD 08/08/2024 02:40 AM EDT RP Workstation: [id number]       Nydia Distance M.D. [facility] 08/09/2024, 2:16 PM  Available via Epic secure chat 7am-7pm After 7 pm, please refer to night coverage provider listed on amion.

## 2024-08-09 NOTE — Progress Notes (Signed)
   08/09/24 1043  TOC Brief Assessment  Insurance and Status Reviewed  Patient has primary care physician Yes  Home environment has been reviewed home  Prior level of function: independent  Prior/Current Home Services No current home services  Social Drivers of Health Review SDOH reviewed no interventions necessary  Readmission risk has been reviewed Yes  Transition of care needs transition of care needs identified, TOC will continue to follow

## 2024-08-09 NOTE — Plan of Care (Signed)
  Problem: Education: Goal: Ability to describe self-care measures that may prevent or decrease complications (Diabetes Survival Skills Education) will improve Outcome: Progressing Goal: Individualized Educational Video(s) Outcome: Progressing   Problem: Coping: Goal: Ability to adjust to condition or change in health will improve Outcome: Progressing   Problem: Fluid Volume: Goal: Ability to maintain a balanced intake and output will improve Outcome: Progressing   Problem: Health Behavior/Discharge Planning: Goal: Ability to identify and utilize available resources and services will improve Outcome: Progressing Goal: Ability to manage health-related needs will improve Outcome: Progressing   Problem: Metabolic: Goal: Ability to maintain appropriate glucose levels will improve Outcome: Progressing   Problem: Nutritional: Goal: Progress toward achieving an optimal weight will improve Outcome: Progressing   Problem: Education: Goal: Knowledge of General Education information will improve Description: Including pain rating scale, medication(s)/side effects and non-pharmacologic comfort measures Outcome: Progressing   Problem: Health Behavior/Discharge Planning: Goal: Ability to manage health-related needs will improve Outcome: Progressing   Problem: Clinical Measurements: Goal: Ability to maintain clinical measurements within normal limits will improve Outcome: Progressing Goal: Will remain free from infection Outcome: Progressing Goal: Diagnostic test results will improve Outcome: Progressing Goal: Respiratory complications will improve Outcome: Progressing   Problem: Activity: Goal: Risk for activity intolerance will decrease Outcome: Progressing   Problem: Coping: Goal: Level of anxiety will decrease Outcome: Progressing   Problem: Elimination: Goal: Will not experience complications related to bowel motility Outcome: Progressing   Problem: Pain  Managment: Goal: General experience of comfort will improve and/or be controlled Outcome: Progressing   Problem: Safety: Goal: Ability to remain free from injury will improve Outcome: Progressing

## 2024-08-09 NOTE — Progress Notes (Signed)
 Mobility Specialist Progress Note:   08/09/24 1502  Mobility  Activity Ambulated independently  Level of Assistance Independent  Assistive Device None  Distance Ambulated (ft) 300 ft  Activity Response Tolerated well  Mobility Referral Yes  Mobility visit 1 Mobility  Mobility Specialist Start Time (ACUTE ONLY) 1427  Mobility Specialist Stop Time (ACUTE ONLY) 1437  Mobility Specialist Time Calculation (min) (ACUTE ONLY) 10 min   Pt was received in bed and agreed to mobility. No complaints during session. Returned to bed with all needs met, call bell in reach. Left in room with family.  [employer] - Mobility Specialist

## 2024-08-09 NOTE — Plan of Care (Signed)
   Problem: Coping: Goal: Ability to adjust to condition or change in health will improve Outcome: Progressing   Problem: Coping: Goal: Level of anxiety will decrease Outcome: Progressing   Problem: Pain Managment: Goal: General experience of comfort will improve and/or be controlled Outcome: Progressing   Problem: Safety: Goal: Ability to remain free from injury will improve Outcome: Progressing

## 2024-08-10 LAB — RENAL FUNCTION PANEL
Albumin: 3.3 g/dL — ABNORMAL LOW (ref 3.5–5.0)
Anion gap: 10 (ref 5–15)
BUN: <5 mg/dL — ABNORMAL LOW (ref 6–20)
CO2: 26 mmol/L (ref 22–32)
Calcium: 8.6 mg/dL — ABNORMAL LOW (ref 8.9–10.3)
Chloride: 105 mmol/L (ref 98–111)
Creatinine, Ser: 0.56 mg/dL (ref 0.44–1.00)
GFR, Estimated: >60 mL/min (ref >60)
Glucose, Bld: 94 mg/dL (ref 70–99)
Phosphorus: 3 mg/dL (ref 2.5–4.6)
Potassium: 3.2 mmol/L — ABNORMAL LOW (ref 3.5–5.1)
Sodium: 140 mmol/L (ref 135–145)

## 2024-08-10 LAB — CBC
HCT: 35.7 % — ABNORMAL LOW (ref 36.0–46.0)
Hemoglobin: 11.2 g/dL — ABNORMAL LOW (ref 12.0–15.0)
MCH: 25.7 pg — ABNORMAL LOW (ref 26.0–34.0)
MCHC: 31.4 g/dL (ref 30.0–36.0)
MCV: 81.9 fL (ref 80.0–100.0)
Platelets: 160 K/uL (ref 150–400)
RBC: 4.36 MIL/uL (ref 3.87–5.11)
RDW: 14.1 % (ref 11.5–15.5)
WBC: 5.3 K/uL (ref 4.0–10.5)
nRBC: 0 % (ref 0.0–0.2)

## 2024-08-10 LAB — GLUCOSE, CAPILLARY
Glucose-Capillary: 109 mg/dL — ABNORMAL HIGH (ref 70–99)
Glucose-Capillary: 129 mg/dL — ABNORMAL HIGH (ref 70–99)
Glucose-Capillary: 88 mg/dL (ref 70–99)

## 2024-08-10 MED ORDER — POTASSIUM CHLORIDE CRYS ER 20 MEQ PO TBCR
40.0000 meq | EXTENDED_RELEASE_TABLET | Freq: Once | ORAL | Status: AC
  Administered 2024-08-10: 40 meq via ORAL
  Filled 2024-08-10: qty 2

## 2024-08-10 NOTE — Plan of Care (Signed)
  Problem: Education: Goal: Ability to describe self-care measures that may prevent or decrease complications (Diabetes Survival Skills Education) will improve Outcome: Completed/Met Goal: Individualized Educational Video(s) Outcome: Completed/Met   Problem: Coping: Goal: Ability to adjust to condition or change in health will improve Outcome: Completed/Met   Problem: Fluid Volume: Goal: Ability to maintain a balanced intake and output will improve Outcome: Completed/Met   Problem: Health Behavior/Discharge Planning: Goal: Ability to identify and utilize available resources and services will improve Outcome: Completed/Met Goal: Ability to manage health-related needs will improve Outcome: Completed/Met   Problem: Metabolic: Goal: Ability to maintain appropriate glucose levels will improve Outcome: Completed/Met   Problem: Nutritional: Goal: Progress toward achieving an optimal weight will improve Outcome: Completed/Met   Problem: Education: Goal: Knowledge of General Education information will improve Description: Including pain rating scale, medication(s)/side effects and non-pharmacologic comfort measures Outcome: Completed/Met   Problem: Health Behavior/Discharge Planning: Goal: Ability to manage health-related needs will improve Outcome: Completed/Met   Problem: Clinical Measurements: Goal: Ability to maintain clinical measurements within normal limits will improve Outcome: Completed/Met Goal: Will remain free from infection Outcome: Completed/Met Goal: Diagnostic test results will improve Outcome: Completed/Met Goal: Respiratory complications will improve Outcome: Completed/Met   Problem: Activity: Goal: Risk for activity intolerance will decrease Outcome: Completed/Met   Problem: Coping: Goal: Level of anxiety will decrease Outcome: Completed/Met   Problem: Elimination: Goal: Will not experience complications related to bowel motility Outcome:  Completed/Met   Problem: Pain Managment: Goal: General experience of comfort will improve and/or be controlled Outcome: Completed/Met   Problem: Safety: Goal: Ability to remain free from injury will improve Outcome: Completed/Met

## 2024-08-10 NOTE — Discharge Summary (Signed)
 Physician Discharge Summary   Patient: Desiree [name] MRN: [id number] DOB: 1965/12/29  Admit date:     08/06/2024  Discharge date: 08/10/24  Discharge Physician: Carliss LELON Canales   PCP: Alvera Reagin, PA   Recommendations at discharge:    Pt to be discharged home.   If you experience worsening fever, chills, chest pain, shortness of breath, or other concerning symptoms, please call your PCP or go to the emergency department immediately.  Discharge Diagnoses: Principal Problem:   SBO (small bowel obstruction) (HCC) Active Problems:   Tachycardia   Hypertension   Type 2 diabetes mellitus with complication, without long-term current use of insulin  (HCC)   Hyperlipidemia LDL goal <70   Status post coronary artery stent placement  Resolved Problems:   * No resolved hospital problems. *   Hospital Course:   58 y.o. female with non-Hodgkin lymphoma, follows Dr. Lonn, hypertension, hyperlipidemia, chronic anemia, diabetes mellitus, palpitations, presented to ED with abdominal pain, nausea and vomiting.  Patient reported that her symptoms started a day before the admission around noon with intractable nausea vomiting and unable to keep anything down.  She also had lower abdominal pain 10/10, sharp and intermittent.  She went to the local ER in [place]  where she works and she was told there was a concern for small bowel obstruction.   She lives in [place] and has all her doctors here hence came down to [facility], ED. at the time of admission, still had diffuse abdominal pain, last vomiting episode was 2 AM in the morning.  Patient was admitted for further workup.  Assessment and Plan:  Small bowel obstruction - CT abdomen confirming SBO with transition point in right lower quadrant.  General Surgery consulted, NG tube placed, antiemetics, IV fluids.  Throughout the course hospital stay showed improvement in her symptoms.  No surgical intervention required.  SBO  resolved, diet advance as tolerated.  Patient ambulatory, tolerating p.o., eager for discharge home.  Hypertension - Resume home medication regimen upon discharge.  Uncontrolled diabetes mellitus - A1c noted to be 9.1 suggesting poor control.  Patient states she follows close with PCP in the outpatient setting attempting to maximize her diabetic regiment.  Recommend continued outpatient follow-up.  Informed patient about long-term consequences of uncontrolled diabetes including advanced kidney disease, blindness, neuropathy, and increased cardiovascular risk of stroke and heart attack.   Recommend yearly foot/eye/urine protein exams.   Consultants: General surgery Procedures performed: None Disposition: Home Diet recommendation:  Discharge Diet Orders (From admission, onward)     Start     Ordered   08/10/24 0000  Diet - low sodium heart healthy        08/10/24 0944           Cardiac and Carb modified diet  DISCHARGE MEDICATION: Allergies as of 08/10/2024       Reactions   Erythromycin Anaphylaxis   Swelling and difficulty breathing.   Rituxan  [rituximab ] Shortness Of Breath, Cough   Pt states that last time she had rituxan  her throat swelled up and it was difficult to breathe,    Lovastatin    Leg cramps   Tolnaftate Hives   Reaction caused by pills and the cream.        Medication List     TAKE these medications    acetaminophen  500 MG tablet Commonly known as: TYLENOL  Take 1 tablet (500 mg total) by mouth every 8 (eight) hours as needed (pain).   aspirin  81 MG chewable tablet Chew  1 tablet (81 mg total) by mouth daily.   Farxiga 10 MG Tabs tablet Generic drug: dapagliflozin propanediol Take 10 mg by mouth daily.   fish oil-omega-3 fatty acids  1000 MG capsule Take 1 g by mouth 2 (two) times daily.   furosemide  40 MG tablet Commonly known as: LASIX  Take 40 mg by mouth as needed.   losartan  25 MG tablet Commonly known as: COZAAR  Take 25 mg by mouth  daily.   metFORMIN 1000 MG tablet Commonly known as: GLUCOPHAGE Take 1,000 mg by mouth 2 (two) times daily with a meal.   metoprolol  tartrate 100 MG tablet Commonly known as: LOPRESSOR  Take 1 tablet (100 mg total) by mouth 2 (two) times daily. PT OVERDUE FOR OV PLEASE CALL FOR APPT   Mounjaro 7.5 MG/0.5ML Pen Generic drug: tirzepatide Inject 7.5 mg into the skin once a week. On Fridays   Nexletol 180 MG Tabs Generic drug: Bempedoic Acid Take 1 tablet by mouth daily.   nitroGLYCERIN  0.4 MG SL tablet Commonly known as: NITROSTAT  Place 1 tablet (0.4 mg total) under the tongue every 5 (five) minutes as needed for chest pain.   Vitamin D  (Ergocalciferol ) 1.25 MG (50000 UNIT) Caps capsule Commonly known as: DRISDOL TAKE 1 CAPSULE BY MOUTH ONE TIME PER WEEK         Discharge Exam: Filed Weights   08/06/24 1645 08/09/24 1405  Weight: 116.3 kg 119.3 kg    GENERAL:  Alert, pleasant, no acute distress  HEENT:  EOMI CARDIOVASCULAR:  RRR, no murmurs appreciated RESPIRATORY:  Clear to auscultation, no wheezing, rales, or rhonchi GASTROINTESTINAL:  Soft, nontender, nondistended EXTREMITIES:  No LE edema bilaterally NEURO:  No new focal deficits appreciated SKIN:  No rashes noted PSYCH:  Appropriate mood and affect     Condition at discharge: improving  The results of significant diagnostics from this hospitalization (including imaging, microbiology, ancillary and laboratory) are listed below for reference.   Imaging Studies: DG Abd Portable 1V-Small Bowel Obstruction Protocol-initial, 8 hr delay Result Date: 08/08/2024 EXAM: 1 VIEW XRAY OF THE ABDOMEN 08/08/2024 06:02:00 PM COMPARISON: X-ray abdomen 08/08/2024 at 2:36 AM. CLINICAL HISTORY: 8 hr delay for SBO - pt has had multiple BM's since contrast and had to go during imaging. FINDINGS: LINES, TUBES AND DEVICES: Enteric tube courses below the hemidiaphragm with the tip and side port overlying the expected region of the  gastric lumen. BOWEL: PO contrast reaches the rectum. Nonobstructive bowel gas pattern. SOFT TISSUES: No opaque urinary calculi. BONES: No acute osseous abnormality. IMPRESSION: 1. Enteric tube appropriately positioned. 2. PO contrast reaches the rectum. Electronically signed by: Morgane Naveau MD 08/08/2024 09:35 PM EDT RP Workstation: [id number]   DG Abd 1 View Result Date: 08/08/2024 EXAM: 1 VIEW XRAY OF THE ABDOMEN 08/08/2024 02:36:00 AM COMPARISON: 08/07/2024 CLINICAL HISTORY: Encounter for imaging study to confirm nasogastric (NG) tube placement FINDINGS: BOWEL: Nonobstructive bowel gas pattern. SOFT TISSUES: Enteric tube in place with distal tip terminating within the proximal stomach. Side port in distal esophagus. BONES: No acute osseous abnormality. IMPRESSION: 1. Enteric tube in place with distal tip terminating within the proximal stomach and side port in distal esophagus. Advancement of the catheter by 10 cm would more optimally positioned device. Electronically signed by: Dorethia Molt MD 08/08/2024 02:40 AM EDT RP Workstation: [id number]   DG Abd 2 Views Result Date: 08/07/2024 EXAM: 2 VIEW XRAY OF THE ABDOMEN 08/07/2024 10:28:00 AM COMPARISON: 08/06/2024 CLINICAL HISTORY: FINDINGS: LINES, TUBES AND DEVICES: Enteric tube in place with tip and  side port projecting over the gastric fundus. BOWEL: Nonobstructive bowel gas pattern. Interval resolution of previous central small bowel dilatation. SOFT TISSUES: No opaque urinary calculi. BONES: No acute osseous abnormality. IMPRESSION: 1. Interval resolution of previous central small bowel dilatation. Electronically signed by: Waddell Calk MD 08/07/2024 12:55 PM EDT RP Workstation: [id number]   DG Abd Portable 1V Result Date: 08/06/2024 CLINICAL DATA:  Small bowel obstruction.  NG tube placement. EXAM: PORTABLE ABDOMEN - 1 VIEW COMPARISON:  Earlier today FINDINGS: Tip and side port of the enteric tube below the diaphragm in the stomach. Small  bowel distension on exams earlier today is not included in the field of view. IMPRESSION: Tip and side port of the enteric tube below the diaphragm in the stomach. Electronically Signed   By: Andrea Gasman M.D.   On: 08/06/2024 18:33   DG Abd Portable 1V-Small Bowel Protocol-Position Verification Result Date: 08/06/2024 CLINICAL DATA:  Nasogastric tube placement. Abdominal pain and vomiting. EXAM: PORTABLE ABDOMEN - 1 VIEW COMPARISON:  CT earlier today FINDINGS: Imaging obtained of the abdomen and pelvis. Nasogastric tube is not seen on provided views. Gaseous small bowel distension corresponding to small-bowel obstruction on earlier CT. Excreted IV contrast in the renal collecting systems and bladder. IMPRESSION: 1. Nasogastric tube is not seen on provided views. Consider chest radiograph. 2. Gaseous small bowel distension corresponding to small-bowel obstruction on earlier CT. Electronically Signed   By: Andrea Gasman M.D.   On: 08/06/2024 14:03   CT ABDOMEN PELVIS W CONTRAST Result Date: 08/06/2024 CLINICAL DATA:  Acute abdominal pain. Vomiting. Patient reports being told last night in the ER she has bowel obstruction, she wants a second opinion. EXAM: CT ABDOMEN AND PELVIS WITH CONTRAST TECHNIQUE: Multidetector CT imaging of the abdomen and pelvis was performed using the standard protocol following bolus administration of intravenous contrast. RADIATION DOSE REDUCTION: This exam was performed according to the departmental dose-optimization program which includes automated exposure control, adjustment of the mA and/or kV according to patient size and/or use of iterative reconstruction technique. CONTRAST:  OMNIPAQUE  IOHEXOL  300 MG/ML  SOLN COMPARISON:  Noncontrast CT 05/09/2021 FINDINGS: Lower chest: Heterogeneous pulmonary parenchyma. No pleural effusion or confluent opacity. 4 mm subpleural right lower lobe nodule, series 6, image 17, unchanged from 2022 and considered benign. No further  imaging follow-up is needed. Hepatobiliary: The liver is enlarged spanning 21.8 cm cranial caudal with diffuse steatosis. No focal liver abnormality. Gallbladder physiologically distended, no calcified stone. No biliary dilatation. Pancreas: No ductal dilatation or inflammation. Spleen: Upper normal in size, stable from prior exam. AP dimension 14.3 cm, 12.5 cm cranial caudal. No focal splenic abnormality. Adrenals/Urinary Tract: No adrenal nodule. No hydronephrosis or perinephric edema. No renal calculi or focal lesion. Urinary bladder is physiologically distended without wall thickening. Stomach/Bowel: Dilated fluid-filled small bowel with fecalization of small bowel contents. Transition from dilated to nondilated in the right lower quadrant, series 2, image 76. The more distal small bowel is decompressed. Mild mesenteric edema adjacent to the dilated small bowel segments. No bowel pneumatosis. The appendix is normal. Moderate volume of stool in the colon. Minimal sigmoid colonic diverticulosis without diverticulitis. Vascular/Lymphatic: Aorto bi-iliac atherosclerosis. Patent portal, splenic, and mesenteric veins. No portal venous or mesenteric gas. No enlarged lymph nodes in the abdomen or pelvis. Reproductive: Bulbous uterus with calcified fibroids. No adnexal mass. Other: Minimal mesenteric free fluid but no significant abdominopelvic ascites. No free air. No abdominal wall hernia. Musculoskeletal: There are no acute or suspicious osseous abnormalities. Degenerative change  in the lumbar spine and hips. IMPRESSION: 1. Small bowel obstruction with transition point in the right lower quadrant, likely due to adhesions. 2. Hepatomegaly and hepatic steatosis. 3. Minimal sigmoid colonic diverticulosis without diverticulitis. 4. Uterine fibroids. Aortic Atherosclerosis (ICD10-I70.0). Electronically Signed   By: Andrea Gasman M.D.   On: 08/06/2024 12:28   MM 3D SCREENING MAMMOGRAM BILATERAL BREAST Result Date:  07/29/2024 CLINICAL DATA:  Screening. EXAM: DIGITAL SCREENING BILATERAL MAMMOGRAM WITH TOMOSYNTHESIS AND CAD TECHNIQUE: Bilateral screening digital craniocaudal and mediolateral oblique mammograms were obtained. Bilateral screening digital breast tomosynthesis was performed. The images were evaluated with computer-aided detection. COMPARISON:  Previous exam(s). ACR Breast Density Category b: There are scattered areas of fibroglandular density. FINDINGS: There are no findings suspicious for malignancy. IMPRESSION: No mammographic evidence of malignancy. A result letter of this screening mammogram will be mailed directly to the patient. RECOMMENDATION: Screening mammogram in one year. (Code:SM-B-01Y) BI-RADS CATEGORY  1: Negative. Electronically Signed   By: Alm Parkins M.D.   On: 07/29/2024 10:31    Microbiology: Results for orders placed or performed in visit on 04/23/17  TECHNOLOGIST REVIEW     Status: None   Collection Time: 04/23/17  8:25 AM  Result Value Ref Range Status   Technologist Review Variant lymphs present  Final    Labs: CBC: Recent Labs  Lab 08/06/24 0911 08/07/24 0702 08/08/24 0612 08/09/24 0609 08/10/24 0632  WBC 8.3 5.1 6.9 7.6 5.3  NEUTROABS 6.7  --   --   --   --   HGB 13.9 12.4 12.7 12.6 11.2*  HCT 43.2 40.9 41.6 43.1 35.7*  MCV 79.3* 82.1 83.7 85.9 81.9  PLT 218 197 187 203 160   Basic Metabolic Panel: Recent Labs  Lab 08/06/24 0911 08/07/24 0702 08/08/24 0612 08/09/24 0609 08/10/24 0632  NA 142 150* 151*  150* 149* 140  K 4.8 4.1 3.8  3.8 3.6 3.2*  CL 104 114* 112*  112* 113* 105  CO2 24 27 26  27 24 26   GLUCOSE 340* 162* 117*  120* 122* 94  BUN 17 11 7  7 6  <5*  CREATININE 0.85 0.65 0.64  0.64 0.63 0.56  CALCIUM  9.6 9.0 9.0  9.1 9.1 8.6*  MG  --  2.6* 2.2  --   --   PHOS  --   --  2.6 2.9 3.0   Liver Function Tests: Recent Labs  Lab 08/06/24 0911 08/08/24 0612 08/09/24 0609 08/10/24 0632  AST 20  --   --   --   ALT 26  --   --    --   ALKPHOS 89  --   --   --   BILITOT 0.6  --   --   --   PROT 6.9  --   --   --   ALBUMIN 4.3 3.9 3.8 3.3*   CBG: Recent Labs  Lab 08/09/24 1617 08/09/24 2015 08/10/24 0007 08/10/24 0424 08/10/24 0744  GLUCAP 94 154* 129* 88 109*    Discharge time spent: 28 minutes.  Length of inpatient stay: 4 days  Signed: Carliss LELON Canales, DO [facility] 08/10/2024

## 2024-08-10 NOTE — Plan of Care (Signed)
  Problem: Education: Goal: Ability to describe self-care measures that may prevent or decrease complications (Diabetes Survival Skills Education) will improve Outcome: Adequate for Discharge Goal: Individualized Educational Video(s) Outcome: Adequate for Discharge   Problem: Coping: Goal: Ability to adjust to condition or change in health will improve Outcome: Adequate for Discharge   Problem: Fluid Volume: Goal: Ability to maintain a balanced intake and output will improve Outcome: Adequate for Discharge   Problem: Health Behavior/Discharge Planning: Goal: Ability to identify and utilize available resources and services will improve Outcome: Adequate for Discharge Goal: Ability to manage health-related needs will improve Outcome: Adequate for Discharge   Problem: Metabolic: Goal: Ability to maintain appropriate glucose levels will improve Outcome: Adequate for Discharge   Problem: Nutritional: Goal: Progress toward achieving an optimal weight will improve Outcome: Adequate for Discharge   Problem: Education: Goal: Knowledge of General Education information will improve Description: Including pain rating scale, medication(s)/side effects and non-pharmacologic comfort measures Outcome: Adequate for Discharge   Problem: Health Behavior/Discharge Planning: Goal: Ability to manage health-related needs will improve Outcome: Adequate for Discharge   Problem: Clinical Measurements: Goal: Ability to maintain clinical measurements within normal limits will improve Outcome: Adequate for Discharge Goal: Will remain free from infection Outcome: Adequate for Discharge Goal: Diagnostic test results will improve Outcome: Adequate for Discharge Goal: Respiratory complications will improve Outcome: Adequate for Discharge   Problem: Activity: Goal: Risk for activity intolerance will decrease Outcome: Adequate for Discharge   Problem: Coping: Goal: Level of anxiety will  decrease Outcome: Adequate for Discharge   Problem: Elimination: Goal: Will not experience complications related to bowel motility Outcome: Adequate for Discharge   Problem: Pain Managment: Goal: General experience of comfort will improve and/or be controlled Outcome: Adequate for Discharge   Problem: Safety: Goal: Ability to remain free from injury will improve Outcome: Adequate for Discharge
